# Patient Record
Sex: Female | Born: 1955
Health system: Southern US, Community
[De-identification: ages and names within clinical notes are randomized; demographics above are authoritative.]

## PROBLEM LIST (undated history)

## (undated) DIAGNOSIS — H04129 Dry eye syndrome of unspecified lacrimal gland: Secondary | ICD-10-CM

## (undated) DIAGNOSIS — F411 Generalized anxiety disorder: Secondary | ICD-10-CM

## (undated) DIAGNOSIS — M949 Disorder of cartilage, unspecified: Secondary | ICD-10-CM

## (undated) DIAGNOSIS — F3289 Other specified depressive episodes: Secondary | ICD-10-CM

## (undated) DIAGNOSIS — M899 Disorder of bone, unspecified: Secondary | ICD-10-CM

## (undated) DIAGNOSIS — C4491 Basal cell carcinoma of skin, unspecified: Secondary | ICD-10-CM

## (undated) DIAGNOSIS — Z8719 Personal history of other diseases of the digestive system: Secondary | ICD-10-CM

## (undated) DIAGNOSIS — D649 Anemia, unspecified: Secondary | ICD-10-CM

## (undated) DIAGNOSIS — D369 Benign neoplasm, unspecified site: Secondary | ICD-10-CM

## (undated) DIAGNOSIS — F329 Major depressive disorder, single episode, unspecified: Secondary | ICD-10-CM

## (undated) DIAGNOSIS — A0472 Enterocolitis due to Clostridium difficile, not specified as recurrent: Secondary | ICD-10-CM

## (undated) DIAGNOSIS — E785 Hyperlipidemia, unspecified: Secondary | ICD-10-CM

## (undated) DIAGNOSIS — K5090 Crohn's disease, unspecified, without complications: Secondary | ICD-10-CM

## (undated) DIAGNOSIS — F1010 Alcohol abuse, uncomplicated: Secondary | ICD-10-CM

## (undated) DIAGNOSIS — E039 Hypothyroidism, unspecified: Secondary | ICD-10-CM

## (undated) DIAGNOSIS — F1021 Alcohol dependence, in remission: Secondary | ICD-10-CM

## (undated) DIAGNOSIS — N823 Fistula of vagina to large intestine: Secondary | ICD-10-CM

## (undated) DIAGNOSIS — M810 Age-related osteoporosis without current pathological fracture: Secondary | ICD-10-CM

## (undated) HISTORY — DX: Alcohol dependence, in remission: F10.21

## (undated) HISTORY — DX: Generalized anxiety disorder: F41.1

## (undated) HISTORY — DX: Alcohol abuse, uncomplicated: F10.10

## (undated) HISTORY — DX: Crohn's disease, unspecified, without complications: K50.90

## (undated) HISTORY — DX: Personal history of other diseases of the digestive system: Z87.19

## (undated) HISTORY — PX: HEMORRHOID SURGERY: SHX153

## (undated) HISTORY — PX: ANAL FISSURE REPAIR: SHX2312

## (undated) HISTORY — DX: Other specified depressive episodes: F32.89

## (undated) HISTORY — PX: OTHER SURGICAL HISTORY: SHX169

## (undated) HISTORY — PX: CATARACT EXTRACTION: SUR2

## (undated) HISTORY — PX: TOTAL HIP ARTHROPLASTY: SHX124

## (undated) HISTORY — DX: Enterocolitis due to Clostridium difficile, not specified as recurrent: A04.72

## (undated) HISTORY — DX: Disorder of bone, unspecified: M89.9

## (undated) HISTORY — DX: Hypothyroidism, unspecified: E03.9

## (undated) HISTORY — DX: Disorder of cartilage, unspecified: M94.9

## (undated) HISTORY — DX: Basal cell carcinoma of skin, unspecified: C44.91

## (undated) HISTORY — DX: Age-related osteoporosis without current pathological fracture: M81.0

## (undated) HISTORY — DX: Dry eye syndrome of unspecified lacrimal gland: H04.129

## (undated) HISTORY — DX: Benign neoplasm, unspecified site: D36.9

## (undated) HISTORY — DX: Hyperlipidemia, unspecified: E78.5

## (undated) HISTORY — DX: Anemia, unspecified: D64.9

## (undated) HISTORY — DX: Fistula of vagina to large intestine: N82.3

## (undated) HISTORY — DX: Major depressive disorder, single episode, unspecified: F32.9

---

## 1987-10-13 HISTORY — PX: OTHER SURGICAL HISTORY: SHX169

## 1998-06-20 ENCOUNTER — Ambulatory Visit (HOSPITAL_COMMUNITY): Admission: RE | Admit: 1998-06-20 | Discharge: 1998-06-20 | Payer: Self-pay | Admitting: Internal Medicine

## 1998-12-24 ENCOUNTER — Other Ambulatory Visit: Admission: RE | Admit: 1998-12-24 | Discharge: 1998-12-24 | Payer: Self-pay | Admitting: Emergency Medicine

## 1999-12-29 ENCOUNTER — Other Ambulatory Visit: Admission: RE | Admit: 1999-12-29 | Discharge: 1999-12-29 | Payer: Self-pay | Admitting: Emergency Medicine

## 2000-01-02 ENCOUNTER — Encounter: Payer: Self-pay | Admitting: Emergency Medicine

## 2000-01-02 ENCOUNTER — Encounter: Admission: RE | Admit: 2000-01-02 | Discharge: 2000-01-02 | Payer: Self-pay | Admitting: Emergency Medicine

## 2000-01-12 ENCOUNTER — Encounter: Admission: RE | Admit: 2000-01-12 | Discharge: 2000-01-12 | Payer: Self-pay | Admitting: Emergency Medicine

## 2000-01-12 ENCOUNTER — Encounter: Payer: Self-pay | Admitting: Emergency Medicine

## 2002-05-31 ENCOUNTER — Ambulatory Visit (HOSPITAL_COMMUNITY): Admission: RE | Admit: 2002-05-31 | Discharge: 2002-05-31 | Payer: Self-pay | Admitting: Internal Medicine

## 2002-05-31 ENCOUNTER — Encounter: Payer: Self-pay | Admitting: Internal Medicine

## 2002-07-31 ENCOUNTER — Encounter: Payer: Self-pay | Admitting: Emergency Medicine

## 2002-07-31 ENCOUNTER — Encounter: Admission: RE | Admit: 2002-07-31 | Discharge: 2002-07-31 | Payer: Self-pay | Admitting: Emergency Medicine

## 2002-08-21 ENCOUNTER — Encounter: Payer: Self-pay | Admitting: Internal Medicine

## 2004-07-21 ENCOUNTER — Encounter: Admission: RE | Admit: 2004-07-21 | Discharge: 2004-07-21 | Payer: Self-pay | Admitting: Emergency Medicine

## 2005-05-03 ENCOUNTER — Emergency Department (HOSPITAL_COMMUNITY): Admission: EM | Admit: 2005-05-03 | Discharge: 2005-05-03 | Payer: Self-pay | Admitting: Emergency Medicine

## 2005-05-04 ENCOUNTER — Ambulatory Visit: Payer: Self-pay | Admitting: Psychiatry

## 2005-05-05 ENCOUNTER — Other Ambulatory Visit (HOSPITAL_COMMUNITY): Admission: RE | Admit: 2005-05-05 | Discharge: 2005-05-28 | Payer: Self-pay | Admitting: Psychiatry

## 2005-06-16 ENCOUNTER — Ambulatory Visit: Payer: Self-pay | Admitting: Internal Medicine

## 2005-06-19 ENCOUNTER — Ambulatory Visit: Payer: Self-pay | Admitting: Cardiology

## 2005-07-02 ENCOUNTER — Ambulatory Visit: Payer: Self-pay | Admitting: Internal Medicine

## 2005-07-30 ENCOUNTER — Ambulatory Visit: Payer: Self-pay | Admitting: Internal Medicine

## 2005-09-02 ENCOUNTER — Ambulatory Visit: Payer: Self-pay | Admitting: Internal Medicine

## 2005-11-05 ENCOUNTER — Ambulatory Visit: Payer: Self-pay | Admitting: Internal Medicine

## 2006-01-22 ENCOUNTER — Ambulatory Visit: Payer: Self-pay | Admitting: Internal Medicine

## 2006-02-08 ENCOUNTER — Ambulatory Visit: Payer: Self-pay | Admitting: Internal Medicine

## 2006-03-09 ENCOUNTER — Ambulatory Visit: Payer: Self-pay | Admitting: Internal Medicine

## 2006-03-12 ENCOUNTER — Inpatient Hospital Stay (HOSPITAL_COMMUNITY): Admission: AD | Admit: 2006-03-12 | Discharge: 2006-03-15 | Payer: Self-pay | Admitting: Internal Medicine

## 2006-03-12 ENCOUNTER — Ambulatory Visit: Payer: Self-pay | Admitting: Internal Medicine

## 2006-03-17 ENCOUNTER — Ambulatory Visit: Payer: Self-pay | Admitting: Internal Medicine

## 2006-03-18 ENCOUNTER — Ambulatory Visit: Payer: Self-pay | Admitting: Internal Medicine

## 2006-03-24 ENCOUNTER — Encounter (HOSPITAL_COMMUNITY): Admission: RE | Admit: 2006-03-24 | Discharge: 2006-06-22 | Payer: Self-pay | Admitting: Internal Medicine

## 2006-05-07 ENCOUNTER — Ambulatory Visit: Payer: Self-pay | Admitting: Internal Medicine

## 2006-06-29 ENCOUNTER — Encounter (HOSPITAL_COMMUNITY): Admission: RE | Admit: 2006-06-29 | Discharge: 2006-09-27 | Payer: Self-pay | Admitting: Internal Medicine

## 2006-08-10 ENCOUNTER — Ambulatory Visit: Payer: Self-pay | Admitting: Internal Medicine

## 2006-08-30 ENCOUNTER — Ambulatory Visit: Payer: Self-pay | Admitting: Internal Medicine

## 2006-08-30 ENCOUNTER — Encounter: Payer: Self-pay | Admitting: Internal Medicine

## 2006-09-01 ENCOUNTER — Encounter: Admission: RE | Admit: 2006-09-01 | Discharge: 2006-09-01 | Payer: Self-pay | Admitting: Emergency Medicine

## 2006-10-12 HISTORY — PX: MOHS SURGERY: SUR867

## 2006-11-16 ENCOUNTER — Encounter: Admission: RE | Admit: 2006-11-16 | Discharge: 2006-11-16 | Payer: Self-pay | Admitting: Rheumatology

## 2007-01-19 ENCOUNTER — Encounter: Payer: Self-pay | Admitting: Internal Medicine

## 2007-01-25 ENCOUNTER — Ambulatory Visit: Payer: Self-pay | Admitting: Internal Medicine

## 2007-04-27 ENCOUNTER — Encounter: Payer: Self-pay | Admitting: Internal Medicine

## 2007-05-23 ENCOUNTER — Ambulatory Visit: Payer: Self-pay | Admitting: Internal Medicine

## 2007-05-23 LAB — CONVERTED CEMR LAB
Albumin: 3.7 g/dL (ref 3.5–5.2)
Bilirubin, Direct: 0.2 mg/dL (ref 0.0–0.3)

## 2007-08-10 ENCOUNTER — Encounter: Payer: Self-pay | Admitting: Internal Medicine

## 2007-08-10 LAB — CONVERTED CEMR LAB
Cholesterol: 234 mg/dL
LDL (calc): 92 mg/dL
Platelets: 272 10*3/uL
WBC: 1.9 10*3/uL

## 2007-10-26 ENCOUNTER — Encounter: Admission: RE | Admit: 2007-10-26 | Discharge: 2007-10-26 | Payer: Self-pay | Admitting: Emergency Medicine

## 2007-10-26 ENCOUNTER — Encounter: Payer: Self-pay | Admitting: Internal Medicine

## 2008-07-30 DIAGNOSIS — F411 Generalized anxiety disorder: Secondary | ICD-10-CM

## 2008-07-30 DIAGNOSIS — F1021 Alcohol dependence, in remission: Secondary | ICD-10-CM | POA: Insufficient documentation

## 2008-07-30 DIAGNOSIS — IMO0002 Reserved for concepts with insufficient information to code with codable children: Secondary | ICD-10-CM | POA: Insufficient documentation

## 2008-07-30 DIAGNOSIS — F32A Depression, unspecified: Secondary | ICD-10-CM | POA: Insufficient documentation

## 2008-07-30 DIAGNOSIS — E039 Hypothyroidism, unspecified: Secondary | ICD-10-CM | POA: Insufficient documentation

## 2008-07-30 DIAGNOSIS — I10 Essential (primary) hypertension: Secondary | ICD-10-CM | POA: Insufficient documentation

## 2008-07-30 DIAGNOSIS — M810 Age-related osteoporosis without current pathological fracture: Secondary | ICD-10-CM | POA: Insufficient documentation

## 2008-07-30 DIAGNOSIS — M8580 Other specified disorders of bone density and structure, unspecified site: Secondary | ICD-10-CM | POA: Insufficient documentation

## 2008-07-30 DIAGNOSIS — E781 Pure hyperglyceridemia: Secondary | ICD-10-CM | POA: Insufficient documentation

## 2008-07-30 DIAGNOSIS — F419 Anxiety disorder, unspecified: Secondary | ICD-10-CM | POA: Insufficient documentation

## 2008-07-30 DIAGNOSIS — E785 Hyperlipidemia, unspecified: Secondary | ICD-10-CM | POA: Insufficient documentation

## 2008-07-30 DIAGNOSIS — F329 Major depressive disorder, single episode, unspecified: Secondary | ICD-10-CM

## 2008-07-30 DIAGNOSIS — N281 Cyst of kidney, acquired: Secondary | ICD-10-CM | POA: Insufficient documentation

## 2008-07-30 DIAGNOSIS — D649 Anemia, unspecified: Secondary | ICD-10-CM | POA: Insufficient documentation

## 2008-08-30 ENCOUNTER — Encounter: Payer: Self-pay | Admitting: Internal Medicine

## 2008-08-31 ENCOUNTER — Ambulatory Visit: Payer: Self-pay | Admitting: Internal Medicine

## 2008-08-31 DIAGNOSIS — Z8719 Personal history of other diseases of the digestive system: Secondary | ICD-10-CM | POA: Insufficient documentation

## 2008-09-03 ENCOUNTER — Encounter: Payer: Self-pay | Admitting: Internal Medicine

## 2008-09-03 LAB — CONVERTED CEMR LAB
ALT: 29 units/L (ref 0–35)
Alkaline Phosphatase: 74 units/L (ref 39–117)
BUN: 18 mg/dL (ref 6–23)
Basophils Absolute: 0.1 10*3/uL (ref 0.0–0.1)
CO2: 32 meq/L (ref 19–32)
Calcium: 9.9 mg/dL (ref 8.4–10.5)
Creatinine, Ser: 0.5 mg/dL (ref 0.4–1.2)
Eosinophils Relative: 1.2 % (ref 0.0–5.0)
GFR calc Af Amer: 167 mL/min
HCT: 35.4 % — ABNORMAL LOW (ref 36.0–46.0)
Iron: 146 ug/dL — ABNORMAL HIGH (ref 42–145)
Lymphocytes Relative: 15.7 % (ref 12.0–46.0)
Monocytes Absolute: 0.3 10*3/uL (ref 0.1–1.0)
Neutro Abs: 3.3 10*3/uL (ref 1.4–7.7)
Neutrophils Relative %: 73.9 % (ref 43.0–77.0)
Potassium: 4.4 meq/L (ref 3.5–5.1)
RDW: 13.2 % (ref 11.5–14.6)
Saturation Ratios: 36.5 % (ref 20.0–50.0)
Sodium: 138 meq/L (ref 135–145)
Total Protein: 7 g/dL (ref 6.0–8.3)
Transferrin: 285.6 mg/dL (ref >=212.0)

## 2008-09-19 ENCOUNTER — Ambulatory Visit (HOSPITAL_COMMUNITY): Admission: RE | Admit: 2008-09-19 | Discharge: 2008-09-19 | Payer: Self-pay | Admitting: Internal Medicine

## 2009-05-15 ENCOUNTER — Telehealth: Payer: Self-pay | Admitting: Internal Medicine

## 2009-05-16 ENCOUNTER — Telehealth: Payer: Self-pay | Admitting: Internal Medicine

## 2009-05-27 ENCOUNTER — Ambulatory Visit: Payer: Self-pay | Admitting: Internal Medicine

## 2009-05-27 ENCOUNTER — Telehealth: Payer: Self-pay | Admitting: Internal Medicine

## 2009-05-27 DIAGNOSIS — K5090 Crohn's disease, unspecified, without complications: Secondary | ICD-10-CM | POA: Insufficient documentation

## 2009-05-28 LAB — CONVERTED CEMR LAB
ALT: 18 units/L (ref 0–35)
AST: 21 units/L (ref 0–37)
Alkaline Phosphatase: 223 units/L — ABNORMAL HIGH (ref 39–117)
Basophils Absolute: 0 10*3/uL (ref 0.0–0.1)
Basophils Relative: 0.4 % (ref 0.0–3.0)
CRP, High Sensitivity: >80 — ABNORMAL HIGH (ref 0.00–5.00)
Chloride: 102 meq/L (ref 96–112)
Eosinophils Relative: 0.9 % (ref 0.0–5.0)
GFR calc non Af Amer: 79.75 mL/min (ref >60)
Glucose, Bld: 114 mg/dL — ABNORMAL HIGH (ref 70–99)
HCT: 39.1 % (ref 36.0–46.0)
Hemoglobin: 13.5 g/dL (ref 12.0–15.0)
Neutro Abs: 6.4 10*3/uL (ref 1.4–7.7)
Neutrophils Relative %: 73.3 % (ref 43.0–77.0)
Platelets: 456 10*3/uL — ABNORMAL HIGH (ref 150.0–400.0)
Total Protein: 7.7 g/dL (ref 6.0–8.3)

## 2009-06-20 ENCOUNTER — Ambulatory Visit: Payer: Self-pay | Admitting: Internal Medicine

## 2009-07-10 ENCOUNTER — Ambulatory Visit: Payer: Self-pay | Admitting: Internal Medicine

## 2009-07-16 ENCOUNTER — Ambulatory Visit: Payer: Self-pay | Admitting: Internal Medicine

## 2009-07-18 ENCOUNTER — Encounter: Payer: Self-pay | Admitting: Internal Medicine

## 2009-07-18 LAB — CONVERTED CEMR LAB
Cholesterol: 340 mg/dL — ABNORMAL HIGH (ref 0–200)
Direct LDL: 174.5 mg/dL
TSH: 0.08 microintl units/mL — ABNORMAL LOW (ref 0.35–5.50)
Total CHOL/HDL Ratio: 3

## 2009-07-29 ENCOUNTER — Encounter: Payer: Self-pay | Admitting: Internal Medicine

## 2009-08-05 ENCOUNTER — Telehealth: Payer: Self-pay | Admitting: Internal Medicine

## 2009-08-15 ENCOUNTER — Ambulatory Visit: Payer: Self-pay | Admitting: Internal Medicine

## 2009-08-16 ENCOUNTER — Encounter: Admission: RE | Admit: 2009-08-16 | Discharge: 2009-08-16 | Payer: Self-pay | Admitting: Internal Medicine

## 2009-08-28 ENCOUNTER — Ambulatory Visit: Payer: Self-pay | Admitting: Internal Medicine

## 2009-08-28 LAB — CONVERTED CEMR LAB: TSH: 3.22 microintl units/mL (ref 0.35–5.50)

## 2009-08-30 ENCOUNTER — Telehealth: Payer: Self-pay | Admitting: Internal Medicine

## 2009-08-30 ENCOUNTER — Encounter: Payer: Self-pay | Admitting: Internal Medicine

## 2009-10-16 ENCOUNTER — Ambulatory Visit: Payer: Self-pay | Admitting: Internal Medicine

## 2009-12-27 ENCOUNTER — Encounter (INDEPENDENT_AMBULATORY_CARE_PROVIDER_SITE_OTHER): Payer: Self-pay | Admitting: *Deleted

## 2010-02-14 ENCOUNTER — Ambulatory Visit: Payer: Self-pay | Admitting: Internal Medicine

## 2010-02-14 DIAGNOSIS — M25569 Pain in unspecified knee: Secondary | ICD-10-CM | POA: Insufficient documentation

## 2010-02-17 LAB — CONVERTED CEMR LAB: TSH: 8.59 microintl units/mL — ABNORMAL HIGH (ref 0.35–5.50)

## 2010-10-12 DIAGNOSIS — N823 Fistula of vagina to large intestine: Secondary | ICD-10-CM

## 2010-10-12 HISTORY — DX: Fistula of vagina to large intestine: N82.3

## 2010-10-22 ENCOUNTER — Ambulatory Visit
Admission: RE | Admit: 2010-10-22 | Discharge: 2010-10-22 | Payer: Self-pay | Source: Home / Self Care | Attending: Internal Medicine | Admitting: Internal Medicine

## 2010-10-22 ENCOUNTER — Other Ambulatory Visit: Payer: Self-pay | Admitting: Internal Medicine

## 2010-10-22 LAB — CBC WITH DIFFERENTIAL/PLATELET
Basophils Absolute: 0 10*3/uL (ref 0.0–0.1)
Basophils Relative: 1 % (ref 0.0–3.0)
Eosinophils Absolute: 0.1 10*3/uL (ref 0.0–0.7)
Eosinophils Relative: 1.9 % (ref 0.0–5.0)
HCT: 39.3 % (ref 36.0–46.0)
Hemoglobin: 13.8 g/dL (ref 12.0–15.0)
Lymphocytes Relative: 29.4 % (ref 12.0–46.0)
Lymphs Abs: 0.8 10*3/uL (ref 0.7–4.0)
MCHC: 35.2 g/dL (ref 30.0–36.0)
MCV: 111.4 fl — ABNORMAL HIGH (ref 78.0–100.0)
Monocytes Absolute: 0.3 10*3/uL (ref 0.1–1.0)
Monocytes Relative: 10 % (ref 3.0–12.0)
Neutro Abs: 1.5 10*3/uL (ref 1.4–7.7)
Neutrophils Relative %: 57.7 % (ref 43.0–77.0)
Platelets: 286 10*3/uL (ref 150.0–400.0)
RBC: 3.53 Mil/uL — ABNORMAL LOW (ref 3.87–5.11)
RDW: 13.7 % (ref 11.5–14.6)
WBC: 2.6 10*3/uL — ABNORMAL LOW (ref 4.5–10.5)

## 2010-10-22 LAB — COMPREHENSIVE METABOLIC PANEL
ALT: 25 U/L (ref 0–35)
AST: 44 U/L — ABNORMAL HIGH (ref 0–37)
Albumin: 4.3 g/dL (ref 3.5–5.2)
Alkaline Phosphatase: 61 U/L (ref 39–117)
BUN: 12 mg/dL (ref 6–23)
CO2: 30 mEq/L (ref 19–32)
Calcium: 10.3 mg/dL (ref 8.4–10.5)
Chloride: 105 mEq/L (ref 96–112)
Creatinine, Ser: 0.6 mg/dL (ref 0.4–1.2)
GFR: 122.24 mL/min (ref >60.00)
Glucose, Bld: 98 mg/dL (ref 70–99)
Potassium: 4.7 mEq/L (ref 3.5–5.1)
Sodium: 143 mEq/L (ref 135–145)
Total Bilirubin: 0.8 mg/dL (ref 0.3–1.2)
Total Protein: 7.4 g/dL (ref 6.0–8.3)

## 2010-10-22 LAB — SEDIMENTATION RATE: Sed Rate: 20 mm/hr (ref 0–22)

## 2010-10-22 LAB — VITAMIN B12: Vitamin B-12: 836 pg/mL (ref 211–911)

## 2010-11-11 NOTE — Assessment & Plan Note (Signed)
Summary: F/U OV...LSW.    History of Present Illness Visit Type: Follow-up Visit Primary GI MD: Delfin Edis MD Primary Provider: Rowe Clack MD Requesting Provider: NA Chief Complaint: F/U Crohn's flare, patient has no problems now History of Present Illness:   This is a 55 year old white female with known Crohn's disease of the rectum, colon and terminal ileum status post flareup in September 2010 which responded to a prednisone taper. She continues Imuran 100 mg daily and discontinued her prednisone one week ago. She a questionable rectovaginal fistula which has been rather stable and is not currently a problem. She has no complaints today.  Her ast appointment with [place] was on 08/15/09. She drinks excess alcohol on a daily basis, drinking two thirds of a bottle of wine daily. She has been constipated.   GI Review of Systems      Denies abdominal pain, acid reflux, belching, bloating, chest pain, dysphagia with liquids, dysphagia with solids, heartburn, loss of appetite, nausea, vomiting, vomiting blood, weight loss, and  weight gain.        Denies anal fissure, black tarry stools, change in bowel habit, constipation, diarrhea, diverticulosis, fecal incontinence, heme positive stool, hemorrhoids, irritable bowel syndrome, jaundice, light color stool, liver problems, rectal bleeding, and  rectal pain.    Current Medications (verified): 1)  Azasan 100 Mg  Tabs (Azathioprine) .... Take 1 Tablet By Mouth Once A Day. 2)  Effexor Xr 150 Mg Xr24h-Cap (Venlafaxine Hcl) .Marland Kitchen.. 1 By Mouth  Every Morning 3)  Valtrex 500 Mg Tabs (Valacyclovir Hcl) .... Once Daily 4)  Boniva 150 Mg Tabs (Ibandronate Sodium) .... Take Every 2-3 Months 5)  Vitamin B-12 500 Mcg Tabs (Cyanocobalamin) .... Once Daily 6)  Chelated Potassium 99 Mg Tabs (Potassium) .... Once Daily 7)  Lorazepam 0.5 Mg Tabs (Lorazepam) .... Take 1 Tablet By Mouth Once Daily As Needed 8)  Levothroid 50 Mcg Tabs (Levothyroxine Sodium) ....  Take 1 By Mouth Qd 9)  Lipitor 20 Mg Tabs (Atorvastatin Calcium) .Marland Kitchen.. 1 By Mouth At Bedtime  Allergies (verified): No Known Drug Allergies  Past History:  Past Medical History: Reviewed history from 07/10/2009 and no changes required. basal cell cancer of the nose RENAL CYST  DEGENERATIVE DISC DISEASE OSTEOPENIA  HYPERTENSION HYPOTHYROIDISM DEPRESSION  ANXIETY DISORDER  ALCOHOL ABUSE, HX OF  HYPERLIPIDEMIA ANEMIA CROHN'S DISEASE   Past Surgical History: Reviewed history from 08/15/2009 and no changes required. anal fissure repair Hemorrhoidectomy theraputic abortion Mohs surg -nose BCC (2008) left thumb surgery nail bed 1989  Review of Systems       The patient complains of back pain, depression-new, and night sweats.         Pertinent positive and negative review of systems were noted in the above HPI. All other ROS was otherwise negative.   Vital Signs:  Patient profile:   55 year old female Height:      60 inches Weight:      116.13 pounds Pulse rate:   72 / minute Pulse rhythm:   regular BP sitting:   112 / 82  (left arm) Cuff size:   regular  Vitals Entered By: June McMurray [place] Deborra Medina) (October 16, 2009 9:45 AM)  Physical Exam  General:  spider nevi on the face. Eyes:  nonicteric. Neck:  Supple; no masses or thyromegaly. Lungs:  Clear throughout to auscultation. Heart:  Regular rate and rhythm; no murmurs, rubs,  or bruits. Abdomen:  soft abdomen which is nontender without normoactive bowel sounds. Liver  edge at costal margin. Right lower quadrant and left lower quadrant unremarkable. Rectal:  deferred. Extremities:  no pedal edema. Neurologic:  fine resting tremor. Skin:  palmar erythema. Psych:  Alert and cooperative. Normal mood and affect.   Impression & Recommendations:  Problem # 1:  CROHN'S DISEASE (ICD-555.9) Patient has Crohn's colitis with rectal disease and terminal ileum involvement. She is currently in remission on Imuran 100 mg  daily. She will continue the same regimen and we will see her in 3 months.  Problem # 2:  ALCOHOL ABUSE, HX OF (ICD-V11.3) Patient continues with excessive alcohol use and apparently has no intention of stopping. The past evaluation of the liver did not show any evidence of hepatitis, portal hypertension or cirrhosis.  Patient Instructions: 1)  continue Imuran 100 mg daily. 2)  Continue Analpram cream 2.5% p.r.n. rectal irritation. 3)  Office visit 3 months. 4)  Copy sent to : Dr Asa Lente

## 2010-11-11 NOTE — Letter (Signed)
Summary: Colonoscopy Date Change Letter  [facility]  [place], [place] [address]   Phone: [phone]  Fax: [phone]      December 27, 2009 MRN: [id number]   MARIANNY GORIS [address] [place], [place]  [address]   Dear Ms. Piano,   Previously you were recommended to have a repeat colonoscopy around this time. Your chart was recently reviewed by Dr. Lowella Bandy. Olevia Perches of [facility]. Follow up colonoscopy is now recommended in April 2012. This revised recommendation is based on current, nationally recognized guidelines for colorectal cancer screening and polyp surveillance. These guidelines are endorsed by the [place] Task Force on Colorectal Cancer as well as numerous other major medical organizations.  Please understand that our recommendation assumes that you do not have any new symptoms such as bleeding, a change in bowel habits, anemia, or significant abdominal discomfort. If you do have any concerning GI symptoms or want to discuss the guideline recommendations, please call to arrange an office visit at your earliest convenience. Otherwise we will keep you in our reminder system and contact you 1-2 months prior to the date listed above to schedule your next colonoscopy.  Thank you,  Lowella Bandy. Olevia Perches, M.D.  [facility] [phone]

## 2010-11-11 NOTE — Assessment & Plan Note (Signed)
Summary: knee problem,thyroid problem/cd   Vital Signs:  Patient profile:   55 year old female Height:      60 inches (152.40 cm) Weight:      123.8 pounds (56.27 kg) O2 Sat:      97 % on Room air Temp:     99.1 degrees F (37.28 degrees C) oral Pulse rate:   95 / minute BP sitting:   110 / 82  (left arm) Cuff size:   regular  Vitals Entered By: Tomma Lightning (Feb 14, 2010 3:40 PM)  O2 Flow:  Room air CC: (L) Knee problem, also want to discuss her thyroid and cholestol. Pt states she stop taking the lipitor due to side effects Is Patient Diabetic? No Pain Assessment Patient in pain? no        Primary Care Provider:  Rowe Clack MD  CC:  (L) Knee problem and also want to discuss her thyroid and cholestol. Pt states she stop taking the lipitor due to side effects.  History of Present Illness: c/o L knee pain - onset 1 year ago  precipitated by fall injury 1 year ago when slipped in shower and twisted it occ swelling, none at thsis time pain flare 1/2 weeks, lasts 2-3 days - pain and swelling improved by ice and elevation - also improved with ibuprofen  dyslipidemia -  intol of crestor - caused constipation which has improved since off med -  also retried on lipitor on 08/2009 visit - also intol of vytorin - repeat lipitor caused constipation   crohns - tapering pred saw GI to manage same - no new bowel symptoms   hypothyroid - dec dose 6 weeks ago as directed due to low TSH - no adv SE - compliant with new dose as rx;'d ?if aromour thyroid is safer or more effectiove  Clinical Review Panels:  Lipid Management   Cholesterol:  340 (07/16/2009)   HDL (good cholesterol):  98.60 (07/16/2009)   Triglycerides:  327 (08/10/2007)   Current Medications (verified): 1)  Azasan 100 Mg  Tabs (Azathioprine) .... Take 1 Tablet By Mouth Once A Day. 2)  Effexor Xr 150 Mg Xr24h-Cap (Venlafaxine Hcl) .Marland Kitchen.. 1 By Mouth  Every Morning 3)  Valtrex 500 Mg Tabs (Valacyclovir Hcl)  .... Once Daily 4)  Boniva 150 Mg Tabs (Ibandronate Sodium) .... Take Every 2-3 Months 5)  Vitamin B-12 500 Mcg Tabs (Cyanocobalamin) .... Once Daily 6)  Chelated Potassium 99 Mg Tabs (Potassium) .... Once Daily 7)  Lorazepam 0.5 Mg Tabs (Lorazepam) .... Take 1 Tablet By Mouth Once Daily As Needed 8)  Levothroid 50 Mcg Tabs (Levothyroxine Sodium) .... Take 1 By Mouth Qd  Allergies (verified): No Known Drug Allergies  Past History:  Past Medical History: basal cell cancer of the nose RENAL CYST  DEGENERATIVE DISC DISEASE OSTEOPENIA  HYPERTENSION HYPOTHYROIDISM DEPRESSION  ANXIETY DISORDER  ALCOHOL ABUSE, HX OF  HYPERLIPIDEMIA ANEMIA CROHN'S DISEASE   MD rooster: GI - brodie  Review of Systems  The patient denies anorexia, fever, headaches, difficulty walking, and depression.    Physical Exam  General:  alert, well-developed but frail; cooperative to examination.    Lungs:  normal respiratory effort, no intercostal retractions or use of accessory muscles; normal breath sounds bilaterally - no crackles and no wheezes.    Heart:  normal rate, regular rhythm, no murmur, and no rub. BLE without edema.  Msk:  left knee: decreased range of motion, diffuse boggy synovitis. Tender to palpation on joint  line. Positive crepitus.    Impression & Recommendations:  Problem # 1:  KNEE PAIN, LEFT (ICD-719.46)  Orders: T-DG Knee Bilateral Standing AP (42876) T-Knee Left 2 view (73560TC)  Discussed strengthening exercises, use of ice or heat, and medications.   Problem # 2:  HYPERLIPIDEMIA (ICD-272.4)  intol of statins - try niacin The following medications were removed from the medication list:    Lipitor 20 Mg Tabs (Atorvastatin calcium) .Marland Kitchen... 1 by mouth at bedtime Her updated medication list for this problem includes:    Niacor 500 Mg Tabs (Niacin) .Marland Kitchen... 1 by mouth at bedtime x 7 days, then 2 by mouth at bedtime (or as directed)  Labs Reviewed: SGOT: 21 (05/27/2009)    SGPT: 18 (05/27/2009)   HDL:98.60 (07/16/2009), 77 (08/10/2007)  LDL:92 (08/10/2007)  Chol:340 (07/16/2009), 234 (08/10/2007)  Trig:617.0 (07/16/2009), 327 (08/10/2007)  Orders: Prescription Created Electronically [phone])  Problem # 3:  HYPOTHYROIDISM (ICD-244.9) consider "natural" thyroid if TSH is not in balance vs endo refer to advise on same Her updated medication list for this problem includes:    Levothroid 50 Mcg Tabs (Levothyroxine sodium) .Marland Kitchen... Take 1 by mouth qd  Orders: TLB-TSH (Thyroid Stimulating Hormone) (84443-TSH)  Labs Reviewed: TSH: 3.22 (08/28/2009)    Chol: 340 (07/16/2009)   HDL: 98.60 (07/16/2009)   LDL: 92 (08/10/2007)   TG: 617.0 (07/16/2009)  Problem # 4:  CROHN'S DISEASE (ICD-555.9)  Patient has Crohn's colitis with rectal disease and terminal ileum involvement. She is currently in remission on Imuran 100 mg daily. She will continue the same regimen and we will see her in 3 months.  Complete Medication List: 1)  Azasan 100 Mg Tabs (Azathioprine) .... Take 1 tablet by mouth once a day. 2)  Effexor Xr 150 Mg Xr24h-cap (Venlafaxine hcl) .Marland Kitchen.. 1 by mouth  every morning 3)  Valtrex 500 Mg Tabs (Valacyclovir hcl) .... Once daily 4)  Boniva 150 Mg Tabs (Ibandronate sodium) .... Take every 2-3 months 5)  Vitamin B-12 500 Mcg Tabs (Cyanocobalamin) .... Once daily 6)  Chelated Potassium 99 Mg Tabs (Potassium) .... Once daily 7)  Lorazepam 0.5 Mg Tabs (Lorazepam) .... Take 1 tablet by mouth once daily as needed 8)  Levothroid 50 Mcg Tabs (Levothyroxine sodium) .... Take 1 by mouth qd 9)  Niacor 500 Mg Tabs (Niacin) .Marland Kitchen.. 1 by mouth at bedtime x 7 days, then 2 by mouth at bedtime (or as directed)  Patient Instructions: 1)  it was good to see you today.  2)  test(s) ordered today - your results will be posted on the phone tree for review in 48-72 hours from the time of test completion; call [phone] and enter your 9 digit MRN (listed above on this page, just below  your name); if any changes need to be made or there are abnormal results, you will be contacted directly.  3)  start Niacor (niacin) for your cholesterol - your prescription has been electronically submitted to your pharmacy. Please take as directed. Contact our office if you believe you're having problems with the medication(s).  4)  Please schedule a follow-up appointment in 3-4 months to review cholesterol, thyroid and knee - call sooner if problems.  Prescriptions: NIACOR 500 MG TABS (NIACIN) 1 by mouth at bedtime x 7 days, then 2 by mouth at bedtime (or as directed)  #60 x 5   Entered and Authorized by:   Rowe Clack MD   Signed by:   Rowe Clack MD on 02/14/2010   Method  used:   Electronically to        [facility] (retail)       [address]       [place], [place]  [address]       Ph: [phone] or [phone]       Fax: [phone]   RxID:   [id number]

## 2010-11-13 NOTE — Assessment & Plan Note (Signed)
Summary: F/U CROHNS..JJ.    History of Present Illness Visit Type: Follow-up Visit Primary GI MD: Delfin Edis MD Primary Provider: Rowe Clack MD Requesting Provider: NA Chief Complaint: F/u for crohn's. Pt c/o constipation  History of Present Illness:   This is 55 year old white female with Crohn's disease of the rectum, colon and terminal ileum of at least 20 years duration. Her last flareup in September 2010 responded to a prednisone taper. She has been on Imuran 100 mg daily and a B12 supplement. She has a questionable rectovaginal fistula which is currently asymptomatic and stable. She took Cipro for it in the past. She drinks a large amount of alcohol daily; about two thirds of the large bottle daily. Other medical problems include degenerative disc disease, hypothyroidism, COPD as well as anxiety and hyperlipidemia. Her last colonoscopy in April 2007 showed rectal granularity and ulcerations and pseudopolyps. She had minimal disease in the terminal ileum with biopsies showing normal ileal mucosa. Biopsies from the rectum confirmed chronic active inflammation. She is due for a colonoscopy in April 2012.   GI Review of Systems      Denies abdominal pain, acid reflux, belching, bloating, chest pain, dysphagia with liquids, dysphagia with solids, heartburn, loss of appetite, nausea, vomiting, vomiting blood, weight loss, and  weight gain.      Reports constipation.     Denies anal fissure, black tarry stools, change in bowel habit, diarrhea, diverticulosis, fecal incontinence, heme positive stool, hemorrhoids, irritable bowel syndrome, jaundice, light color stool, liver problems, rectal bleeding, and  rectal pain.    Current Medications (verified): 1)  Azasan 100 Mg  Tabs (Azathioprine) .... Take 1 Tablet By Mouth Once A Day. 2)  Effexor Xr 150 Mg Xr24h-Cap (Venlafaxine Hcl) .Marland Kitchen.. 1 By Mouth  Every Morning 3)  Valtrex 500 Mg Tabs (Valacyclovir Hcl) .... Once Daily 4)  Boniva 150 Mg  Tabs (Ibandronate Sodium) .... Take Every 2-3 Months 5)  Vitamin B-12 500 Mcg Tabs (Cyanocobalamin) .... Once Daily 6)  Chelated Potassium 99 Mg Tabs (Potassium) .... Once Daily 7)  Lorazepam 0.5 Mg Tabs (Lorazepam) .... Take 1 Tablet By Mouth Once Daily As Needed 8)  Levoxyl 75 Mcg Tabs (Levothyroxine Sodium) .... One Tablet By Mouth Once Daily 9)  Ex-Lax 15 Mg Tabs (Sennosides) .... As Needed  Allergies (verified): No Known Drug Allergies  Past History:  Past Medical History: basal cell cancer of the nose KNEE PAIN, LEFT (ICD-719.46) DRY EYE SYNDROME (ICD-375.15) HYPERLIPIDEMIA (ICD-272.4) ANXIETY DISORDER (ICD-300.00) DEPRESSION (ICD-311) HYPOTHYROIDISM (ICD-244.9) OSTEOPENIA (ICD-733.90) CROHN'S DISEASE (ICD-555.9) ANEMIA (ICD-285.9) DEGENERATIVE DISC DISEASE (ICD-722.6) HEPATOMEGALY, HX OF (ICD-V12.79) RENAL CYST (ICD-593.2) ENTERITIS (ICD-558.9) HYPERTENSION (ICD-401.9) ALCOHOL ABUSE, HX OF (ICD-V11.3)    MD rooster: GI - Jahmar Mckelvy  Past Surgical History: Reviewed history from 08/15/2009 and no changes required. anal fissure repair Hemorrhoidectomy theraputic abortion Mohs surg -nose BCC (2008) left thumb surgery nail bed 1989  Family History: Reviewed history from 07/10/2009 and no changes required. Patient adopted  - unknown  Social History: Reviewed history from 08/15/2009 and no changes required. works at [employer] -part time Patient has never smoked.  Alcohol Use - yes. 4 glasses per night Illicit Drug Use - no Patient does not get regular exercise.  Daily Caffeine Use  Review of Systems  The patient denies allergy/sinus, anemia, anxiety-new, arthritis/joint pain, back pain, blood in urine, breast changes/lumps, change in vision, confusion, cough, coughing up blood, depression-new, fainting, fatigue, fever, headaches-new, hearing problems, heart murmur, heart rhythm changes,  itching, menstrual pain, muscle  pains/cramps, night sweats, nosebleeds, pregnancy symptoms, shortness of breath, skin rash, sleeping problems, sore throat, swelling of feet/legs, swollen lymph glands, thirst - excessive , urination - excessive , urination changes/pain, urine leakage, vision changes, and voice change.         Pertinent positive and negative review of systems were noted in the above HPI. All other ROS was otherwise negative.   Vital Signs:  Patient profile:   55 year old female Height:      60 inches Weight:      118 pounds BMI:     23.13 BSA:     1.49 Pulse rate:   88 / minute Pulse rhythm:   regular BP sitting:   122 / 64  (left arm) Cuff size:   regular  Vitals Entered By: Hope Pigeon CMA (October 22, 2010 8:35 AM)  Physical Exam  General:  alert and oriented. Eyes:  nonicteric. Mouth:  no aphthous ulcers. Neck:  Supple; no masses or thyromegaly. Lungs:  Clear throughout to auscultation. Heart:  Regular rate and rhythm; no murmurs, rubs,  or bruits. Abdomen:  soft, nontender with normoactive bowel sounds. No distention. Liver edge at costal margin. No ascites. Splenic tip not palpable. Rectal:  normal perianal area. Irregular anal canal with no tenderness. Irregular rectal mucosa and stool which was Hemoccult-negative. There was no pain on digital exam. Extremities:  No clubbing, cyanosis, edema or deformities noted. Skin:  spider nevi on the face. Psych:  Alert and cooperative. Normal mood and affect.   Impression & Recommendations:  Problem # 1:  CROHN'S DISEASE (ICD-555.9) Patient has stable Crohn's disease of the rectum and is doing well on Imuran 100 mg daily. We plan to repeat labs today and every 6 months. She is to continue on Imuran and Analpram cream p.r.n. Orders: DEXA Bone Density Scan (DEXA Scan) TLB-CBC Platelet - w/Differential (85025-CBCD) TLB-CMP (Comprehensive Metabolic Pnl) (29476-LYYT) TLB-Sedimentation Rate (ESR) (85652-ESR) TLB-B12, Serum-Total ONLY  (03546-F68)  Problem # 2:  OSTEOPENIA (ICD-733.90) Because of the history of steroid use, we will order a bone density. She would like to have this at the time of her mammogram. Orders: DEXA Bone Density Scan (DEXA Scan)  Problem # 3:  ALCOHOL ABUSE, HX OF (ICD-V11.3) Patient has continued high-volume alcohol intake without intentions of stopping. We are rechecking her liver function tests today. Again, I encouraged her to reduce her alcohol intake.  Patient Instructions: 1)  Please pick up your prescriptions at the pharmacy. Electronic prescription(s) has already been sent for Imuran. 2)  Your physician requests that you go to the basement floor of our office to have the following labwork completed before leaving today: CBC, CMET, B12 level and Sedimentation Rate. 3)  You will be due for labs around July 2012. We will call you to remind you of this when it gets closer to that time. 4)  We have given you orders for a bone density scan per your request. 5)  You will be due for a recall colonoscopy April 2012 6)  Please schedule a follow-up appointment in 1 year. 7)  Copy sent to : Dr Rowan Blase 8)  The medication list was reviewed and reconciled.  All changed / newly prescribed medications were explained.  A complete medication list was provided to the patient / caregiver. Prescriptions: AZASAN 100 MG  TABS (AZATHIOPRINE) Take 1 tablet by mouth once a day.  #30 x 9   Entered by:   Madlyn Frankel CMA (AAMA)  Authorized by:   Lafayette Dragon MD   Signed by:   Madlyn Frankel CMA ([place]) on 10/22/2010   Method used:   Electronically to        [id number] (retail)       [address]       [place], [place]  [address]       Ph: [phone]       Fax: [phone]   RxID:   [id number]

## 2010-12-09 ENCOUNTER — Encounter: Payer: Self-pay | Admitting: Internal Medicine

## 2010-12-15 ENCOUNTER — Telehealth: Payer: Self-pay | Admitting: Internal Medicine

## 2010-12-18 NOTE — Miscellaneous (Signed)
Summary: Bone Density  ---- 12/08/2010 2:12 PM, Dottie Nelson-Smith CMA (AAMA) wrote: Juluis Rainier, Patient was to get a bone density scan @ time of her mammogram. I have spoken to her on a couple of occasions and she has not had either test completed that I know of. I am going to take this test off my log.Marland KitchenMarland KitchenMarland KitchenMarland Kitchenjust so you are aware  ---- 12/08/2010 9:16 PM, Lafayette Dragon MD wrote: I agree. Her PCP ought to keep that up.

## 2010-12-23 NOTE — Progress Notes (Signed)
Summary: Rx refill req  Phone Note Refill Request Message from:  Patient on December 15, 2010 2:03 PM  Refills Requested: Medication #1:  LEVOXYL 75 MCG TABS one tablet by mouth once daily   Dosage confirmed as above?Dosage Confirmed   Supply Requested: 3 months Pt has appt 01/14/2011 CPX   Method Requested: Electronic Initial call taken by: Crissie Sickles, CMA,  December 15, 2010 2:04 PM    Prescriptions: LEVOXYL 75 MCG TABS (LEVOTHYROXINE SODIUM) one tablet by mouth once daily  #30 Tablet x 1   Entered by:   Crissie Sickles, CMA   Authorized by:   Rowe Clack MD   Signed by:   Crissie Sickles, CMA on 12/15/2010   Method used:   Electronically to        [id number]* (retail)       [address]       [place], [place]  [address]       Ph: [phone]       Fax: [phone]   RxID:   [id number]

## 2011-01-07 ENCOUNTER — Other Ambulatory Visit: Payer: Self-pay | Admitting: *Deleted

## 2011-01-07 ENCOUNTER — Other Ambulatory Visit (INDEPENDENT_AMBULATORY_CARE_PROVIDER_SITE_OTHER): Payer: BC Managed Care – PPO

## 2011-01-07 ENCOUNTER — Other Ambulatory Visit (INDEPENDENT_AMBULATORY_CARE_PROVIDER_SITE_OTHER): Payer: BC Managed Care – PPO | Admitting: Internal Medicine

## 2011-01-07 DIAGNOSIS — Z0000 Encounter for general adult medical examination without abnormal findings: Secondary | ICD-10-CM

## 2011-01-07 DIAGNOSIS — Z13220 Encounter for screening for lipoid disorders: Secondary | ICD-10-CM

## 2011-01-07 LAB — BASIC METABOLIC PANEL
BUN: 11 mg/dL (ref 6–23)
CO2: 30 mEq/L (ref 19–32)
Chloride: 103 mEq/L (ref 96–112)
Creatinine, Ser: 0.6 mg/dL (ref 0.4–1.2)
GFR: 106.37 mL/min (ref >60.00)
Glucose, Bld: 74 mg/dL (ref 70–99)
Potassium: 4.3 mEq/L (ref 3.5–5.1)

## 2011-01-07 LAB — URINALYSIS, ROUTINE W REFLEX MICROSCOPIC
Ketones, ur: NEGATIVE
Total Protein, Urine: NEGATIVE
Urobilinogen, UA: 0.2 (ref 0.0–1.0)

## 2011-01-07 LAB — CBC WITH DIFFERENTIAL/PLATELET
Basophils Relative: 1.2 % (ref 0.0–3.0)
Eosinophils Absolute: 0 10*3/uL (ref 0.0–0.7)
Lymphocytes Relative: 30.1 % (ref 12.0–46.0)
Monocytes Relative: 19 % — ABNORMAL HIGH (ref 3.0–12.0)
Neutro Abs: 0.9 10*3/uL — ABNORMAL LOW (ref 1.4–7.7)
Platelets: 189 10*3/uL (ref 150.0–400.0)
RBC: 3.49 Mil/uL — ABNORMAL LOW (ref 3.87–5.11)
RDW: 13.2 % (ref 11.5–14.6)

## 2011-01-07 LAB — HEPATIC FUNCTION PANEL
ALT: 22 U/L (ref 0–35)
Albumin: 4.1 g/dL (ref 3.5–5.2)
Total Protein: 6.9 g/dL (ref 6.0–8.3)

## 2011-01-07 LAB — LIPID PANEL
HDL: 93.6 mg/dL (ref >39.00)
Total CHOL/HDL Ratio: 4

## 2011-01-07 LAB — LDL CHOLESTEROL, DIRECT: Direct LDL: 181.8 mg/dL

## 2011-01-10 ENCOUNTER — Encounter: Payer: Self-pay | Admitting: Internal Medicine

## 2011-01-14 ENCOUNTER — Ambulatory Visit (INDEPENDENT_AMBULATORY_CARE_PROVIDER_SITE_OTHER): Payer: BC Managed Care – PPO | Admitting: Internal Medicine

## 2011-01-14 ENCOUNTER — Encounter: Payer: Self-pay | Admitting: Internal Medicine

## 2011-01-14 VITALS — BP 110/78 | HR 76 | Temp 98.4°F | Ht 60.0 in | Wt 117.8 lb

## 2011-01-14 DIAGNOSIS — Z0000 Encounter for general adult medical examination without abnormal findings: Secondary | ICD-10-CM | POA: Insufficient documentation

## 2011-01-14 DIAGNOSIS — K5090 Crohn's disease, unspecified, without complications: Secondary | ICD-10-CM

## 2011-01-14 DIAGNOSIS — M949 Disorder of cartilage, unspecified: Secondary | ICD-10-CM

## 2011-01-14 DIAGNOSIS — E785 Hyperlipidemia, unspecified: Secondary | ICD-10-CM

## 2011-01-14 DIAGNOSIS — M899 Disorder of bone, unspecified: Secondary | ICD-10-CM

## 2011-01-14 DIAGNOSIS — E039 Hypothyroidism, unspecified: Secondary | ICD-10-CM

## 2011-01-14 MED ORDER — LEVOTHYROXINE SODIUM 50 MCG PO TABS: 50.0000 ug | ORAL_TABLET | ORAL | Status: DC

## 2011-01-14 MED ORDER — VENLAFAXINE HCL ER 150 MG PO CP24: 150.0000 mg | ORAL_CAPSULE | ORAL | Status: DC

## 2011-01-14 MED ORDER — LEVOTHYROXINE SODIUM 75 MCG PO TABS: 75.0000 ug | ORAL_TABLET | Freq: Every day | ORAL | Status: DC

## 2011-01-14 MED ORDER — VALACYCLOVIR HCL 500 MG PO TABS: 500.0000 mg | ORAL_TABLET | Freq: Every day | ORAL | Status: DC

## 2011-01-14 NOTE — Assessment & Plan Note (Signed)
Intolerant of statin therapy (constipation SE) and declines other rx therapy - Current labs reviewed; Continue diet/exercise efforts to control same - risks of ASD reviewed

## 2011-01-14 NOTE — Assessment & Plan Note (Signed)
Lab Results  Component Value Date   TSH 0.25* 01/07/2011   Will reduce weekly dosing of replacement - new erx done recheck labs 3 months and OV 6 mo, sooner if problems or symptoms

## 2011-01-14 NOTE — Assessment & Plan Note (Signed)
Patient has been counseled on age-appropriate routine health concerns for screening and prevention. These are reviewed and up-to-date. Immunizations are up-to-date or declined. Labs and ECG reviewed.

## 2011-01-14 NOTE — Assessment & Plan Note (Signed)
Pt reports DEXA scheduled this spring with mammo - Continue boniva + dietary Ca+D

## 2011-01-14 NOTE — Assessment & Plan Note (Signed)
Complicated hx, currently stable - follows with GI regularly The current medical regimen is effective;  continue present plan and medications.

## 2011-01-14 NOTE — Progress Notes (Signed)
Subjective:    Patient ID: Tammy Kennedy, female    DOB: 09/29/1956, 55 y.o.   MRN: [id number]  HPI patient is here today for annual physical. Patient feels well and has no complaints.  Also reviewed chronic medical issues: occassional L knee pain - onset >1 year ago  precipitated by fall injury when slipped in shower and twisted it occ swelling, none at this time; pain flare every 3-4 weeks, lasts 2-3 days - pain and swelling improved by ice and elevation - also improved with ibuprofen  dyslipidemia -  intol of crestor - caused constipation which has improved since off med -  also retried on lipitor on 08/2009 visit - also intol of vytorin - repeat lipitor caused constipation   crohns - tapering pred - the patient reports compliance with medication(s) as prescribed. Denies adverse side effects. saw GI to manage same - no new bowel symptoms   hypothyroid - the patient reports compliance with medication(s) as prescribed. Denies adverse side effects. ?if aromour thyroid is safer or more effective  Past Medical History  Diagnosis Date  . HYPOTHYROIDISM 07/30/2008  . HYPERLIPIDEMIA 07/30/2008  . ANEMIA 07/30/2008  . ANXIETY DISORDER 07/30/2008  . DEPRESSION 07/30/2008  . HYPERTENSION 07/30/2008  . CROHN'S DISEASE 05/27/2009  . RENAL CYST 07/30/2008  . OSTEOPENIA 07/30/2008  . DEGENERATIVE DISC DISEASE 07/30/2008  . ALCOHOL ABUSE, HX OF 07/30/2008  . HEPATOMEGALY, HX OF 08/31/2008   Family History  Problem Relation Age of Onset  . Adopted: Yes   History  Substance Use Topics  . Smoking status: Never Smoker   . Smokeless tobacco: Not on file   Comment: Works at [employer]- part time  . Alcohol Use: 12.0 oz/week    20 Glasses of wine per week   Review of Systems  Constitutional: Negative for fever.  Respiratory: Negative for cough and shortness of breath.   Cardiovascular: Negative for chest pain.  Gastrointestinal: Negative for  abdominal pain.  Musculoskeletal: Negative for gait problem.  Skin: Negative for rash.  Neurological: Negative for dizziness.  No other specific complaints in a complete review of systems (except as listed in HPI above).    Objective:   Physical Exam BP 110/78  Pulse 76  Temp(Src) 98.4 F (36.9 C) (Oral)  Ht 5' (1.524 m)  Wt 117 lb 12.8 oz (53.434 kg)  BMI 23.01 kg/m2 Physical Exam  Constitutional: She is oriented to person, place, and time. She appears well-developed and well-nourished. No distress.  HENT: Head: Normocephalic and atraumatic. Nose: Nose normal. Mouth/Throat: Oropharynx is clear and moist. No oropharyngeal exudate.  Eyes: Conjunctivae and EOM are normal. Pupils are equal, round, and reactive to light. No scleral icterus.  Neck: Normal range of motion. Neck supple. No JVD present. No thyromegaly present.  Cardiovascular: Normal rate, regular rhythm and normal heart sounds.  No murmur heard. Pulmonary/Chest: Effort normal and breath sounds normal. No respiratory distress. She has no wheezes.  Abdominal: Soft. Bowel sounds are normal. She exhibits no distension. There is no tenderness.  Musculoskeletal: Normal range of motion. She exhibits no edema.  Neurological: She is alert and oriented to person, place, and time. No cranial nerve deficit. Coordination normal.  Skin: Skin is warm and dry. No rash noted. No erythema.  Psychiatric: She has a normal mood and affect. Her behavior is normal. Judgment and thought content normal.   Lab Results  Component Value Date   WBC 2.0* 01/07/2011   HGB 13.5 01/07/2011   HCT  38.3 01/07/2011   PLT 189.0 01/07/2011   CHOL 329* 01/07/2011   TRIG 419.0 Triglyceride is over 400; calculations on Lipids are invalid.* 01/07/2011   HDL 93.60 01/07/2011   LDLDIRECT 181.8 01/07/2011   ALT 22 01/07/2011   AST 37 01/07/2011   NA 140 01/07/2011   K 4.3 01/07/2011   CL 103 01/07/2011   CREATININE 0.6 01/07/2011   BUN 11 01/07/2011   CO2 30 01/07/2011    TSH 0.25* 01/07/2011      Assessment & Plan:  See problem list. Medications and labs reviewed today.

## 2011-01-14 NOTE — Patient Instructions (Signed)
It was good to see you today. Test results reviewed today - slight dose adjustment in your thyroid medications as discussed Refill on medication(s) as discussed today. Your prescription(s) have been submitted to your pharmacy. Please take as directed and contact our office if you believe you are having problem(s) with the medication(s). we'll make referral to gynecology for PAP/pelvic. Our office will contact you regarding appointment(s) once made. Please schedule followup in 6 months for thyroid check, call sooner if problems.

## 2011-02-24 NOTE — Assessment & Plan Note (Signed)
[facility] OFFICE NOTE   Tammy Kennedy, Tammy Kennedy                     MRN:          [id number]  DATE:05/23/2007                            DOB:          1956-03-18    Tammy Kennedy is a 55 year old white female with severe Crohn's disease of  the colon and of the rectum, anemia, hyperlipidemia, Remicade-induced  systemic lupus reaction evaluated by Dr. Estanislado Pandy.  The reaction now  has subsided.  The patient is free of any arthralgia.  Last colonoscopy  April 2007 showed active Crohn's disease at the rectum.  Ileocolic  anastomosis with pseudopolyp formation of the left colon.  She is doing  quite well now, having chronic diarrhea, several bowel movements daily.   MEDICATIONS:  1. Lialda 4.8 g daily.  2. Imuran 75 mg daily.  No other medications.  3. Ativan 0.5 mg p.r.n.  4. Lomotil p.r.n.  She really has not taken any.   She has not been able to stop drinking alcohol entirely, drinking about  1/2 a bottle of wine a day.   PHYSICAL EXAM:  Blood pressure 120/82, weight 123 pounds, and pulse 60.  She had skin cancer removed from the top of her nose with skin graft.  Sclerae not icteric.  NECK:  Supple.  LUNGS:  Clear to auscultation.  COR:  Normal S1, normal S2.  ABDOMEN:  Soft, nontender with normoactive bowel sounds.  Liver edge at  costal margin.  RECTAL:  Not done.  EXTREMITIES:  No edema.  SKIN:  Some palmar erythema.   IMPRESSION:  29. A 55 year old white female with Crohn's colitis, currently under      good control.  2. Status post complete resolution of a Remicade-related lupus      syndrome.   PLAN:  1. I have discussed treatment of the lupus with Dr. Estanislado Pandy, who      suggested methotrexate 20 mg weekly.  The patient has discontinued      all of her medications and remains asymptomatic.  She did not keep      her last appointment with Dr. Estanislado Pandy as she feels that she is      well as  long as she stays away from Remicade.  2. Continue Lialda 4.8 g per day.  Continue Imuran 75 mg a day.  If      she ever has a flare-up of Crohn's colitis, I will probably      consider using Humira 50 mcg every 2 weeks  instead of      Humira ,to avoid exacerbation of her lupus syndrome.  3. We will repeat her liver function tests.  I again advised her to      stop drinking alcohol.     Lowella Bandy. Olevia Perches, MD  Electronically Signed    DMB/MedQ  DD: 05/23/2007  DT: 05/24/2007  Job #: [id number]   cc:   Harden Mo, M.D.

## 2011-02-27 NOTE — Assessment & Plan Note (Signed)
[facility] OFFICE NOTE   Tammy Kennedy, Tammy Kennedy                     MRN:          [id number]  DATE:01/25/2007                            DOB:          1956/04/03    Tammy Kennedy is a 55 year old white female with Crohn's colitis and  Crohn's rectal disease diagnosed in 15s.  She also has a significant  alcohol intake, hyperlipidemia, and antibodies against Remicade which  was started about a year ago, and after induction regimen the patient  developed severe arthralgias.  She has been seen by Dr. Estanislado Pandy and  followed.  Since the Remicade has been discontinued in November 2007 her  arthralgias have subsided.  She still has sacroiliitis which currently  does not bother her.  She comes today feeling reasonably well.  There  has been no rectal bleeding, abdominal pain.  Her level of energy has  been reasonable.  The only complaint is frequent diarrhea, stools about  six to seven a day, none at night.   CURRENT MEDICATIONS:  1. Nortrel birth control pills.  2. Valtrex 500 mg daily.  3. Imuran 75 mg p.o. daily with last blood levels being subtherapeutic      in August 2007.  4. Levothroid 100 mcg daily.  5. Effexor XR 75 mg p.o. daily.  6. Boniva 150 mg p.o. monthly.  7. Calcium supplements.  8. B12.  9. Potassium supplements.  10.Iron supplements.  11.Lialda 1.2 g four tablets a day.  12.Canasa suppositories p.r.n.   PHYSICAL EXAMINATION:  VITAL SIGNS:  Blood pressure 120/90, pulse 72,  weight 121 pounds.  GENERAL:  She was in no distress.  She had a lesion on the tip of her  nose, basal cell carcinoma recently diagnosed.  She is for excision this  summer.  NECK:  Supple.  LUNGS:  Clear to auscultation.  CORONARY:  Rapid, S1, S2.  No murmur.  ABDOMEN:  Soft, nontender, with normal active bowel sounds, no  distention.  Liver edge at costal margin.  No tenderness in right upper  quadrant.  RECTAL:   Shows normal perianal area with normal rectal tone.  There was  on pain on digital exam.  There was irregularity of the rectal ampulla  secondary to pseudopolyps but no active ulcerations.  Stool was  hemoccult negative.   IMPRESSION:  A 55 year old white female with Crohn's ileocolitis and  perirectal disease, currently in reasonable control, most likely thanks  to some effect of the Remicade which since has been discontinued, as  well as due to methotrexate and Imuran as well as mesalamine.  I would  like to get her back on biologicals that will hopefully allow [place] to  discontinue the methotrexate as well as the Imuran.  Since she cannot  take Remicade, the natural choice would be Humira induction regimen  followed by a maintenance dose of 40 mg intramuscular every 2 weeks.  I  would like to talk to Dr. Estanislado Pandy about it.  The patient is in  agreement in trying this medication.  She was really feeling well on  Remicade until she developed the  arthralgia.   PLAN:  1. For now, we will increase her Imuran to 100 mg a day since she had      subtherapeutic levels last time.  2. Continue methotrexate at 20 mg a day until I talk to Dr. Estanislado Pandy.  3. Continue all other medications.  4. Add Lomotil one p.r.n. diarrhea and Ativan 0.5 mg which she uses      when she visits her aging parents in [place].  The visit usually is      quite traumatic for her emotionally and she usually ends up with      flare-up of her colitis.   I will see her again in 6-8 weeks and at that time will make decisions  about the Humira.     Lowella Bandy. Olevia Perches, MD  Electronically Signed    DMB/MedQ  DD: 01/25/2007  DT: 01/25/2007  Job #: [id number]   cc:   Harden Mo, M.D.  Bo Merino, M.D.

## 2011-02-27 NOTE — Assessment & Plan Note (Signed)
[facility] OFFICE NOTE   Tammy Kennedy, Tammy Kennedy                     MRN:          [id number]  DATE:05/07/2006                            DOB:          11-30-1955    Tammy Kennedy is a 55 year old female that has Crohn's disease predominantly  in the rectum.  Status post recent hospitalization for decompensation on  March 12, 2006.  She is doing extremely well after three doses of Remicade.  She is ready to schedule the next three every eight weeks.  As far as the  rectal pain and drainage, she is concerned it has been markedly improved.  She no longer has rectal pain.  She has some diarrhea about six times a day  which is quite manageable.  She continues on her job four hours of work a  day.  She rests usually in the afternoon after being very tired.  She has  decreased her alcohol intake to about three glasses of wine a day.  Her  weight has remained stable and she is feeling fine.  She sleeps well.  There  is no abdominal pain.   MEDICATIONS:  1.  Imuran 75 mg daily.  2.  Levothroid.  3.  Effexor.  4.  Boniva.  5.  Centrum Silver.  6.  Calcium.  7.  B12.  8.  K tabs.  9.  Vytorin.  10. Iron supplements.  11. Lialda 2.4g twice a day.  12. Canasa suppository 1000 mg nightly.   PHYSICAL EXAMINATION:  GENERAL APPEARANCE:  She appears in good spirits,  alert and oriented.  VITAL SIGNS:  Blood pressure 120/90, pulse 88, weight 118 pounds.  SKIN:  No rash.  LUNGS:  Clear to auscultation.  CARDIOVASCULAR:  Normal S1, normal S2.  ABDOMEN:  Soft with no tenderness, relaxed, normal active bowel sounds, no  mass.  RECTAL:  Rectal and anoscopic exam shows protruding skin tag through the  rectum which does not appear to be inflamed.  Rectum is somewhat spastic.  It was a painful exam when anoscope was inserted, showing friable bleeding  mucosa of the rectal ampulla with ulcerations, purulent material,  discharge  and edema.  Stool was heme-positive.   IMPRESSION:  1.  A 55 year old female with rectal Crohn's disease, symptomatically      improved from Remicade.  2.  Alcohol abuse, currently under control.  3.  Hyperlipidemia.  4.  Anemia.   PLAN:  1.  Schedule three more infusions of Remicade every eight weeks.  2.  Today CBC and sed rate.   I will see her again in three months.  She is to continue on all her  medications.                                   Tammy Kennedy. Olevia Perches, MD   DMB/MedQ  DD:  05/07/2006  DT:  05/07/2006  Job #:  [id number]   cc:   Tammy Mo, MD

## 2011-02-27 NOTE — Letter (Signed)
August 10, 2006    Leary Roca, MD  [place], [place] [address]   RE:  BLAKELYN, DINGES  MRN:  [id number]  /  DOB:  11-25-1955   Dear Candie Echevaria:   Ms. Heinrichs has an appointment to see you for evaluation of polyarthralgia  associated with inflammatory bowel disease/possible Remicade side effects,  possible lupus syndrome.  She is a 55 year old white female whom I have  followed for Crohn disease since 50.  She has had severe flare-ups at  times but never had severe polyarthralgia.  She had only sacroiliitis in the  past.  We have recently put her on Remicade because of refractory colitis to  steroids, immunomodulators, and mesalamine products.  She has predominantly  rectal disease as well as colitis and ileocecal involvement.  The Remicade  really has cleared up her symptoms and she is doing well GI wise, but she is  gradually developing a lot of joint pains in small as well as large joints  to the point where she almost cannot walk around and she is running low  grade temperature.  I would like you to help me mange her polyarthralgia.   From a GI standpoint, I have several options.  One is to stop giving her  Remicade altogether and that would put her at high risk of a flare up of her  Crohn's.  At the same time, I could switch her to Humira 40 mg every 2 weeks  which is a real option but I do not want to do it unless we can demonstrate  Remicade antibodies because overall her Crohn disease is doing better.  Other possibility would be to use something like methotrexate if you suggest  it in place of the Remicade.  She is already on Imuran 75 mg daily.  I have  asked her to cancel her next Remicade infusion.  It was scheduled for  August 25, 2006.  She already has received 3 infusions during the summer  and one 8 weeks ago.  This would have been her fifth infusion.  Should we  decide to go with Humira, it would take me probably several weeks to have it  approved  and her insurance to pay for it.   I will be faxing to your office all necessary information.  I certainly  appreciate your assistance with this nice lady.   Addendum:  I have put her on prednisone 10 mg today just to relieve her  joint pains.    Sincerely,      Lowella Bandy. Olevia Perches, MD    DMB/MedQ  DD: 08/10/2006  DT: 08/10/2006  Job #: [id number]

## 2011-02-27 NOTE — Discharge Summary (Signed)
[name], Kennedy              ACCOUNT NO.:  [id number]   MEDICAL RECORD NO.:  [id number]          PATIENT TYPE:  INP   LOCATION:  5733                         FACILITY:  [facility]   PHYSICIAN:  Docia Chuck. Henrene Pastor, MD      DATE OF BIRTH:  1956-06-26   DATE OF ADMISSION:  03/12/2006  DATE OF DISCHARGE:  03/15/2006                                 DISCHARGE SUMMARY   ADMISSION DIAGNOSES:  43. A 55 year old female with refractory rectal Crohn's disease.  2. Weight loss secondary to above.  3. History of ethanol abuse.  4. Hyperlipidemia.  5. Hypertension.  6. Hypothyroidism.  7. Anxiety and depression.   DISCHARGE DIAGNOSES:  1. Rectal Crohn's disease.  2. Severe rectal pain secondary to thrombosed hemorrhoid.  3. Question of jejunal intussusception on CT scan, which was not confirmed      on small bowel follow through.  4. Macrocytic anemia.  5. Other diagnoses as listed above.   CONSULTATIONS:  None.   PROCEDURE:  CT scan of the abdomen and pelvis and small bowel follow  through.   BRIEF HISTORY:  Tammy Kennedy is a 55 year old white female known to Dr. Delfin Edis, primary patient of Dr. Viann Shove, with history as described above.  She has a long history of Crohn's disease, primarily Crohn's colitis, which  was initially diagnosed in 80.  She had not required any prior resections.  Last colonoscopy was done on February 08, 2006, which was normal.  She was  noted to have some mild erythema in the terminal ileum, but no significant  inflammation and was noted to have large rectal ulcerations.  She has been  on a vigorous outpatient regimen recently.  But, despite that, has had  constant rectal pain without any improvement and, at this point, has  inability to sit comfortably due to pain.  She stopped eating because if she  does not eat she has less bowel movements and, therefore, less pain.  She  has lost about 11 pounds.  She is currently having 3 to 4 bowel movements  per day,  intermittently seeing blood, has not had any fever or chills.  No  nausea or vomiting.  She has been on Imuran at 75 mg over the past couple of  years.  She has recently been on Flagyl orally, Cipro orally, Lialda b.i.d.,  and Antivert 9 mg at home.  She has been trying cort enemas, but is to the  point where she cannot retain the enema.  So, she has also been requiring  Percocet and Xanax for pain control.  She was seen and evaluated by Dr.  Olevia Perches in the office on the day of admission.  A decision was made to admit  her for more intensive medical management, including complete bowel rest  with T and A, IV steroids, and an initiation of Remicade for what was felt  to be exacerbation of rectal Crohn's.   LABORATORY DATA:  On March 12, 2006:  WBC 6.9, hemoglobin 10.7, hematocrit  32, MCV 105, platelets 618,000, sed rate of 103.  Electrolytes within normal  limits.  Glucose 108, BUN 11, creatinine 0.8.  Albumin 2.7.  Liver function  studies within normal limits.  Magnesium 2.3, phosphorus 2.9.  Urinalysis  showed 11 to 20 WBCs, few bacteria.  Prealbumin was 24.8.   X-RAY STUDIES:  CT scan of the abdomen and pelvis, done on March 12, 2006,  shows duodenal intussusception in the left mid lower quadrant involving the  proximal duodenum and is either not causing obstruction or is intermittent.  There was associated diffuse wall thickening involving the involved loops.  Small bowel and colon loops in the pelvis had normal appearances.  The  poorly distended rectum made it difficult to evaluate for a possibility of  proctitis.  No abscesses were seen.  Small bowel follow through, on March 15, 2006, rapid transit with brisk peristalsis, multiple small bowel diverticula  of the distal ileum, no definite changes of Crohn's in the small bowel or  large bowel visualized, and no evidence of duodenal intussusception.   HOSPITAL COURSE:  The patient was admitted to the service of Dr. Delfin Edis.  She  was placed on IV fluids, clear liquid diet, started on IV Solu-  Medrol, Asacol, Flagyl, Canasa suppositories, and was scheduled for CT scan  of the abdomen and pelvis.  She was also setup for PICC line placement, T  and A, and a Remicade infusion.  Rectal exam had not been done in the office  as the patient was having significant pain.  This was done on March 13, 2006  per Dr. Henrene Pastor, who was covering the hospital.  She had a large, tender,  thrombosed hemorrhoid and this was manually decompressed.  Because of the CT  findings of intussusception, small bowel follow through was ordered.  The  patient was not having any pain, which was felt somewhat inconsistent with a  picture of an intussusception.  She was placed on increased rectal care,  lidocaine cream, and continued Canasa suppositories.  It was felt, if  Remicade was to be initiated, that this should be done at a later date  because of her low-grade fever, etc., with the hemorrhoid, small bowel  follow through with findings as outline above.  By March 15, 2006, she was  feeling better, felt that she could manage at home, and was allowed  discharge to home with instructions to follow up with Dr. Olevia Perches on March 18, 2006 at 9:00 a.m.   DISCHARGE MEDICATIONS:  1. Prednisone 20 mg p.o. daily.  2. AnaMantel gel t.i.d.  3. Asacol 400 3 p.o. q.i.d.  4. Valtrex 500 b.i.d.  5. Flagyl 250 t.i.d. x10 days.  6. Imuran at 75 mg daily.  7. Rowasa suppositories 1000 q.h.s.  8. Effexor 75 daily.  9. Synthroid and Vytorin as previous, and Percocet 1 every 4 to 6 hours as      needed for pain.  She was to do sitz baths t.i.d.   CONDITION:  Stable.      Amy Esterwood, PA-C      ______________________________  Docia Chuck. Henrene Pastor, MD    AE/MEDQ  D:  08/17/2006  T:  08/18/2006  Job:  [id number]

## 2011-02-27 NOTE — H&P (Signed)
NAME:  Tammy, Kennedy NO.:  [id number]   MEDICAL RECORD NO.:  [id number]          PATIENT TYPE:  INP   LOCATION:  5733                         FACILITY:  [facility]   PHYSICIAN:  Delfin Edis, M.D. [facility]  DATE OF BIRTH:  [birth year]   DATE OF ADMISSION:  03/12/2006  DATE OF DISCHARGE:                                HISTORY & PHYSICAL   CHIEF COMPLAINT:  Refractory rectal pain and rectal Crohn's disease.   HISTORY:  Tammy Kennedy is a pleasant 55 year old white female well known to Dr.  Delfin Edis, primary patient of Dr. Jake Michaelis, who has a long history of  Crohn's disease, primarily Crohn's colitis which was initially diagnosed in  82.  She has not required any previous bowel resection.  She last had CT  scan in September, 2006 which did show a somewhat narrowed sigmoid and  hepatic flexure.  The terminal ileum appeared normal.  She did have some  inflammatory change consistent with Crohn's in the ascending colon.  She had  colonoscopy on February 08, 2006, which was normal.  She did have some mild  erythema in the terminal ileum but no significant inflammation, and was  noted to have large rectal ulcerations.  She has been on a vigorous  outpatient regimen recently, but despite that, has constant rectal pain  without any improvement and inability to sit comfortably due to pain.  She  has stopped eating, because if she does not eat, she has less bowel  movements and therefore less pain and has lost about 11 pounds over the past  couple of months.  She is currently having 3-4 bowel movements per day,  intermittently seeing blood.  She has not noted any fevers or chills.  No  nausea or vomiting.  She has been on Imuran 75 mg over the past couple of  years.  Has been on Flagyl orally, Cipro orally, Lialda  2.5 gm b.i.d., and  Entocort 9 mg daily at home.  She had been tried on cort enemas but recently  has been to the point where she cannot retain the enema.  She has been using  Percocet and Xanax for pain control.  She was seen and evaluated by Dr.  Olevia Perches today.  The decision made to admit her to the hospital for more  intensive medical management, including complete bowel rest with TNA, IV  Solu-Medrol, and initiation of Remicade.   CURRENT MEDICATIONS:  1.  Effexor XR 75 mg daily.  2.  Valtrex 500 mg daily.  3.  Darvocet-N 100 q.6h. p.r.n.  4.  Hydrocodone p.r.n.  5.  Percocet p.r.n.  6.  Alprazolam 0.5 q.h.s. p.r.n.  7.  Multivitamin daily.  8.  Folic acid 458 mcg daily.  9.  Potassium 99 mEq daily.  10. B12 supplement daily.  11. Iron 65 mg daily.  12. Os-Cal Plus D 500 2 tablets daily.  13. Nortrel 0.5/0.035 birth control pill daily.  14. She is on mesalamine 1200 mg, Lialda, which is 2 tablets b.i.d.  15. Entocort 9 mg daily.  16. Cipro 250 b.i.d.  17. Imuran 75 mg daily.  18. Flagyl 500 mg daily.  19. Levothroid 0.1 daily.  20. Vytorin 10/20 q.h.s.   ALLERGIES:  No known drug allergies.   PAST MEDICAL HISTORY:  1.  As outlined above.  She does have a history of anxiety/depression and      alcohol abuse.  She went through rehab last summer and has been weaning      down on her intake.  Currently says that she is drinking about two      glasses of wine every other day.  2.  Hyperlipidemia.  3.  Hypothyroidism.  4.  Hypertension.  5.  She had a hemorrhoidectomy and fissure repair in 1978.   SOCIAL HISTORY:  Patient is single.  Lives alone.  Has a dog.  She is a  nonsmoker.  Alcohol:  As above.  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   FAMILY HISTORY:  Patient is adopted.   PHYSICAL EXAMINATION:  VITAL SIGNS:  Blood pressure 108/70, pulse 88, temp  98.8, respirations 16.  GENERAL:  A well-developed white female in no acute distress.  She is  flushed-appearing, chronically ill-appearing, thin.  HEENT:  Cheeks are flushed.  Conjunctivae are somewhat injected.  LUNGS:  Clear to A&P.  CARDIOVASCULAR:   Tachy, regular rhythm with S1 and S2.  ABDOMEN:  Soft and nontender.  Bowel sounds are active.  She is  nondistended.  There is no palpable mass or hepatosplenomegaly.  RECTAL:  Per Dr. Olevia Perches.  No digital exam was done secondary to pain.  She  is very tender perineally with no perirectal disease noted.  No fluctuans.  EXTREMITIES:  Without clubbing, cyanosis or edema.   IMPRESSION:  1.  A 55 year old female with refractory rectal Crohn's disease.  2.  Weight loss secondary to above.  3.  History of ethanol abuse.  4.  Hyperlipidemia.  5.  Hypertension.  6.  Hypothyroidism.  7.  Anxiety and depression.   PLAN:  Admit for bowel rest, IV Solu-Medrol.  Dr. Olevia Perches has discussed  initiating Remicade with the patient, and she is in agreement.  This will be  initiated this admission.  Plan to place a PICC line and start her on TNA.  She will  be continued on her Lialda, Imuran, and Flagyl.  CT scan of the abdomen and  pelvis.  Then expect she will need a flex sigmoidoscopy at some point during  this admission to assess response to above regimen.   ADDENDUM:  Patient had a PPD done as an outpatient within the past month,  and this was negative.      Nicoletta Ba, P.A.-C. [facility]      Delfin Edis, M.D. [facility]  Electronically Signed    AE/MEDQ  D:  03/12/2006  T:  03/12/2006  Job:  [id number]   cc:   Harden Mo, M.D.  Fax: [phone]

## 2011-04-20 ENCOUNTER — Telehealth: Payer: Self-pay | Admitting: *Deleted

## 2011-04-20 DIAGNOSIS — K5090 Crohn's disease, unspecified, without complications: Secondary | ICD-10-CM

## 2011-04-20 DIAGNOSIS — D649 Anemia, unspecified: Secondary | ICD-10-CM

## 2011-04-20 NOTE — Telephone Encounter (Signed)
Patient needs labs completed. Labs have been entered into Epic. Patient also needs colonoscopy as per colonoscopy letter sent in May 2012. I have left a message for patient to call back.

## 2011-04-20 NOTE — Telephone Encounter (Signed)
Message copied by Larina Bras on Mon Apr 20, 2011  9:55 AM ------      Message from: Larina Bras      Created: Tue Mar 31, 2011  8:35 AM                   ----- Message -----         From: Dixon Boos, CMA         Sent: 03/31/2011           To: Dixon Boos            Patient to have labs completed around 04/2011.... Enter into idx and call patient to remind her. See emr note dated 10/21/10

## 2011-04-21 NOTE — Telephone Encounter (Signed)
Advised patient to come for labs this week. Patient also advised she is overdue for her colonoscopy. Patient states that she is unable to have colonoscopy at this time because she is unable to find anyone to bring her right now.

## 2011-04-30 ENCOUNTER — Other Ambulatory Visit (INDEPENDENT_AMBULATORY_CARE_PROVIDER_SITE_OTHER): Payer: BC Managed Care – PPO

## 2011-04-30 DIAGNOSIS — D649 Anemia, unspecified: Secondary | ICD-10-CM

## 2011-04-30 DIAGNOSIS — K5090 Crohn's disease, unspecified, without complications: Secondary | ICD-10-CM

## 2011-04-30 LAB — COMPREHENSIVE METABOLIC PANEL
AST: 35 U/L (ref 0–37)
Albumin: 4.8 g/dL (ref 3.5–5.2)
Alkaline Phosphatase: 66 U/L (ref 39–117)
Glucose, Bld: 112 mg/dL — ABNORMAL HIGH (ref 70–99)
Potassium: 4.5 mEq/L (ref 3.5–5.1)
Sodium: 138 mEq/L (ref 135–145)
Total Bilirubin: 0.7 mg/dL (ref 0.3–1.2)
Total Protein: 7.9 g/dL (ref 6.0–8.3)

## 2011-04-30 LAB — CBC WITH DIFFERENTIAL/PLATELET
Basophils Relative: 1 % (ref 0.0–3.0)
Eosinophils Absolute: 0 10*3/uL (ref 0.0–0.7)
Eosinophils Relative: 0.7 % (ref 0.0–5.0)
HCT: 38.5 % (ref 36.0–46.0)
Lymphs Abs: 0.7 10*3/uL (ref 0.7–4.0)
MCHC: 34.7 g/dL (ref 30.0–36.0)
MCV: 107.1 fl — ABNORMAL HIGH (ref 78.0–100.0)
Monocytes Absolute: 0.4 10*3/uL (ref 0.1–1.0)
Neutrophils Relative %: 65.3 % (ref 43.0–77.0)
Platelets: 212 10*3/uL (ref 150.0–400.0)
WBC: 3.2 10*3/uL — ABNORMAL LOW (ref 4.5–10.5)

## 2011-04-30 LAB — SEDIMENTATION RATE: Sed Rate: 10 mm/hr (ref 0–22)

## 2011-05-04 ENCOUNTER — Telehealth: Payer: Self-pay | Admitting: *Deleted

## 2011-05-04 NOTE — Telephone Encounter (Signed)
Patient notified of results as per Dr. Brodie. 

## 2011-05-04 NOTE — Telephone Encounter (Signed)
Message copied by Hulan Saas on Mon May 04, 2011  8:26 AM ------      Message from: Lafayette Dragon      Created: Sat May 02, 2011  9:50 PM       Please call pt with normal bood tests

## 2011-05-18 ENCOUNTER — Encounter: Payer: Self-pay | Admitting: Internal Medicine

## 2011-10-13 DIAGNOSIS — D369 Benign neoplasm, unspecified site: Secondary | ICD-10-CM

## 2011-10-13 HISTORY — DX: Benign neoplasm, unspecified site: D36.9

## 2011-11-11 ENCOUNTER — Other Ambulatory Visit: Payer: Self-pay | Admitting: Internal Medicine

## 2011-12-30 ENCOUNTER — Other Ambulatory Visit: Payer: Self-pay | Admitting: Internal Medicine

## 2012-01-14 ENCOUNTER — Other Ambulatory Visit: Payer: Self-pay | Admitting: Internal Medicine

## 2012-01-15 ENCOUNTER — Telehealth: Payer: Self-pay

## 2012-01-15 NOTE — Telephone Encounter (Signed)
Pharmacy sent fax stating that they are unable to get Levoxyl in stock. Pharmacy is requesting either a temporary or permanent change to Levothyroxine, please advise.

## 2012-01-15 NOTE — Telephone Encounter (Signed)
Pharmacy and patient advised of same.

## 2012-01-15 NOTE — Telephone Encounter (Signed)
Ok from my medical perspective to change. Please let patient know same. Pharmacy to resume Levoxyl at future date if patient prefers

## 2012-01-25 ENCOUNTER — Telehealth: Payer: Self-pay | Admitting: *Deleted

## 2012-01-25 DIAGNOSIS — Z0000 Encounter for general adult medical examination without abnormal findings: Secondary | ICD-10-CM

## 2012-01-25 MED ORDER — VENLAFAXINE HCL ER 150 MG PO CP24: 150.0000 mg | ORAL_CAPSULE | ORAL | Status: DC

## 2012-01-25 NOTE — Telephone Encounter (Signed)
Refilled effexor

## 2012-02-11 ENCOUNTER — Other Ambulatory Visit: Payer: Self-pay | Admitting: Internal Medicine

## 2012-03-02 ENCOUNTER — Encounter: Payer: Self-pay | Admitting: Internal Medicine

## 2012-03-02 ENCOUNTER — Other Ambulatory Visit (INDEPENDENT_AMBULATORY_CARE_PROVIDER_SITE_OTHER): Payer: BC Managed Care – PPO

## 2012-03-02 ENCOUNTER — Ambulatory Visit (INDEPENDENT_AMBULATORY_CARE_PROVIDER_SITE_OTHER): Payer: BC Managed Care – PPO | Admitting: Internal Medicine

## 2012-03-02 VITALS — BP 112/64 | HR 80 | Ht 59.0 in | Wt 115.0 lb

## 2012-03-02 DIAGNOSIS — K5010 Crohn's disease of large intestine without complications: Secondary | ICD-10-CM

## 2012-03-02 DIAGNOSIS — N824 Other female intestinal-genital tract fistulae: Secondary | ICD-10-CM

## 2012-03-02 DIAGNOSIS — N823 Fistula of vagina to large intestine: Secondary | ICD-10-CM

## 2012-03-02 LAB — COMPREHENSIVE METABOLIC PANEL
ALT: 19 U/L (ref 0–35)
BUN: 11 mg/dL (ref 6–23)
CO2: 30 mEq/L (ref 19–32)
Calcium: 10 mg/dL (ref 8.4–10.5)
Chloride: 104 mEq/L (ref 96–112)
Creatinine, Ser: 0.6 mg/dL (ref 0.4–1.2)
GFR: 107.93 mL/min (ref >60.00)
Glucose, Bld: 88 mg/dL (ref 70–99)

## 2012-03-02 LAB — CBC WITH DIFFERENTIAL/PLATELET
Basophils Absolute: 0 10*3/uL (ref 0.0–0.1)
Hemoglobin: 14 g/dL (ref 12.0–15.0)
Lymphocytes Relative: 25 % (ref 12.0–46.0)
Monocytes Relative: 15.9 % — ABNORMAL HIGH (ref 3.0–12.0)
Neutro Abs: 1.4 10*3/uL (ref 1.4–7.7)
RDW: 12.9 % (ref 11.5–14.6)
WBC: 2.6 10*3/uL — ABNORMAL LOW (ref 4.5–10.5)

## 2012-03-02 LAB — VITAMIN B12: Vitamin B-12: 734 pg/mL (ref 211–911)

## 2012-03-02 MED ORDER — PEG-KCL-NACL-NASULF-NA ASC-C 100 G PO SOLR: 1.0000 | Freq: Once | ORAL | Status: DC

## 2012-03-02 NOTE — Progress Notes (Signed)
Tammy Kennedy [birth year] MRN [id number]   History of Present Illness:  This is a 56 year old white female with Crohn's colitis and ileitis with rectal involvement since the 1980s. Her last flareup was 3 years ago. She has been on maintenance dose of Imuran 50 mg a day and on B12 supplements. She developed a rectovaginal fistula about a year ago. She has a history of significant alcohol intake. Her last colonoscopy in April 2007 showed pseudopolyps and active colitis. Biopsies from the terminal ileum were normal. Her last prednisone taper was in September 2010. An upper abdominal ultrasound in December 2009 showed a liver cyst but no evidence of parenchymal liver disease. Her spleen was 5.5 cm. A small bowel follow-through in June 2007 showed rapid transit time with distal small bowel diverticula. She is doing well. She has no complaints except for occasional drainage from the vagina when she has diarrhea. She also has occasional aphthous stomatitis. She denies low back pain. Her weight has been stable.  Past Medical History  Diagnosis Date  . HYPOTHYROIDISM 07/30/2008  . HYPERLIPIDEMIA 07/30/2008  . ANEMIA 07/30/2008  . ANXIETY DISORDER 07/30/2008  . DEPRESSION 07/30/2008  . HYPERTENSION 07/30/2008  . CROHN'S DISEASE 05/27/2009  . RENAL CYST 07/30/2008  . OSTEOPENIA 07/30/2008  . DEGENERATIVE DISC DISEASE 07/30/2008  . ALCOHOL ABUSE, HX OF 07/30/2008  . HEPATOMEGALY, HX OF 08/31/2008  . ENTERITIS 07/30/2008  . DRY EYE SYNDROME 08/28/2009   Past Surgical History  Procedure Date  . Anal fissure repair   . Hemorrhoid surgery   . Theraputic abortion   . Mohs surgery 2008    Nose BCC  . Left thumb surgery 1989    reports that she has never smoked. She has never used smokeless tobacco. She reports that she drinks about 12 ounces of alcohol per week. She reports that she does not use illicit drugs. family history is not on file.  She is adopted. No Known Allergies      Review of  Systems: Negative for heartburn shortness of breath or chest pain  The remainder of the 10 point ROS is negative except as outlined in H&P   Physical Exam: General appearance  Well developed, in no distress. Eyes- non icteric. HEENT nontraumatic, normocephalic. Mouth , tongue papillated, no cheilosis. Small aphthous ulcer on the right side of the buccal mucosa Neck supple without adenopathy, thyroid not enlarged, no carotid bruits, no JVD. Lungs Clear to auscultation bilaterally. Cor normal S1, normal S2, regular rhythm, no murmur,  quiet precordium. Abdomen: Soft relaxed with normal active bowel sounds. No tenderness. No distention, liver edge at costal margin. Rectal: Deferred until colonoscopy. Extremities no pedal edema. Skin no lesions. Neurological alert and oriented x 3. Psychological normal mood and affect.  Assessment and Plan:  Problem #1 Stable Crohn's colitis with rectal involvement maintained on a low-dose of Imuran 50 mg daily. She is due for a recall colonoscopy as well as for yearly blood tests. We will recheck her B12 and iron levels as well as liver function tests and blood count. She will continue on the Imuran.   03/02/2012 Delfin Edis

## 2012-03-02 NOTE — Patient Instructions (Signed)
You have been scheduled for a colonoscopy with propofol. Please follow written instructions given to you at your visit today.  Please pick up your prep kit at the pharmacy within the next 1-3 days. Your physician has requested that you go to the basement for the following lab work before leaving today: CMET, CBC, IBC, B12, TSH If you are interested, Dr Marylynn Pearson is an OB/GYN. You may reach her office at [phone]. CC: Tammy Kennedy

## 2012-03-03 LAB — IBC PANEL
Iron: 89 ug/dL (ref 42–145)
Saturation Ratios: 22.6 % (ref 20.0–50.0)

## 2012-03-04 ENCOUNTER — Other Ambulatory Visit: Payer: Self-pay | Admitting: *Deleted

## 2012-03-04 MED ORDER — FOLIC ACID 1 MG PO TABS: 1.0000 mg | ORAL_TABLET | Freq: Every day | ORAL | Status: DC

## 2012-03-10 ENCOUNTER — Other Ambulatory Visit: Payer: Self-pay | Admitting: Internal Medicine

## 2012-03-16 ENCOUNTER — Other Ambulatory Visit: Payer: Self-pay | Admitting: Gastroenterology

## 2012-04-11 ENCOUNTER — Other Ambulatory Visit: Payer: Self-pay | Admitting: Internal Medicine

## 2012-04-28 ENCOUNTER — Other Ambulatory Visit: Payer: Self-pay | Admitting: Internal Medicine

## 2012-05-06 ENCOUNTER — Encounter: Payer: Self-pay | Admitting: Internal Medicine

## 2012-05-06 ENCOUNTER — Ambulatory Visit (AMBULATORY_SURGERY_CENTER): Payer: BC Managed Care – PPO | Admitting: Internal Medicine

## 2012-05-06 VITALS — BP 129/74 | HR 78 | Temp 98.1°F | Resp 15 | Ht 59.0 in | Wt 115.0 lb

## 2012-05-06 DIAGNOSIS — K6389 Other specified diseases of intestine: Secondary | ICD-10-CM

## 2012-05-06 DIAGNOSIS — K5010 Crohn's disease of large intestine without complications: Secondary | ICD-10-CM

## 2012-05-06 DIAGNOSIS — K5090 Crohn's disease, unspecified, without complications: Secondary | ICD-10-CM

## 2012-05-06 DIAGNOSIS — N823 Fistula of vagina to large intestine: Secondary | ICD-10-CM

## 2012-05-06 DIAGNOSIS — D126 Benign neoplasm of colon, unspecified: Secondary | ICD-10-CM

## 2012-05-06 DIAGNOSIS — N824 Other female intestinal-genital tract fistulae: Secondary | ICD-10-CM

## 2012-05-06 MED ORDER — SODIUM CHLORIDE 0.9 % IV SOLN: 500.0000 mL | INTRAVENOUS | Status: DC

## 2012-05-06 NOTE — Progress Notes (Signed)
Patient did not experience any of the following events: a burn prior to discharge; a fall within the facility; wrong site/side/patient/procedure/implant event; or a hospital transfer or hospital admission upon discharge from the facility. (G8907) Patient did not have preoperative order for IV antibiotic SSI prophylaxis. (G8918)  

## 2012-05-06 NOTE — Patient Instructions (Addendum)
YOU HAD AN ENDOSCOPIC PROCEDURE TODAY AT THE [facility]: Refer to the procedure report that was given to you for any specific questions about what was found during the examination.  If the procedure report does not answer your questions, please call your gastroenterologist to clarify.  If you requested that your care partner not be given the details of your procedure findings, then the procedure report has been included in a sealed envelope for you to review at your convenience later.  YOU SHOULD EXPECT: Some feelings of bloating in the abdomen. Passage of more gas than usual.  Walking can help get rid of the air that was put into your GI tract during the procedure and reduce the bloating. If you had a lower endoscopy (such as a colonoscopy or flexible sigmoidoscopy) you may notice spotting of blood in your stool or on the toilet paper. If you underwent a bowel prep for your procedure, then you may not have a normal bowel movement for a few days.  DIET: Your first meal following the procedure should be a light meal and then it is ok to progress to your normal diet.  A half-sandwich or bowl of soup is an example of a good first meal.  Heavy or fried foods are harder to digest and may make you feel nauseous or bloated.  Likewise meals heavy in dairy and vegetables can cause extra gas to form and this can also increase the bloating.  Drink plenty of fluids but you should avoid alcoholic beverages for 24 hours.  ACTIVITY: Your care partner should take you home directly after the procedure.  You should plan to take it easy, moving slowly for the rest of the day.  You can resume normal activity the day after the procedure however you should NOT DRIVE or use heavy machinery for 24 hours (because of the sedation medicines used during the test).    SYMPTOMS TO REPORT IMMEDIATELY: A gastroenterologist can be reached at any hour.  During normal business hours, 8:30 AM to 5:00 PM Monday through Friday,  call [phone].  After hours and on weekends, please call the GI answering service at [phone] who will take a message and have the physician on call contact you.   Following lower endoscopy (colonoscopy or flexible sigmoidoscopy):  Excessive amounts of blood in the stool  Significant tenderness or worsening of abdominal pains  Swelling of the abdomen that is new, acute  Fever of 100F or higher  Following upper endoscopy (EGD)  Vomiting of blood or coffee ground material  New chest pain or pain under the shoulder blades  Painful or persistently difficult swallowing  New shortness of breath  Fever of 100F or higher  Black, tarry-looking stools  FOLLOW UP: If any biopsies were taken you will be contacted by phone or by letter within the next 1-3 weeks.  Call your gastroenterologist if you have not heard about the biopsies in 3 weeks.  Our staff will call the home number listed on your records the next business day following your procedure to check on you and address any questions or concerns that you may have at that time regarding the information given to you following your procedure. This is a courtesy call and so if there is no answer at the home number and we have not heard from you through the emergency physician on call, we will assume that you have returned to your regular daily activities without incident.  SIGNATURES/CONFIDENTIALITY: You and/or your care   partner have signed paperwork which will be entered into your electronic medical record.  These signatures attest to the fact that that the information above on your After Visit Summary has been reviewed and is understood.  Full responsibility of the confidentiality of this discharge information lies with you and/or your care-partner.  

## 2012-05-06 NOTE — Op Note (Signed)
[facility] [employer] [address] [place], [place]  [id number]  COLONOSCOPY PROCEDURE REPORT  PATIENT:  Tammy, Kennedy  MR#:  [id number] BIRTHDATE:  1956/08/02, 79 yrs. old  GENDER:  female ENDOSCOPIST:  Lowella Bandy. Olevia Perches, MD REF. BY: PROCEDURE DATE:  05/06/2012 PROCEDURE:  Colonoscopy with biopsy ASA CLASS:  Class II INDICATIONS:  Crohn's disease Crohn's of the rectum and TI, prior colon 2003,,2007 on Imuran has rectovaginal fistula chronic diarrhea MEDICATIONS:   MAC sedation, administered by CRNA, propofol (Diprivan) 330 mg  DESCRIPTION OF PROCEDURE:   After the risks and benefits and of the procedure were explained, informed consent was obtained. Digital rectal exam was performed and revealed no rectal masses. The LB [id number] endoscope was introduced through the anus and advanced to the cecum, which was identified by both the appendix and ileocecal valve.  The quality of the prep was good, using MoviPrep.  The instrument was then slowly withdrawn as the colon was fully examined. <<PROCEDUREIMAGES>>  FINDINGS:  pseudopolyps in the sigmoid colon. at 20-40 cm multiple pseudopolyps With standard forceps, biopsy was obtained and sent to pathology (see image9, image8, image7, and image6).  A fistula was found in the rectum (see image13, image14, image12, image11, and image10).  This was otherwise a normal examination of the colon. Random biopsies were obtained and sent to pathology (see image5, image4, image3, image2, and image1). ilrocecal valve appears normal, no deformity, TI not entered, colon appears normal- random biopsies taken   Retroflexed views in the rectum revealed no abnormalities.    The scope was then withdrawn from the patient and the procedure completed.  COMPLICATIONS:  None ENDOSCOPIC IMPRESSION: 1) Pseudopolyps in the sigmoid colon 2) Fistula in the rectum 3) Otherwise normal examination rectovaginal fistula, healed ulcers in the rectum, no  active disease in the colon/rectum s/p mulltiple biopsies RECOMMENDATIONS: 1) Await pathology results continue the same medication, pt is in remission,  REPEAT EXAM:  In 5 year(s) for.  ______________________________ Lowella Bandy. Olevia Perches, MD  CC:  n. eSIGNED:   Lowella Bandy. Loneta Tamplin at 05/06/2012 09:59 AM  Dickie La, [phone]

## 2012-05-09 ENCOUNTER — Telehealth: Payer: Self-pay | Admitting: *Deleted

## 2012-05-09 NOTE — Telephone Encounter (Signed)
  Follow up Call-  Call back number 05/06/2012  Post procedure Call Back phone  # [phone]  Permission to leave phone message Yes     Left message.

## 2012-05-10 ENCOUNTER — Encounter: Payer: Self-pay | Admitting: Internal Medicine

## 2012-05-10 ENCOUNTER — Other Ambulatory Visit: Payer: Self-pay | Admitting: Internal Medicine

## 2012-06-06 ENCOUNTER — Other Ambulatory Visit: Payer: Self-pay | Admitting: Internal Medicine

## 2012-06-08 ENCOUNTER — Other Ambulatory Visit: Payer: Self-pay | Admitting: Internal Medicine

## 2012-06-27 ENCOUNTER — Encounter: Payer: BC Managed Care – PPO | Admitting: Internal Medicine

## 2012-06-28 ENCOUNTER — Other Ambulatory Visit: Payer: Self-pay | Admitting: Internal Medicine

## 2012-06-30 ENCOUNTER — Other Ambulatory Visit: Payer: Self-pay | Admitting: General Practice

## 2012-06-30 MED ORDER — LEVOTHYROXINE SODIUM 50 MCG PO TABS: 50.0000 ug | ORAL_TABLET | ORAL | Status: DC

## 2012-06-30 MED ORDER — VENLAFAXINE HCL ER 150 MG PO CP24: 150.0000 mg | ORAL_CAPSULE | Freq: Every morning | ORAL | Status: DC

## 2012-07-09 ENCOUNTER — Other Ambulatory Visit: Payer: Self-pay | Admitting: Internal Medicine

## 2012-07-18 ENCOUNTER — Ambulatory Visit (INDEPENDENT_AMBULATORY_CARE_PROVIDER_SITE_OTHER): Payer: BC Managed Care – PPO | Admitting: Internal Medicine

## 2012-07-18 ENCOUNTER — Encounter: Payer: Self-pay | Admitting: Internal Medicine

## 2012-07-18 ENCOUNTER — Other Ambulatory Visit (INDEPENDENT_AMBULATORY_CARE_PROVIDER_SITE_OTHER): Payer: BC Managed Care – PPO

## 2012-07-18 VITALS — BP 128/90 | HR 84 | Temp 98.0°F | Ht 60.0 in | Wt 112.0 lb

## 2012-07-18 DIAGNOSIS — E039 Hypothyroidism, unspecified: Secondary | ICD-10-CM

## 2012-07-18 DIAGNOSIS — R7989 Other specified abnormal findings of blood chemistry: Secondary | ICD-10-CM

## 2012-07-18 DIAGNOSIS — R945 Abnormal results of liver function studies: Secondary | ICD-10-CM

## 2012-07-18 DIAGNOSIS — Z1239 Encounter for other screening for malignant neoplasm of breast: Secondary | ICD-10-CM

## 2012-07-18 DIAGNOSIS — Z0000 Encounter for general adult medical examination without abnormal findings: Secondary | ICD-10-CM

## 2012-07-18 DIAGNOSIS — E785 Hyperlipidemia, unspecified: Secondary | ICD-10-CM

## 2012-07-18 DIAGNOSIS — K5090 Crohn's disease, unspecified, without complications: Secondary | ICD-10-CM

## 2012-07-18 DIAGNOSIS — Z780 Asymptomatic menopausal state: Secondary | ICD-10-CM

## 2012-07-18 LAB — BASIC METABOLIC PANEL
CO2: 28 mEq/L (ref 19–32)
Calcium: 9.5 mg/dL (ref 8.4–10.5)
Creatinine, Ser: 0.7 mg/dL (ref 0.4–1.2)

## 2012-07-18 LAB — HEPATIC FUNCTION PANEL
AST: 198 U/L — ABNORMAL HIGH (ref 0–37)
Albumin: 4.2 g/dL (ref 3.5–5.2)
Alkaline Phosphatase: 85 U/L (ref 39–117)
Total Protein: 7.4 g/dL (ref 6.0–8.3)

## 2012-07-18 LAB — CBC WITH DIFFERENTIAL/PLATELET
Basophils Absolute: 0 10*3/uL (ref 0.0–0.1)
Basophils Relative: 0.5 % (ref 0.0–3.0)
Eosinophils Absolute: 0 10*3/uL (ref 0.0–0.7)
Lymphocytes Relative: 19.7 % (ref 12.0–46.0)
MCHC: 33.9 g/dL (ref 30.0–36.0)
Neutrophils Relative %: 68.3 % (ref 43.0–77.0)
Platelets: 228 10*3/uL (ref 150.0–400.0)
RBC: 3.69 Mil/uL — ABNORMAL LOW (ref 3.87–5.11)

## 2012-07-18 LAB — URINALYSIS, ROUTINE W REFLEX MICROSCOPIC
Nitrite: NEGATIVE
Specific Gravity, Urine: >=1.03 (ref 1.000–1.030)
Urine Glucose: NEGATIVE
Urobilinogen, UA: 0.2 (ref 0.0–1.0)

## 2012-07-18 LAB — LIPID PANEL
Cholesterol: 276 mg/dL — ABNORMAL HIGH (ref 0–200)
Total CHOL/HDL Ratio: 4

## 2012-07-18 MED ORDER — LEVOTHYROXINE SODIUM 75 MCG PO TABS: 75.0000 ug | ORAL_TABLET | Freq: Every day | ORAL | Status: DC

## 2012-07-18 MED ORDER — VENLAFAXINE HCL ER 150 MG PO CP24: 150.0000 mg | ORAL_CAPSULE | Freq: Every morning | ORAL | Status: DC

## 2012-07-18 MED ORDER — LEVOTHYROXINE SODIUM 50 MCG PO TABS: 50.0000 ug | ORAL_TABLET | ORAL | Status: DC

## 2012-07-18 MED ORDER — VALACYCLOVIR HCL 500 MG PO TABS: 500.0000 mg | ORAL_TABLET | Freq: Every day | ORAL | Status: DC

## 2012-07-18 MED ORDER — FENOFIBRATE 145 MG PO TABS: 145.0000 mg | ORAL_TABLET | Freq: Every day | ORAL | Status: DC

## 2012-07-18 NOTE — Assessment & Plan Note (Signed)
Complicated hx, currently stable, off pred- [place] summer 2013 reviewed follows with GI regularly The current medical regimen is effective;  continue present plan and medications.

## 2012-07-18 NOTE — Progress Notes (Signed)
Subjective:    Patient ID: Tammy Kennedy, female    DOB: [birth year], 56 y.o.   MRN: [id number]  HPI  patient is here today for annual physical. Patient feels well and has no complaints.  Also reviewed chronic medical issues:  dyslipidemia -  intol of crestor, lipitor and vytorin trials due to side effects - caused constipation which has improved since off med -   crohns -currently off pred - the patient reports compliance with medication(s) as prescribed. Denies adverse side effects. Follows regularly with GI to manage same - no new bowel symptoms   hypothyroid - the patient reports compliance with medication(s) as prescribed. Denies adverse side effects.   Past Medical History  Diagnosis Date  . HYPOTHYROIDISM   . HYPERLIPIDEMIA     intol of statin trials  . ANEMIA   . ANXIETY DISORDER   . DEPRESSION   . CROHN'S DISEASE   . OSTEOPENIA   . ALCOHOL ABUSE, HX OF   . HEPATOMEGALY, HX OF   . DRY EYE SYNDROME    Family History  Problem Relation Age of Onset  . Adopted: Yes   History  Substance Use Topics  . Smoking status: Never Smoker   . Smokeless tobacco: Never Used   Comment: Works at [employer]- [employer]- part time  . Alcohol Use: 12.0 oz/week    20 Glasses of wine per week   Review of Systems Constitutional: Negative for fever.  Respiratory: Negative for cough and shortness of breath.   Cardiovascular: Negative for chest pain.  Gastrointestinal: Negative for abdominal pain.  Musculoskeletal: Negative for gait problem.  Skin: Negative for rash.  Neurological: Negative for dizziness.  No other specific complaints in a complete review of systems (except as listed in HPI above).    Objective:   Physical Exam  BP 128/90  Pulse 84  Temp 98 F (36.7 C) (Oral)  Ht 5' (1.524 m)  Wt 112 lb (50.803 kg)  BMI 21.87 kg/m2  SpO2 95% Wt Readings from Last 3 Encounters:  07/18/12 112 lb (50.803 kg)  05/06/12 115 lb (52.164 kg)  03/02/12 115  lb (52.164 kg)   Constitutional: She appears well-developed and well-nourished. No distress. slightly tremulous HENT: Head: Normocephalic and atraumatic. Ears: B TMs ok, no erythema or effusion; Nose: Nose normal. Mouth/Throat: Oropharynx is clear and moist. No oropharyngeal exudate.  Eyes: Conjunctivae and EOM are normal. Pupils are equal, round, and reactive to light. No scleral icterus.  Neck: Normal range of motion. Neck supple. No JVD present. No thyromegaly present.  Cardiovascular: Normal rate, regular rhythm and normal heart sounds.  No murmur heard. No BLE edema. Pulmonary/Chest: Effort normal and breath sounds normal. No respiratory distress. She has no wheezes.  Abdominal: Soft. Bowel sounds are normal. She exhibits no distension. There is no tenderness. no masses Musculoskeletal: Normal range of motion, no joint effusions. No gross deformities Neurological: She is alert and oriented to person, place, and time. No cranial nerve deficit. Coordination normal.  Skin: Skin is warm and dry. No rash noted. No erythema.  Psychiatric: She has a normal mood and affect. Her behavior is normal. Judgment and thought content normal.    Lab Results  Component Value Date   WBC 2.6* 03/02/2012   HGB 14.0 03/02/2012   HCT 41.7 03/02/2012   PLT 229.0 03/02/2012   CHOL 329* 01/07/2011   TRIG 419.0 Triglyceride is over 400; calculations on Lipids are invalid.* 01/07/2011   HDL 93.60 01/07/2011   LDLDIRECT 181.8  01/07/2011   ALT 19 03/02/2012   AST 27 03/02/2012   NA 142 03/02/2012   K 5.4* 03/02/2012   CL 104 03/02/2012   CREATININE 0.6 03/02/2012   BUN 11 03/02/2012   CO2 30 03/02/2012   TSH 0.48 03/02/2012      Assessment & Plan:   CPX/v70.0 - Patient has been counseled on age-appropriate routine health concerns for screening and prevention. These are reviewed and up-to-date. Immunizations are up-to-date or declined. Labs and ECG reviewed.  Also see problem list. Medications and labs reviewed  today.

## 2012-07-18 NOTE — Assessment & Plan Note (Signed)
Intolerant of statin therapy (constipation SE) and declines other rx therapy - Current labs reviewed; Continue diet/exercise efforts to control same - risks of ASD reviewed

## 2012-07-18 NOTE — Addendum Note (Signed)
Addended by: Gwendolyn Grant A on: 07/18/2012 08:52 PM   Modules accepted: Orders

## 2012-07-18 NOTE — Assessment & Plan Note (Signed)
Lab Results  Component Value Date   TSH 0.48 03/02/2012   On replacement: 50 mcg daily except 100 mcg weekly Check now and adjust as needed

## 2012-07-18 NOTE — Patient Instructions (Signed)
It was good to see you today. Health Maintenance reviewed - refer for mammogram and bone density screening -all other recommended immunizations and age-appropriate screenings are up-to-date or declined Test(s) ordered today. Your results will be released to [facility] (or called to you) after review, usually within 72hours after test completion. If any changes need to be made, you will be notified at that same time. Medications reviewed, no changes at this time. Refill on medication(s) as discussed today. Please schedule followup in 12 months, call sooner if problems.

## 2012-07-20 ENCOUNTER — Telehealth: Payer: Self-pay | Admitting: Internal Medicine

## 2012-07-20 NOTE — Telephone Encounter (Signed)
Pt informed of lab results. 

## 2012-07-20 NOTE — Telephone Encounter (Signed)
The patient called the triage line hoping to get her lab results.  The patient's callback is a work number at [phone] (till [place])

## 2012-09-14 ENCOUNTER — Other Ambulatory Visit: Payer: Self-pay | Admitting: *Deleted

## 2012-09-14 MED ORDER — AZATHIOPRINE 100 MG PO TABS: 100.0000 mg | ORAL_TABLET | Freq: Every day | ORAL | Status: DC

## 2012-10-24 ENCOUNTER — Telehealth: Payer: Self-pay | Admitting: Internal Medicine

## 2012-10-24 ENCOUNTER — Encounter: Payer: Self-pay | Admitting: Internal Medicine

## 2012-10-24 ENCOUNTER — Ambulatory Visit (INDEPENDENT_AMBULATORY_CARE_PROVIDER_SITE_OTHER): Payer: BC Managed Care – PPO | Admitting: Internal Medicine

## 2012-10-24 VITALS — BP 110/62 | HR 100 | Temp 99.0°F | Wt 112.0 lb

## 2012-10-24 DIAGNOSIS — T07XXXA Unspecified multiple injuries, initial encounter: Secondary | ICD-10-CM

## 2012-10-24 DIAGNOSIS — W5503XA Scratched by cat, initial encounter: Secondary | ICD-10-CM

## 2012-10-24 DIAGNOSIS — W64XXXA Exposure to other animate mechanical forces, initial encounter: Secondary | ICD-10-CM

## 2012-10-24 DIAGNOSIS — IMO0002 Reserved for concepts with insufficient information to code with codable children: Secondary | ICD-10-CM

## 2012-10-24 DIAGNOSIS — L03119 Cellulitis of unspecified part of limb: Secondary | ICD-10-CM

## 2012-10-24 MED ORDER — CEPHALEXIN 500 MG PO CAPS: 500.0000 mg | ORAL_CAPSULE | Freq: Three times a day (TID) | ORAL | Status: DC

## 2012-10-24 NOTE — Patient Instructions (Addendum)
It was good to see you today. Keflex antibiotics - Your prescription(s) have been submitted to your pharmacy. Please take as directed and contact our office if you believe you are having problem(s) with the medication(s). Alternate between ibuprofen and tylenol for aches, pain and fever symptoms as discussed follow up tomorrow afternoon as discussed for recheck if your symptoms continue to worsen (pain, fever, etc), or if you are unable take anything by mouth (pills, fluids, etc), you should go to the emergency room for further evaluation and treatment. Cellulitis Cellulitis is an infection of the skin and the tissue beneath it. The infected area is usually red and tender. Cellulitis occurs most often in the arms and lower legs.   CAUSES   Cellulitis is caused by bacteria that enter the skin through cracks or cuts in the skin. The most common types of bacteria that cause cellulitis are Staphylococcus and Streptococcus. SYMPTOMS    Redness and warmth.   Swelling.   Tenderness or pain.   Fever.  DIAGNOSIS  Your caregiver can usually determine what is wrong based on a physical exam. Blood tests may also be done. TREATMENT   Treatment usually involves taking an antibiotic medicine. HOME CARE INSTRUCTIONS    Take your antibiotics as directed. Finish them even if you start to feel better.   Keep the infected arm or leg elevated to reduce swelling.   Apply a warm cloth to the affected area up to 4 times per day to relieve pain.   Only take over-the-counter or prescription medicines for pain, discomfort, or fever as directed by your caregiver.   Keep all follow-up appointments as directed by your caregiver.  SEEK MEDICAL CARE IF:    You notice red streaks coming from the infected area.   Your red area gets larger or turns dark in color.   Your bone or joint underneath the infected area becomes painful after the skin has healed.   Your infection returns in the same area or another  area.   You notice a swollen bump in the infected area.   You develop new symptoms.  SEEK IMMEDIATE MEDICAL CARE IF:    You have a fever.   You feel very sleepy.   You develop vomiting or diarrhea.   You have a general ill feeling (malaise) with muscle aches and pains.  MAKE SURE YOU:    Understand these instructions.   Will watch your condition.   Will get help right away if you are not doing well or get worse.  Document Released: 07/08/2005 Document Revised: 03/29/2012 Document Reviewed: 12/14/2011 [facility] Patient Information 2013 [place].

## 2012-10-24 NOTE — Progress Notes (Signed)
  Subjective:    Patient ID: Tammy Kennedy, female    DOB: 1955/11/17, 57 y.o.   MRN: [id number]  HPI  complains of right hand/wrist swelling Precipitated by accidental cat scratch -known family cat, immunizations UTD Onset 3 days ago Pt wrapped forearm - increased swelling of hand/wrst since then associated with redness and pain, low grade fever  Unwrapped arm approximately 84mnutes before visit, not prior  Past Medical History  Diagnosis Date  . HYPOTHYROIDISM   . HYPERLIPIDEMIA     intol of statin trials  . ANEMIA   . ANXIETY DISORDER   . DEPRESSION   . CROHN'S DISEASE   . OSTEOPENIA   . ALCOHOL ABUSE, HX OF   . HEPATOMEGALY, HX OF   . DRY EYE SYNDROME    Review of Systems  Constitutional: Positive for fever. Negative for fatigue and unexpected weight change.  Musculoskeletal: Negative for joint swelling.  Skin: Positive for rash and wound (cat scratch induced).       Objective:   Physical Exam BP 110/62  Pulse 100  Temp 99 F (37.2 C) (Oral)  Wt 112 lb (50.803 kg)  SpO2 97% Wt Readings from Last 3 Encounters:  10/24/12 112 lb (50.803 kg)  07/18/12 112 lb (50.803 kg)  05/06/12 115 lb (52.164 kg)   Constitutional: She appears well-developed and well-nourished. No distress. nontoxic Cardiovascular: Normal rate, regular rhythm and normal heart sounds.  No murmur heard. No BLE edema. Pulmonary/Chest: Effort normal and breath sounds normal. No respiratory distress. She has no wheezes. Musculoskeletal: soft tissue swelling R wrist and hand - small puncture scratch at wrist, ext surface - tender to palpation over extensor surface of hand, flex/ext ligamentous function of digits and wrist intact but pain with extension of digits. Normal range of motion, no joint effusions. No gross deformities Neurological: She is alert and oriented to person, place, and time. No cranial nerve deficit. Coordination normal.  Skin: confluent mild erythema of wrist/hand - streaking  lymphedema over flex surface of forearm. Other skin is warm and dry.  Psychiatric: She has a normal mood and affect. Her behavior is normal. Judgment and thought content normal.   Lab Results  Component Value Date   WBC 4.6 07/18/2012   HGB 13.3 07/18/2012   HCT 39.3 07/18/2012   PLT 228.0 07/18/2012   GLUCOSE 99 07/18/2012   CHOL 276* 07/18/2012   TRIG 979.0* 07/18/2012   HDL 67.60 07/18/2012   LDLDIRECT 80.4 07/18/2012   LDLCALC 92 08/10/2007   ALT 68* 07/18/2012   AST 198* 07/18/2012   NA 135 07/18/2012   K 4.4 07/18/2012   CL 99 07/18/2012   CREATININE 0.7 07/18/2012   BUN 14 07/18/2012   CO2 28 07/18/2012   TSH 0.16* 07/18/2012       Assessment & Plan:   Cellulitis - hand/wrist induced by cat scratch  Swelling appears exacerbated by tight occlusive wrapping - advised NOT to resume same Keflex tid x 1 week, wash wound with warm soapy water and use ibuprofen/tylenol Recheck in 24-48h to ensure improvement of swelling/erythema - no signs compartment syndrome of deep soft tissue infection at present

## 2012-10-24 NOTE — Telephone Encounter (Signed)
Patient Information:  Caller Name: [place]  Phone: [phone]  Patient: Tammy Kennedy, Tammy Kennedy  Gender: Female  DOB: [birth year]  Age: 57 Years  PCP: Gwendolyn Grant (Adults only)  Office Follow Up:  Does the office need to follow up with this patient?: No  Instructions For The Office: N/A  RN Note:  Referenced Animal Bite guideline, disposition: see ED (downgraded due to office being open) for "Any full thickness puncture wound."  Symptoms  Reason For Call & Symptoms: Patient was scratched by her cat on Saturday on the right wrist. Reports hand is doubled in size, painful, and she feels like she has a fever. Reports feeling lightheaded at times. Denies trouble breathing.  Reviewed Health History In EMR: Yes  Reviewed Medications In EMR: Yes  Reviewed Allergies In EMR: Yes  Reviewed Surgeries / Procedures: Yes  Date of Onset of Symptoms: 10/22/2012  Treatments Tried: Advil taken for pain  Treatments Tried Worked: Yes  Any Fever: Yes  Fever Taken: Tactile  Fever Time Of Reading: 12:12:56  Fever Last Reading: N/A  Guideline(s) Used:  No Protocol Available - Sick Adult  Disposition Per Guideline:   See Today in Office  Reason For Disposition Reached:   Nursing judgment  Advice Given:  Call Back If:  New symptoms develop  You become worse.  Appointment Scheduled:  10/24/2012 16:15:00 Appointment Scheduled Provider:  Gwendolyn Grant (Adults only)

## 2012-10-25 ENCOUNTER — Encounter: Payer: Self-pay | Admitting: Internal Medicine

## 2012-10-25 ENCOUNTER — Ambulatory Visit (INDEPENDENT_AMBULATORY_CARE_PROVIDER_SITE_OTHER): Payer: BC Managed Care – PPO | Admitting: Internal Medicine

## 2012-10-25 VITALS — BP 92/62 | HR 96 | Temp 99.3°F | Ht 60.0 in | Wt 111.8 lb

## 2012-10-25 DIAGNOSIS — L0291 Cutaneous abscess, unspecified: Secondary | ICD-10-CM

## 2012-10-25 DIAGNOSIS — L0390 Cellulitis, unspecified: Secondary | ICD-10-CM

## 2012-10-25 MED ORDER — CEFTRIAXONE SODIUM 1 G IJ SOLR
1.0000 g | Freq: Once | INTRAMUSCULAR | Status: AC
  Administered 2012-10-25: 1 g via INTRAMUSCULAR

## 2012-10-25 NOTE — Patient Instructions (Signed)
Cellulitis Cellulitis is an infection of the skin and the tissue beneath it. The infected area is usually red and tender. Cellulitis occurs most often in the arms and lower legs.   CAUSES   Cellulitis is caused by bacteria that enter the skin through cracks or cuts in the skin. The most common types of bacteria that cause cellulitis are Staphylococcus and Streptococcus. SYMPTOMS    Redness and warmth.   Swelling.   Tenderness or pain.   Fever.  DIAGNOSIS  Your caregiver can usually determine what is wrong based on a physical exam. Blood tests may also be done. TREATMENT   Treatment usually involves taking an antibiotic medicine. HOME CARE INSTRUCTIONS    Take your antibiotics as directed. Finish them even if you start to feel better.   Keep the infected arm or leg elevated to reduce swelling.   Apply a warm cloth to the affected area up to 4 times per day to relieve pain.   Only take over-the-counter or prescription medicines for pain, discomfort, or fever as directed by your caregiver.   Keep all follow-up appointments as directed by your caregiver.  SEEK MEDICAL CARE IF:    You notice red streaks coming from the infected area.   Your red area gets larger or turns dark in color.   Your bone or joint underneath the infected area becomes painful after the skin has healed.   Your infection returns in the same area or another area.   You notice a swollen bump in the infected area.   You develop new symptoms.  SEEK IMMEDIATE MEDICAL CARE IF:    You have a fever.   You feel very sleepy.   You develop vomiting or diarrhea.   You have a general ill feeling (malaise) with muscle aches and pains.  MAKE SURE YOU:    Understand these instructions.   Will watch your condition.   Will get help right away if you are not doing well or get worse.  Document Released: 07/08/2005 Document Revised: 03/29/2012 Document Reviewed: 12/14/2011 [facility] Patient Information 2013  [place].

## 2012-10-25 NOTE — Progress Notes (Signed)
Subjective:    Patient ID: Tammy Kennedy, female    DOB: May 04, 1956, 57 y.o.   MRN: [id number]  HPI  Pt presents to the clinic today to f/u from a cat scratch or her right arm. She was seen Dr. Asa Lente yesterday who noted that she had mild cellulitis but exaggerated swelling and erythema due to the patient wrapping her arm too tight with an ace wrap. She was given Keflex for the cellulitis and told to come back in 24-28 to make sure the swelling and erythema has gone down. Today, the swelling has gone down but the erythema has extended up to the elbow. The continues to have fevers.   Review of Systems      Past Medical History  Diagnosis Date  . HYPOTHYROIDISM   . HYPERLIPIDEMIA     intol of statin trials  . ANEMIA   . ANXIETY DISORDER   . DEPRESSION   . CROHN'S DISEASE   . OSTEOPENIA   . ALCOHOL ABUSE, HX OF   . HEPATOMEGALY, HX OF   . DRY EYE SYNDROME     Current Outpatient Prescriptions  Medication Sig Dispense Refill  . azathioprine (AZASAN) 100 MG tablet Take 1 tablet (100 mg total) by mouth daily.  30 tablet  1  . cephALEXin (KEFLEX) 500 MG capsule Take 1 capsule (500 mg total) by mouth 3 (three) times daily.  21 capsule  0  . fenofibrate (TRICOR) 145 MG tablet Take 1 tablet (145 mg total) by mouth daily.  30 tablet  3  . folic acid (FOLVITE) 1 MG tablet Take 1 tablet (1 mg total) by mouth daily.  90 tablet  3  . levothyroxine (SYNTHROID) 75 MCG tablet Take 1 tablet (75 mcg total) by mouth daily. (plus additional 100mg on Sunday)  30 tablet  11  . levothyroxine (SYNTHROID, LEVOTHROID) 50 MCG tablet Take 1 tablet (50 mcg total) by mouth once a week.  4 tablet  11  . LORazepam (ATIVAN) 0.5 MG tablet Take 0.5 mg by mouth daily as needed.        . Potassium Gluconate 550 MG TABS Take 1 tablet by mouth daily.      . Sennosides (EX-LAX PO) Take by mouth as directed.      . valACYclovir (VALTREX) 500 MG tablet Take 1 tablet (500 mg total) by mouth daily.  30 tablet  11  .  venlafaxine XR (EFFEXOR-XR) 150 MG 24 hr capsule Take 1 capsule (150 mg total) by mouth every morning.  30 capsule  11  . vitamin B-12 (CYANOCOBALAMIN) 500 MCG tablet Take 500 mcg by mouth daily.          No Known Allergies  Family History  Problem Relation Age of Onset  . Adopted: Yes  . Alzheimer's disease Mother 819   History   Social History  . Marital Status: Divorced    Spouse Name: N/A    Number of Children: N/A  . Years of Education: N/A   Occupational History  . Not on file.   Social History Main Topics  . Smoking status: Never Smoker   . Smokeless tobacco: Never Used     Comment: Works at [employer] part time  . Alcohol Use: 12.0 oz/week    20 Glasses of wine per week  . Drug Use: No  . Sexually Active: Not on file   Other Topics Concern  . Not on file   Social History Narrative  . No narrative on  file     Constitutional: Denies fever, malaise, fatigue, headache or abrupt weight changes.  Musculoskeletal: Denies decrease in range of motion, difficulty with gait, muscle pain or joint pain and swelling.  Skin: Pt reports cat scratch on right arm. Increase in redness. Denies rashes, lesions or ulcercations.  Neurological: Denies dizziness, difficulty with memory, difficulty with speech or problems with balance and coordination.   No other specific complaints in a complete review of systems (except as listed in HPI above).  Objective:   Physical Exam  BP 92/62  Pulse 96  Temp 99.3 F (37.4 C) (Oral)  Ht 5' (1.524 m)  Wt 111 lb 12 oz (50.689 kg)  BMI 21.82 kg/m2  SpO2 97% Wt Readings from Last 3 Encounters:  10/25/12 111 lb 12 oz (50.689 kg)  10/24/12 112 lb (50.803 kg)  07/18/12 112 lb (50.803 kg)    General: Appears her stated age, well developed, well nourished in NAD. Skin: Cat scratch scabbed over. Swelling 2+ non pitting edema of the hand and wrist. Erythema extending up to the elbow. Cardiovascular: Normal  rate and rhythm. S1,S2 noted.  No murmur, rubs or gallops noted. No JVD or BLE edema. No carotid bruits noted. Pulmonary/Chest: Normal effort and positive vesicular breath sounds. No respiratory distress. No wheezes, rales or ronchi noted.  Musculoskeletal: Normal range of motion. No signs of joint swelling. No difficulty with gait.        Assessment & Plan:   Cellulitis, secondary to cat scratch, complicated by erythema and swelling:  Continue with Keflex 1gm Rocephin IM today Avoid wrapping arm with ace bandage Continue to clean with warm soap and water  RTC on Friday for reassessment

## 2012-10-25 NOTE — Addendum Note (Signed)
Addended by: Legrand Como on: 10/25/2012 02:01 PM   Modules accepted: Orders

## 2012-10-26 NOTE — Progress Notes (Addendum)
  Subjective:    Patient ID: Tammy Kennedy, female    DOB: 13-Nov-1955, 57 y.o.   MRN: [id number]  CC: Cat scratch with redness and swelling of R wrist and hand  HPI Pt reports cat scratch on Saturday 10/22/12.  Pt stopped bleeding within a few minutes and applied a bandage to the affected wrist.  Pt noted some swelling and minor redness as early as the following morning.  By Monday morning pt reported significant swelling and redness of hand and wrist.  Pt states area is very tender and painful with limited use of hand.  Pt reports taking some advil with little relief.  Pt has not taken temperature but has felt hot off and on and also reports some dizziness.  Cat is 68 years old and familiar to the patient as well as being an indoor cat with no recent exotic exposures.   Review of Systems  Constitutional: Positive for fever. Negative for chills and fatigue.  Gastrointestinal: Negative for nausea.  Skin: Positive for color change and wound.  Neurological: Positive for dizziness. Negative for numbness.       Objective:   Physical Exam  Constitutional: She appears well-developed and well-nourished. She is active. No distress.  Cardiovascular: Normal rate, regular rhythm and normal heart sounds.   Pulses:      Radial pulses are 2+ on the right side, and 2+ on the left side.  Pulmonary/Chest: Effort normal and breath sounds normal.  Musculoskeletal:       Right wrist: She exhibits decreased range of motion, tenderness and swelling.  Neurological: She is alert.  Skin: Skin is warm and dry.       R hand with significant erythema and edema extending to wrist circumferentially and streaks on ulnar aspect to mid forearm.  Hand has increased warmth.  Scratches at wrist small and scabbed over with no exudate present.      Assessment & Plan:  Cellulitis of the right hand- concern for increased swelling due to unyielding bandaging of wrist.  Pt advised use of large band-aid over the area after  cleansing with soap and warm water.  Pt instructed to keep extremity elevated and minimize bandaging.  Pt may continue advil at home for swelling.  Will prescribe Cephalexin 500 mg PO TID x 7 days with office follow-up in 1-2 days to ensure healing.  Advised to call office if any increase in symptoms(redness, swelling, pain).  Vickie Epley PA-C

## 2012-10-28 ENCOUNTER — Ambulatory Visit (INDEPENDENT_AMBULATORY_CARE_PROVIDER_SITE_OTHER): Payer: BC Managed Care – PPO | Admitting: Internal Medicine

## 2012-10-28 ENCOUNTER — Encounter: Payer: Self-pay | Admitting: Internal Medicine

## 2012-10-28 VITALS — BP 110/72 | HR 84 | Temp 98.1°F | Ht 60.0 in | Wt 111.2 lb

## 2012-10-28 DIAGNOSIS — E781 Pure hyperglyceridemia: Secondary | ICD-10-CM

## 2012-10-28 MED ORDER — FENOFIBRATE 145 MG PO TABS: 145.0000 mg | ORAL_TABLET | Freq: Every day | ORAL | Status: DC

## 2012-10-28 NOTE — Progress Notes (Signed)
Subjective:    Patient ID: Tammy Kennedy, female    DOB: 05-23-1956, 57 y.o.   MRN: [id number]  HPI   Pt presents to the clinic today to f/u from a cat scratch or her right arm. She was seen Dr. Asa Lente on 10/24/2012, who noted that she had mild cellulitis but exaggerated swelling and erythema due to the patient wrapping her arm too tight with an ace wrap. She was given Keflex for the cellulitis and told to come back in 24-28 to make sure the swelling and erythema has gone down. On 10/26/2011, the swelling has gone down but the erythema has extended up to the elbow. She continues to have fevers.  Today, the swelling and redness have gone down tremendously.  Review of Systems       Past Medical History  Diagnosis Date  . HYPOTHYROIDISM   . HYPERLIPIDEMIA     intol of statin trials  . ANEMIA   . ANXIETY DISORDER   . DEPRESSION   . CROHN'S DISEASE   . OSTEOPENIA   . ALCOHOL ABUSE, HX OF   . HEPATOMEGALY, HX OF   . DRY EYE SYNDROME     Current Outpatient Prescriptions  Medication Sig Dispense Refill  . azathioprine (AZASAN) 100 MG tablet Take 1 tablet (100 mg total) by mouth daily.  30 tablet  1  . cephALEXin (KEFLEX) 500 MG capsule Take 1 capsule (500 mg total) by mouth 3 (three) times daily.  21 capsule  0  . fenofibrate (TRICOR) 145 MG tablet Take 1 tablet (145 mg total) by mouth daily.  30 tablet  3  . folic acid (FOLVITE) 1 MG tablet Take 1 tablet (1 mg total) by mouth daily.  90 tablet  3  . levothyroxine (SYNTHROID) 75 MCG tablet Take 1 tablet (75 mcg total) by mouth daily. (plus additional 36mg on Sunday)  30 tablet  11  . levothyroxine (SYNTHROID, LEVOTHROID) 50 MCG tablet Take 1 tablet (50 mcg total) by mouth once a week.  4 tablet  11  . LORazepam (ATIVAN) 0.5 MG tablet Take 0.5 mg by mouth daily as needed.        . Potassium Gluconate 550 MG TABS Take 1 tablet by mouth daily.      . Sennosides (EX-LAX PO) Take by mouth as directed.      . valACYclovir (VALTREX) 500  MG tablet Take 1 tablet (500 mg total) by mouth daily.  30 tablet  11  . venlafaxine XR (EFFEXOR-XR) 150 MG 24 hr capsule Take 1 capsule (150 mg total) by mouth every morning.  30 capsule  11  . vitamin B-12 (CYANOCOBALAMIN) 500 MCG tablet Take 500 mcg by mouth daily.          No Known Allergies  Family History  Problem Relation Age of Onset  . Adopted: Yes  . Alzheimer's disease Mother 813   History   Social History  . Marital Status: Divorced    Spouse Name: N/A    Number of Children: N/A  . Years of Education: N/A   Occupational History  . Not on file.   Social History Main Topics  . Smoking status: Never Smoker   . Smokeless tobacco: Never Used     Comment: Works at [employer] part time  . Alcohol Use: 12.0 oz/week    20 Glasses of wine per week  . Drug Use: No  . Sexually Active: Not on file   Other Topics Concern  .  Not on file   Social History Narrative  . No narrative on file     Constitutional: Denies fever, malaise, fatigue, headache or abrupt weight changes.  Musculoskeletal: Denies decrease in range of motion, difficulty with gait, muscle pain or joint pain and swelling.  Skin: Pt reports cat scratch on right arm. Denies rashes, lesions or ulcercations.  Neurological: Denies dizziness, difficulty with memory, difficulty with speech or problems with balance and coordination.   No other specific complaints in a complete review of systems (except as listed in HPI above).  Objective:   Physical Exam    Wt Readings from Last 3 Encounters:  10/25/12 111 lb 12 oz (50.689 kg)  10/24/12 112 lb (50.803 kg)  07/18/12 112 lb (50.803 kg)    General: Appears her stated age, well developed, well nourished in NAD. Skin: Cat scratch scabbed over. Swelling 1+ non pitting edema of the hand and wrist. Erythema just over the anterior aspect of the hand. Cardiovascular: Normal rate and rhythm. S1,S2 noted.  No murmur, rubs or gallops  noted. No JVD or BLE edema. No carotid bruits noted. Pulmonary/Chest: Normal effort and positive vesicular breath sounds. No respiratory distress. No wheezes, rales or ronchi noted.  Musculoskeletal: Normal range of motion. No signs of joint swelling. No difficulty with gait.        Assessment & Plan:   Cellulitis, secondary to cat scratch, complicated by erythema and swelling:  Continue with Keflex Avoid wrapping arm with ace bandage Continue to clean with warm soap and water  RTC as needed

## 2012-11-03 ENCOUNTER — Telehealth: Payer: Self-pay | Admitting: Internal Medicine

## 2012-11-03 NOTE — Telephone Encounter (Signed)
Patient Information:  Caller Name: [place]  Phone: [phone]  Patient: Tammy Kennedy, Tammy Kennedy  Gender: Female  DOB: 1956/05/01  Age: 57 Years  PCP: Webb Silversmith  Office Follow Up:  Does the office need to follow up with this patient?: Yes  Instructions For The Office: Please follow up on script request - see nurses note for details.  RN Note:  Scratch is two small puncture wounds. Mildly tender when touched, warm to the touch, redness to area 2 1/2 inches square to site. No drainage from area.  Patient was recently seen for a scratch to another location from cat and had to be treated with antibiotics due to infection. Patient is a personal per and no risk of rabies. Patient does not wish to be seen but would like to know if she should go on antibiotics again due to new wound and redness.  Informed patient that the recommendation is to be seen and that message will be submitted to the provider however office visit may still be recommended by the provider for evaluation prior to administering antibiotics.  Patient states understanding.  Symptoms  Reason For Call & Symptoms: Cat scratch to left hand around wrist area  Reviewed Health History In EMR: Yes  Reviewed Medications In EMR: Yes  Reviewed Allergies In EMR: Yes  Reviewed Surgeries / Procedures: Yes  Date of Onset of Symptoms: 11/02/2012  Treatments Tried: Cleansed with antibacterial soap and placed neosporin to area  Treatments Tried Worked: No  Guideline(s) Used:  Academic librarian  Disposition Per Guideline:   Go to Office Now  Reason For Disposition Reached:   Puncture wound or small cut on hands or genitals  Advice Given:  Call Back If:  Wound begins to look infected (redness, swelling, warmth, tender to touch, or red streaks)  You become worse.  Patient Refused Recommendation:  Patient Requests Prescription  Patient is requesting a script for antibiotics for new cat scratch wound due to recently treating a previous infection  from a cat scratch earlier this month.  Patient was on Keflex for prior incident.

## 2012-11-03 NOTE — Telephone Encounter (Signed)
Tammy Kennedy, She needs a OV. Last time she required an IM injection of antibiotics. Rollene Fare

## 2012-11-03 NOTE — Telephone Encounter (Signed)
Notified pt with NP response. Transferred to schedulers to make appt...lmb

## 2012-11-04 ENCOUNTER — Encounter: Payer: Self-pay | Admitting: Internal Medicine

## 2012-11-04 ENCOUNTER — Ambulatory Visit (INDEPENDENT_AMBULATORY_CARE_PROVIDER_SITE_OTHER): Payer: BC Managed Care – PPO | Admitting: Internal Medicine

## 2012-11-04 VITALS — BP 120/82 | HR 72 | Temp 97.8°F | Wt 112.0 lb

## 2012-11-04 DIAGNOSIS — S60819A Abrasion of unspecified wrist, initial encounter: Secondary | ICD-10-CM

## 2012-11-04 DIAGNOSIS — L02519 Cutaneous abscess of unspecified hand: Secondary | ICD-10-CM

## 2012-11-04 DIAGNOSIS — L03119 Cellulitis of unspecified part of limb: Secondary | ICD-10-CM

## 2012-11-04 DIAGNOSIS — IMO0002 Reserved for concepts with insufficient information to code with codable children: Secondary | ICD-10-CM

## 2012-11-04 DIAGNOSIS — W64XXXA Exposure to other animate mechanical forces, initial encounter: Secondary | ICD-10-CM

## 2012-11-04 MED ORDER — CEFTRIAXONE SODIUM 1 G IJ SOLR
1.0000 g | Freq: Once | INTRAMUSCULAR | Status: AC
  Administered 2012-11-04: 1 g via INTRAMUSCULAR

## 2012-11-04 MED ORDER — CEPHALEXIN 500 MG PO CAPS: 500.0000 mg | ORAL_CAPSULE | Freq: Three times a day (TID) | ORAL | Status: DC

## 2012-11-04 NOTE — Progress Notes (Signed)
  Subjective:    Patient ID: Tammy Kennedy, female    DOB: 1955-11-02, 57 y.o.   MRN: [id number]  CC: Cat scratch to LEFT wrist HPI Pt presented today with cat scratch to left posterior wrist. Injury occurred 2 days ago. C/o warmth, tenderness and expanding redness at the site.  Pt reports this is a scratch and not a bite and that the cat is a family pet and up to date on required shots.  Pt recently seen for cellulitis of R wrist and hand.  Treated for such with TID keflex and rocephin IM injection.  Symptoms resolved.  No other complaints for R wrist. Past Medical History  Diagnosis Date  . HYPOTHYROIDISM   . HYPERLIPIDEMIA     intol of statin trials  . ANEMIA   . ANXIETY DISORDER   . DEPRESSION   . CROHN'S DISEASE   . OSTEOPENIA   . ALCOHOL ABUSE, HX OF   . HEPATOMEGALY, HX OF   . DRY EYE SYNDROME      Review of Systems  Constitutional: Negative for fever, chills and unexpected weight change.  Respiratory: Negative for chest tightness and shortness of breath.   Musculoskeletal: Negative for myalgias and joint swelling.       Objective:   Physical Exam  Constitutional: She is oriented to person, place, and time. She appears well-developed and well-nourished. No distress.  Cardiovascular: Normal rate, regular rhythm and normal heart sounds.   Pulmonary/Chest: Effort normal and breath sounds normal. No respiratory distress. She has no wheezes.  Neurological: She is alert and oriented to person, place, and time.  Skin: Skin is warm and dry. There is erythema.       Erythema, mild swelling(+1) and warmth of left posterior wrist.  Scratches are small and healing with no drainage.   BP 120/82  Pulse 72  Temp 97.8 F (36.6 C) (Oral)  Wt 112 lb (50.803 kg)  SpO2 99%      Assessment & Plan:  Pt evaluated for cellulitis of left wrist due to cat scratch today.  Pt given IM dose of rocephin 1 gm and prescribed cephalexin 55m TID for same.  Pt educated to call if worsening  redness, warmth, or pain at site.  Evaluated Right wrist and hand for resolution of previous cellulitis related to cat scratch and noted resolution of symptoms and evidence of infection.  Pt reports no questions at this time and instructed to call the office with any issues or concerns.   --- Agree with note as above-described by MVickie Epley PA student for me, Dr. LAsa Lente I have personally taken a history, performed exam and developed the assessment and plan with medical decision-making as outlined.  Exam: Gen: nontoxic  Skin: Mild erythematous patch over left wrist surrounding superficial scratch. No ulceration. No fluctuance or abscess. Minimal soft tissue swelling with mild abnormal warmth. Mskel: Wrist without effusion, full range of motion without pain  A/P: Cellulitis, left wrist -cat scratch induced. IM Rocephin today, continue 5 days Ceftin. Symptomatic care and elevation, patient educated on red flags for which to call for reevaluation as needed  VGwendolyn Grant MD

## 2012-11-04 NOTE — Patient Instructions (Signed)
It was good to see you today. IM Rocephin given today for early cellulitis (infection) of left wrist skin due to cat scratch Take Keflex 3 times a day for next 5 days to complete antibody treatment for infection -Your prescription(s) have been submitted to your pharmacy. Please take as directed and contact our office if you believe you are having problem(s) with the medication(s). Keep wrist elevated, use warm compress or ice pack to the affected area as needed and call if increased redness, increased swelling, increased pain or other problems Please avoid future injury bite or scratch from your cat!!! Be careful

## 2013-01-23 ENCOUNTER — Other Ambulatory Visit: Payer: Self-pay | Admitting: *Deleted

## 2013-01-23 MED ORDER — AZATHIOPRINE 100 MG PO TABS: 100.0000 mg | ORAL_TABLET | Freq: Every day | ORAL | Status: DC

## 2013-02-02 ENCOUNTER — Encounter: Payer: Self-pay | Admitting: *Deleted

## 2013-02-07 ENCOUNTER — Telehealth: Payer: Self-pay | Admitting: Internal Medicine

## 2013-02-07 NOTE — Telephone Encounter (Signed)
Patient will come in and see Alonza Bogus, PA tomorrow at 9:15

## 2013-02-08 ENCOUNTER — Other Ambulatory Visit (INDEPENDENT_AMBULATORY_CARE_PROVIDER_SITE_OTHER): Payer: BC Managed Care – PPO

## 2013-02-08 ENCOUNTER — Encounter: Payer: Self-pay | Admitting: Gastroenterology

## 2013-02-08 ENCOUNTER — Ambulatory Visit (INDEPENDENT_AMBULATORY_CARE_PROVIDER_SITE_OTHER): Payer: BC Managed Care – PPO | Admitting: Gastroenterology

## 2013-02-08 VITALS — BP 104/78 | HR 79 | Ht 59.75 in | Wt 110.0 lb

## 2013-02-08 DIAGNOSIS — K625 Hemorrhage of anus and rectum: Secondary | ICD-10-CM

## 2013-02-08 DIAGNOSIS — R197 Diarrhea, unspecified: Secondary | ICD-10-CM

## 2013-02-08 DIAGNOSIS — K5090 Crohn's disease, unspecified, without complications: Secondary | ICD-10-CM

## 2013-02-08 LAB — CBC WITH DIFFERENTIAL/PLATELET
Basophils Absolute: 0.1 10*3/uL (ref 0.0–0.1)
Eosinophils Absolute: 0.2 10*3/uL (ref 0.0–0.7)
Lymphs Abs: 0.8 10*3/uL (ref 0.7–4.0)
MCHC: 34.9 g/dL (ref 30.0–36.0)
MCV: 101.7 fl — ABNORMAL HIGH (ref 78.0–100.0)
Monocytes Absolute: 0.6 10*3/uL (ref 0.1–1.0)
Neutrophils Relative %: 78.7 % — ABNORMAL HIGH (ref 43.0–77.0)
Platelets: 318 10*3/uL (ref 150.0–400.0)
RDW: 13.1 % (ref 11.5–14.6)
WBC: 7.7 10*3/uL (ref 4.5–10.5)

## 2013-02-08 LAB — SEDIMENTATION RATE: Sed Rate: 32 mm/hr — ABNORMAL HIGH (ref 0–22)

## 2013-02-08 LAB — FOLATE: Folate: >24.8 ng/mL (ref >5.9)

## 2013-02-08 LAB — COMPREHENSIVE METABOLIC PANEL
ALT: 10 U/L (ref 0–35)
AST: 18 U/L (ref 0–37)
BUN: 14 mg/dL (ref 6–23)
Creatinine, Ser: 0.7 mg/dL (ref 0.4–1.2)
Total Bilirubin: 0.5 mg/dL (ref 0.3–1.2)

## 2013-02-08 MED ORDER — PREDNISONE 10 MG PO TABS: ORAL_TABLET | ORAL | Status: DC

## 2013-02-08 NOTE — Progress Notes (Signed)
Left message with female for patient to call back.

## 2013-02-08 NOTE — Progress Notes (Signed)
02/08/2013 Tammy Kennedy [id number] Jan 20, 1956   History of Present Illness: This is a 57 year old white female who is a patient of Dr. Nichola Sizer.   She has Crohn's colitis and ileitis with rectal involvement since the 1980s. Her last flareup was 4 years ago. She has been on maintenance dose of Imuran 50 mg a day and on folic BPJP/E16 supplements. She developed a rectovaginal fistula about two years ago.  Her colonoscopy in April 2007 showed pseudopolyps and active colitis. Biopsies from the terminal ileum were normal at that time.  Her last colonoscopy was in 04/2012 showed pseudopolyps and a fistula in her rectum but no active disease; biopsies were negative for active disease as well.  Her last prednisone taper was in September 2010. An upper abdominal ultrasound in December 2009 showed a liver cyst but no evidence of parenchymal liver disease. Her spleen was 5.5 cm. A small bowel follow-through in June 2007 showed rapid transit time with distal small bowel diverticula. She had been doing well until sometime in March at which time she started having diarrhea with rectal bleeding.  Has what looks like "bloody tissue".  Some nausea at times but no vomiting.  No documented fever and no chills.  She is having 4-6 BM's per day.  She says that she was on abx (keflex) back in January.  No abdominal pain but she does have pain in her rectum.    Current Medications, Allergies, Past Medical History, Past Surgical History, Family History and Social History were reviewed in [employer] record.   Physical Exam: BP 104/78  Pulse 79  Ht 4' 11.75" (1.518 m)  Wt 110 lb (49.896 kg)  BMI 21.65 kg/m2 General: Well developed, white female in no acute distress Head: Normocephalic and atraumatic Eyes:  sclerae anicteric, conjunctiva pink  Ears: Normal auditory acuity Lungs: Clear throughout to auscultation Heart: Regular rate and rhythm Abdomen: Soft, non-tender and non-distended. No masses,  no hepatomegaly. Normal bowel sounds. Musculoskeletal: Symmetrical with no gross deformities  Extremities: No edema  Neurological: Alert oriented x 4, grossly nonfocal Psychological:  Alert and cooperative. Normal mood and affect  Assessment and Recommendations: -Crohn's disease with diarrhea, rectal bleeding, and rectal pain:  Will place her on prednisone 40 mg daily for two weeks, then 30 mg daily for a week, then 20 mg daily for a week, then 10 mg daily for a week.  She will continue her current dose of Imuran at 50 mg daily.  She will have labs today including a CBC, CMP, sed rate, CRP, B12, and folate.  Will check stool for Cdiff as well.  She will call with an update in two weeks before starting her prednisone taper.  She has an appointment with Dr. Olevia Perches on 6/4 which she will keep for follow-up.

## 2013-02-08 NOTE — Progress Notes (Signed)
Reviewed and agree with everything.

## 2013-02-08 NOTE — Patient Instructions (Signed)
Your physician has requested that you go to the basement for lab work before leaving today.  We have sent the following medications to your pharmacy for you to pick up at your convenience: Prednisone  Call us in 2 weeks with an update.  Keep your follow up appointment with Dr. Delfin Edis on 03/15/2013.  Thank you for choosing me and [facility].  Alonza Bogus, PA-C

## 2013-02-09 ENCOUNTER — Other Ambulatory Visit: Payer: BC Managed Care – PPO

## 2013-02-09 DIAGNOSIS — K5090 Crohn's disease, unspecified, without complications: Secondary | ICD-10-CM

## 2013-02-09 DIAGNOSIS — R197 Diarrhea, unspecified: Secondary | ICD-10-CM

## 2013-02-10 ENCOUNTER — Telehealth: Payer: Self-pay | Admitting: *Deleted

## 2013-02-10 LAB — CLOSTRIDIUM DIFFICILE BY PCR: Toxigenic C. Difficile by PCR: DETECTED — CR

## 2013-02-10 MED ORDER — METRONIDAZOLE 500 MG PO TABS: 500.0000 mg | ORAL_TABLET | Freq: Three times a day (TID) | ORAL | Status: DC

## 2013-02-10 NOTE — Telephone Encounter (Signed)
Patient has been advised that her C Diff is positive, therefore, she should start taking Flagyl (which she was advised to pick up asap at the pharmacy) and discontinue prednisone previously given to her at her office visit. I have also advised patient that C Diff is very contagious, therefore, she needs to be extra diligent in hand washing hygiene etc. Also advised that she should make her family aware so they will practice hand washing as well. She verbalizes understanding.

## 2013-02-10 NOTE — Telephone Encounter (Signed)
Message copied by Larina Bras on Fri Feb 10, 2013  3:40 PM ------      Message from: Alonza Bogus D      Created: Fri Feb 10, 2013  2:51 PM       Patient is positive for Cdiff.  Please give her flagyl 500 mg TID for 14 days.  She needs to stop the prednisone that she was given at her visit as well.            Thank you,            Jess ------

## 2013-03-15 ENCOUNTER — Encounter: Payer: Self-pay | Admitting: Internal Medicine

## 2013-03-15 ENCOUNTER — Other Ambulatory Visit: Payer: BC Managed Care – PPO

## 2013-03-15 ENCOUNTER — Ambulatory Visit (INDEPENDENT_AMBULATORY_CARE_PROVIDER_SITE_OTHER): Payer: BC Managed Care – PPO | Admitting: Internal Medicine

## 2013-03-15 VITALS — BP 90/68 | HR 88 | Ht 59.0 in | Wt 109.2 lb

## 2013-03-15 DIAGNOSIS — K5010 Crohn's disease of large intestine without complications: Secondary | ICD-10-CM

## 2013-03-15 DIAGNOSIS — N824 Other female intestinal-genital tract fistulae: Secondary | ICD-10-CM

## 2013-03-15 DIAGNOSIS — D849 Immunodeficiency, unspecified: Secondary | ICD-10-CM

## 2013-03-15 DIAGNOSIS — Z23 Encounter for immunization: Secondary | ICD-10-CM

## 2013-03-15 DIAGNOSIS — N823 Fistula of vagina to large intestine: Secondary | ICD-10-CM

## 2013-03-15 NOTE — Patient Instructions (Addendum)
Go to the basement for labs today Your Bone Density test is 03/16/2013 at 10am at [facility] We have given you a Pneumococcal vaccine today  Dr Asa Lente

## 2013-03-15 NOTE — Progress Notes (Signed)
Tammy Kennedy [birth year] MRN [id number]        History of Present Illness:  This is a 57 year old white female with the Crohn's disease of the colon and terminal ileum and rectovaginal fistula. Last colonoscopy in July 2013 confirmed rectovaginal fistula, pseudopolyps and adenomatous polyp. Prior colonoscopies were in 2003 and 2007. She's currently in remission. She was seen here on emergency basis 6 weeks ago with acute onset of diarrhea and tested positive for C. difficile toxin. She was treated with Flagyl 250 mg 4 times a day for 10 days with complete resolution of her symptoms. She currently denies any rectal bleeding, abdominal pain or diarrhea. She has continued on Imuran 50 mg daily. She was briefly on Remicade infusions but did developed severe myalgias and the Remicade was discontinued.   Past Medical History  Diagnosis Date  . HYPOTHYROIDISM   . HYPERLIPIDEMIA     intol of statin trials  . ANEMIA   . ANXIETY DISORDER   . DEPRESSION   . CROHN'S DISEASE   . OSTEOPENIA   . ALCOHOL ABUSE, HX OF   . HEPATOMEGALY, HX OF   . DRY EYE SYNDROME   . Tubular adenoma 2013  . C. difficile colitis    Past Surgical History  Procedure Laterality Date  . Anal fissure repair    . Hemorrhoid surgery    . Theraputic abortion    . Mohs surgery  2008    Nose BCC  . Left thumb surgery  1989    reports that she has never smoked. She has never used smokeless tobacco. She reports that she drinks about 12.0 ounces of alcohol per week. She reports that she does not use illicit drugs. family history includes Alzheimer's disease (age of onset: 36) in her mother. She is adopted. No Known Allergies      Review of Systems: Denies dysphagia chest pain rectal bleeding  The remainder of the 10 point ROS is negative except as outlined in H&P   Physical Exam: General appearance  Well developed, in no distress. Eyes- non icteric. HEENT nontraumatic, normocephalic. Mouth no lesions, tongue  papillated, no cheilosis. Neck supple without adenopathy, thyroid not enlarged, no carotid bruits, no JVD. Lungs Clear to auscultation bilaterally. Cor normal S1, normal S2, regular rhythm, no murmur,  quiet precordium. Abdomen: Soft relaxed nontender. Normoactive bowel sounds. No distention. Liver edge at costal margin Rectal: Normal perianal area. Normal rectal sphincter tone.  Nodularity in rectal ampulla consistent with chronic Crohn's disease. Stool is Hemoccult negative, no external fistula Extremities no pedal edema. Skin no lesions. Neurological alert and oriented x 3.,no asterixis Psychological normal mood and affect.  Assessment and Plan:  57 year old white female with Crohn's disease of the terminal ileum and rectum with ongoing rectovaginal fistula which has been stable. She just recovered from C. difficile colitis caused by an antibiotic given for skin infection. She is back to her baseline. We will check her bone density. As part of AGA IBD assessment, also obtain hepatitis serologies and give her Pneumovac. I will see her in one year.  Hx of significant alcohol use- normal LFT's as of 01/2013  Immunosuppression with Azathioprine, macrocytosis alcohol and Imuran effect,last CBC 01/2013 was normal   03/15/2013 Delfin Edis

## 2013-03-16 ENCOUNTER — Ambulatory Visit (INDEPENDENT_AMBULATORY_CARE_PROVIDER_SITE_OTHER)
Admission: RE | Admit: 2013-03-16 | Discharge: 2013-03-16 | Disposition: A | Discharge status: Home / Self Care | Payer: BC Managed Care – PPO | Source: Ambulatory Visit | Attending: Internal Medicine | Admitting: Internal Medicine

## 2013-03-16 DIAGNOSIS — K5010 Crohn's disease of large intestine without complications: Secondary | ICD-10-CM

## 2013-03-16 LAB — HEPATITIS B CORE ANTIBODY, IGM: Hep B C IgM: NEGATIVE

## 2013-03-16 LAB — HEPATITIS B SURFACE ANTIGEN: Hepatitis B Surface Ag: NEGATIVE

## 2013-03-16 LAB — HEPATITIS A ANTIBODY, IGM: Hep A IgM: NEGATIVE

## 2013-04-02 ENCOUNTER — Other Ambulatory Visit: Payer: Self-pay | Admitting: Internal Medicine

## 2013-04-30 ENCOUNTER — Other Ambulatory Visit: Payer: Self-pay | Admitting: Internal Medicine

## 2013-05-29 ENCOUNTER — Other Ambulatory Visit: Payer: Self-pay | Admitting: Internal Medicine

## 2013-07-15 ENCOUNTER — Other Ambulatory Visit: Payer: Self-pay | Admitting: Internal Medicine

## 2013-07-21 ENCOUNTER — Other Ambulatory Visit: Payer: Self-pay | Admitting: Internal Medicine

## 2013-07-24 ENCOUNTER — Other Ambulatory Visit: Payer: Self-pay | Admitting: Internal Medicine

## 2013-07-27 ENCOUNTER — Other Ambulatory Visit: Payer: Self-pay | Admitting: Internal Medicine

## 2013-07-31 ENCOUNTER — Other Ambulatory Visit: Payer: Self-pay | Admitting: *Deleted

## 2013-07-31 MED ORDER — LEVOTHYROXINE SODIUM 50 MCG PO TABS: 50.0000 ug | ORAL_TABLET | ORAL | Status: DC

## 2013-08-07 ENCOUNTER — Other Ambulatory Visit: Payer: Self-pay | Admitting: Internal Medicine

## 2013-08-13 ENCOUNTER — Other Ambulatory Visit: Payer: Self-pay | Admitting: Internal Medicine

## 2013-09-10 ENCOUNTER — Other Ambulatory Visit: Payer: Self-pay | Admitting: Internal Medicine

## 2013-09-11 ENCOUNTER — Other Ambulatory Visit: Payer: Self-pay | Admitting: Internal Medicine

## 2013-10-03 ENCOUNTER — Other Ambulatory Visit: Payer: Self-pay | Admitting: Internal Medicine

## 2013-10-09 ENCOUNTER — Other Ambulatory Visit: Payer: Self-pay | Admitting: Internal Medicine

## 2013-10-18 ENCOUNTER — Other Ambulatory Visit: Payer: Self-pay | Admitting: Internal Medicine

## 2013-11-09 ENCOUNTER — Other Ambulatory Visit: Payer: Self-pay | Admitting: Internal Medicine

## 2013-12-04 ENCOUNTER — Other Ambulatory Visit: Payer: Self-pay | Admitting: Internal Medicine

## 2013-12-17 ENCOUNTER — Other Ambulatory Visit: Payer: Self-pay | Admitting: Internal Medicine

## 2013-12-20 ENCOUNTER — Other Ambulatory Visit: Payer: Self-pay | Admitting: Internal Medicine

## 2013-12-20 NOTE — Telephone Encounter (Signed)
Pharmacist phoned requesting refill for tricor.  Pt has OV scheduled for 04/04/14. Refilled per protocol and spoke with patient and explained to try to get in to see PCP sooner.

## 2014-01-12 ENCOUNTER — Other Ambulatory Visit: Payer: Self-pay | Admitting: Internal Medicine

## 2014-01-18 ENCOUNTER — Other Ambulatory Visit: Payer: Self-pay | Admitting: Internal Medicine

## 2014-01-23 ENCOUNTER — Other Ambulatory Visit: Payer: Self-pay | Admitting: Internal Medicine

## 2014-01-31 ENCOUNTER — Other Ambulatory Visit: Payer: Self-pay | Admitting: Internal Medicine

## 2014-02-05 ENCOUNTER — Other Ambulatory Visit: Payer: Self-pay | Admitting: Internal Medicine

## 2014-02-13 ENCOUNTER — Other Ambulatory Visit: Payer: Self-pay | Admitting: Internal Medicine

## 2014-02-20 ENCOUNTER — Other Ambulatory Visit: Payer: Self-pay | Admitting: Internal Medicine

## 2014-03-16 ENCOUNTER — Other Ambulatory Visit: Payer: Self-pay | Admitting: Internal Medicine

## 2014-03-21 ENCOUNTER — Other Ambulatory Visit: Payer: Self-pay | Admitting: Internal Medicine

## 2014-03-28 ENCOUNTER — Other Ambulatory Visit: Payer: Self-pay | Admitting: Internal Medicine

## 2014-03-29 ENCOUNTER — Other Ambulatory Visit: Payer: Self-pay | Admitting: Internal Medicine

## 2014-03-30 ENCOUNTER — Other Ambulatory Visit: Payer: Self-pay

## 2014-03-30 MED ORDER — FENOFIBRATE 145 MG PO TABS: ORAL_TABLET | ORAL | Status: DC

## 2014-04-04 ENCOUNTER — Encounter: Payer: BC Managed Care – PPO | Admitting: Internal Medicine

## 2014-04-06 ENCOUNTER — Other Ambulatory Visit: Payer: Self-pay | Admitting: Internal Medicine

## 2014-04-11 ENCOUNTER — Other Ambulatory Visit: Payer: Self-pay | Admitting: Internal Medicine

## 2014-04-11 ENCOUNTER — Other Ambulatory Visit: Payer: Self-pay | Admitting: General Practice

## 2014-04-17 ENCOUNTER — Encounter: Payer: Self-pay | Admitting: Internal Medicine

## 2014-04-17 ENCOUNTER — Other Ambulatory Visit (INDEPENDENT_AMBULATORY_CARE_PROVIDER_SITE_OTHER): Payer: BC Managed Care – PPO

## 2014-04-17 ENCOUNTER — Ambulatory Visit (INDEPENDENT_AMBULATORY_CARE_PROVIDER_SITE_OTHER): Payer: BC Managed Care – PPO | Admitting: Internal Medicine

## 2014-04-17 ENCOUNTER — Other Ambulatory Visit: Payer: Self-pay | Admitting: Internal Medicine

## 2014-04-17 VITALS — BP 106/70 | HR 85 | Temp 98.7°F | Ht 59.0 in | Wt 108.1 lb

## 2014-04-17 DIAGNOSIS — Z124 Encounter for screening for malignant neoplasm of cervix: Secondary | ICD-10-CM

## 2014-04-17 DIAGNOSIS — Z0000 Encounter for general adult medical examination without abnormal findings: Secondary | ICD-10-CM

## 2014-04-17 DIAGNOSIS — E039 Hypothyroidism, unspecified: Secondary | ICD-10-CM

## 2014-04-17 DIAGNOSIS — Z1239 Encounter for other screening for malignant neoplasm of breast: Secondary | ICD-10-CM

## 2014-04-17 LAB — CBC WITH DIFFERENTIAL/PLATELET
BASOS PCT: 1.3 % (ref 0.0–3.0)
Basophils Absolute: 0 10*3/uL (ref 0.0–0.1)
EOS PCT: 2.1 % (ref 0.0–5.0)
Eosinophils Absolute: 0.1 10*3/uL (ref 0.0–0.7)
HCT: 37.1 % (ref 36.0–46.0)
Hemoglobin: 12.7 g/dL (ref 12.0–15.0)
LYMPHS ABS: 0.7 10*3/uL (ref 0.7–4.0)
Lymphocytes Relative: 27.7 % (ref 12.0–46.0)
MCHC: 34.1 g/dL (ref 30.0–36.0)
MCV: 103.7 fl — AB (ref 78.0–100.0)
MONO ABS: 0.4 10*3/uL (ref 0.1–1.0)
Monocytes Relative: 16.3 % — ABNORMAL HIGH (ref 3.0–12.0)
Neutro Abs: 1.3 10*3/uL — ABNORMAL LOW (ref 1.4–7.7)
Neutrophils Relative %: 52.6 % (ref 43.0–77.0)
Platelets: 246 10*3/uL (ref 150.0–400.0)
RBC: 3.58 Mil/uL — AB (ref 3.87–5.11)
RDW: 13.6 % (ref 11.5–15.5)
WBC: 2.5 10*3/uL — AB (ref 4.0–10.5)

## 2014-04-17 LAB — URINALYSIS, ROUTINE W REFLEX MICROSCOPIC
Ketones, ur: NEGATIVE
NITRITE: NEGATIVE
Specific Gravity, Urine: 1.02 (ref 1.000–1.030)
Total Protein, Urine: NEGATIVE
UROBILINOGEN UA: 0.2 (ref 0.0–1.0)
Urine Glucose: NEGATIVE
pH: 7 (ref 5.0–8.0)

## 2014-04-17 LAB — LIPID PANEL
CHOL/HDL RATIO: 3
Cholesterol: 237 mg/dL — ABNORMAL HIGH (ref 0–200)
HDL: 81.9 mg/dL (ref >39.00)
LDL Cholesterol: 101 mg/dL — ABNORMAL HIGH (ref 0–99)
NONHDL: 155.1
Triglycerides: 271 mg/dL — ABNORMAL HIGH (ref 0.0–149.0)
VLDL: 54.2 mg/dL — AB (ref 0.0–40.0)

## 2014-04-17 LAB — BASIC METABOLIC PANEL
BUN: 14 mg/dL (ref 6–23)
CHLORIDE: 104 meq/L (ref 96–112)
CO2: 30 mEq/L (ref 19–32)
Calcium: 9.9 mg/dL (ref 8.4–10.5)
Creatinine, Ser: 0.6 mg/dL (ref 0.4–1.2)
GFR: 107.11 mL/min (ref >60.00)
GLUCOSE: 82 mg/dL (ref 70–99)
Potassium: 4.8 mEq/L (ref 3.5–5.1)
Sodium: 140 mEq/L (ref 135–145)

## 2014-04-17 LAB — HEPATIC FUNCTION PANEL
ALBUMIN: 4.2 g/dL (ref 3.5–5.2)
ALT: 18 U/L (ref 0–35)
AST: 33 U/L (ref 0–37)
Alkaline Phosphatase: 35 U/L — ABNORMAL LOW (ref 39–117)
Bilirubin, Direct: 0.1 mg/dL (ref 0.0–0.3)
TOTAL PROTEIN: 6.9 g/dL (ref 6.0–8.3)
Total Bilirubin: 0.6 mg/dL (ref 0.2–1.2)

## 2014-04-17 LAB — TSH: TSH: 0.12 u[IU]/mL — AB (ref 0.35–4.50)

## 2014-04-17 MED ORDER — LEVOTHYROXINE SODIUM 50 MCG PO TABS: 50.0000 ug | ORAL_TABLET | Freq: Every day | ORAL | Status: DC

## 2014-04-17 NOTE — Addendum Note (Signed)
Addended by: Gwendolyn Grant A on: 04/17/2014 02:29 PM   Modules accepted: Orders, Medications

## 2014-04-17 NOTE — Progress Notes (Signed)
Subjective:    Patient ID: Tammy Kennedy, female    DOB: December 08, 1955, 58 y.o.   MRN: [id number]  HPI  patient is here today for annual physical. Patient feels well and has no complaints.  Also reviewed chronic medical issues and interval medical events  Past Medical History  Diagnosis Date  . HYPOTHYROIDISM   . HYPERLIPIDEMIA     intol of statin trials  . ANEMIA   . ANXIETY DISORDER   . DEPRESSION   . CROHN'S DISEASE   . OSTEOPENIA   . ALCOHOL ABUSE, HX OF   . HEPATOMEGALY, HX OF   . DRY EYE SYNDROME   . Tubular adenoma 2013  . C. difficile colitis    Family History  Problem Relation Age of Onset  . Adopted: Yes  . Alzheimer's disease Mother 60   History  Substance Use Topics  . Smoking status: Never Smoker   . Smokeless tobacco: Never Used     Comment: Works at [employer]- [employer]- part time  . Alcohol Use: 12.0 oz/week    20 Glasses of wine per week    Review of Systems  Constitutional: Positive for fatigue. Negative for unexpected weight change.  Respiratory: Negative for cough, shortness of breath and wheezing.   Cardiovascular: Negative for chest pain, palpitations and leg swelling.  Gastrointestinal: Negative for nausea, abdominal pain and diarrhea.  Neurological: Negative for dizziness, weakness, light-headedness and headaches.  Psychiatric/Behavioral: Negative for self-injury and dysphoric mood (mild chronic dysphoria w/o change). The patient is not nervous/anxious.   All other systems reviewed and are negative.      Objective:   Physical Exam  BP 106/70  Pulse 85  Temp(Src) 98.7 F (37.1 C) (Oral)  Ht 4' 11"  (1.499 m)  Wt 108 lb 2 oz (49.045 kg)  BMI 21.83 kg/m2  SpO2 97% Wt Readings from Last 3 Encounters:  04/17/14 108 lb 2 oz (49.045 kg)  03/15/13 109 lb 4 oz (49.555 kg)  02/08/13 110 lb (49.896 kg)   Constitutional: She appears well-developed and well-nourished. No distress.  HENT: Head: Normocephalic and  atraumatic. Ears: B TMs ok, no erythema or effusion; Nose: Nose normal. Mouth/Throat: Oropharynx is clear and moist. No oropharyngeal exudate.  Eyes: Conjunctivae and EOM are normal. Pupils are equal, round, and reactive to light. No scleral icterus.  Neck: Normal range of motion. Neck supple. No JVD present. No thyromegaly present.  Cardiovascular: Normal rate, regular rhythm and normal heart sounds.  No murmur heard. No BLE edema. Pulmonary/Chest: Effort normal and breath sounds normal. No respiratory distress. She has no wheezes.  Abdominal: Soft. Bowel sounds are normal. She exhibits no distension. There is no tenderness. no masses GU/breast: defer to gyn Musculoskeletal: Normal range of motion, no joint effusions. No gross deformities Neurological: She is alert and oriented to person, place, and time. No cranial nerve deficit. Coordination, balance, strength, speech and gait are normal.  Skin: Skin is warm and dry. No rash noted. No erythema.  Psychiatric: She has a normal mood and affect. Her behavior is normal. Judgment and thought content normal.    Lab Results  Component Value Date   WBC 7.7 02/08/2013   HGB 13.8 02/08/2013   HCT 39.6 02/08/2013   PLT 318.0 02/08/2013   GLUCOSE 83 02/08/2013   CHOL 276* 07/18/2012   TRIG 979.0* 07/18/2012   HDL 67.60 07/18/2012   LDLDIRECT 80.4 07/18/2012   LDLCALC 92 08/10/2007   ALT 10 02/08/2013   AST 18 02/08/2013  NA 138 02/08/2013   K 4.6 02/08/2013   CL 103 02/08/2013   CREATININE 0.7 02/08/2013   BUN 14 02/08/2013   CO2 30 02/08/2013   TSH 0.16* 07/18/2012    Dg Bone Density  03/19/2013   Osteopenia with lowest T score -2.1 at femoral neck Spine score may be falsely elevated due to scoliosis       Assessment & Plan:   CPX/v70.0 - Patient has been counseled on age-appropriate routine health concerns for screening and prevention. These are reviewed and up-to-date. Immunizations are up-to-date or declined. Labs ordered and reviewed. Refer for  gyn and mammo

## 2014-04-17 NOTE — Assessment & Plan Note (Signed)
Lab Results  Component Value Date   TSH 0.16* 07/18/2012   On replacement: 50 mcg daily except 75 mcg weekly on sun Check now and adjust as needed

## 2014-04-17 NOTE — Patient Instructions (Addendum)
It was good to see you today.  We have reviewed your prior records including labs and tests today  Health Maintenance reviewed - will refer to gynecologist and mammogram - all other recommended immunizations and age-appropriate screenings are up-to-date.  Test(s) ordered today. Your results will be released to Tammy Kennedy (or called to you) after review, usually within 72hours after test completion. If any changes need to be made, you will be notified at that same time.  Medications reviewed and updated, no changes recommended at this time. Refill on medication(s) as discussed today.  Please schedule followup in 12 months for annual exam and labs, call sooner if problems.  Health Maintenance, Female A healthy lifestyle and preventative care can promote health and wellness.  Maintain regular health, dental, and eye exams.  Eat a healthy diet. Foods like vegetables, fruits, whole grains, low-fat dairy products, and lean protein foods contain the nutrients you need without too many calories. Decrease your intake of foods high in solid fats, added sugars, and salt. Get information about a proper diet from your caregiver, if necessary.  Regular physical exercise is one of the most important things you can do for your health. Most adults should get at least 150 minutes of moderate-intensity exercise (any activity that increases your heart rate and causes you to sweat) each week. In addition, most adults need muscle-strengthening exercises on 2 or more days a week.   Maintain a healthy weight. The body mass index (BMI) is a screening tool to identify possible weight problems. It provides an estimate of body fat based on height and weight. Your caregiver can help determine your BMI, and can help you achieve or maintain a healthy weight. For adults 20 years and older:  A BMI below 18.5 is considered underweight.  A BMI of 18.5 to 24.9 is normal.  A BMI of 25 to 29.9 is considered overweight.  A BMI  of 30 and above is considered obese.  Maintain normal blood lipids and cholesterol by exercising and minimizing your intake of saturated fat. Eat a balanced diet with plenty of fruits and vegetables. Blood tests for lipids and cholesterol should begin at age 52 and be repeated every 5 years. If your lipid or cholesterol levels are high, you are over 50, or you are a high risk for heart disease, you may need your cholesterol levels checked more frequently.Ongoing high lipid and cholesterol levels should be treated with medicines if diet and exercise are not effective.  If you smoke, find out from your caregiver how to quit. If you do not use tobacco, do not start.  Lung cancer screening is recommended for adults aged 65-80 years who are at high risk for developing lung cancer because of a history of smoking. Yearly low-dose computed tomography (CT) is recommended for people who have at least a 30-pack-year history of smoking and are a current smoker or have quit within the past 15 years. A pack year of smoking is smoking an average of 1 pack of cigarettes a day for 1 year (for example: 1 pack a day for 30 years or 2 packs a day for 15 years). Yearly screening should continue until the smoker has stopped smoking for at least 15 years. Yearly screening should also be stopped for people who develop a health problem that would prevent them from having lung cancer treatment.  If you are pregnant, do not drink alcohol. If you are breastfeeding, be very cautious about drinking alcohol. If you are not pregnant and  choose to drink alcohol, do not exceed 1 drink per day. One drink is considered to be 12 ounces (355 mL) of beer, 5 ounces (148 mL) of wine, or 1.5 ounces (44 mL) of liquor.  Avoid use of street drugs. Do not share needles with anyone. Ask for help if you need support or instructions about stopping the use of drugs.  High blood pressure causes heart disease and increases the risk of stroke. Blood  pressure should be checked at least every 1 to 2 years. Ongoing high blood pressure should be treated with medicines, if weight loss and exercise are not effective.  If you are 69 to 58 years old, ask your caregiver if you should take aspirin to prevent strokes.  Diabetes screening involves taking a blood sample to check your fasting blood sugar level. This should be done once every 3 years, after age 16, if you are within normal weight and without risk factors for diabetes. Testing should be considered at a younger age or be carried out more frequently if you are overweight and have at least 1 risk factor for diabetes.  Breast cancer screening is essential preventative care for women. You should practice "breast self-awareness." This means understanding the normal appearance and feel of your breasts and may include breast self-examination. Any changes detected, no matter how small, should be reported to a caregiver. Women in their 40s and 30s should have a clinical breast exam (CBE) by a caregiver as part of a regular health exam every 1 to 3 years. After age 45, women should have a CBE every year. Starting at age 55, women should consider having a mammogram (breast X-ray) every year. Women who have a family history of breast cancer should talk to their caregiver about genetic screening. Women at a high risk of breast cancer should talk to their caregiver about having an MRI and a mammogram every year.  Breast cancer gene (BRCA)-related cancer risk assessment is recommended for women who have family members with BRCA-related cancers. BRCA-related cancers include breast, ovarian, tubal, and peritoneal cancers. Having family members with these cancers may be associated with an increased risk for harmful changes (mutations) in the breast cancer genes BRCA1 and BRCA2. Results of the assessment will determine the need for genetic counseling and BRCA1 and BRCA2 testing.  The Pap test is a screening test for  cervical cancer. Women should have a Pap test starting at age 39. Between ages 83 and 4, Pap tests should be repeated every 2 years. Beginning at age 4, you should have a Pap test every 3 years as long as the past 3 Pap tests have been normal. If you had a hysterectomy for a problem that was not cancer or a condition that could lead to cancer, then you no longer need Pap tests. If you are between ages 29 and 52, and you have had normal Pap tests going back 10 years, you no longer need Pap tests. If you have had past treatment for cervical cancer or a condition that could lead to cancer, you need Pap tests and screening for cancer for at least 20 years after your treatment. If Pap tests have been discontinued, risk factors (such as a new sexual partner) need to be reassessed to determine if screening should be resumed. Some women have medical problems that increase the chance of getting cervical cancer. In these cases, your caregiver may recommend more frequent screening and Pap tests.  The human papillomavirus (HPV) test is an additional test  that may be used for cervical cancer screening. The HPV test looks for the virus that can cause the cell changes on the cervix. The cells collected during the Pap test can be tested for HPV. The HPV test could be used to screen women aged 26 years and older, and should be used in women of any age who have unclear Pap test results. After the age of 44, women should have HPV testing at the same frequency as a Pap test.  Colorectal cancer can be detected and often prevented. Most routine colorectal cancer screening begins at the age of 21 and continues through age 14. However, your caregiver may recommend screening at an earlier age if you have risk factors for colon cancer. On a yearly basis, your caregiver may provide home test kits to check for hidden blood in the stool. Use of a small camera at the end of a tube, to directly examine the colon (sigmoidoscopy or  colonoscopy), can detect the earliest forms of colorectal cancer. Talk to your caregiver about this at age 27, when routine screening begins. Direct examination of the colon should be repeated every 5 to 10 years through age 31, unless early forms of pre-cancerous polyps or small growths are found.  Hepatitis C blood testing is recommended for all people born from [birth year] through 1965 and any individual with known risks for hepatitis C.  Practice safe sex. Use condoms and avoid high-risk sexual practices to reduce the spread of sexually transmitted infections (STIs). Sexually active women aged 54 and younger should be checked for Chlamydia, which is a common sexually transmitted infection. Older women with new or multiple partners should also be tested for Chlamydia. Testing for other STIs is recommended if you are sexually active and at increased risk.  Osteoporosis is a disease in which the bones lose minerals and strength with aging. This can result in serious bone fractures. The risk of osteoporosis can be identified using a bone density scan. Women ages 52 and over and women at risk for fractures or osteoporosis should discuss screening with their caregivers. Ask your caregiver whether you should be taking a calcium supplement or vitamin D to reduce the rate of osteoporosis.  Menopause can be associated with physical symptoms and risks. Hormone replacement therapy is available to decrease symptoms and risks. You should talk to your caregiver about whether hormone replacement therapy is right for you.  Use sunscreen. Apply sunscreen liberally and repeatedly throughout the day. You should seek shade when your shadow is shorter than you. Protect yourself by wearing long sleeves, pants, a wide-brimmed hat, and sunglasses year round, whenever you are outdoors.  Notify your caregiver of new moles or changes in moles, especially if there is a change in shape or color. Also notify your caregiver if a mole is  larger than the size of a pencil eraser.  Stay current with your immunizations. Document Released: 04/13/2011 Document Revised: 01/23/2013 Document Reviewed: 08/30/2013 [facility] Patient Information 2015 [place], [place]. This information is not intended to replace advice given to you by your health care provider. Make sure you discuss any questions you have with your health care provider. Health Maintenance, Female A healthy lifestyle and preventative care can promote health and wellness.  Maintain regular health, dental, and eye exams.  Eat a healthy diet. Foods like vegetables, fruits, whole grains, low-fat dairy products, and lean protein foods contain the nutrients you need without too many calories. Decrease your intake of foods high in solid fats, added sugars,  and salt. Get information about a proper diet from your caregiver, if necessary.  Regular physical exercise is one of the most important things you can do for your health. Most adults should get at least 150 minutes of moderate-intensity exercise (any activity that increases your heart rate and causes you to sweat) each week. In addition, most adults need muscle-strengthening exercises on 2 or more days a week.   Maintain a healthy weight. The body mass index (BMI) is a screening tool to identify possible weight problems. It provides an estimate of body fat based on height and weight. Your caregiver can help determine your BMI, and can help you achieve or maintain a healthy weight. For adults 20 years and older:  A BMI below 18.5 is considered underweight.  A BMI of 18.5 to 24.9 is normal.  A BMI of 25 to 29.9 is considered overweight.  A BMI of 30 and above is considered obese.  Maintain normal blood lipids and cholesterol by exercising and minimizing your intake of saturated fat. Eat a balanced diet with plenty of fruits and vegetables. Blood tests for lipids and cholesterol should begin at age 30 and be repeated every 5 years.  If your lipid or cholesterol levels are high, you are over 50, or you are a high risk for heart disease, you may need your cholesterol levels checked more frequently.Ongoing high lipid and cholesterol levels should be treated with medicines if diet and exercise are not effective.  If you smoke, find out from your caregiver how to quit. If you do not use tobacco, do not start.  Lung cancer screening is recommended for adults aged 7-80 years who are at high risk for developing lung cancer because of a history of smoking. Yearly low-dose computed tomography (CT) is recommended for people who have at least a 30-pack-year history of smoking and are a current smoker or have quit within the past 15 years. A pack year of smoking is smoking an average of 1 pack of cigarettes a day for 1 year (for example: 1 pack a day for 30 years or 2 packs a day for 15 years). Yearly screening should continue until the smoker has stopped smoking for at least 15 years. Yearly screening should also be stopped for people who develop a health problem that would prevent them from having lung cancer treatment.  If you are pregnant, do not drink alcohol. If you are breastfeeding, be very cautious about drinking alcohol. If you are not pregnant and choose to drink alcohol, do not exceed 1 drink per day. One drink is considered to be 12 ounces (355 mL) of beer, 5 ounces (148 mL) of wine, or 1.5 ounces (44 mL) of liquor.  Avoid use of street drugs. Do not share needles with anyone. Ask for help if you need support or instructions about stopping the use of drugs.  High blood pressure causes heart disease and increases the risk of stroke. Blood pressure should be checked at least every 1 to 2 years. Ongoing high blood pressure should be treated with medicines, if weight loss and exercise are not effective.  If you are 67 to 58 years old, ask your caregiver if you should take aspirin to prevent strokes.  Diabetes screening involves  taking a blood sample to check your fasting blood sugar level. This should be done once every 3 years, after age 66, if you are within normal weight and without risk factors for diabetes. Testing should be considered at a younger age  or be carried out more frequently if you are overweight and have at least 1 risk factor for diabetes.  Breast cancer screening is essential preventative care for women. You should practice "breast self-awareness." This means understanding the normal appearance and feel of your breasts and may include breast self-examination. Any changes detected, no matter how small, should be reported to a caregiver. Women in their 82s and 30s should have a clinical breast exam (CBE) by a caregiver as part of a regular health exam every 1 to 3 years. After age 38, women should have a CBE every year. Starting at age 82, women should consider having a mammogram (breast X-ray) every year. Women who have a family history of breast cancer should talk to their caregiver about genetic screening. Women at a high risk of breast cancer should talk to their caregiver about having an MRI and a mammogram every year.  Breast cancer gene (BRCA)-related cancer risk assessment is recommended for women who have family members with BRCA-related cancers. BRCA-related cancers include breast, ovarian, tubal, and peritoneal cancers. Having family members with these cancers may be associated with an increased risk for harmful changes (mutations) in the breast cancer genes BRCA1 and BRCA2. Results of the assessment will determine the need for genetic counseling and BRCA1 and BRCA2 testing.  The Pap test is a screening test for cervical cancer. Women should have a Pap test starting at age 29. Between ages 45 and 22, Pap tests should be repeated every 2 years. Beginning at age 29, you should have a Pap test every 3 years as long as the past 3 Pap tests have been normal. If you had a hysterectomy for a problem that was not  cancer or a condition that could lead to cancer, then you no longer need Pap tests. If you are between ages 24 and 4, and you have had normal Pap tests going back 10 years, you no longer need Pap tests. If you have had past treatment for cervical cancer or a condition that could lead to cancer, you need Pap tests and screening for cancer for at least 20 years after your treatment. If Pap tests have been discontinued, risk factors (such as a new sexual partner) need to be reassessed to determine if screening should be resumed. Some women have medical problems that increase the chance of getting cervical cancer. In these cases, your caregiver may recommend more frequent screening and Pap tests.  The human papillomavirus (HPV) test is an additional test that may be used for cervical cancer screening. The HPV test looks for the virus that can cause the cell changes on the cervix. The cells collected during the Pap test can be tested for HPV. The HPV test could be used to screen women aged 30 years and older, and should be used in women of any age who have unclear Pap test results. After the age of 13, women should have HPV testing at the same frequency as a Pap test.  Colorectal cancer can be detected and often prevented. Most routine colorectal cancer screening begins at the age of 35 and continues through age 73. However, your caregiver may recommend screening at an earlier age if you have risk factors for colon cancer. On a yearly basis, your caregiver may provide home test kits to check for hidden blood in the stool. Use of a small camera at the end of a tube, to directly examine the colon (sigmoidoscopy or colonoscopy), can detect the earliest forms of colorectal cancer.  Talk to your caregiver about this at age 24, when routine screening begins. Direct examination of the colon should be repeated every 5 to 10 years through age 27, unless early forms of pre-cancerous polyps or small growths are  found.  Hepatitis C blood testing is recommended for all people born from [birth year] through 1965 and any individual with known risks for hepatitis C.  Practice safe sex. Use condoms and avoid high-risk sexual practices to reduce the spread of sexually transmitted infections (STIs). Sexually active women aged 55 and younger should be checked for Chlamydia, which is a common sexually transmitted infection. Older women with new or multiple partners should also be tested for Chlamydia. Testing for other STIs is recommended if you are sexually active and at increased risk.  Osteoporosis is a disease in which the bones lose minerals and strength with aging. This can result in serious bone fractures. The risk of osteoporosis can be identified using a bone density scan. Women ages 47 and over and women at risk for fractures or osteoporosis should discuss screening with their caregivers. Ask your caregiver whether you should be taking a calcium supplement or vitamin D to reduce the rate of osteoporosis.  Menopause can be associated with physical symptoms and risks. Hormone replacement therapy is available to decrease symptoms and risks. You should talk to your caregiver about whether hormone replacement therapy is right for you.  Use sunscreen. Apply sunscreen liberally and repeatedly throughout the day. You should seek shade when your shadow is shorter than you. Protect yourself by wearing long sleeves, pants, a wide-brimmed hat, and sunglasses year round, whenever you are outdoors.  Notify your caregiver of new moles or changes in moles, especially if there is a change in shape or color. Also notify your caregiver if a mole is larger than the size of a pencil eraser.  Stay current with your immunizations. Document Released: 04/13/2011 Document Revised: 01/23/2013 Document Reviewed: 08/30/2013 [facility] Patient Information 2015 [place], [place]. This information is not intended to replace advice given to you by  your health care provider. Make sure you discuss any questions you have with your health care provider.

## 2014-04-17 NOTE — Progress Notes (Signed)
Pre visit review using our clinic review tool, if applicable. No additional management support is needed unless otherwise documented below in the visit note. 

## 2014-04-23 ENCOUNTER — Telehealth: Payer: Self-pay | Admitting: Gynecology

## 2014-04-23 NOTE — Telephone Encounter (Signed)
LMTCB to schedule a new patient MD referral.

## 2014-04-27 ENCOUNTER — Other Ambulatory Visit: Payer: Self-pay | Admitting: Internal Medicine

## 2014-04-27 NOTE — Telephone Encounter (Signed)
Scheduled

## 2014-04-27 NOTE — Telephone Encounter (Signed)
[email or web address]

## 2014-04-30 ENCOUNTER — Ambulatory Visit
Admission: RE | Admit: 2014-04-30 | Discharge: 2014-04-30 | Disposition: A | Discharge status: Home / Self Care | Payer: BC Managed Care – PPO | Source: Ambulatory Visit | Attending: Internal Medicine | Admitting: Internal Medicine

## 2014-04-30 DIAGNOSIS — Z1239 Encounter for other screening for malignant neoplasm of breast: Secondary | ICD-10-CM

## 2014-05-04 ENCOUNTER — Other Ambulatory Visit: Payer: Self-pay | Admitting: Internal Medicine

## 2014-05-29 ENCOUNTER — Encounter: Payer: Self-pay | Admitting: Gynecology

## 2014-05-29 ENCOUNTER — Ambulatory Visit (INDEPENDENT_AMBULATORY_CARE_PROVIDER_SITE_OTHER): Payer: BC Managed Care – PPO | Admitting: Gynecology

## 2014-05-29 VITALS — BP 102/64 | HR 68 | Resp 12 | Ht 60.0 in | Wt 108.0 lb

## 2014-05-29 DIAGNOSIS — A0472 Enterocolitis due to Clostridium difficile, not specified as recurrent: Secondary | ICD-10-CM | POA: Insufficient documentation

## 2014-05-29 DIAGNOSIS — K5090 Crohn's disease, unspecified, without complications: Secondary | ICD-10-CM

## 2014-05-29 DIAGNOSIS — N823 Fistula of vagina to large intestine: Secondary | ICD-10-CM

## 2014-05-29 DIAGNOSIS — N824 Other female intestinal-genital tract fistulae: Secondary | ICD-10-CM

## 2014-05-29 DIAGNOSIS — Z01419 Encounter for gynecological examination (general) (routine) without abnormal findings: Secondary | ICD-10-CM

## 2014-05-29 DIAGNOSIS — Z124 Encounter for screening for malignant neoplasm of cervix: Secondary | ICD-10-CM

## 2014-05-29 NOTE — Progress Notes (Signed)
58 y.o. Divorced Caucasian female   [id number] here for annual exam. Pt reports menses are absent due to Menopause. She does report hot flashes, does have night sweats, does not have vaginal dryness.  She is not using lubricants.  She does not report post-menopasual bleeding. Pt was diagnosed with a rectovaginal fistula in 2012, pt reports vaginal discharge with stool like appearance when she has diarrhea but not with formed stool.  No discharge in absence of diarrhea.  Pt had some diarrhea this am.  No discharge with coitus.   Patient's last menstrual period was 10/12/2005.          Sexually active: Yes.    The current method of family planning is post menopausal status.    Exercising: No.  The patient does not participate in regular exercise at present. Last pap: 2-3 Years ago- not sure Abnormal PAP: yes Mammogram:  04/30/14 Bi-Rads 1 BSE: no Colonoscopy: 2 years ago-polyp f/u nin 5 years  DEXA: 03/20/13  Alcohol:  4 glasses of wine every night Tobacco: no  Labs: Gwendolyn Grant, MD  Health Maintenance  Topic Date Due  . Influenza Vaccine  05/12/2014  . Mammogram  04/30/2016  . Pap Smear  04/17/2017  . Colonoscopy  05/06/2017  . Tetanus/tdap  07/24/2017    Family History  Problem Relation Age of Onset  . Adopted: Yes  . Alzheimer's disease Mother 29    Patient Active Problem List   Diagnosis Date Noted  . Abnormal LFTs   . KNEE PAIN, LEFT 02/14/2010  . DRY EYE SYNDROME 08/28/2009  . CROHN'S DISEASE 05/27/2009  . HEPATOMEGALY, HX OF 08/31/2008  . HYPOTHYROIDISM 07/30/2008  . HYPERLIPIDEMIA 07/30/2008  . ANEMIA 07/30/2008  . ANXIETY DISORDER 07/30/2008  . DEPRESSION 07/30/2008  . HYPERTENSION 07/30/2008  . RENAL CYST 07/30/2008  . DEGENERATIVE DISC DISEASE 07/30/2008  . OSTEOPENIA 07/30/2008  . ALCOHOL ABUSE, HX OF 07/30/2008    Past Medical History  Diagnosis Date  . HYPOTHYROIDISM   . HYPERLIPIDEMIA     intol of statin trials  . ANEMIA   . ANXIETY DISORDER   .  DEPRESSION   . CROHN'S DISEASE   . OSTEOPENIA   . ALCOHOL ABUSE, HX OF   . HEPATOMEGALY, HX OF   . DRY EYE SYNDROME   . Tubular adenoma 2013  . C. difficile colitis     Past Surgical History  Procedure Laterality Date  . Anal fissure repair    . Hemorrhoid surgery    . Theraputic abortion    . Mohs surgery  2008    Nose BCC  . Left thumb surgery  1989    Allergies: Review of patient's allergies indicates no known allergies.  Current Outpatient Prescriptions  Medication Sig Dispense Refill  . AZASAN 100 MG tablet TAKE 1 TABLET BY MOUTH EVERY DAY  30 tablet  1  . fenofibrate (TRICOR) 145 MG tablet Take 1 tablet (145 mg total) by mouth daily.  30 tablet  11  . folic acid (FOLVITE) 1 MG tablet Take 1 tablet (1 mg total) by mouth daily.  90 tablet  3  . levothyroxine (SYNTHROID, LEVOTHROID) 50 MCG tablet Take 1 tablet (50 mcg total) by mouth daily before breakfast.  90 tablet  3  . LORazepam (ATIVAN) 0.5 MG tablet Take 0.5 mg by mouth daily as needed.        . Potassium Gluconate 550 MG TABS Take 1 tablet by mouth daily.      . Sennosides (EX-LAX PO)  Take by mouth as directed.      . valACYclovir (VALTREX) 500 MG tablet Take 1 tablet by mouth daily.  30 tablet  3  . venlafaxine XR (EFFEXOR-XR) 150 MG 24 hr capsule TAKE 1 CAPSULE BY MOUTH EVERY MORNING  90 capsule  3  . vitamin B-12 (CYANOCOBALAMIN) 500 MCG tablet Take 500 mcg by mouth daily.         No current facility-administered medications for this visit.    ROS: Pertinent items are noted in HPI.  Exam:    BP 102/64  Pulse 68  Resp 12  Ht 5' (1.524 m)  Wt 108 lb (48.988 kg)  BMI 21.09 kg/m2  LMP 10/12/2005 Weight change: @WEIGHTCHANGE @ Last 3 height recordings:  Ht Readings from Last 3 Encounters:  05/29/14 5' (1.524 m)  04/17/14 4' 11"  (1.499 m)  03/15/13 4' 11"  (1.499 m)   General appearance: alert, cooperative and appears stated age Head: Normocephalic, without obvious abnormality, atraumatic Neck: no  adenopathy, no carotid bruit, no JVD, supple, symmetrical, trachea midline and thyroid not enlarged, symmetric, no tenderness/mass/nodules Lungs: clear to auscultation bilaterally Breasts: normal appearance, no masses or tenderness Heart: regular rate and rhythm, S1, S2 normal, no murmur, click, rub or gallop Abdomen: soft, non-tender; bowel sounds normal; no masses,  no organomegaly Extremities: extremities normal, atraumatic, no cyanosis or edema Skin: Skin color, texture, turgor normal. No rashes or lesions Lymph nodes: Cervical, supraclavicular, and axillary nodes normal. no inguinal nodes palpated Neurologic: Grossly normal   Pelvic: External genitalia:  no lesions              Urethra: normal appearing urethra with no masses, tenderness or lesions              Bartholins and Skenes: normal                 Vagina: normal appearing vagina with normal color and discharge, no lesions, small amount of stool noted at introitus only, none in vagina, no malodor              Cervix: normal appearance and friable              Pap taken: Yes.          Bimanual Exam:  Uterus:  uterus is normal size, shape, consistency and nontender                                      Adnexa:    normal adnexa in size, nontender and no masses                                      Rectovaginal: Confirms                                      Anus:  normal sphincter tone, no lesions           1. Routine gynecological examination counseled on breast self exam, mammography screening return annually or prn Discussed PAP guideline changes, importance of weight bearing exercises, calcium, vit D and balanced diet.   2. Rectovaginal fistula Due to Crohn's disease, reviewed risks of other gyn fistulas such as uterine.  Fistula appears to be close to hymenal ring based  on finding of stool, pt is not interested in having repaired at this time.   3. Screening for cervical cancer Guidelines reviewed - Pap Test with  HP (IPS)  An After Visit Summary was printed and given to the patient.

## 2014-05-31 LAB — IPS PAP TEST WITH HPV

## 2014-06-08 ENCOUNTER — Other Ambulatory Visit: Payer: Self-pay | Admitting: Internal Medicine

## 2014-07-05 ENCOUNTER — Other Ambulatory Visit: Payer: Self-pay | Admitting: Internal Medicine

## 2014-08-10 ENCOUNTER — Other Ambulatory Visit: Payer: Self-pay | Admitting: Internal Medicine

## 2014-08-13 ENCOUNTER — Encounter: Payer: Self-pay | Admitting: Gynecology

## 2014-10-16 ENCOUNTER — Other Ambulatory Visit: Payer: Self-pay | Admitting: Internal Medicine

## 2014-10-30 ENCOUNTER — Other Ambulatory Visit: Payer: Self-pay | Admitting: Internal Medicine

## 2014-12-05 ENCOUNTER — Other Ambulatory Visit: Payer: Self-pay | Admitting: Internal Medicine

## 2015-01-13 ENCOUNTER — Other Ambulatory Visit: Payer: Self-pay | Admitting: Internal Medicine

## 2015-02-10 ENCOUNTER — Other Ambulatory Visit: Payer: Self-pay | Admitting: Internal Medicine

## 2015-02-21 ENCOUNTER — Encounter: Payer: Self-pay | Admitting: Internal Medicine

## 2015-03-14 ENCOUNTER — Other Ambulatory Visit: Payer: Self-pay | Admitting: Internal Medicine

## 2015-04-05 ENCOUNTER — Other Ambulatory Visit: Payer: Self-pay | Admitting: Internal Medicine

## 2015-04-12 ENCOUNTER — Other Ambulatory Visit: Payer: Self-pay | Admitting: Internal Medicine

## 2015-04-16 ENCOUNTER — Other Ambulatory Visit: Payer: Self-pay | Admitting: Internal Medicine

## 2015-04-23 ENCOUNTER — Other Ambulatory Visit: Payer: Self-pay | Admitting: Internal Medicine

## 2015-04-23 NOTE — Telephone Encounter (Signed)
Pt had an OV today but not on schedule as of now. No other appt with PCP.

## 2015-04-24 ENCOUNTER — Encounter: Payer: Self-pay | Admitting: Internal Medicine

## 2015-04-24 ENCOUNTER — Other Ambulatory Visit (INDEPENDENT_AMBULATORY_CARE_PROVIDER_SITE_OTHER): Payer: BLUE CROSS/BLUE SHIELD

## 2015-04-24 ENCOUNTER — Ambulatory Visit (INDEPENDENT_AMBULATORY_CARE_PROVIDER_SITE_OTHER): Payer: BLUE CROSS/BLUE SHIELD | Admitting: Internal Medicine

## 2015-04-24 VITALS — BP 110/60 | HR 78 | Temp 97.8°F | Ht 60.0 in | Wt 109.0 lb

## 2015-04-24 DIAGNOSIS — R7989 Other specified abnormal findings of blood chemistry: Secondary | ICD-10-CM | POA: Diagnosis not present

## 2015-04-24 DIAGNOSIS — Z23 Encounter for immunization: Secondary | ICD-10-CM | POA: Diagnosis not present

## 2015-04-24 DIAGNOSIS — M8580 Other specified disorders of bone density and structure, unspecified site: Secondary | ICD-10-CM

## 2015-04-24 DIAGNOSIS — E039 Hypothyroidism, unspecified: Secondary | ICD-10-CM

## 2015-04-24 DIAGNOSIS — Z0000 Encounter for general adult medical examination without abnormal findings: Secondary | ICD-10-CM | POA: Diagnosis not present

## 2015-04-24 DIAGNOSIS — E785 Hyperlipidemia, unspecified: Secondary | ICD-10-CM

## 2015-04-24 DIAGNOSIS — K5090 Crohn's disease, unspecified, without complications: Secondary | ICD-10-CM

## 2015-04-24 LAB — BASIC METABOLIC PANEL
BUN: 15 mg/dL (ref 6–23)
CO2: 30 meq/L (ref 19–32)
Calcium: 9.5 mg/dL (ref 8.4–10.5)
Chloride: 104 mEq/L (ref 96–112)
Creatinine, Ser: 0.82 mg/dL (ref 0.40–1.20)
GFR: 75.86 mL/min (ref >60.00)
Glucose, Bld: 75 mg/dL (ref 70–99)
Potassium: 3.8 mEq/L (ref 3.5–5.1)
SODIUM: 141 meq/L (ref 135–145)

## 2015-04-24 LAB — URINALYSIS, ROUTINE W REFLEX MICROSCOPIC
Bilirubin Urine: NEGATIVE
Ketones, ur: NEGATIVE
Nitrite: NEGATIVE
PH: 6.5 (ref 5.0–8.0)
Specific Gravity, Urine: 1.02 (ref 1.000–1.030)
Total Protein, Urine: NEGATIVE
Urine Glucose: NEGATIVE
Urobilinogen, UA: 0.2 (ref 0.0–1.0)

## 2015-04-24 LAB — HEPATIC FUNCTION PANEL
ALBUMIN: 4.1 g/dL (ref 3.5–5.2)
ALT: 17 U/L (ref 0–35)
AST: 35 U/L (ref 0–37)
Alkaline Phosphatase: 43 U/L (ref 39–117)
Bilirubin, Direct: 0.1 mg/dL (ref 0.0–0.3)
Total Bilirubin: 0.4 mg/dL (ref 0.2–1.2)
Total Protein: 7.1 g/dL (ref 6.0–8.3)

## 2015-04-24 LAB — TSH: TSH: 16.69 u[IU]/mL — ABNORMAL HIGH (ref 0.35–4.50)

## 2015-04-24 LAB — CBC WITH DIFFERENTIAL/PLATELET
BASOS ABS: 0.1 10*3/uL (ref 0.0–0.1)
Basophils Relative: 1.6 % (ref 0.0–3.0)
EOS PCT: 3.1 % (ref 0.0–5.0)
Eosinophils Absolute: 0.1 10*3/uL (ref 0.0–0.7)
HCT: 40.2 % (ref 36.0–46.0)
Hemoglobin: 13.6 g/dL (ref 12.0–15.0)
LYMPHS ABS: 1.2 10*3/uL (ref 0.7–4.0)
LYMPHS PCT: 31.9 % (ref 12.0–46.0)
MCHC: 33.8 g/dL (ref 30.0–36.0)
MCV: 100.8 fl — ABNORMAL HIGH (ref 78.0–100.0)
MONO ABS: 0.5 10*3/uL (ref 0.1–1.0)
Monocytes Relative: 12.5 % — ABNORMAL HIGH (ref 3.0–12.0)
NEUTROS ABS: 2 10*3/uL (ref 1.4–7.7)
Neutrophils Relative %: 50.9 % (ref 43.0–77.0)
Platelets: 291 10*3/uL (ref 150.0–400.0)
RBC: 3.99 Mil/uL (ref 3.87–5.11)
RDW: 12.6 % (ref 11.5–15.5)
WBC: 3.8 10*3/uL — ABNORMAL LOW (ref 4.0–10.5)

## 2015-04-24 LAB — LIPID PANEL
Cholesterol: 220 mg/dL — ABNORMAL HIGH (ref 0–200)
HDL: 70.3 mg/dL (ref >39.00)
Total CHOL/HDL Ratio: 3

## 2015-04-24 LAB — LDL CHOLESTEROL, DIRECT: Direct LDL: 98 mg/dL

## 2015-04-24 MED ORDER — VENLAFAXINE HCL ER 150 MG PO CP24: 150.0000 mg | ORAL_CAPSULE | Freq: Every day | ORAL | Status: DC

## 2015-04-24 MED ORDER — FENOFIBRATE 145 MG PO TABS: 145.0000 mg | ORAL_TABLET | Freq: Every day | ORAL | Status: DC

## 2015-04-24 NOTE — Assessment & Plan Note (Signed)
Due for DEXA  - will schedule same Previously on boniva - stopped after 5 years therapy in 2013 ongoing Ca+D

## 2015-04-24 NOTE — Assessment & Plan Note (Signed)
Complicated hx, currently stable, off maintained prednisone since 2010- self DC's Azasan 10/2014 due to no refills from GI - and remians stable without flare [place] summer 2013 reviewed  Due for GI follow up - will help arrange same

## 2015-04-24 NOTE — Patient Instructions (Addendum)
It was good to see you today.  We have reviewed your prior records including labs and tests today  Health Maintenance reviewed - Prevnar immunization updated today -all other recommended immunizations and age-appropriate screenings are up-to-date.  Test(s) ordered today. Your results will be released to [place] (or called to you) after review, usually within 72hours after test completion. If any changes need to be made, you will be notified at that same time.  Medications reviewed and updated, no changes recommended at this time.  Schedule your follow-up bone density and make referral for follow-up colonoscopy as discussed. My office will call with these appointments  Please schedule followup in 12 months for annual exam and labs, call sooner if problems.  Health Maintenance Adopting a healthy lifestyle and getting preventive care can go a long way to promote health and wellness. Talk with your health care provider about what schedule of regular examinations is right for you. This is a good chance for you to check in with your provider about disease prevention and staying healthy. In between checkups, there are plenty of things you can do on your own. Experts have done a lot of research about which lifestyle changes and preventive measures are most likely to keep you healthy. Ask your health care provider for more information. WEIGHT AND DIET  Eat a healthy diet  Be sure to include plenty of vegetables, fruits, low-fat dairy products, and lean protein.  Do not eat a lot of foods high in solid fats, added sugars, or salt.  Get regular exercise. This is one of the most important things you can do for your health.  Most adults should exercise for at least 150 minutes each week. The exercise should increase your heart rate and make you sweat (moderate-intensity exercise).  Most adults should also do strengthening exercises at least twice a week. This is in addition to the moderate-intensity  exercise.  Maintain a healthy weight  Body mass index (BMI) is a measurement that can be used to identify possible weight problems. It estimates body fat based on height and weight. Your health care provider can help determine your BMI and help you achieve or maintain a healthy weight.  For females 65 years of age and older:   A BMI below 18.5 is considered underweight.  A BMI of 18.5 to 24.9 is normal.  A BMI of 25 to 29.9 is considered overweight.  A BMI of 30 and above is considered obese.  Watch levels of cholesterol and blood lipids  You should start having your blood tested for lipids and cholesterol at 59 years of age, then have this test every 5 years.  You may need to have your cholesterol levels checked more often if:  Your lipid or cholesterol levels are high.  You are older than 59 years of age.  You are at high risk for heart disease.  CANCER SCREENING   Lung Cancer  Lung cancer screening is recommended for adults 72-52 years old who are at high risk for lung cancer because of a history of smoking.  A yearly low-dose CT scan of the lungs is recommended for people who:  Currently smoke.  Have quit within the past 15 years.  Have at least a 30-pack-year history of smoking. A pack year is smoking an average of one pack of cigarettes a day for 1 year.  Yearly screening should continue until it has been 15 years since you quit.  Yearly screening should stop if you develop a health  problem that would prevent you from having lung cancer treatment.  Breast Cancer  Practice breast self-awareness. This means understanding how your breasts normally appear and feel.  It also means doing regular breast self-exams. Let your health care provider know about any changes, no matter how small.  If you are in your 20s or 30s, you should have a clinical breast exam (CBE) by a health care provider every 1-3 years as part of a regular health exam.  If you are 31 or  older, have a CBE every year. Also consider having a breast X-ray (mammogram) every year.  If you have a family history of breast cancer, talk to your health care provider about genetic screening.  If you are at high risk for breast cancer, talk to your health care provider about having an MRI and a mammogram every year.  Breast cancer gene (BRCA) assessment is recommended for women who have family members with BRCA-related cancers. BRCA-related cancers include:  Breast.  Ovarian.  Tubal.  Peritoneal cancers.  Results of the assessment will determine the need for genetic counseling and BRCA1 and BRCA2 testing. Cervical Cancer Routine pelvic examinations to screen for cervical cancer are no longer recommended for nonpregnant women who are considered low risk for cancer of the pelvic organs (ovaries, uterus, and vagina) and who do not have symptoms. A pelvic examination may be necessary if you have symptoms including those associated with pelvic infections. Ask your health care provider if a screening pelvic exam is right for you.   The Pap test is the screening test for cervical cancer for women who are considered at risk.  If you had a hysterectomy for a problem that was not cancer or a condition that could lead to cancer, then you no longer need Pap tests.  If you are older than 65 years, and you have had normal Pap tests for the past 10 years, you no longer need to have Pap tests.  If you have had past treatment for cervical cancer or a condition that could lead to cancer, you need Pap tests and screening for cancer for at least 20 years after your treatment.  If you no longer get a Pap test, assess your risk factors if they change (such as having a new sexual partner). This can affect whether you should start being screened again.  Some women have medical problems that increase their chance of getting cervical cancer. If this is the case for you, your health care provider may  recommend more frequent screening and Pap tests.  The human papillomavirus (HPV) test is another test that may be used for cervical cancer screening. The HPV test looks for the virus that can cause cell changes in the cervix. The cells collected during the Pap test can be tested for HPV.  The HPV test can be used to screen women 61 years of age and older. Getting tested for HPV can extend the interval between normal Pap tests from three to five years.  An HPV test also should be used to screen women of any age who have unclear Pap test results.  After 59 years of age, women should have HPV testing as often as Pap tests.  Colorectal Cancer  This type of cancer can be detected and often prevented.  Routine colorectal cancer screening usually begins at 59 years of age and continues through 59 years of age.  Your health care provider may recommend screening at an earlier age if you have risk factors for  colon cancer.  Your health care provider may also recommend using home test kits to check for hidden blood in the stool.  A small camera at the end of a tube can be used to examine your colon directly (sigmoidoscopy or colonoscopy). This is done to check for the earliest forms of colorectal cancer.  Routine screening usually begins at age 26.  Direct examination of the colon should be repeated every 5-10 years through 59 years of age. However, you may need to be screened more often if early forms of precancerous polyps or small growths are found. Skin Cancer  Check your skin from head to toe regularly.  Tell your health care provider about any new moles or changes in moles, especially if there is a change in a mole's shape or color.  Also tell your health care provider if you have a mole that is larger than the size of a pencil eraser.  Always use sunscreen. Apply sunscreen liberally and repeatedly throughout the day.  Protect yourself by wearing long sleeves, pants, a wide-brimmed hat,  and sunglasses whenever you are outside. HEART DISEASE, DIABETES, AND HIGH BLOOD PRESSURE   Have your blood pressure checked at least every 1-2 years. High blood pressure causes heart disease and increases the risk of stroke.  If you are between 80 years and 29 years old, ask your health care provider if you should take aspirin to prevent strokes.  Have regular diabetes screenings. This involves taking a blood sample to check your fasting blood sugar level.  If you are at a normal weight and have a low risk for diabetes, have this test once every three years after 59 years of age.  If you are overweight and have a high risk for diabetes, consider being tested at a younger age or more often. PREVENTING INFECTION  Hepatitis B  If you have a higher risk for hepatitis B, you should be screened for this virus. You are considered at high risk for hepatitis B if:  You were born in a country where hepatitis B is common. Ask your health care provider which countries are considered high risk.  Your parents were born in a high-risk country, and you have not been immunized against hepatitis B (hepatitis B vaccine).  You have HIV or AIDS.  You use needles to inject street drugs.  You live with someone who has hepatitis B.  You have had sex with someone who has hepatitis B.  You get hemodialysis treatment.  You take certain medicines for conditions, including cancer, organ transplantation, and autoimmune conditions. Hepatitis C  Blood testing is recommended for:  Everyone born from [birth year] through 1965.  Anyone with known risk factors for hepatitis C. Sexually transmitted infections (STIs)  You should be screened for sexually transmitted infections (STIs) including gonorrhea and chlamydia if:  You are sexually active and are younger than 59 years of age.  You are older than 59 years of age and your health care provider tells you that you are at risk for this type of infection.  Your  sexual activity has changed since you were last screened and you are at an increased risk for chlamydia or gonorrhea. Ask your health care provider if you are at risk.  If you do not have HIV, but are at risk, it may be recommended that you take a prescription medicine daily to prevent HIV infection. This is called pre-exposure prophylaxis (PrEP). You are considered at risk if:  You are sexually active and do  not regularly use condoms or know the HIV status of your partner(s).  You take drugs by injection.  You are sexually active with a partner who has HIV. Talk with your health care provider about whether you are at high risk of being infected with HIV. If you choose to begin PrEP, you should first be tested for HIV. You should then be tested every 3 months for as long as you are taking PrEP.  PREGNANCY   If you are premenopausal and you may become pregnant, ask your health care provider about preconception counseling.  If you may become pregnant, take 400 to 800 micrograms (mcg) of folic acid every day.  If you want to prevent pregnancy, talk to your health care provider about birth control (contraception). OSTEOPOROSIS AND MENOPAUSE   Osteoporosis is a disease in which the bones lose minerals and strength with aging. This can result in serious bone fractures. Your risk for osteoporosis can be identified using a bone density scan.  If you are 8 years of age or older, or if you are at risk for osteoporosis and fractures, ask your health care provider if you should be screened.  Ask your health care provider whether you should take a calcium or vitamin D supplement to lower your risk for osteoporosis.  Menopause may have certain physical symptoms and risks.  Hormone replacement therapy may reduce some of these symptoms and risks. Talk to your health care provider about whether hormone replacement therapy is right for you.  HOME CARE INSTRUCTIONS   Schedule regular health, dental, and  eye exams.  Stay current with your immunizations.   Do not use any tobacco products including cigarettes, chewing tobacco, or electronic cigarettes.  If you are pregnant, do not drink alcohol.  If you are breastfeeding, limit how much and how often you drink alcohol.  Limit alcohol intake to no more than 1 drink per day for nonpregnant women. One drink equals 12 ounces of beer, 5 ounces of wine, or 1 ounces of hard liquor.  Do not use street drugs.  Do not share needles.  Ask your health care provider for help if you need support or information about quitting drugs.  Tell your health care provider if you often feel depressed.  Tell your health care provider if you have ever been abused or do not feel safe at home. Document Released: 04/13/2011 Document Revised: 02/12/2014 Document Reviewed: 08/30/2013 [facility] Patient Information 2015 [place], [place]. This information is not intended to replace advice given to you by your health care provider. Make sure you discuss any questions you have with your health care provider.

## 2015-04-24 NOTE — Progress Notes (Signed)
Pre visit review using our clinic review tool, if applicable. No additional management support is needed unless otherwise documented below in the visit note. 

## 2015-04-24 NOTE — Progress Notes (Signed)
Subjective:    Patient ID: Tammy Kennedy, female    DOB: [birth year], 59 y.o.   MRN: [id number]  HPI  patient is here today for annual physical. Patient feels well and has no complaints. Reviewed chronic medical issues, interval events and current concerns  Past Medical History  Diagnosis Date  . HYPOTHYROIDISM   . HYPERLIPIDEMIA     intol of statin trials  . ANEMIA   . ANXIETY DISORDER   . DEPRESSION   . CROHN'S DISEASE   . OSTEOPENIA   . ALCOHOL ABUSE, HX OF   . HEPATOMEGALY, HX OF   . DRY EYE SYNDROME   . Tubular adenoma 2013  . C. difficile colitis   . Rectovaginal fistula 2012    chron's disease   Family History  Problem Relation Age of Onset  . Adopted: Yes  . Alzheimer's disease Mother 85   History  Substance Use Topics  . Smoking status: Never Smoker   . Smokeless tobacco: Never Used  . Alcohol Use: 12.0 oz/week    20 Glasses of wine per week    Review of Systems  Constitutional: Negative for fatigue and unexpected weight change.  Respiratory: Negative for cough, shortness of breath and wheezing.   Cardiovascular: Negative for chest pain, palpitations and leg swelling.  Gastrointestinal: Negative for nausea, abdominal pain and diarrhea.  Neurological: Negative for dizziness, weakness, light-headedness and headaches.  Psychiatric/Behavioral: Negative for dysphoric mood. The patient is not nervous/anxious.   All other systems reviewed and are negative.      Objective:    Physical Exam  Constitutional: She is oriented to person, place, and time. She appears well-developed and well-nourished. No distress.  HENT:  Head: Normocephalic and atraumatic.  Right Ear: External ear normal.  Left Ear: External ear normal.  Nose: Nose normal.  Mouth/Throat: Oropharynx is clear and moist. No oropharyngeal exudate.  Eyes: EOM are normal. Pupils are equal, round, and reactive to light. Right eye exhibits no discharge. Left eye exhibits no discharge. No scleral  icterus.  Neck: Normal range of motion. Neck supple. No JVD present. No tracheal deviation present. No thyromegaly present.  Cardiovascular: Normal rate, regular rhythm, normal heart sounds and intact distal pulses.  Exam reveals no friction rub.   No murmur heard. Pulmonary/Chest: Effort normal and breath sounds normal. No respiratory distress. She has no wheezes. She has no rales. She exhibits no tenderness.  Abdominal: Soft. Bowel sounds are normal. She exhibits no distension and no mass. There is no tenderness. There is no rebound and no guarding.  Genitourinary:  Defer to gyn  Musculoskeletal: Normal range of motion.  No gross deformities  Lymphadenopathy:    She has no cervical adenopathy.  Neurological: She is alert and oriented to person, place, and time. She has normal reflexes. No cranial nerve deficit.  Skin: Skin is warm and dry. No rash noted. She is not diaphoretic. No erythema.  Psychiatric: She has a normal mood and affect. Her behavior is normal. Judgment and thought content normal.  Nursing note and vitals reviewed.   BP 110/60 mmHg  Pulse 78  Temp(Src) 97.8 F (36.6 C) (Oral)  Ht 5' (1.524 m)  Wt 109 lb (49.442 kg)  BMI 21.29 kg/m2  SpO2 97%  LMP 10/12/2005 Wt Readings from Last 3 Encounters:  04/24/15 109 lb (49.442 kg)  05/29/14 108 lb (48.988 kg)  04/17/14 108 lb 2 oz (49.045 kg)    Lab Results  Component Value Date   WBC 2.5* 04/17/2014  HGB 12.7 04/17/2014   HCT 37.1 04/17/2014   PLT 246.0 04/17/2014   GLUCOSE 82 04/17/2014   CHOL 237* 04/17/2014   TRIG 271.0* 04/17/2014   HDL 81.90 04/17/2014   LDLDIRECT 80.4 07/18/2012   LDLCALC 101* 04/17/2014   ALT 18 04/17/2014   AST 33 04/17/2014   NA 140 04/17/2014   K 4.8 04/17/2014   CL 104 04/17/2014   CREATININE 0.6 04/17/2014   BUN 14 04/17/2014   CO2 30 04/17/2014   TSH 0.12* 04/17/2014    Mm Digital Screening Bilateral  05/01/2014   CLINICAL DATA:  Screening.  EXAM: DIGITAL SCREENING  BILATERAL MAMMOGRAM WITH CAD  COMPARISON:  Previous exam(s).  ACR Breast Density Category b: There are scattered areas of fibroglandular density.  FINDINGS: There are no findings suspicious for malignancy. Images were processed with CAD.  IMPRESSION: No mammographic evidence of malignancy. A result letter of this screening mammogram will be mailed directly to the patient.  RECOMMENDATION: Screening mammogram in one year. (Code:SM-B-01Y)  BI-RADS CATEGORY  1: Negative.   Electronically Signed   By: Luberta Robertson M.D.   On: 05/01/2014 10:57       Assessment & Plan:   CPX/z00.00 - Patient has been counseled on age-appropriate routine health concerns for screening and prevention. These are reviewed and up-to-date. Immunizations are up-to-date or declined. Labs ordered and reviewed.  Problem List Items Addressed This Visit    Hyperlipidemia    Intolerant of statin therapy (constipation side effects) on Tricor and lifestyle modification efforts Current labs reviewed; Continue diet/exercise efforts to control same - risks of ASD reviewed      Relevant Medications   fenofibrate (TRICOR) 145 MG tablet   Hypothyroidism    Lab Results  Component Value Date   TSH 0.12* 04/17/2014   On replacement: 75 mcg daily  Check now and adjust as needed      Osteopenia    Due for DEXA  - will schedule same Previously on boniva - stopped after 5 years therapy in 2013 ongoing Ca+D      Relevant Orders   DG Bone Density   Regional enteritis    Complicated hx, currently stable, off maintained prednisone since 2010- self DC's Azasan 10/2014 due to no refills from GI - and remians stable without flare [place] summer 2013 reviewed  Due for GI follow up - will help arrange same      Relevant Orders   Ambulatory referral to Gastroenterology    Other Visit Diagnoses    Routine general medical examination at a health care facility    -  Primary    Relevant Orders    Basic metabolic panel    CBC with  Differential/Platelet    Hepatic function panel    Lipid panel    TSH    Urinalysis, Routine w reflex microscopic (not at [facility])    Need for prophylactic vaccination against Streptococcus pneumoniae (pneumococcus)        Relevant Orders    Pneumococcal conjugate vaccine 13-valent (Completed)        Gwendolyn Grant, MD

## 2015-04-24 NOTE — Assessment & Plan Note (Signed)
Intolerant of statin therapy (constipation side effects) on Tricor and lifestyle modification efforts Current labs reviewed; Continue diet/exercise efforts to control same - risks of ASD reviewed

## 2015-04-24 NOTE — Assessment & Plan Note (Signed)
Lab Results  Component Value Date   TSH 0.12* 04/17/2014   On replacement: 75 mcg daily  Check now and adjust as needed

## 2015-04-26 ENCOUNTER — Other Ambulatory Visit: Payer: Self-pay

## 2015-04-26 DIAGNOSIS — E038 Other specified hypothyroidism: Secondary | ICD-10-CM

## 2015-04-26 MED ORDER — LEVOTHYROXINE SODIUM 88 MCG PO TABS: 88.0000 ug | ORAL_TABLET | Freq: Every day | ORAL | Status: DC

## 2015-04-29 ENCOUNTER — Ambulatory Visit (INDEPENDENT_AMBULATORY_CARE_PROVIDER_SITE_OTHER)
Admission: RE | Admit: 2015-04-29 | Discharge: 2015-04-29 | Disposition: A | Discharge status: Home / Self Care | Payer: BLUE CROSS/BLUE SHIELD | Source: Ambulatory Visit | Attending: Internal Medicine | Admitting: Internal Medicine

## 2015-04-29 DIAGNOSIS — M8580 Other specified disorders of bone density and structure, unspecified site: Secondary | ICD-10-CM | POA: Diagnosis not present

## 2015-05-14 ENCOUNTER — Other Ambulatory Visit: Payer: Self-pay | Admitting: Internal Medicine

## 2015-05-15 ENCOUNTER — Other Ambulatory Visit: Payer: Self-pay | Admitting: Internal Medicine

## 2015-06-16 ENCOUNTER — Other Ambulatory Visit: Payer: Self-pay | Admitting: Internal Medicine

## 2015-06-24 ENCOUNTER — Encounter: Payer: Self-pay | Admitting: Gastroenterology

## 2015-11-14 ENCOUNTER — Telehealth: Payer: Self-pay | Admitting: Internal Medicine

## 2015-11-18 ENCOUNTER — Other Ambulatory Visit (INDEPENDENT_AMBULATORY_CARE_PROVIDER_SITE_OTHER): Payer: BLUE CROSS/BLUE SHIELD

## 2015-11-18 ENCOUNTER — Ambulatory Visit (INDEPENDENT_AMBULATORY_CARE_PROVIDER_SITE_OTHER): Payer: BLUE CROSS/BLUE SHIELD | Admitting: Physician Assistant

## 2015-11-18 ENCOUNTER — Encounter: Payer: Self-pay | Admitting: Physician Assistant

## 2015-11-18 VITALS — BP 100/70 | HR 96 | Ht 60.0 in | Wt 107.0 lb

## 2015-11-18 DIAGNOSIS — R197 Diarrhea, unspecified: Secondary | ICD-10-CM

## 2015-11-18 DIAGNOSIS — K625 Hemorrhage of anus and rectum: Secondary | ICD-10-CM

## 2015-11-18 DIAGNOSIS — K5000 Crohn's disease of small intestine without complications: Secondary | ICD-10-CM

## 2015-11-18 LAB — COMPREHENSIVE METABOLIC PANEL
ALBUMIN: 4 g/dL (ref 3.5–5.2)
ALK PHOS: 49 U/L (ref 39–117)
ALT: 11 U/L (ref 0–35)
AST: 17 U/L (ref 0–37)
BILIRUBIN TOTAL: 0.4 mg/dL (ref 0.2–1.2)
BUN: 18 mg/dL (ref 6–23)
CO2: 30 mEq/L (ref 19–32)
Calcium: 10.5 mg/dL (ref 8.4–10.5)
Chloride: 101 mEq/L (ref 96–112)
Creatinine, Ser: 0.73 mg/dL (ref 0.40–1.20)
GFR: 86.59 mL/min (ref >60.00)
Glucose, Bld: 140 mg/dL — ABNORMAL HIGH (ref 70–99)
POTASSIUM: 4.5 meq/L (ref 3.5–5.1)
SODIUM: 137 meq/L (ref 135–145)
TOTAL PROTEIN: 7.4 g/dL (ref 6.0–8.3)

## 2015-11-18 LAB — CBC WITH DIFFERENTIAL/PLATELET
Basophils Absolute: 0 10*3/uL (ref 0.0–0.1)
Basophils Relative: 0.2 % (ref 0.0–3.0)
EOS PCT: 1 % (ref 0.0–5.0)
Eosinophils Absolute: 0.1 10*3/uL (ref 0.0–0.7)
HCT: 40.8 % (ref 36.0–46.0)
HEMOGLOBIN: 13.7 g/dL (ref 12.0–15.0)
Lymphocytes Relative: 9.7 % — ABNORMAL LOW (ref 12.0–46.0)
Lymphs Abs: 0.8 10*3/uL (ref 0.7–4.0)
MCHC: 33.5 g/dL (ref 30.0–36.0)
MCV: 97.3 fl (ref 78.0–100.0)
MONO ABS: 0.6 10*3/uL (ref 0.1–1.0)
MONOS PCT: 7.6 % (ref 3.0–12.0)
Neutro Abs: 6.8 10*3/uL (ref 1.4–7.7)
Neutrophils Relative %: 81.5 % — ABNORMAL HIGH (ref 43.0–77.0)
Platelets: 325 10*3/uL (ref 150.0–400.0)
RBC: 4.2 Mil/uL (ref 3.87–5.11)
RDW: 12.4 % (ref 11.5–15.5)
WBC: 8.4 10*3/uL (ref 4.0–10.5)

## 2015-11-18 LAB — HIGH SENSITIVITY CRP: CRP, High Sensitivity: 12.77 mg/L — ABNORMAL HIGH (ref 0.000–5.000)

## 2015-11-18 MED ORDER — BUDESONIDE 9 MG PO TB24: 9.0000 mg | ORAL_TABLET | Freq: Every day | ORAL | Status: DC

## 2015-11-18 MED ORDER — DICYCLOMINE HCL 10 MG PO CAPS: 10.0000 mg | ORAL_CAPSULE | Freq: Three times a day (TID) | ORAL | Status: DC

## 2015-11-18 MED ORDER — AZATHIOPRINE 50 MG PO TABS: 50.0000 mg | ORAL_TABLET | Freq: Every day | ORAL | Status: DC

## 2015-11-18 MED ORDER — MESALAMINE 1.2 G PO TBEC: DELAYED_RELEASE_TABLET | ORAL | Status: DC

## 2015-11-18 MED ORDER — MAGIC MOUTHWASH W/LIDOCAINE: 5.0000 mL | Freq: Four times a day (QID) | ORAL | Status: DC

## 2015-11-18 NOTE — Patient Instructions (Signed)
Please go to the basement level to have your labs drawn.  We sent prescriptions to [facility], [address].  1. Suprep for colonoscopy 2. Lialda  3. Uceeris 9 mg Imuran 50 mg Bentyl 10 mg.   You have been scheduled for a colonoscopy. Please follow written instructions given to you at your visit today.  Please pick up your prep supplies at the pharmacy within the next 1-3 days. [facility] [address] [place].  If you use inhalers (even only as needed), please bring them with you on the day of your procedure. Your physician has requested that you go to www.startemmi.com and enter the access code given to you at your visit today. This web site gives a general overview about your procedure. However, you should still follow specific instructions given to you by our office regarding your preparation for the procedure.

## 2015-11-18 NOTE — Progress Notes (Signed)
Patient ID: Tammy Kennedy Kennedy, female   DOB: 05-Aug-1956, 60 y.o.   MRN: [id number]   Subjective:    Patient ID: Tammy Kennedy Kennedy, female    DOB: [birth year], 60 y.o.   MRN: [id number]  HPI  Tammy Kennedy Kennedy  Is a pleasant 60 year old white female known to Dr. Delfin Kennedy previously with long history of Crohn'Tammy Kennedy enterocolitis and rectovaginal fistula. She was last seen in 2014. She had undergone colonoscopy in  July 2013 for surveillance and was found to have pseudopolyps in the sigmoid colon chronic fistula in the rectum and no active Kennedy in the colon or rectum. Path showed 1 tubular adenoma and no active inflammation.  Patient was felt to be in remission and at that time was maintained on Imuran 50 mg by mouth daily.  She does have history of chronic diarrhea. She had been on Remicade at one point but developed severe myalgias and this was discontinued.   patient has not been seen in the office over the past 2-1/2 years. She says she took Imuran until her prescription ran out in 2015 and was unable to get it refilled. She did not have any symptoms up until about 3 weeks ago when she   Developed bloody mucoid drainage with her bowel movements. Says she is having more diarrhea than usual. Her normal is to have 2-3 loose bowel movements per day and is now having 5-6 loose bowel movements per day and with each bowel movement noticing a proceed mucoid substance and dark blood. She feels like she has had some low-grade temps but has not actually taken her temperature. Has no complaints of abdominal pain or cramping and says she never has with her Crohn'Tammy Kennedy. No nausea or vomiting. He has developed oral ulcers which is very typical of her Crohn'Tammy Kennedy flares as well. She says Magic mouthwash is quite helpful.  She has not been on any new medications no recent antibiotics and says she is always under stress but has not had any recent changes.  She says she passes gas through the rectovaginal fistula but never has any feculent  drainage from the vagina that she is aware of.  Review of Systems Pertinent positive and negative review of systems were noted in the above HPI section.  All other review of systems was otherwise negative.  Outpatient Encounter Prescriptions as of 11/18/2015  Medication Sig  . calcium carbonate (OS-CAL - DOSED IN MG OF ELEMENTAL CALCIUM) 1250 (500 CA) MG tablet Take 1 tablet by mouth.  . fenofibrate (TRICOR) 145 MG tablet Take 1 tablet (145 mg total) by mouth daily.  Marland Kitchen levothyroxine (SYNTHROID, LEVOTHROID) 88 MCG tablet Take 1 tablet (88 mcg total) by mouth daily. TSH lab to be completed around the end of October.  Marland Kitchen LORazepam (ATIVAN) 0.5 MG tablet Take 0.5 mg by mouth daily as needed.    . Potassium Gluconate 550 MG TABS Take 2 tablets by mouth daily.   . Sennosides (EX-LAX PO) Take by mouth as directed.  . valACYclovir (VALTREX) 500 MG tablet Take 1 tablet (500 mg total) by mouth daily as needed.  . venlafaxine XR (EFFEXOR-XR) 150 MG 24 hr capsule Take 1 capsule (150 mg total) by mouth daily with breakfast.  . vitamin B-12 (CYANOCOBALAMIN) 500 MCG tablet Take 500 mcg by mouth daily.    Marland Kitchen azaTHIOprine (IMURAN) 50 MG tablet Take 1 tablet (50 mg total) by mouth daily.  . Budesonide (UCERIS) 9 MG TB24 Take 9 mg by mouth daily.  Marland Kitchen dicyclomine (BENTYL) 10  MG capsule Take 1 capsule (10 mg total) by mouth 3 (three) times daily before meals.  . folic acid (FOLVITE) 1 MG tablet Take 1 tablet (1 mg total) by mouth daily.  . magic mouthwash w/lidocaine SOLN Take 5 mLs by mouth 4 (four) times daily.  . mesalamine (LIALDA) 1.2 g EC tablet Take 2 tablets twice daily  . [DISCONTINUED] valACYclovir (VALTREX) 500 MG tablet Take 1 tablet (500 mg total) by mouth daily.   No facility-administered encounter medications on file as of 11/18/2015.   No Known Allergies Patient Active Problem List   Diagnosis Date Noted  . Rectovaginal fistula   . C. difficile colitis   . Abnormal LFTs   . Regional enteritis  (Tammy Kennedy Kennedy) 05/27/2009  . HEPATOMEGALY, HX OF 08/31/2008  . Hypothyroidism 07/30/2008  . Hyperlipidemia 07/30/2008  . ANEMIA 07/30/2008  . ANXIETY DISORDER 07/30/2008  . DEPRESSION 07/30/2008  . RENAL CYST 07/30/2008  . DEGENERATIVE DISC Kennedy 07/30/2008  . Osteopenia 07/30/2008  . ALCOHOL ABUSE, HX OF 07/30/2008   Social History   Social History  . Marital Status: Divorced    Spouse Name: N/A  . Number of Children: N/A  . Years of Education: N/A   Occupational History  . Not on file.   Social History Main Topics  . Smoking status: Never Smoker   . Smokeless tobacco: Never Used  . Alcohol Use: 12.0 oz/week    20 Glasses of wine per week  . Drug Use: No  . Sexual Activity: Not on file   Other Topics Concern  . Not on file   Social History Narrative   Works at [employer]- part time    Tammy Kennedy Kennedy family history includes Alzheimer'Tammy Kennedy Kennedy (age of onset: 25) in her mother. She was adopted.      Objective:    Filed Vitals:   11/18/15 1405  BP: 100/70  Pulse: 96    Physical Exam   Well-developed older white female in no acute distress, pleasant blood pressure 100/70 pulse 96 height 5 foot weight 107. HEENT; nontraumatic Norma cephalic EOMI PERRLA sclera anicteric, Cardiovascular;regular rate and rhythm with S1-S2 no murmur or gallop, Pulmonary; clear bilaterally, Abdomen ;soft basically nontender there is no palpable mass or hepatosplenomegaly bowel sounds are present, Rectal; exam no external lesions or fistulas noted she does have a nodular anus , no mass palpated mucoid heme on examining glove , Ext;no clubbing cyanosis or edema skin warm and dry, Neuropsych; mood and affect appropriate     Assessment & Plan:    #1 60 yo female with long hx of Crohns ileocolitis and chronic recto-vaginal fistula  Who had been in remission at time of last colonoscopy 2013 and has not been on any maintenance medication over the past 2 years now  presenting with 3 week history of increase in chronic diarrhea and bloody mucoid stool  Sxs consistent with exacerbation of Crohns colitis. #2  Remote history of C. Difficile colitis # 3 anxiety /depression  #4 hypothyroidism  #5 prior history of EtOH abuse, not discussed today  Plan; CBC with differential, see met, CRP, QuantiFERON Gold and hepatitis B surface antigen  Start Lialda2.4 g by mouth twice a day  Start Uceris 9 mg by mouth every morning  Bentyl 10 mg half hour before meals when necessary for spasm/urgency  Magic mouthwash 5 mL swish and swallow 4 times daily 2 weeks then when necessary  Restart Imuran 50 mg by mouth every morning  Schedule for colonoscopy with  Dr. Silverio Decamp . Procedure discussed in detail with patient and she is agreeable to proceed  Patient is advised to call in 2 weeks , if she is not making any improvement Will need to start prednisone. Also asked to asked to call in the interim for any worsening of symptoms.  Tammy Kennedy Kennedy Tammy Kennedy Shoshanna Mcquitty PA-C 11/18/2015   Cc: Rowe Clack, MD

## 2015-11-19 LAB — HEPATITIS B SURFACE ANTIGEN: Hepatitis B Surface Ag: NEGATIVE

## 2015-11-20 NOTE — Progress Notes (Signed)
Reviewed and agree with documentation and assessment and plan. K. Veena Makiah Clauson , MD   

## 2015-11-21 LAB — QUANTIFERON TB GOLD ASSAY (BLOOD)
Interferon Gamma Release Assay: NEGATIVE
QUANTIFERON NIL VALUE: 0.06 [IU]/mL
QUANTIFERON TB AG MINUS NIL: 0 [IU]/mL
TB Ag value: 0.06 IU/mL

## 2015-11-29 ENCOUNTER — Other Ambulatory Visit: Payer: Self-pay

## 2015-11-29 MED ORDER — NA SULFATE-K SULFATE-MG SULF 17.5-3.13-1.6 GM/177ML PO SOLN: 1.0000 | ORAL | Status: DC

## 2015-12-06 ENCOUNTER — Other Ambulatory Visit: Payer: BLUE CROSS/BLUE SHIELD

## 2015-12-06 ENCOUNTER — Encounter: Payer: Self-pay | Admitting: Gastroenterology

## 2015-12-06 ENCOUNTER — Other Ambulatory Visit: Payer: Self-pay

## 2015-12-06 ENCOUNTER — Ambulatory Visit (AMBULATORY_SURGERY_CENTER): Payer: BLUE CROSS/BLUE SHIELD | Admitting: Gastroenterology

## 2015-12-06 VITALS — BP 112/62 | HR 69 | Temp 99.1°F | Resp 15 | Ht 60.0 in | Wt 107.0 lb

## 2015-12-06 DIAGNOSIS — R197 Diarrhea, unspecified: Secondary | ICD-10-CM

## 2015-12-06 DIAGNOSIS — K5000 Crohn's disease of small intestine without complications: Secondary | ICD-10-CM

## 2015-12-06 DIAGNOSIS — K50111 Crohn's disease of large intestine with rectal bleeding: Secondary | ICD-10-CM

## 2015-12-06 MED ORDER — MESALAMINE 4 G RE ENEM: 4.0000 g | ENEMA | Freq: Every day | RECTAL | Status: DC

## 2015-12-06 MED ORDER — SODIUM CHLORIDE 0.9 % IV SOLN: 500.0000 mL | INTRAVENOUS | Status: DC

## 2015-12-06 MED ORDER — METRONIDAZOLE 500 MG PO TABS: 500.0000 mg | ORAL_TABLET | Freq: Three times a day (TID) | ORAL | Status: DC

## 2015-12-06 MED ORDER — PREDNISONE 5 MG PO TABS: 5.0000 mg | ORAL_TABLET | Freq: Every day | ORAL | Status: DC

## 2015-12-06 NOTE — Progress Notes (Signed)
Specimen collected for CDIF, during procedure. Taken to basement lab.

## 2015-12-06 NOTE — Progress Notes (Signed)
Called to room to assist during endoscopic procedure.  Patient ID and intended procedure confirmed with present staff. Received instructions for my participation in the procedure from the performing physician.  

## 2015-12-06 NOTE — Patient Instructions (Addendum)
YOU HAD AN ENDOSCOPIC PROCEDURE TODAY AT [facility]:   Refer to the procedure report that was given to you for any specific questions about what was found during the examination.  If the procedure report does not answer your questions, please call your gastroenterologist to clarify.  If you requested that your care partner not be given the details of your procedure findings, then the procedure report has been included in a sealed envelope for you to review at your convenience later.  YOU SHOULD EXPECT: Some feelings of bloating in the abdomen. Passage of more gas than usual.  Walking can help get rid of the air that was put into your GI tract during the procedure and reduce the bloating. If you had a lower endoscopy (such as a colonoscopy or flexible sigmoidoscopy) you may notice spotting of blood in your stool or on the toilet paper. If you underwent a bowel prep for your procedure, you may not have a normal bowel movement for a few days.  Please Note:  You might notice some irritation and congestion in your nose or some drainage.  This is from the oxygen used during your procedure.  There is no need for concern and it should clear up in a day or so.  SYMPTOMS TO REPORT IMMEDIATELY:   Following lower endoscopy (colonoscopy or flexible sigmoidoscopy):  Excessive amounts of blood in the stool  Significant tenderness or worsening of abdominal pains  Swelling of the abdomen that is new, acute  Fever of 100F or higher  For urgent or emergent issues, a gastroenterologist can be reached at any hour by calling [phone].   DIET: Your first meal following the procedure should be a small meal and then it is ok to progress to your normal diet. Heavy or fried foods are harder to digest and may make you feel nauseous or bloated.  Likewise, meals heavy in dairy and vegetables can increase bloating.  Drink plenty of fluids but you should avoid alcoholic beverages for 24  hours.  ACTIVITY:  You should plan to take it easy for the rest of today and you should NOT DRIVE or use heavy machinery until tomorrow (because of the sedation medicines used during the test).    FOLLOW UP: Our staff will call the number listed on your records the next business day following your procedure to check on you and address any questions or concerns that you may have regarding the information given to you following your procedure. If we do not reach you, we will leave a message.  However, if you are feeling well and you are not experiencing any problems, there is no need to return our call.  We will assume that you have returned to your regular daily activities without incident.  If any biopsies were taken you will be contacted by phone or by letter within the next 1-3 weeks.  Please call us at [phone] if you have not heard about the biopsies in 3 weeks.    SIGNATURES/CONFIDENTIALITY: You and/or your care partner have signed paperwork which will be entered into your electronic medical record.  These signatures attest to the fact that that the information above on your After Visit Summary has been reviewed and is understood.  Full responsibility of the confidentiality of this discharge information lies with you and/or your care-partner.  Await pathology results Pick prescriptions Flagyl and Prednisone, Rowasa enema at [facility] follow up March 6th @ 11am with Amy-PA

## 2015-12-06 NOTE — Op Note (Addendum)
[place]  [address] [place], [id number]   COLONOSCOPY PROCEDURE REPORT  PATIENT: Tammy Kennedy, Tammy Kennedy  MR#: [id number] BIRTHDATE: 03/25/1956 , 40  yrs. old GENDER: female ENDOSCOPIST: Harl Bowie, MD REFERRED [place]:[id number] Evalina Field, M.D. PROCEDURE DATE:  12/06/2015 PROCEDURE:   Colonoscopy, diagnostic and Colonoscopy with biopsy First Screening Colonoscopy - Avg.  risk and is 50 yrs.  old or older - No.  Prior Negative Screening - Now for repeat screening. N/A  History of Adenoma - Now for follow-up colonoscopy & has been > or = to 3 yrs.  N/A  Polyps removed today? No Recommend repeat exam, <10 yrs? Yes high risk ASA CLASS:   Class II INDICATIONS:Inflammatory bowel disease of the intestine if more precise diagnosis or determination of the extent / severity of activity of disease will influence immediate / future management, Evaluation of unexplained GI bleeding, and Inflammatory Bowel Disease ( = 8 years pancolitis or = 15 years left sided colitis).  MEDICATIONS: Propofol 350 mg IV  DESCRIPTION OF PROCEDURE:   After the risks benefits and alternatives of the procedure were thoroughly explained, informed consent was obtained.  The digital rectal exam revealed no abnormalities of the rectum.   The LB [id number]  endoscope was introduced through the anus and advanced to the cecum, which was identified by both the appendix and ileocecal valve. No adverse events experienced.   The quality of the prep was good.  The instrument was then slowly withdrawn as the colon was fully examined. Estimated blood loss is zero unless otherwise noted in this procedure report.   COLON FINDINGS: Terminal ileum could not be intubated, noted ulceration and possible stricture.  Severe ulceration in the rectum from anal verge to 15cm random biopsies were obtained.  Colon muocsa appeared to be spared from 15cm to 40cm.  Above 40cm noted vascular changes with aphthous ulcers  and mild to moderate colitis, random biopsies were obtained.  Retroflexion was not performed due to a narrow rectal vault and severe proctitis. The time to cecum = 8.6 Withdrawal time = 13.6   The scope was withdrawn and the procedure completed. COMPLICATIONS: There were no immediate complications.  ENDOSCOPIC IMPRESSION: Terminal ileum could not be intubated, noted ulceration and possible stricture.  Severe ulceration in the rectum from anal verge to 15cm random biopsies were obtained.  Colon muocsa appeared to be spared from 15cm to 40cm.  Above 40cm noted vascular changes with aphthous ulcers and mild to moderate colitis  RECOMMENDATIONS: Flagyl 500 mg TID x 2 weeks Follow up C.diff  and start Prednisone if C.diff negative. If C.diff positive will wait until Monday to start Prednisone taper (Prednisone 56m dailyx 2 weeks followed by slow taper 122mevery week) Rowasa enema at bedtime x 2 weeks Follow up in office in 1-2 weeks DC Uceris Will have to consider starting biologics  eSigned:  KaHarl BowieMD 12/06/2015 4:41 PM Revised: 12/06/2015 4:41 PM     PATIENT NAME:  [name], RippR#: [id number]

## 2015-12-06 NOTE — Progress Notes (Signed)
Patient reported increase in bowel frequency to 10-12 BM per day with mucus and blood. C.diff was not checked at office visit, colonic lavage sent to check for C.diff. Will wait for results prior to starting prednisone taper for severe proctitis. Start Flagyl 5101m TID

## 2015-12-06 NOTE — Progress Notes (Signed)
A/ox3 pleased with MAC, report to [facility]

## 2015-12-09 ENCOUNTER — Telehealth: Payer: Self-pay | Admitting: *Deleted

## 2015-12-09 NOTE — Telephone Encounter (Signed)
  Follow up Call-  Call back number 12/06/2015  Post procedure Call Back phone  # [phone]  Permission to leave phone message Yes     Patient questions:  Do you have a fever, pain , or abdominal swelling? No. Pain Score  0 *  Have you tolerated food without any problems? Yes.    Have you been able to return to your normal activities? Yes.    Do you have any questions about your discharge instructions: Diet   No. Medications  No. Follow up visit  No.  Do you have questions or concerns about your Care? No.  Actions: * If pain score is 4 or above: No action needed, pain <4.

## 2015-12-14 LAB — CLOSTRIDIUM DIFFICILE CULTURE-FECAL

## 2015-12-16 ENCOUNTER — Ambulatory Visit (INDEPENDENT_AMBULATORY_CARE_PROVIDER_SITE_OTHER): Payer: BLUE CROSS/BLUE SHIELD | Admitting: Physician Assistant

## 2015-12-16 ENCOUNTER — Encounter: Payer: Self-pay | Admitting: Physician Assistant

## 2015-12-16 VITALS — BP 102/60 | HR 72 | Ht 59.75 in | Wt 104.0 lb

## 2015-12-16 DIAGNOSIS — R1031 Right lower quadrant pain: Secondary | ICD-10-CM

## 2015-12-16 DIAGNOSIS — K5080 Crohn's disease of both small and large intestine without complications: Secondary | ICD-10-CM

## 2015-12-16 DIAGNOSIS — R197 Diarrhea, unspecified: Secondary | ICD-10-CM

## 2015-12-16 MED ORDER — PREDNISONE 10 MG PO TABS: ORAL_TABLET | ORAL | Status: DC

## 2015-12-16 MED ORDER — OXYCODONE-ACETAMINOPHEN 5-325 MG PO TABS: ORAL_TABLET | ORAL | Status: DC

## 2015-12-16 MED ORDER — MESALAMINE 1000 MG RE SUPP: 1000.0000 mg | Freq: Every day | RECTAL | Status: DC

## 2015-12-16 NOTE — Progress Notes (Signed)
Reviewed and agree with documentation and assessment and plan. C. difficile negative, agree with starting prednisone taper. We can add Cipro to the course of Flagyl x2 weeks given C. difficile negative. We'll also consider to initiate on Humira in 3-4 weeks K. Denzil Magnuson , MD

## 2015-12-16 NOTE — Patient Instructions (Signed)
We sent prescriptions to [facility], [address].  1. Canasa suppositories 2. We gave you a printed prescription for Percocet.  3. Prednisone 10 mg, take as direcred.   Prednisone 10 mg: Take 40 mg ( 4 tablets ) daily x 14 days. Take 30 mg ( 3 tablets) daily x 7 days. Take 20 mg ( 2 tablets ) daily x 7 days. Take 10 mg ( 1 tablet )   Daily x 7 days. Take 5 mg ( 1/2 tablet ) daily x 7 days.   Take all your other medications the same.

## 2015-12-16 NOTE — Progress Notes (Signed)
Patient ID: Tammy Kennedy, female   DOB: 06/30/1956, 60 y.o.   MRN: [id number]   Subjective:    Patient ID: Tammy Kennedy, female    DOB: Jan 08, 1956, 60 y.o.   MRN: [id number]  HPI  Tammy Kennedy is a 60 year old white female former patient of Dr. Delfin Kennedy is now established with Dr. Silverio Kennedy with long history of Crohn's enterocolitis and prior history of rectovaginal fistula. She was seen in the office in early February after not being seen for almost 3 years and had not been on any medications. Prior to that she had been in remission. When seen in early February she was started on Uceris 9 mg by mouth daily ,lialda  and Imuran 50 mg by mouth daily which she had tolerated well in the past. Was scheduled for colonoscopy which was done on 12/06/2015 and showed ulcerations of the terminal ileum with inability to intubate the ileum with probable stricture, there was severe ulceration in the rectum from the anal verge to 15 cm then sparing to 40 cm. Above 40 cm there were aphthous ulcers and mild to moderate colitis. Patient was to start prednisone but to submit a stool for C. difficile prior to starting. She submitted a stool specimen which was negative , but had not gotten the results back so she has not started on any prednisone. [name] She comes in today stating that she's been feeling terrible with significant fatigue. She complains of joint aches being muscle aching and ongoing right-sided abdominal pain which is been pretty intense at times. She's having 7 or 8 liquidy bowel movements per day usually with small amounts of bright red blood. She is not running any fevers. She says she was unable to do the Rowasa enemas as she could not retain them. She is completing a course of metronidazole She has started on the Imuran.  Review of Systems Pertinent positive and negative review of systems were noted in the above HPI section.  All other review of systems was otherwise negative.  Outpatient Encounter  Prescriptions as of 12/16/2015  Medication Sig  . azaTHIOprine (IMURAN) 50 MG tablet Take 1 tablet (50 mg total) by mouth daily.  . Budesonide (UCERIS) 9 MG TB24 Take 9 mg by mouth daily.  . calcium carbonate (OS-CAL - DOSED IN MG OF ELEMENTAL CALCIUM) 1250 (500 CA) MG tablet Take 1 tablet by mouth.  . dicyclomine (BENTYL) 10 MG capsule Take 1 capsule (10 mg total) by mouth 3 (three) times daily before meals.  . fenofibrate (TRICOR) 145 MG tablet Take 1 tablet (145 mg total) by mouth daily.  Marland Kitchen levothyroxine (SYNTHROID, LEVOTHROID) 88 MCG tablet Take 1 tablet (88 mcg total) by mouth daily. TSH lab to be completed around the end of October.  Marland Kitchen LORazepam (ATIVAN) 0.5 MG tablet Take 0.5 mg by mouth daily as needed.    . magic mouthwash w/lidocaine SOLN Take 5 mLs by mouth 4 (four) times daily.  . metroNIDAZOLE (FLAGYL) 500 MG tablet Take 1 tablet (500 mg total) by mouth 3 (three) times daily.  . Potassium Gluconate 550 MG TABS Take 2 tablets by mouth daily.   . Sennosides (EX-LAX PO) Take by mouth as directed.  . valACYclovir (VALTREX) 500 MG tablet Take 1 tablet (500 mg total) by mouth daily as needed.  . venlafaxine XR (EFFEXOR-XR) 150 MG 24 hr capsule Take 1 capsule (150 mg total) by mouth daily with breakfast.  . vitamin B-12 (CYANOCOBALAMIN) 500 MCG tablet Take 500 mcg by mouth daily.    . [  DISCONTINUED] predniSONE (DELTASONE) 5 MG tablet Take 1 tablet (5 mg total) by mouth daily with breakfast.  . folic acid (FOLVITE) 1 MG tablet Take 1 tablet (1 mg total) by mouth daily.  . mesalamine (CANASA) 1000 MG suppository Place 1 suppository (1,000 mg total) rectally at bedtime.  . mesalamine (ROWASA) 4 g enema Place 60 mLs (4 g total) rectally at bedtime. (Patient not taking: Reported on 12/16/2015)  . oxyCODONE-acetaminophen (PERCOCET/ROXICET) 5-325 MG tablet Take 1 tab every 4-6 hours as needed for pain.  . predniSONE (DELTASONE) 10 MG tablet Take as directed, start with 40 mg every am x 14 days, then  take 30 mg daily x 7 days, then 20 mg daily x 7 days, then 10 mg x 7 days , then 5 mg x 7 days.   No facility-administered encounter medications on file as of 12/16/2015.   No Known Allergies Patient Active Problem List   Diagnosis Date Noted  . Rectovaginal fistula   . C. difficile colitis   . Abnormal LFTs   . Regional enteritis ([place]) 05/27/2009  . HEPATOMEGALY, HX OF 08/31/2008  . Hypothyroidism 07/30/2008  . Hyperlipidemia 07/30/2008  . ANEMIA 07/30/2008  . ANXIETY DISORDER 07/30/2008  . DEPRESSION 07/30/2008  . RENAL CYST 07/30/2008  . DEGENERATIVE DISC DISEASE 07/30/2008  . Osteopenia 07/30/2008  . ALCOHOL ABUSE, HX OF 07/30/2008   Social History   Social History  . Marital Status: Divorced    Spouse Name: N/A  . Number of Children: N/A  . Years of Education: N/A   Occupational History  . Not on file.   Social History Main Topics  . Smoking status: Never Smoker   . Smokeless tobacco: Never Used  . Alcohol Use: 12.0 oz/week    20 Glasses of wine per week  . Drug Use: No  . Sexual Activity: Not on file   Other Topics Concern  . Not on file   Social History Narrative   Works at [employer]- [employer]- part time    Ms. Tammy Kennedy family history includes Alzheimer's disease (age of onset: 32) in her mother. She was adopted.      Objective:    Filed Vitals:   12/16/15 1104  BP: 102/60  Pulse: 72    Physical Exam  well-developed older white female in no acute distress, somewhat chronically ill-appearing blood pressure 102/60 pulse 72 height 4 foot 11 weight 104. HEENT; nontraumatic, cephalic EOMI PERRLA sclera anicteric, Cardiovascular ;regular rate and rhythm with S1-S2 no murmur or gallop, Pulmonary; clear bilaterally, Abdomen; soft she is tender in the right lower right mid quadrant and mildly in the left lower quadrant there is no guarding or rebound no palpable mass or hepatosplenomegaly bowel sounds are present, extremities; no  clubbing cyanosis or edema skin warm and dry, Neuropsych ;mood and affect appropriate     Assessment & Plan:   #1 60  Yo female With long history of Crohn's enterocolitis now with moderate to severe disease documented on recent colonoscopy as outlined above. Patient quite symptomatic with abdominal pain and fatigue, ongoing diarrhea and arthralgias. Recent C. difficile negative  Plan; start prednisone 40 mg by mouth every morning 2 weeks then decrease to 35 mg by mouth every morning 1 week then 30 mg by mouth every morning 1 week and to continue 5 mg per week taper until done Recent hep B serologies and QuantiFERON Gold negative-will initiate preauthorization process for Humira. Patient had tried Remicade in the past and reacted Will send Rx  for Canasa suppositories daily at bedtime as unable to retain enemas at present She will continue Imuran 50 mg by mouth every morning Stop Uceris once she starts prednisone Percocet 5/325 one every 4-6 hours as needed for pain short-term prescription only Patient will follow-up with Dr. Silverio Kennedy  in 3-4 weeks.   Caidence Kaseman Genia Harold PA-C 12/16/2015   Cc: Janith Lima, MD

## 2015-12-17 ENCOUNTER — Other Ambulatory Visit: Payer: Self-pay

## 2015-12-17 ENCOUNTER — Other Ambulatory Visit: Payer: Self-pay | Admitting: Gastroenterology

## 2015-12-17 MED ORDER — METRONIDAZOLE 500 MG PO TABS: 500.0000 mg | ORAL_TABLET | Freq: Two times a day (BID) | ORAL | Status: DC

## 2015-12-17 MED ORDER — CIPROFLOXACIN HCL 500 MG PO TABS: 500.0000 mg | ORAL_TABLET | Freq: Two times a day (BID) | ORAL | Status: DC

## 2015-12-18 ENCOUNTER — Encounter: Payer: Self-pay | Admitting: Gastroenterology

## 2015-12-22 ENCOUNTER — Other Ambulatory Visit: Payer: Self-pay | Admitting: Internal Medicine

## 2015-12-25 ENCOUNTER — Telehealth: Payer: Self-pay

## 2015-12-31 NOTE — Telephone Encounter (Signed)
Humira approved by insurance. [facility] is ready to talk with the patient on delivery. They will wait until we call them.

## 2016-01-14 ENCOUNTER — Encounter: Payer: Self-pay | Admitting: Gastroenterology

## 2016-01-14 ENCOUNTER — Ambulatory Visit (INDEPENDENT_AMBULATORY_CARE_PROVIDER_SITE_OTHER): Payer: BLUE CROSS/BLUE SHIELD | Admitting: Gastroenterology

## 2016-01-14 ENCOUNTER — Other Ambulatory Visit (INDEPENDENT_AMBULATORY_CARE_PROVIDER_SITE_OTHER): Payer: BLUE CROSS/BLUE SHIELD

## 2016-01-14 VITALS — BP 100/70 | HR 80 | Ht 59.5 in | Wt 107.4 lb

## 2016-01-14 DIAGNOSIS — K5080 Crohn's disease of both small and large intestine without complications: Secondary | ICD-10-CM | POA: Diagnosis not present

## 2016-01-14 DIAGNOSIS — K5090 Crohn's disease, unspecified, without complications: Secondary | ICD-10-CM

## 2016-01-14 LAB — COMPREHENSIVE METABOLIC PANEL
ALBUMIN: 3.9 g/dL (ref 3.5–5.2)
ALT: 8 U/L (ref 0–35)
AST: 15 U/L (ref 0–37)
Alkaline Phosphatase: 29 U/L — ABNORMAL LOW (ref 39–117)
BILIRUBIN TOTAL: 0.2 mg/dL (ref 0.2–1.2)
BUN: 23 mg/dL (ref 6–23)
CALCIUM: 10.2 mg/dL (ref 8.4–10.5)
CO2: 32 mEq/L (ref 19–32)
CREATININE: 0.83 mg/dL (ref 0.40–1.20)
Chloride: 103 mEq/L (ref 96–112)
GFR: 74.63 mL/min (ref >60.00)
Glucose, Bld: 94 mg/dL (ref 70–99)
Potassium: 4.2 mEq/L (ref 3.5–5.1)
Sodium: 142 mEq/L (ref 135–145)
TOTAL PROTEIN: 7 g/dL (ref 6.0–8.3)

## 2016-01-14 LAB — CBC WITH DIFFERENTIAL/PLATELET
BASOS ABS: 0.1 10*3/uL (ref 0.0–0.1)
Basophils Relative: 1.4 % (ref 0.0–3.0)
Eosinophils Absolute: 0.2 10*3/uL (ref 0.0–0.7)
Eosinophils Relative: 2.9 % (ref 0.0–5.0)
HCT: 39.2 % (ref 36.0–46.0)
Hemoglobin: 13.1 g/dL (ref 12.0–15.0)
LYMPHS ABS: 2.3 10*3/uL (ref 0.7–4.0)
Lymphocytes Relative: 40.4 % (ref 12.0–46.0)
MCHC: 33.5 g/dL (ref 30.0–36.0)
MCV: 96.8 fl (ref 78.0–100.0)
MONO ABS: 0.3 10*3/uL (ref 0.1–1.0)
Monocytes Relative: 5.6 % (ref 3.0–12.0)
NEUTROS PCT: 49.7 % (ref 43.0–77.0)
Neutro Abs: 2.9 10*3/uL (ref 1.4–7.7)
PLATELETS: 400 10*3/uL (ref 150.0–400.0)
RBC: 4.05 Mil/uL (ref 3.87–5.11)
RDW: 16.1 % — ABNORMAL HIGH (ref 11.5–15.5)
WBC: 5.8 10*3/uL (ref 4.0–10.5)

## 2016-01-14 LAB — SEDIMENTATION RATE: SED RATE: 25 mm/h — AB (ref 0–22)

## 2016-01-14 LAB — HIGH SENSITIVITY CRP: CRP HIGH SENSITIVITY: 2.98 mg/L (ref 0.000–5.000)

## 2016-01-14 MED ORDER — AZATHIOPRINE 50 MG PO TABS: 100.0000 mg | ORAL_TABLET | Freq: Every day | ORAL | Status: DC

## 2016-01-14 NOTE — Progress Notes (Signed)
Tammy Kennedy    [id number]    [birth year]  Primary Care Physician:Thomas Ronnald Ramp, MD  Referring Physician: Janith Lima, MD [address], [place] [address]  Chief complaint: Crohn's Disease HPI: 60 year old female with long history of Crohn's disease, rectovaginal fistula in the past was on maintenance with Imuran 50 mg until 2015, she was lost to follow-up for 3 years and present today at last month with Crohn's flare, status post colonoscopy on 12/06/2015 that showed ulcerations of the terminal ileum with inability to intubate the ileum with probable stricture, there was severe ulceration in the rectum from the anal verge to 15 cm then sparing to 40 cm. Above 40 cm there were aphthous ulcers and mild to moderate colitis. At the time she was having about 10-15 bowel movements a day. She was started on Cipro Flagyl and prednisone taper. She reports improvement in her symptoms in the past month, is currently down to 10 mg of prednisone. She is also using Canasa suppository at bedtime. She completed the course of Cipro and Flagyl for 2 weeks. She is currently having 3-4 bowel movements a day and wakes up about 3 or 4 times at night during a week , she feels better overall . Patient is reluctant to start Biologics. Her weight has improved slightly.   Outpatient Encounter Prescriptions as of 01/14/2016  Medication Sig  . azaTHIOprine (IMURAN) 50 MG tablet Take 2 tablets (100 mg total) by mouth daily.  . calcium carbonate (OS-CAL - DOSED IN MG OF ELEMENTAL CALCIUM) 1250 (500 CA) MG tablet Take 1 tablet by mouth.  . fenofibrate (TRICOR) 145 MG tablet Take 1 tablet (145 mg total) by mouth daily.  . folic acid (FOLVITE) 1 MG tablet Take 1 tablet (1 mg total) by mouth daily.  Marland Kitchen levothyroxine (SYNTHROID, LEVOTHROID) 88 MCG tablet Take 1 tablet (88 mcg total) by mouth daily. TSH lab to be completed around the end of October.  . mesalamine (CANASA) 1000 MG suppository  Place 1 suppository (1,000 mg total) rectally at bedtime.  Marland Kitchen oxyCODONE-acetaminophen (PERCOCET/ROXICET) 5-325 MG tablet Take 1 tab every 4-6 hours as needed for pain.  Marland Kitchen Potassium Gluconate 550 MG TABS Take 2 tablets by mouth daily.   . predniSONE (DELTASONE) 10 MG tablet Take as directed, start with 40 mg every am x 14 days, then take 30 mg daily x 7 days, then 20 mg daily x 7 days, then 10 mg x 7 days , then 5 mg x 7 days.  . Sennosides (EX-LAX PO) Take by mouth as directed.  . valACYclovir (VALTREX) 500 MG tablet TAKE 1 TABLET(500 MG) BY MOUTH DAILY  . venlafaxine XR (EFFEXOR-XR) 150 MG 24 hr capsule Take 1 capsule (150 mg total) by mouth daily with breakfast.  . vitamin B-12 (CYANOCOBALAMIN) 500 MCG tablet Take 500 mcg by mouth daily.    . [DISCONTINUED] azaTHIOprine (IMURAN) 50 MG tablet Take 1 tablet (50 mg total) by mouth daily.  . [DISCONTINUED] Budesonide (UCERIS) 9 MG TB24 Take 9 mg by mouth daily.  . [DISCONTINUED] ciprofloxacin (CIPRO) 500 MG tablet Take 1 tablet (500 mg total) by mouth 2 (two) times daily.  . [DISCONTINUED] dicyclomine (BENTYL) 10 MG capsule Take 1 capsule (10 mg total) by mouth 3 (three) times daily before meals.  . [DISCONTINUED] LORazepam (ATIVAN) 0.5 MG tablet Take 0.5 mg by mouth daily as needed.    . [DISCONTINUED] magic mouthwash w/lidocaine SOLN Take  5 mLs by mouth 4 (four) times daily.  . [DISCONTINUED] mesalamine (ROWASA) 4 g enema INSERT 1 ENEMA RECTALLY AT BEDTIME  . [DISCONTINUED] metroNIDAZOLE (FLAGYL) 500 MG tablet Take 1 tablet (500 mg total) by mouth 2 (two) times daily. Add to you existing RX and begin taking twice a day. This will make a total of 4 weeks.  . [DISCONTINUED] valACYclovir (VALTREX) 500 MG tablet Take 1 tablet (500 mg total) by mouth daily as needed.   No facility-administered encounter medications on file as of 01/14/2016.    Allergies as of 01/14/2016  . (No Known Allergies)    Past Medical History  Diagnosis Date  .  HYPOTHYROIDISM   . HYPERLIPIDEMIA     intol of statin trials  . ANEMIA   . ANXIETY DISORDER   . DEPRESSION   . CROHN'S DISEASE   . OSTEOPENIA   . ALCOHOL ABUSE, HX OF   . HEPATOMEGALY, HX OF   . DRY EYE SYNDROME   . Tubular adenoma 2013  . C. difficile colitis   . Rectovaginal fistula 2012    chron's disease    Past Surgical History  Procedure Laterality Date  . Anal fissure repair    . Hemorrhoid surgery    . Theraputic abortion    . Mohs surgery  2008    Nose BCC  . Left thumb surgery  1989    Family History  Problem Relation Age of Onset  . Adopted: Yes  . Alzheimer's disease Mother 69    Social History   Social History  . Marital Status: Divorced    Spouse Name: N/A  . Number of Children: N/A  . Years of Education: N/A   Occupational History  . Not on file.   Social History Main Topics  . Smoking status: Never Smoker   . Smokeless tobacco: Never Used  . Alcohol Use: 12.0 oz/week    20 Glasses of wine per week  . Drug Use: No  . Sexual Activity: Not on file   Other Topics Concern  . Not on file   Social History Narrative   Works at [employer] [place]- [employer]- part time      Review of systems: Review of Systems  Constitutional: Negative for fever and chills.  HENT: Negative.   Eyes: Negative for blurred vision.  Respiratory: Negative for cough, shortness of breath and wheezing.   Cardiovascular: Negative for chest pain and palpitations.  Gastrointestinal: as per HPI Genitourinary: Negative for dysuria, urgency, frequency and hematuria.  Musculoskeletal: Negative for myalgias, back pain and joint pain.  Skin: Negative for itching and rash.  Neurological: Negative for dizziness, tremors, focal weakness, seizures and loss of consciousness.  Endo/Heme/Allergies: Negative for environmental allergies.  Psychiatric/Behavioral: Negative for depression, suicidal ideas and hallucinations.  All other systems reviewed and are  negative.   Physical Exam: Filed Vitals:   01/14/16 0831  BP: 100/70  Pulse: 80   Gen:      No acute distress HEENT:  EOMI, sclera anicteric Neck:     No masses; no thyromegaly Lungs:    Clear to auscultation bilaterally; normal respiratory effort CV:         Regular rate and rhythm; no murmurs Abd:      + bowel sounds; soft, non-tender; no palpable masses, no distension Ext:    No edema; adequate peripheral perfusion Skin:      Warm and dry; no rash Neuro: alert and oriented x 3 Psych: normal mood and affect  Data  Reviewed:  Reviewed past relevant GI work up   Assessment and Plan/Recommendations:  60 year old female with Crohn's disease with ileocolonic involvement here for follow-up visit Continue prednisone taper which will be completed in next 2 weeks. Increase Imuran 100 mg daily, she is currently only taking 50 mg (22m/Kg) Patient is reluctant to start biologic and wants to give it a try and if doesn't improve then will consider switching to a biologic Check CBC,CMP and ESR/CRP to monitor disease activity Return in 1 month and will plan on rechecking ESR/CRP to trend       K. VDenzil Magnuson, MD [phone]-Fri 8a-5p [phone] 5p, weekends, holidays

## 2016-01-14 NOTE — Patient Instructions (Addendum)
Go to the basement for labs Increase Imuran to 135m daily Follow up in 3 months with Dr NSilverio DecampFollow up with our PA Amy ELa Prairiein a month, There are no available appointments at this time. I will call you to remind you to schedule once her schedule is out

## 2016-01-17 ENCOUNTER — Other Ambulatory Visit: Payer: Self-pay

## 2016-01-17 MED ORDER — AZATHIOPRINE 50 MG PO TABS: 100.0000 mg | ORAL_TABLET | Freq: Every day | ORAL | Status: DC

## 2016-02-12 ENCOUNTER — Encounter: Payer: Self-pay | Admitting: Gastroenterology

## 2016-04-22 ENCOUNTER — Other Ambulatory Visit: Payer: Self-pay | Admitting: Internal Medicine

## 2016-04-22 ENCOUNTER — Other Ambulatory Visit (INDEPENDENT_AMBULATORY_CARE_PROVIDER_SITE_OTHER): Payer: BLUE CROSS/BLUE SHIELD

## 2016-04-22 ENCOUNTER — Ambulatory Visit (INDEPENDENT_AMBULATORY_CARE_PROVIDER_SITE_OTHER): Payer: BLUE CROSS/BLUE SHIELD | Admitting: Gastroenterology

## 2016-04-22 ENCOUNTER — Encounter: Payer: Self-pay | Admitting: Gastroenterology

## 2016-04-22 VITALS — BP 90/70 | HR 80 | Ht 59.5 in | Wt 105.0 lb

## 2016-04-22 DIAGNOSIS — D702 Other drug-induced agranulocytosis: Secondary | ICD-10-CM

## 2016-04-22 DIAGNOSIS — E038 Other specified hypothyroidism: Secondary | ICD-10-CM

## 2016-04-22 DIAGNOSIS — K5090 Crohn's disease, unspecified, without complications: Secondary | ICD-10-CM

## 2016-04-22 DIAGNOSIS — R197 Diarrhea, unspecified: Secondary | ICD-10-CM

## 2016-04-22 LAB — COMPREHENSIVE METABOLIC PANEL
ALBUMIN: 4.4 g/dL (ref 3.5–5.2)
ALT: 7 U/L (ref 0–35)
AST: 18 U/L (ref 0–37)
Alkaline Phosphatase: 39 U/L (ref 39–117)
BUN: 13 mg/dL (ref 6–23)
CALCIUM: 9.8 mg/dL (ref 8.4–10.5)
CHLORIDE: 106 meq/L (ref 96–112)
CO2: 28 mEq/L (ref 19–32)
Creatinine, Ser: 0.68 mg/dL (ref 0.40–1.20)
GFR: 93.84 mL/min (ref >60.00)
Glucose, Bld: 75 mg/dL (ref 70–99)
POTASSIUM: 4.3 meq/L (ref 3.5–5.1)
Sodium: 142 mEq/L (ref 135–145)
Total Bilirubin: 0.5 mg/dL (ref 0.2–1.2)
Total Protein: 7.3 g/dL (ref 6.0–8.3)

## 2016-04-22 LAB — CBC WITH DIFFERENTIAL/PLATELET
BASOS PCT: 1 % (ref 0.0–3.0)
Basophils Absolute: 0 10*3/uL (ref 0.0–0.1)
EOS PCT: 5.3 % — AB (ref 0.0–5.0)
Eosinophils Absolute: 0.1 10*3/uL (ref 0.0–0.7)
HEMATOCRIT: 35.3 % — AB (ref 36.0–46.0)
HEMOGLOBIN: 12.1 g/dL (ref 12.0–15.0)
LYMPHS PCT: 43.7 % (ref 12.0–46.0)
Lymphs Abs: 0.9 10*3/uL (ref 0.7–4.0)
MCHC: 34.3 g/dL (ref 30.0–36.0)
MCV: 104.6 fl — ABNORMAL HIGH (ref 78.0–100.0)
Monocytes Absolute: 0.1 10*3/uL (ref 0.1–1.0)
Monocytes Relative: 5.5 % (ref 3.0–12.0)
NEUTROS ABS: 0.9 10*3/uL — AB (ref 1.4–7.7)
Neutrophils Relative %: 44.5 % (ref 43.0–77.0)
Platelets: 390 10*3/uL (ref 150.0–400.0)
RBC: 3.38 Mil/uL — ABNORMAL LOW (ref 3.87–5.11)
RDW: 14.8 % (ref 11.5–15.5)

## 2016-04-22 LAB — SEDIMENTATION RATE: SED RATE: 4 mm/h (ref 0–30)

## 2016-04-22 LAB — TSH: TSH: 0.06 u[IU]/mL — ABNORMAL LOW (ref 0.35–4.50)

## 2016-04-22 MED ORDER — AZATHIOPRINE 50 MG PO TABS: 100.0000 mg | ORAL_TABLET | Freq: Every day | ORAL | Status: DC

## 2016-04-22 NOTE — Patient Instructions (Signed)
Go to the basement for labs today  Recall colonoscopy is 2018  Follow up in 1 year

## 2016-04-22 NOTE — Progress Notes (Signed)
Tammy Kennedy    [id number]    08/19/1956  Primary Care Physician:Stacy Lorretta Harp, MD  Referring Physician: Janith Lima, MD [address] [place], [place] [address]  Chief complaint:  Crohn's disease   HPI: 60 year old female with long standing history of Crohn's disease, rectovaginal fistula in the past was on maintenance with Imuran 50 mg until 2015, she was lost to follow-up for 3 years and presented in February 2017 with Crohn's flare, on colonoscopy on 12/06/2015 noted to have ulcerations of the terminal ileum with inability to intubate the ileum ?probable stricture, there was severe ulceration in the rectum from the anal verge to 15 cm then sparing to 40 cm. Above 40 cm there were aphthous ulcers and mild to moderate colitis. At the time she was having about 10-15 bowel movements a day. She was started on Cipro Flagyl and prednisone taper. She will also C. difficile toxin positive and was treated for it with prolonged course of Flagyl .  Imuran dose was increased from 50 mg to 100 mg at last visit in April 2017 and was tapered off prednisone. She reports significant improvement in her symptoms and she feels she is back to her baseline. Denies any nocturnal symptoms, is having 3-4 bowel movements a day. Most of her bowel movements have been in the morning, he likes to drink milk and drinks about 3 glasses of milk in the morning. Denies any blood in stool, nausea, vomiting, abdominal pain or melena.   Outpatient Encounter Prescriptions as of 04/22/2016  Medication Sig  . azaTHIOprine (IMURAN) 50 MG tablet Take 2 tablets (100 mg total) by mouth daily.  . calcium-vitamin D (OSCAL WITH D) 500-200 MG-UNIT tablet Take 2 tablets by mouth daily.  . fenofibrate (TRICOR) 145 MG tablet Take 1 tablet (145 mg total) by mouth daily.  . folic acid (FOLVITE) 1 MG tablet Take 2 mg by mouth daily.  Marland Kitchen levothyroxine (SYNTHROID, LEVOTHROID) 88 MCG tablet Take 1 tablet (88 mcg  total) by mouth daily before breakfast.  . mesalamine (LIALDA) 1.2 g EC tablet Take 2.4 g by mouth 2 (two) times daily.  . Potassium Gluconate 550 MG TABS Take 2 tablets by mouth daily.   . Sennosides (EX-LAX PO) Take by mouth as directed.  . valACYclovir (VALTREX) 500 MG tablet TAKE 1 TABLET(500 MG) BY MOUTH DAILY  . venlafaxine XR (EFFEXOR-XR) 150 MG 24 hr capsule TAKE 1 CAPSULE BY MOUTH DAILY WITH BREAKFAST  . vitamin B-12 (CYANOCOBALAMIN) 500 MCG tablet Take 500 mcg by mouth daily.    . [DISCONTINUED] azaTHIOprine (IMURAN) 50 MG tablet Take 2 tablets (100 mg total) by mouth daily.  . [DISCONTINUED] calcium carbonate (OS-CAL - DOSED IN MG OF ELEMENTAL CALCIUM) 1250 (500 CA) MG tablet Take 1 tablet by mouth.  . [DISCONTINUED] folic acid (FOLVITE) 1 MG tablet Take 1 tablet (1 mg total) by mouth daily. (Patient taking differently: Take 2 mg by mouth daily. )  . [DISCONTINUED] mesalamine (CANASA) 1000 MG suppository Place 1 suppository (1,000 mg total) rectally at bedtime.  . [DISCONTINUED] oxyCODONE-acetaminophen (PERCOCET/ROXICET) 5-325 MG tablet Take 1 tab every 4-6 hours as needed for pain.  . [DISCONTINUED] predniSONE (DELTASONE) 10 MG tablet Take as directed, start with 40 mg every am x 14 days, then take 30 mg daily x 7 days, then 20 mg daily x 7 days, then 10 mg x 7 days , then 5 mg x 7 days.  No facility-administered encounter medications on file as of 04/22/2016.    Allergies as of 04/22/2016  . (No Known Allergies)    Past Medical History  Diagnosis Date  . HYPOTHYROIDISM   . HYPERLIPIDEMIA     intol of statin trials  . ANEMIA   . ANXIETY DISORDER   . DEPRESSION   . CROHN'S DISEASE   . OSTEOPENIA   . ALCOHOL ABUSE, HX OF   . HEPATOMEGALY, HX OF   . DRY EYE SYNDROME   . Tubular adenoma 2013  . C. difficile colitis   . Rectovaginal fistula 2012    chron's disease    Past Surgical History  Procedure Laterality Date  . Anal fissure repair    . Hemorrhoid surgery    .  Theraputic abortion    . Mohs surgery  2008    Nose BCC  . Left thumb surgery  1989    Family History  Problem Relation Age of Onset  . Adopted: Yes  . Alzheimer's disease Mother 7    Social History   Social History  . Marital Status: Divorced    Spouse Name: N/A  . Number of Children: N/A  . Years of Education: N/A   Occupational History  . Not on file.   Social History Main Topics  . Smoking status: Never Smoker   . Smokeless tobacco: Never Used  . Alcohol Use: 12.0 oz/week    20 Glasses of wine per week  . Drug Use: No  . Sexual Activity: Not on file   Other Topics Concern  . Not on file   Social History Narrative   Works at [employer] [place]- [employer]- part time      Review of systems: Review of Systems  Constitutional: Negative for fever and chills.  HENT: Negative.   Eyes: Negative for blurred vision.  Respiratory: Negative for cough, shortness of breath and wheezing.   Cardiovascular: Negative for chest pain and palpitations.  Gastrointestinal: as per HPI Genitourinary: Negative for dysuria, urgency, frequency and hematuria.  Musculoskeletal: Negative for myalgias, back pain and joint pain.  Skin: Negative for itching and rash.  Neurological: Negative for dizziness, tremors, focal weakness, seizures and loss of consciousness.  Endo/Heme/Allergies: Positive for environmental allergies and easy bruising.  Psychiatric/Behavioral:  Positive for depression, Negative for suicidal ideas and hallucinations.  All other systems reviewed and are negative.   Physical Exam: Filed Vitals:   04/22/16 1038  BP: 90/70  Pulse: 80   Gen:      No acute distress HEENT:  EOMI, sclera anicteric Neck:     No masses; no thyromegaly Lungs:    Clear to auscultation bilaterally; normal respiratory effort CV:         Regular rate and rhythm; no murmurs Abd:      + bowel sounds; soft, non-tender; no palpable masses, no distension Ext:    No edema;  adequate peripheral perfusion Skin:      Warm and dry; no rash Neuro: alert and oriented x 3 Psych: normal mood and affect  Data Reviewed:  Reviewed labs and diagnostic studies in epic   Assessment and Plan/Recommendations:  60 year old female with long history of Crohn's disease on Imuran 100 mg daily, appears to be in clinical remission here for follow-up visit We'll check CBC, LFT, BMP and ESR to monitor disease activity and potential drug toxicity with Imuran Continue multivitamins, A56 and folic acid Due for recall colonoscopy in 2018 for surveillance Advised patient to substitute milk with lactose-free  alternatives for a week and see if her bowel frequency improves as she may be developing lactose intolerance or the amount she drinks is beyond the threshold for her.  Return in 1 year or sooner if needed  K. Denzil Magnuson , MD [phone] Mon-Fri 8a-5p [phone] after 5p, weekends, holidays  CC: Janith Lima, MD

## 2016-04-23 ENCOUNTER — Other Ambulatory Visit: Payer: Self-pay | Admitting: Emergency Medicine

## 2016-04-23 MED ORDER — LEVOTHYROXINE SODIUM 75 MCG PO TABS: 75.0000 ug | ORAL_TABLET | Freq: Every day | ORAL | Status: DC

## 2016-04-29 ENCOUNTER — Telehealth: Payer: Self-pay | Admitting: Gastroenterology

## 2016-04-29 NOTE — Telephone Encounter (Signed)
Please see the result note. Called back to the patient. Left a voicemail requesting a return call.

## 2016-05-08 ENCOUNTER — Other Ambulatory Visit: Payer: Self-pay | Admitting: Internal Medicine

## 2016-05-14 ENCOUNTER — Ambulatory Visit (INDEPENDENT_AMBULATORY_CARE_PROVIDER_SITE_OTHER): Payer: BLUE CROSS/BLUE SHIELD | Admitting: Internal Medicine

## 2016-05-14 ENCOUNTER — Encounter: Payer: Self-pay | Admitting: Internal Medicine

## 2016-05-14 VITALS — BP 98/68 | HR 98 | Temp 98.9°F | Ht 59.5 in | Wt 108.4 lb

## 2016-05-14 DIAGNOSIS — F329 Major depressive disorder, single episode, unspecified: Secondary | ICD-10-CM

## 2016-05-14 DIAGNOSIS — Z1159 Encounter for screening for other viral diseases: Secondary | ICD-10-CM | POA: Diagnosis not present

## 2016-05-14 DIAGNOSIS — E039 Hypothyroidism, unspecified: Secondary | ICD-10-CM

## 2016-05-14 DIAGNOSIS — E785 Hyperlipidemia, unspecified: Secondary | ICD-10-CM | POA: Diagnosis not present

## 2016-05-14 DIAGNOSIS — M8580 Other specified disorders of bone density and structure, unspecified site: Secondary | ICD-10-CM

## 2016-05-14 DIAGNOSIS — F411 Generalized anxiety disorder: Secondary | ICD-10-CM

## 2016-05-14 DIAGNOSIS — F1011 Alcohol abuse, in remission: Secondary | ICD-10-CM | POA: Insufficient documentation

## 2016-05-14 DIAGNOSIS — K50919 Crohn's disease, unspecified, with unspecified complications: Secondary | ICD-10-CM | POA: Diagnosis not present

## 2016-05-14 DIAGNOSIS — F32A Depression, unspecified: Secondary | ICD-10-CM

## 2016-05-14 DIAGNOSIS — M4120 Other idiopathic scoliosis, site unspecified: Secondary | ICD-10-CM

## 2016-05-14 DIAGNOSIS — F1010 Alcohol abuse, uncomplicated: Secondary | ICD-10-CM

## 2016-05-14 HISTORY — DX: Alcohol abuse, uncomplicated: F10.10

## 2016-05-14 MED ORDER — VENLAFAXINE HCL ER 75 MG PO CP24: 75.0000 mg | ORAL_CAPSULE | Freq: Every day | ORAL | 5 refills | Status: DC

## 2016-05-14 MED ORDER — FENOFIBRATE 145 MG PO TABS: 145.0000 mg | ORAL_TABLET | Freq: Every day | ORAL | 3 refills | Status: DC

## 2016-05-14 MED ORDER — AZATHIOPRINE 50 MG PO TABS: 150.0000 mg | ORAL_TABLET | Freq: Every day | ORAL | 6 refills | Status: DC

## 2016-05-14 NOTE — Assessment & Plan Note (Signed)
She currently feels her anxiety is controlled Effexor being increased for uncontrolled depression

## 2016-05-14 NOTE — Progress Notes (Signed)
Pre visit review using our clinic review tool, if applicable. No additional management support is needed unless otherwise documented below in the visit note. 

## 2016-05-14 NOTE — Patient Instructions (Addendum)
  Test(s) ordered today. Have your blood work done as we discussed.  All other Health Maintenance issues reviewed.   All recommended immunizations and age-appropriate screenings are up-to-date or discussed.  No immunizations administered today.   Medications reviewed and updated.  Changes include increasing the effexor to 225 mg daily.   Your prescription(s) have been submitted to your pharmacy. Please take as directed and contact our office if you believe you are having problem(s) with the medication(s).    Please followup in 6 months for a physical

## 2016-05-14 NOTE — Assessment & Plan Note (Signed)
Intolerant of statins On fenofibrate now

## 2016-05-14 NOTE — Assessment & Plan Note (Signed)
Has decreased her alcohol intake, but still drinks excessively at times - some nights having 4-5 glasses of wine She declined support at this time - has been through rehab in the past

## 2016-05-14 NOTE — Assessment & Plan Note (Signed)
Not currently controlled Increase Effexor to 225 mg daily Call if no improvement

## 2016-05-14 NOTE — Assessment & Plan Note (Signed)
DEXA up-to-date Taking calcium Encouraged regular exercise

## 2016-05-14 NOTE — Assessment & Plan Note (Addendum)
Symptoms currently controlled on her medication Following with GI Was last follow-up for 3 years, recent flare December 2017-symptoms controlled since starting back on medication Last colonoscopy 12/06/15

## 2016-05-14 NOTE — Assessment & Plan Note (Addendum)
Dose adjusted a few weeks ago Check tsh in a few weeks Titrate med dose if needed

## 2016-05-14 NOTE — Progress Notes (Signed)
Subjective:    Patient ID: Tammy Kennedy, female    DOB: [birth year], 60 y.o.   MRN: [id number]  HPI She is here to establish with a new pcp.  She is here for follow up.  Hypothyroidism:  She is taking her medication daily.  She does feel tired and is unsure if it is related to her depression, her thyroid or age.  We just recently adjusted her dose and is planning on having her blood work rechecked later this month.  Depression:  She is taking the medication daily.  She does feel depressed at times.  She has decreased motivation.  She denies anxiety at this time.    Crohn's:  She is following with GI.  She had a flare of her Crohn's earlier this year.  She has history rectovaginal fistula. Her last colonoscopy was 12/06/15 and had ulcerations and probable stricture. She had ulcerations and mild-moderate colitis. She was lost to follow-up and had not been on medication. She has been taking her medication consistently since then. Denies current symptoms, except for her chronic diarrhea.  Osteopenia:  No regular exercise.  She is active.  She does take calcium.   Medications and allergies reviewed with patient and updated if appropriate.  Patient Active Problem List   Diagnosis Date Noted  . Rectovaginal fistula   . C. difficile colitis   . Abnormal LFTs   . Regional enteritis ([facility]) 05/27/2009  . HEPATOMEGALY, HX OF 08/31/2008  . Hypothyroidism 07/30/2008  . Hyperlipidemia 07/30/2008  . ANEMIA 07/30/2008  . ANXIETY DISORDER 07/30/2008  . DEPRESSION 07/30/2008  . RENAL CYST 07/30/2008  . DEGENERATIVE DISC DISEASE 07/30/2008  . Osteopenia 07/30/2008  . ALCOHOL ABUSE, HX OF 07/30/2008    Current Outpatient Prescriptions on File Prior to Visit  Medication Sig Dispense Refill  . azaTHIOprine (IMURAN) 50 MG tablet Take 2 tablets (100 mg total) by mouth daily. (Patient taking differently: Take 50 mg by mouth 3 (three) times daily. ) 60 tablet 6  . calcium-vitamin D (OSCAL WITH D)  500-200 MG-UNIT tablet Take 2 tablets by mouth daily.    . fenofibrate (TRICOR) 145 MG tablet Take 1 tablet (145 mg total) by mouth daily. Overdue for yearly physical w/labs must see MD for refills 30 tablet 0  . folic acid (FOLVITE) 1 MG tablet Take 2 mg by mouth daily.    Marland Kitchen levothyroxine (SYNTHROID, LEVOTHROID) 75 MCG tablet Take 1 tablet (75 mcg total) by mouth daily. -- Repeat labs in 2 months 90 tablet 0  . mesalamine (LIALDA) 1.2 g EC tablet Take 2.4 g by mouth 2 (two) times daily.    . Potassium Gluconate 550 MG TABS Take 2 tablets by mouth daily.     . Sennosides (EX-LAX PO) Take by mouth as directed.    . valACYclovir (VALTREX) 500 MG tablet TAKE 1 TABLET(500 MG) BY MOUTH DAILY 30 tablet 5  . venlafaxine XR (EFFEXOR-XR) 150 MG 24 hr capsule TAKE 1 CAPSULE BY MOUTH DAILY WITH BREAKFAST 30 capsule 0  . vitamin B-12 (CYANOCOBALAMIN) 500 MCG tablet Take 500 mcg by mouth daily.       No current facility-administered medications on file prior to visit.     Past Medical History:  Diagnosis Date  . ALCOHOL ABUSE, HX OF   . ANEMIA   . ANXIETY DISORDER   . C. difficile colitis   . CROHN'S DISEASE   . DEPRESSION   . DRY EYE SYNDROME   . HEPATOMEGALY, HX OF   .  HYPERLIPIDEMIA    intol of statin trials  . HYPOTHYROIDISM   . OSTEOPENIA   . Rectovaginal fistula 2012   chron's disease  . Tubular adenoma 2013    Past Surgical History:  Procedure Laterality Date  . ANAL FISSURE REPAIR    . HEMORRHOID SURGERY    . Left thumb surgery  1989  . MOHS SURGERY  2008   Nose BCC  . Theraputic abortion      Social History   Social History  . Marital status: Divorced    Spouse name: N/A  . Number of children: N/A  . Years of education: N/A   Social History Main Topics  . Smoking status: Never Smoker  . Smokeless tobacco: Never Used  . Alcohol use 12.0 oz/week    20 Glasses of wine per week  . Drug use: No  . Sexual activity: Not Asked   Other Topics Concern  . None   Social  History Narrative   Works at [employer]- [employer]- part time    Family History  Problem Relation Age of Onset  . Adopted: Yes  . Alzheimer's disease Mother 81    Review of Systems  Constitutional: Positive for fatigue. Negative for appetite change, chills, fever and unexpected weight change.  Respiratory: Negative for cough, shortness of breath and wheezing.   Cardiovascular: Negative for chest pain, palpitations and leg swelling.  Gastrointestinal: Positive for diarrhea. Negative for abdominal pain, blood in stool and nausea.       No gerd  Genitourinary: Negative for dysuria and hematuria.  Neurological: Positive for light-headedness. Negative for headaches.       Objective:   Vitals:   05/14/16 1111  BP: 98/68  Pulse: 98  Temp: 98.9 F (37.2 C)   Filed Weights   05/14/16 1111  Weight: 108 lb 6 oz (49.2 kg)   Body mass index is 21.52 kg/m.   Physical Exam Constitutional: Appears well-developed and well-nourished. No distress.  HENT:  Head: Normocephalic and atraumatic.  Neck: Neck supple. No tracheal deviation present. No thyromegaly present.  Cardiovascular: Normal rate, regular rhythm and normal heart sounds.   No murmur heard. No carotid bruit  Pulmonary/Chest: Effort normal and breath sounds normal. No respiratory distress. No has no wheezes. No rales.  Abdomen: soft, nontender Musculoskeletal: No edema. scoliosis on exam Lymphadenopathy: No cervical adenopathy.  Skin: Skin is warm and dry. Not diaphoretic.  Psychiatric: Normal mood and affect. Behavior is normal.        Assessment & Plan:   See Problem List for Assessment and Plan of chronic medical problems.

## 2016-05-15 DIAGNOSIS — H5212 Myopia, left eye: Secondary | ICD-10-CM | POA: Diagnosis not present

## 2016-05-22 ENCOUNTER — Other Ambulatory Visit: Payer: Self-pay | Admitting: Internal Medicine

## 2016-06-06 ENCOUNTER — Other Ambulatory Visit: Payer: Self-pay | Admitting: Internal Medicine

## 2016-06-12 ENCOUNTER — Other Ambulatory Visit: Payer: Self-pay | Admitting: Internal Medicine

## 2016-06-20 ENCOUNTER — Other Ambulatory Visit: Payer: Self-pay | Admitting: Internal Medicine

## 2016-07-21 ENCOUNTER — Other Ambulatory Visit: Payer: Self-pay | Admitting: Internal Medicine

## 2016-07-23 ENCOUNTER — Other Ambulatory Visit: Payer: Self-pay | Admitting: Internal Medicine

## 2016-08-04 ENCOUNTER — Telehealth: Payer: Self-pay | Admitting: Gastroenterology

## 2016-08-05 MED ORDER — AZATHIOPRINE 50 MG PO TABS: 150.0000 mg | ORAL_TABLET | Freq: Every day | ORAL | 6 refills | Status: DC

## 2016-08-05 NOTE — Telephone Encounter (Signed)
Med sent to pharmacy electronically as requested by patient

## 2016-08-31 ENCOUNTER — Other Ambulatory Visit: Payer: Self-pay | Admitting: Internal Medicine

## 2016-09-26 ENCOUNTER — Other Ambulatory Visit: Payer: Self-pay | Admitting: Internal Medicine

## 2016-10-09 ENCOUNTER — Telehealth: Payer: Self-pay | Admitting: Gastroenterology

## 2016-10-09 NOTE — Telephone Encounter (Signed)
Called in new script to pharmacy for patient, sent in Azathioprine 3 tablets by mouth once daily with 3 refills  Patient aware med was sent

## 2016-10-20 ENCOUNTER — Other Ambulatory Visit: Payer: Self-pay | Admitting: Internal Medicine

## 2016-11-03 ENCOUNTER — Encounter: Payer: Self-pay | Admitting: Gastroenterology

## 2016-11-05 ENCOUNTER — Other Ambulatory Visit: Payer: Self-pay | Admitting: Internal Medicine

## 2016-11-13 ENCOUNTER — Other Ambulatory Visit: Payer: Self-pay | Admitting: Internal Medicine

## 2016-11-16 ENCOUNTER — Encounter: Payer: Self-pay | Admitting: Internal Medicine

## 2016-11-16 ENCOUNTER — Ambulatory Visit (INDEPENDENT_AMBULATORY_CARE_PROVIDER_SITE_OTHER): Payer: BLUE CROSS/BLUE SHIELD | Admitting: Internal Medicine

## 2016-11-16 ENCOUNTER — Other Ambulatory Visit (INDEPENDENT_AMBULATORY_CARE_PROVIDER_SITE_OTHER): Payer: BLUE CROSS/BLUE SHIELD

## 2016-11-16 VITALS — BP 104/72 | HR 104 | Temp 98.4°F | Resp 16 | Ht 60.0 in | Wt 111.0 lb

## 2016-11-16 DIAGNOSIS — Z0000 Encounter for general adult medical examination without abnormal findings: Secondary | ICD-10-CM

## 2016-11-16 DIAGNOSIS — E784 Other hyperlipidemia: Secondary | ICD-10-CM

## 2016-11-16 DIAGNOSIS — F1010 Alcohol abuse, uncomplicated: Secondary | ICD-10-CM

## 2016-11-16 DIAGNOSIS — E039 Hypothyroidism, unspecified: Secondary | ICD-10-CM | POA: Diagnosis not present

## 2016-11-16 DIAGNOSIS — E7849 Other hyperlipidemia: Secondary | ICD-10-CM

## 2016-11-16 DIAGNOSIS — F411 Generalized anxiety disorder: Secondary | ICD-10-CM

## 2016-11-16 DIAGNOSIS — Z1159 Encounter for screening for other viral diseases: Secondary | ICD-10-CM | POA: Diagnosis not present

## 2016-11-16 DIAGNOSIS — R7989 Other specified abnormal findings of blood chemistry: Secondary | ICD-10-CM

## 2016-11-16 DIAGNOSIS — K50919 Crohn's disease, unspecified, with unspecified complications: Secondary | ICD-10-CM

## 2016-11-16 DIAGNOSIS — F3289 Other specified depressive episodes: Secondary | ICD-10-CM | POA: Diagnosis not present

## 2016-11-16 LAB — LDL CHOLESTEROL, DIRECT: Direct LDL: 121 mg/dL

## 2016-11-16 LAB — COMPREHENSIVE METABOLIC PANEL
ALT: 8 U/L (ref 0–35)
AST: 15 U/L (ref 0–37)
Albumin: 4.5 g/dL (ref 3.5–5.2)
Alkaline Phosphatase: 46 U/L (ref 39–117)
BILIRUBIN TOTAL: 0.4 mg/dL (ref 0.2–1.2)
BUN: 23 mg/dL (ref 6–23)
CO2: 29 meq/L (ref 19–32)
CREATININE: 0.84 mg/dL (ref 0.40–1.20)
Calcium: 10.4 mg/dL (ref 8.4–10.5)
Chloride: 104 mEq/L (ref 96–112)
GFR: 73.39 mL/min (ref >60.00)
GLUCOSE: 101 mg/dL — AB (ref 70–99)
Potassium: 4.7 mEq/L (ref 3.5–5.1)
Sodium: 139 mEq/L (ref 135–145)
TOTAL PROTEIN: 7.6 g/dL (ref 6.0–8.3)

## 2016-11-16 LAB — IRON: Iron: 132 ug/dL (ref 42–145)

## 2016-11-16 LAB — LIPID PANEL
Cholesterol: 226 mg/dL — ABNORMAL HIGH (ref 0–200)
HDL: 71.1 mg/dL (ref >39.00)
NONHDL: 154.94
TRIGLYCERIDES: 226 mg/dL — AB (ref 0.0–149.0)
Total CHOL/HDL Ratio: 3
VLDL: 45.2 mg/dL — ABNORMAL HIGH (ref 0.0–40.0)

## 2016-11-16 LAB — CBC WITH DIFFERENTIAL/PLATELET
BASOS ABS: 0 10*3/uL (ref 0.0–0.1)
Basophils Relative: 1.2 % (ref 0.0–3.0)
EOS ABS: 0.1 10*3/uL (ref 0.0–0.7)
Eosinophils Relative: 2.5 % (ref 0.0–5.0)
HCT: 37.6 % (ref 36.0–46.0)
Hemoglobin: 12.7 g/dL (ref 12.0–15.0)
LYMPHS ABS: 0.6 10*3/uL — AB (ref 0.7–4.0)
Lymphocytes Relative: 25.2 % (ref 12.0–46.0)
MCHC: 33.8 g/dL (ref 30.0–36.0)
MCV: 110.4 fl — AB (ref 78.0–100.0)
MONO ABS: 0.2 10*3/uL (ref 0.1–1.0)
Monocytes Relative: 10.7 % (ref 3.0–12.0)
NEUTROS ABS: 1.4 10*3/uL (ref 1.4–7.7)
NEUTROS PCT: 60.4 % (ref 43.0–77.0)
PLATELETS: 452 10*3/uL — AB (ref 150.0–400.0)
RBC: 3.41 Mil/uL — ABNORMAL LOW (ref 3.87–5.11)
RDW: 14.3 % (ref 11.5–15.5)
WBC: 2.3 10*3/uL — ABNORMAL LOW (ref 4.0–10.5)

## 2016-11-16 LAB — FERRITIN: FERRITIN: 169.6 ng/mL (ref 10.0–291.0)

## 2016-11-16 LAB — HEPATITIS C ANTIBODY: HCV AB: NEGATIVE

## 2016-11-16 LAB — HEMOGLOBIN A1C: HEMOGLOBIN A1C: 5.6 % (ref 4.6–6.5)

## 2016-11-16 LAB — TSH: TSH: 0.18 u[IU]/mL — AB (ref 0.35–4.50)

## 2016-11-16 NOTE — Assessment & Plan Note (Signed)
Controlled, stable Continue current dose of medication  

## 2016-11-16 NOTE — Assessment & Plan Note (Signed)
Symptoms currently controlled Following with GI

## 2016-11-16 NOTE — Progress Notes (Signed)
Pre visit review using our clinic review tool, if applicable. No additional management support is needed unless otherwise documented below in the visit note. 

## 2016-11-16 NOTE — Assessment & Plan Note (Signed)
Intolerant of statins Check lipid panel  Continue daily fenofibrate Regular exercise and healthy diet encouraged

## 2016-11-16 NOTE — Patient Instructions (Addendum)
Test(s) ordered today. Your results will be released to [facility] (or called to you) after review, usually within 72hours after test completion. If any changes need to be made, you will be notified at that same time.  All other Health Maintenance issues reviewed.   All recommended immunizations and age-appropriate screenings are up-to-date or discussed.  No immunizations administered today.   Medications reviewed and updated.  No changes recommended at this time.  Your prescription(s) have been submitted to your pharmacy. Please take as directed and contact our office if you believe you are having problem(s) with the medication(s).   Please followup in 6 months    Health Maintenance, Female Introduction Adopting a healthy lifestyle and getting preventive care can go a long way to promote health and wellness. Talk with your health care provider about what schedule of regular examinations is right for you. This is a good chance for you to check in with your provider about disease prevention and staying healthy. In between checkups, there are plenty of things you can do on your own. Experts have done a lot of research about which lifestyle changes and preventive measures are most likely to keep you healthy. Ask your health care provider for more information. Weight and diet Eat a healthy diet  Be sure to include plenty of vegetables, fruits, low-fat dairy products, and lean protein.  Do not eat a lot of foods high in solid fats, added sugars, or salt.  Get regular exercise. This is one of the most important things you can do for your health.  Most adults should exercise for at least 150 minutes each week. The exercise should increase your heart rate and make you sweat (moderate-intensity exercise).  Most adults should also do strengthening exercises at least twice a week. This is in addition to the moderate-intensity exercise. Maintain a healthy weight  Body mass index (BMI) is a  measurement that can be used to identify possible weight problems. It estimates body fat based on height and weight. Your health care provider can help determine your BMI and help you achieve or maintain a healthy weight.  For females 62 years of age and older:  A BMI below 18.5 is considered underweight.  A BMI of 18.5 to 24.9 is normal.  A BMI of 25 to 29.9 is considered overweight.  A BMI of 30 and above is considered obese. Watch levels of cholesterol and blood lipids  You should start having your blood tested for lipids and cholesterol at 61 years of age, then have this test every 5 years.  You may need to have your cholesterol levels checked more often if:  Your lipid or cholesterol levels are high.  You are older than 61 years of age.  You are at high risk for heart disease. Cancer screening Lung Cancer  Lung cancer screening is recommended for adults 68-67 years old who are at high risk for lung cancer because of a history of smoking.  A yearly low-dose CT scan of the lungs is recommended for people who:  Currently smoke.  Have quit within the past 15 years.  Have at least a 30-pack-year history of smoking. A pack year is smoking an average of one pack of cigarettes a day for 1 year.  Yearly screening should continue until it has been 15 years since you quit.  Yearly screening should stop if you develop a health problem that would prevent you from having lung cancer treatment. Breast Cancer  Practice breast self-awareness. This means  understanding how your breasts normally appear and feel.  It also means doing regular breast self-exams. Let your health care provider know about any changes, no matter how small.  If you are in your 20s or 30s, you should have a clinical breast exam (CBE) by a health care provider every 1-3 years as part of a regular health exam.  If you are 52 or older, have a CBE every year. Also consider having a breast X-ray (mammogram) every  year.  If you have a family history of breast cancer, talk to your health care provider about genetic screening.  If you are at high risk for breast cancer, talk to your health care provider about having an MRI and a mammogram every year.  Breast cancer gene (BRCA) assessment is recommended for women who have family members with BRCA-related cancers. BRCA-related cancers include:  Breast.  Ovarian.  Tubal.  Peritoneal cancers.  Results of the assessment will determine the need for genetic counseling and BRCA1 and BRCA2 testing. Cervical Cancer  Your health care provider may recommend that you be screened regularly for cancer of the pelvic organs (ovaries, uterus, and vagina). This screening involves a pelvic examination, including checking for microscopic changes to the surface of your cervix (Pap test). You may be encouraged to have this screening done every 3 years, beginning at age 48.  For women ages 8-65, health care providers may recommend pelvic exams and Pap testing every 3 years, or they may recommend the Pap and pelvic exam, combined with testing for human papilloma virus (HPV), every 5 years. Some types of HPV increase your risk of cervical cancer. Testing for HPV may also be done on women of any age with unclear Pap test results.  Other health care providers may not recommend any screening for nonpregnant women who are considered low risk for pelvic cancer and who do not have symptoms. Ask your health care provider if a screening pelvic exam is right for you.  If you have had past treatment for cervical cancer or a condition that could lead to cancer, you need Pap tests and screening for cancer for at least 20 years after your treatment. If Pap tests have been discontinued, your risk factors (such as having a new sexual partner) need to be reassessed to determine if screening should resume. Some women have medical problems that increase the chance of getting cervical cancer. In  these cases, your health care provider may recommend more frequent screening and Pap tests. Colorectal Cancer  This type of cancer can be detected and often prevented.  Routine colorectal cancer screening usually begins at 61 years of age and continues through 61 years of age.  Your health care provider may recommend screening at an earlier age if you have risk factors for colon cancer.  Your health care provider may also recommend using home test kits to check for hidden blood in the stool.  A small camera at the end of a tube can be used to examine your colon directly (sigmoidoscopy or colonoscopy). This is done to check for the earliest forms of colorectal cancer.  Routine screening usually begins at age 26.  Direct examination of the colon should be repeated every 5-10 years through 61 years of age. However, you may need to be screened more often if early forms of precancerous polyps or small growths are found. Skin Cancer  Check your skin from head to toe regularly.  Tell your health care provider about any new moles or changes  in moles, especially if there is a change in a mole's shape or color.  Also tell your health care provider if you have a mole that is larger than the size of a pencil eraser.  Always use sunscreen. Apply sunscreen liberally and repeatedly throughout the day.  Protect yourself by wearing long sleeves, pants, a wide-brimmed hat, and sunglasses whenever you are outside. Heart disease, diabetes, and high blood pressure  High blood pressure causes heart disease and increases the risk of stroke. High blood pressure is more likely to develop in:  People who have blood pressure in the high end of the normal range (130-139/85-89 mm Hg).  People who are overweight or obese.  People who are African American.  If you are 58-88 years of age, have your blood pressure checked every 3-5 years. If you are 74 years of age or older, have your blood pressure checked  every year. You should have your blood pressure measured twice-once when you are at a hospital or clinic, and once when you are not at a hospital or clinic. Record the average of the two measurements. To check your blood pressure when you are not at a hospital or clinic, you can use:  An automated blood pressure machine at a pharmacy.  A home blood pressure monitor.  If you are between 86 years and 36 years old, ask your health care provider if you should take aspirin to prevent strokes.  Have regular diabetes screenings. This involves taking a blood sample to check your fasting blood sugar level.  If you are at a normal weight and have a low risk for diabetes, have this test once every three years after 61 years of age.  If you are overweight and have a high risk for diabetes, consider being tested at a younger age or more often. Preventing infection Hepatitis B  If you have a higher risk for hepatitis B, you should be screened for this virus. You are considered at high risk for hepatitis B if:  You were born in a country where hepatitis B is common. Ask your health care provider which countries are considered high risk.  Your parents were born in a high-risk country, and you have not been immunized against hepatitis B (hepatitis B vaccine).  You have HIV or AIDS.  You use needles to inject street drugs.  You live with someone who has hepatitis B.  You have had sex with someone who has hepatitis B.  You get hemodialysis treatment.  You take certain medicines for conditions, including cancer, organ transplantation, and autoimmune conditions. Hepatitis C  Blood testing is recommended for:  Everyone born from [birth year] through 1965.  Anyone with known risk factors for hepatitis C. Sexually transmitted infections (STIs)  You should be screened for sexually transmitted infections (STIs) including gonorrhea and chlamydia if:  You are sexually active and are younger than 61 years of  age.  You are older than 61 years of age and your health care provider tells you that you are at risk for this type of infection.  Your sexual activity has changed since you were last screened and you are at an increased risk for chlamydia or gonorrhea. Ask your health care provider if you are at risk.  If you do not have HIV, but are at risk, it may be recommended that you take a prescription medicine daily to prevent HIV infection. This is called pre-exposure prophylaxis (PrEP). You are considered at risk if:  You are sexually active  and do not regularly use condoms or know the HIV status of your partner(s).  You take drugs by injection.  You are sexually active with a partner who has HIV. Talk with your health care provider about whether you are at high risk of being infected with HIV. If you choose to begin PrEP, you should first be tested for HIV. You should then be tested every 3 months for as long as you are taking PrEP. Pregnancy  If you are premenopausal and you may become pregnant, ask your health care provider about preconception counseling.  If you may become pregnant, take 400 to 800 micrograms (mcg) of folic acid every day.  If you want to prevent pregnancy, talk to your health care provider about birth control (contraception). Osteoporosis and menopause  Osteoporosis is a disease in which the bones lose minerals and strength with aging. This can result in serious bone fractures. Your risk for osteoporosis can be identified using a bone density scan.  If you are 59 years of age or older, or if you are at risk for osteoporosis and fractures, ask your health care provider if you should be screened.  Ask your health care provider whether you should take a calcium or vitamin D supplement to lower your risk for osteoporosis.  Menopause may have certain physical symptoms and risks.  Hormone replacement therapy may reduce some of these symptoms and risks. Talk to your health  care provider about whether hormone replacement therapy is right for you. Follow these instructions at home:  Schedule regular health, dental, and eye exams.  Stay current with your immunizations.  Do not use any tobacco products including cigarettes, chewing tobacco, or electronic cigarettes.  If you are pregnant, do not drink alcohol.  If you are breastfeeding, limit how much and how often you drink alcohol.  Limit alcohol intake to no more than 1 drink per day for nonpregnant women. One drink equals 12 ounces of beer, 5 ounces of wine, or 1 ounces of hard liquor.  Do not use street drugs.  Do not share needles.  Ask your health care provider for help if you need support or information about quitting drugs.  Tell your health care provider if you often feel depressed.  Tell your health care provider if you have ever been abused or do not feel safe at home. This information is not intended to replace advice given to you by your health care provider. Make sure you discuss any questions you have with your health care provider. Document Released: 04/13/2011 Document Revised: 03/05/2016 Document Reviewed: 07/02/2015  2017 [employer]

## 2016-11-16 NOTE — Assessment & Plan Note (Addendum)
Check tsh  - there has been some confusion over her dose and it looks like she is taking too high a dose. This was decreased at her last visit. She also did not have follow-up blood work after that medication change Titrate med dose if needed

## 2016-11-16 NOTE — Assessment & Plan Note (Signed)
She states she has decreased her alcohol intake-no longer drinking nightly and mostly just drinking on the weekends

## 2016-11-16 NOTE — Progress Notes (Signed)
Subjective:    Patient ID: Tammy Kennedy, female    DOB: [birth year], 61 y.o.   MRN: [id number]  HPI She is here for a physical exam.   She Has had  a cold intermittently for about 5 weeks.  She continues to experience residual postnasal drip and mucus.  She thinks it will go away. It is not serious, just annoying.  Lightheadedness - she has had this every times she stand.s  It started about one month ago.  In the past she has had this on occasion if she stood up quickly, but has been more consistent and occurring every day.  She is not currently exercising regularly. She states she is drinking a good amount of water. She has decreased her alcohol intake.  Depression, anxiety: She is taking her medication daily as prescribed. She feels both her anxiety and depression are well controlled and she is happy with her current dose of medication.   Medications and allergies reviewed with patient and updated if appropriate.  Patient Active Problem List   Diagnosis Date Noted  . Idiopathic scoliosis 05/14/2016  . Alcohol abuse 05/14/2016  . Rectovaginal fistula   . C. difficile colitis   . Regional enteritis ([place]) 05/27/2009  . HEPATOMEGALY, HX OF 08/31/2008  . Hypothyroidism 07/30/2008  . Hyperlipidemia 07/30/2008  . Anxiety state 07/30/2008  . Depression 07/30/2008  . RENAL CYST 07/30/2008  . DEGENERATIVE DISC DISEASE 07/30/2008  . Osteopenia 07/30/2008    Current Outpatient Prescriptions on File Prior to Visit  Medication Sig Dispense Refill  . azaTHIOprine (IMURAN) 50 MG tablet Take 3 tablets (150 mg total) by mouth daily. 90 tablet 6  . calcium-vitamin D (OSCAL WITH D) 500-200 MG-UNIT tablet Take 2 tablets by mouth daily.    . fenofibrate (TRICOR) 145 MG tablet Take 1 tablet (145 mg total) by mouth daily. Overdue for yearly physical w/labs must see MD for refills 90 tablet 3  . folic acid (FOLVITE) 1 MG tablet Take 2 mg by mouth daily.    Marland Kitchen levothyroxine (SYNTHROID,  LEVOTHROID) 88 MCG tablet TAKE 1 TABLET(88 MCG) BY MOUTH DAILY BEFORE BREAKFAST 30 tablet 0  . mesalamine (LIALDA) 1.2 g EC tablet Take 2.4 g by mouth 2 (two) times daily.    . Potassium Gluconate 550 MG TABS Take 2 tablets by mouth daily.     . Sennosides (EX-LAX PO) Take by mouth as directed.    . valACYclovir (VALTREX) 500 MG tablet TAKE 1 TABLET(500 MG) BY MOUTH DAILY 30 tablet 0  . venlafaxine XR (EFFEXOR-XR) 150 MG 24 hr capsule TAKE 1 CAPSULE(150 MG) BY MOUTH DAILY WITH BREAKFAST 30 capsule 1  . venlafaxine XR (EFFEXOR-XR) 75 MG 24 hr capsule TAKE 1 CAPSULE(75 MG) BY MOUTH DAILY WITH BREAKFAST IN ADDITION TO 150 MG DAILY FOR A TOTAL OF 225 MG DAILY 30 capsule 0  . vitamin B-12 (CYANOCOBALAMIN) 500 MCG tablet Take 500 mcg by mouth daily.       No current facility-administered medications on file prior to visit.     Past Medical History:  Diagnosis Date  . Alcohol abuse 05/14/2016  . ALCOHOL ABUSE, HX OF   . ANEMIA   . ANXIETY DISORDER   . C. difficile colitis   . CROHN'S DISEASE   . DEPRESSION   . DRY EYE SYNDROME   . HEPATOMEGALY, HX OF   . HYPERLIPIDEMIA    intol of statin trials  . HYPOTHYROIDISM   . OSTEOPENIA   . Rectovaginal fistula  2012   chron's disease  . Tubular adenoma 2013    Past Surgical History:  Procedure Laterality Date  . ANAL FISSURE REPAIR    . HEMORRHOID SURGERY    . Left thumb surgery  1989  . MOHS SURGERY  2008   Nose BCC  . Theraputic abortion      Social History   Social History  . Marital status: Divorced    Spouse name: N/A  . Number of children: N/A  . Years of education: N/A   Social History Main Topics  . Smoking status: Never Smoker  . Smokeless tobacco: Never Used  . Alcohol use 12.0 oz/week    20 Glasses of wine per week  . Drug use: No  . Sexual activity: Not Asked   Other Topics Concern  . None   Social History Narrative   Works at Ha[employer]- [employer]- part time    Family History  Problem  Relation Age of Onset  . Adopted: Yes  . Family history unknown: Yes    Review of Systems  Constitutional: Positive for fatigue. Negative for appetite change, chills, fever and unexpected weight change.  HENT: Positive for postnasal drip.   Eyes: Negative for visual disturbance.  Respiratory: Negative for cough, shortness of breath and wheezing.   Cardiovascular: Negative for chest pain, palpitations and leg swelling.  Gastrointestinal: Positive for diarrhea (chronic). Negative for abdominal pain, blood in stool, constipation and nausea.       No gerd  Genitourinary: Negative for dysuria and hematuria.  Musculoskeletal: Positive for back pain. Negative for arthralgias.  Skin: Positive for color change (red spot on nose - will see derm). Negative for rash.  Neurological: Positive for dizziness and light-headedness. Negative for numbness and headaches.  Psychiatric/Behavioral: Positive for dysphoric mood. The patient is nervous/anxious.        Objective:   Vitals:   11/16/16 1110  BP: 104/72  Pulse: (!) 104  Resp: 16  Temp: 98.4 F (36.9 C)   Filed Weights   11/16/16 1110  Weight: 111 lb (50.3 kg)   Body mass index is 21.68 kg/m.  Wt Readings from Last 3 Encounters:  11/16/16 111 lb (50.3 kg)  05/14/16 108 lb 6 oz (49.2 kg)  04/22/16 105 lb (47.6 kg)     Physical Exam Constitutional: She appears well-developed and well-nourished. No distress.  slight tremor present  HENT:  Head: Normocephalic and atraumatic.  Right Ear: External ear normal. Normal ear canal and TM Left Ear: External ear normal.  Normal ear canal and TM Mouth/Throat: Oropharynx is clear and moist.  Eyes: Conjunctivae and EOM are normal.  Neck: Neck supple. No tracheal deviation present. No thyromegaly present.  No carotid bruit  Cardiovascular: Normal rate, regular rhythm and normal heart sounds.   No murmur heard.  No edema. Pulmonary/Chest: Effort normal and breath sounds normal. No respiratory  distress. She has no wheezes. She has no rales.  Breast: deferred to Gyn Abdominal: Soft. She exhibits no distension. There is no tenderness.  Musculoskeletal: Scoliosis with prominent scapula on right  Lymphadenopathy: She has no cervical adenopathy.  Skin: Skin is warm and dry. She is not diaphoretic.  Psychiatric: She has a  Anxious mood and affect. Her behavior is normal.         Assessment & Plan:    Physical exam: Screening blood work  Ordered  Immunizations discussed shingles vaccine-she will consider this at her next appointment -  Colonoscopy  Up to date  [facility]  this year-she will do when she sees her gynecologist  Gyn-Up-to-date  Dexa - up-to-date, but due later this year  Eye exams  Up to date   Exercise - Currently none. Stressed regular exercise  Weight  - Normal BMI    Skin  -  has a red area on her nose-she will follow-up with dermatology  Substance abuse - she states she has decreased her alcohol intake, but I am not sure she has or not. Hopefully she has decreased her intake-we'll continue to monitor   See Problem List for Assessment and Plan of chronic medical problems.   FU in 6 months

## 2016-11-19 ENCOUNTER — Other Ambulatory Visit: Payer: Self-pay | Admitting: Internal Medicine

## 2016-11-19 DIAGNOSIS — E039 Hypothyroidism, unspecified: Secondary | ICD-10-CM

## 2016-11-19 MED ORDER — LEVOTHYROXINE SODIUM 75 MCG PO TABS: 75.0000 ug | ORAL_TABLET | Freq: Every day | ORAL | 3 refills | Status: DC

## 2016-12-01 ENCOUNTER — Other Ambulatory Visit: Payer: Self-pay | Admitting: Internal Medicine

## 2016-12-11 ENCOUNTER — Other Ambulatory Visit: Payer: Self-pay | Admitting: Internal Medicine

## 2017-01-20 ENCOUNTER — Other Ambulatory Visit: Payer: Self-pay | Admitting: Internal Medicine

## 2017-01-28 ENCOUNTER — Other Ambulatory Visit: Payer: Self-pay | Admitting: Physician Assistant

## 2017-02-18 ENCOUNTER — Other Ambulatory Visit: Payer: Self-pay | Admitting: Gastroenterology

## 2017-02-19 NOTE — Telephone Encounter (Signed)
Shirlean Mylar, This looks like a Dr. Silverio Decamp pt.

## 2017-03-05 ENCOUNTER — Other Ambulatory Visit: Payer: Self-pay | Admitting: Internal Medicine

## 2017-03-05 DIAGNOSIS — Z1231 Encounter for screening mammogram for malignant neoplasm of breast: Secondary | ICD-10-CM

## 2017-03-14 ENCOUNTER — Other Ambulatory Visit: Payer: Self-pay | Admitting: Internal Medicine

## 2017-03-16 ENCOUNTER — Other Ambulatory Visit: Payer: Self-pay | Admitting: Gastroenterology

## 2017-03-23 ENCOUNTER — Other Ambulatory Visit: Payer: Self-pay | Admitting: Gastroenterology

## 2017-03-24 ENCOUNTER — Ambulatory Visit
Admission: RE | Admit: 2017-03-24 | Discharge: 2017-03-24 | Disposition: A | Discharge status: Home / Self Care | Payer: BLUE CROSS/BLUE SHIELD | Source: Ambulatory Visit | Attending: Internal Medicine | Admitting: Internal Medicine

## 2017-03-24 DIAGNOSIS — Z1231 Encounter for screening mammogram for malignant neoplasm of breast: Secondary | ICD-10-CM

## 2017-04-20 ENCOUNTER — Other Ambulatory Visit: Payer: Self-pay | Admitting: Gastroenterology

## 2017-04-27 ENCOUNTER — Ambulatory Visit (INDEPENDENT_AMBULATORY_CARE_PROVIDER_SITE_OTHER): Payer: BLUE CROSS/BLUE SHIELD | Admitting: Gastroenterology

## 2017-04-27 ENCOUNTER — Other Ambulatory Visit (INDEPENDENT_AMBULATORY_CARE_PROVIDER_SITE_OTHER): Payer: BLUE CROSS/BLUE SHIELD

## 2017-04-27 ENCOUNTER — Encounter: Payer: Self-pay | Admitting: Gastroenterology

## 2017-04-27 VITALS — BP 80/50 | HR 80 | Ht 59.5 in | Wt 111.8 lb

## 2017-04-27 DIAGNOSIS — K5000 Crohn's disease of small intestine without complications: Secondary | ICD-10-CM

## 2017-04-27 LAB — CBC WITH DIFFERENTIAL/PLATELET
BASOS ABS: 0 10*3/uL (ref 0.0–0.1)
Basophils Relative: 0.7 % (ref 0.0–3.0)
EOS PCT: 0.8 % (ref 0.0–5.0)
Eosinophils Absolute: 0 10*3/uL (ref 0.0–0.7)
HCT: 35.6 % — ABNORMAL LOW (ref 36.0–46.0)
Hemoglobin: 12.4 g/dL (ref 12.0–15.0)
LYMPHS ABS: 1 10*3/uL (ref 0.7–4.0)
Lymphocytes Relative: 29.4 % (ref 12.0–46.0)
MCHC: 34.7 g/dL (ref 30.0–36.0)
MCV: 116 fl — ABNORMAL HIGH (ref 78.0–100.0)
Monocytes Absolute: 0.2 10*3/uL (ref 0.1–1.0)
Monocytes Relative: 6.7 % (ref 3.0–12.0)
NEUTROS PCT: 62.4 % (ref 43.0–77.0)
Neutro Abs: 2.1 10*3/uL (ref 1.4–7.7)
Platelets: 361 10*3/uL (ref 150.0–400.0)
RBC: 3.07 Mil/uL — AB (ref 3.87–5.11)
RDW: 15.3 % (ref 11.5–15.5)
WBC: 3.3 10*3/uL — AB (ref 4.0–10.5)

## 2017-04-27 LAB — VITAMIN B12: Vitamin B-12: 819 pg/mL (ref 211–911)

## 2017-04-27 LAB — IBC PANEL
Iron: 107 ug/dL (ref 42–145)
SATURATION RATIOS: 20.8 % (ref 20.0–50.0)
Transferrin: 367 mg/dL — ABNORMAL HIGH (ref 212.0–360.0)

## 2017-04-27 LAB — FERRITIN: FERRITIN: 238.9 ng/mL (ref 10.0–291.0)

## 2017-04-27 LAB — COMPREHENSIVE METABOLIC PANEL
ALK PHOS: 62 U/L (ref 39–117)
ALT: 7 U/L (ref 0–35)
AST: 16 U/L (ref 0–37)
Albumin: 4.6 g/dL (ref 3.5–5.2)
BUN: 18 mg/dL (ref 6–23)
CO2: 30 mEq/L (ref 19–32)
CREATININE: 0.86 mg/dL (ref 0.40–1.20)
Calcium: 11.3 mg/dL — ABNORMAL HIGH (ref 8.4–10.5)
Chloride: 100 mEq/L (ref 96–112)
GFR: 71.32 mL/min (ref >60.00)
GLUCOSE: 101 mg/dL — AB (ref 70–99)
POTASSIUM: 4.5 meq/L (ref 3.5–5.1)
SODIUM: 138 meq/L (ref 135–145)
TOTAL PROTEIN: 7.3 g/dL (ref 6.0–8.3)
Total Bilirubin: 0.4 mg/dL (ref 0.2–1.2)

## 2017-04-27 LAB — FOLATE: Folate: >24 ng/mL (ref >5.9)

## 2017-04-27 LAB — HIGH SENSITIVITY CRP: CRP, High Sensitivity: 0.98 mg/L (ref 0.000–5.000)

## 2017-04-27 MED ORDER — NA SULFATE-K SULFATE-MG SULF 17.5-3.13-1.6 GM/177ML PO SOLN: 1.0000 | Freq: Once | ORAL | 0 refills | Status: AC

## 2017-04-27 NOTE — Patient Instructions (Addendum)
You have been scheduled for a bone density test on 04/29/2017 at 9am. Please arrive 15 minutes prior to your scheduled appointment to radiology on the basement floor of [facility] location for this test. If you need to cancel or reschedule for any reason, please contact radiology at [phone].  Preparation for test is as follows:   If you are taking calcium, discontinue this 24-48 hours prior to your appointment.   Wear pants with an elastic waistband (or without any metal such as a zipper).   Do not wear an underwire bra.   We do have gowns if you are unable to find appropriate clothing without metal.   Please bring a list of all current medications.    Go to the basement today for labs  Continue Lialda and Imuran  We will refer you to dermatology and contact you with that appointment  ( March 27th 2019 10:40am)  You have been scheduled for a colonoscopy. Please follow written instructions given to you at your visit today.  Please pick up your prep supplies at the pharmacy within the next 1-3 days. If you use inhalers (even only as needed), please bring them with you on the day of your procedure.

## 2017-04-27 NOTE — Progress Notes (Signed)
Tammy Kennedy    [id number]    11/22/1955  Primary Care Physician:Burns, Claudina Lick, MD  Referring Physician: Binnie Rail, MD [place], [place] [address]  Chief complaint:  Crohn's disease  HPI:  61 year old female with long-standing history of Crohn's disease complicated by rectovaginal fistula on maintenance therapy with Imuran here for follow-up visit. She had exacerbation of symptoms in February 2017 and was noted to have severe ulceration of the terminal ileum, unable to intubate TI and cannot exclude possible stricture. She also had C. difficile colitis that was treated. Patient reports significant improvement of symptoms in the past 1 year after increasing the Imuran dose to 150 mg daily. Denies any nausea, vomiting, abdominal pain, melena or bright red blood per rectum. She is staying well overall.   Outpatient Encounter Prescriptions as of 04/27/2017  Medication Sig  . azaTHIOprine (IMURAN) 50 MG tablet TAKE 3 TABLETS BY MOUTH EVERY DAY  . calcium-vitamin D (OSCAL WITH D) 500-200 MG-UNIT tablet Take 2 tablets by mouth daily.  . fenofibrate (TRICOR) 145 MG tablet Take 1 tablet (145 mg total) by mouth daily. Overdue for yearly physical w/labs must see MD for refills  . folic acid (FOLVITE) 1 MG tablet Take 2 mg by mouth daily.  Marland Kitchen levothyroxine (SYNTHROID, LEVOTHROID) 75 MCG tablet Take 1 tablet (75 mcg total) by mouth daily.  . mesalamine (LIALDA) 1.2 g EC tablet Take 2.4 g by mouth 2 (two) times daily.  . mesalamine (LIALDA) 1.2 g EC tablet TAKE 2 TABLETS BY MOUTH TWICE DAILY  . Potassium Gluconate 550 MG TABS Take 2 tablets by mouth daily.   . Sennosides (EX-LAX PO) Take by mouth as directed.  . valACYclovir (VALTREX) 500 MG tablet TAKE 1 TABLET(500 MG) BY MOUTH DAILY  . venlafaxine XR (EFFEXOR-XR) 150 MG 24 hr capsule TAKE 1 CAPSULE(150 MG) BY MOUTH DAILY WITH BREAKFAST  . venlafaxine XR (EFFEXOR-XR) 75 MG 24 hr capsule TAKE 1 CAPSULE(75 MG) BY MOUTH  DAILY WITH BREAKFAST IN ADDITION TO 150 MG DAILY FOR A TOTAL OF 225 MG DAILY  . vitamin B-12 (CYANOCOBALAMIN) 500 MCG tablet Take 500 mcg by mouth daily.    . [DISCONTINUED] MAGNESIUM PO Take 350 mg by mouth daily.   No facility-administered encounter medications on file as of 04/27/2017.     Allergies as of 04/27/2017  . (No Known Allergies)    Past Medical History:  Diagnosis Date  . Alcohol abuse 05/14/2016  . ALCOHOL ABUSE, HX OF   . ANEMIA   . ANXIETY DISORDER   . C. difficile colitis   . CROHN'S DISEASE   . DEPRESSION   . DRY EYE SYNDROME   . HEPATOMEGALY, HX OF   . HYPERLIPIDEMIA    intol of statin trials  . HYPOTHYROIDISM   . OSTEOPENIA   . Rectovaginal fistula 2012   chron's disease  . Tubular adenoma 2013    Past Surgical History:  Procedure Laterality Date  . ANAL FISSURE REPAIR    . HEMORRHOID SURGERY    . Left thumb surgery  1989  . MOHS SURGERY  2008   Nose BCC  . Theraputic abortion      Family History  Problem Relation Age of Onset  . Adopted: Yes  . Breast cancer Neg Hx     Social History   Social History  . Marital status: Divorced    Spouse name: N/A  . Number of children: N/A  .  Years of education: N/A   Occupational History  . Not on file.   Social History Main Topics  . Smoking status: Never Smoker  . Smokeless tobacco: Never Used  . Alcohol use 12.0 oz/week    20 Glasses of wine per week  . Drug use: No  . Sexual activity: Not on file   Other Topics Concern  . 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  HENT: Negative.   Eyes: Negative for blurred vision.  Respiratory: Negative for cough, shortness of breath and wheezing.   Cardiovascular: Negative for chest pain and palpitations.  Gastrointestinal: as per HPI Genitourinary: Negative for dysuria, urgency, frequency  and hematuria.  Musculoskeletal: Negative for myalgias, back pain and joint pain.  Skin: Negative for itching and rash.  Neurological: Negative for dizziness, tremors, focal weakness, seizures and loss of consciousness.  Endo/Heme/Allergies: Positive for seasonal allergies.  Psychiatric/Behavioral: Negative for depression, suicidal ideas and hallucinations.  All other systems reviewed and are negative.   Physical Exam: Vitals:   04/27/17 1427  BP: (!) 80/50  Pulse: 80   Body mass index is 22.2 kg/m. Gen:      No acute distress HEENT:  EOMI, sclera anicteric Neck:     No masses; no thyromegaly Lungs:    Clear to auscultation bilaterally; normal respiratory effort CV:         Regular rate and rhythm; no murmurs Abd:      + bowel sounds; soft, non-tender; no palpable masses, no distension Ext:    No edema; adequate peripheral perfusion Skin:      Warm and dry; no rash Neuro: alert and oriented x 3 Psych: normal mood and affect  Data Reviewed:  Reviewed labs, radiology imaging, old records and pertinent past GI work up   Assessment and Plan/Recommendations:  61 year old female with history of long-standing Crohn's disease currently in remission on maintenance dose of  Imuran 150 mg daily and mesalamine We will schedule for colonoscopy to evaluate disease activity and prevent recurrent flares We will refer to dermatology for annual skin exam Due for a DEXA scan, we'll schedule it Follow-up CBC, CMP, CRP, TB QuantiFERON, ferritin and B12 folate Return in 6 months or sooner if needed  The risks and benefits as well as alternatives of endoscopic procedure(s) have been discussed and reviewed. All questions answered. The patient agrees to proceed.   25 minutes was spent face-to-face with the patient. Greater than 50% of the time used for counseling as well as treatment plan and follow-up. She had multiple questions which were answered to her satisfaction  K. Denzil Magnuson ,  MD [phone] Mon-Fri 8a-5p [phone] after 5p, weekends, holidays  CC: Burns, Claudina Lick, MD

## 2017-04-28 MED ORDER — MESALAMINE 1.2 G PO TBEC: 2.4000 g | DELAYED_RELEASE_TABLET | Freq: Two times a day (BID) | ORAL | 6 refills | Status: DC

## 2017-04-29 ENCOUNTER — Ambulatory Visit (INDEPENDENT_AMBULATORY_CARE_PROVIDER_SITE_OTHER)
Admission: RE | Admit: 2017-04-29 | Discharge: 2017-04-29 | Disposition: A | Discharge status: Home / Self Care | Payer: BLUE CROSS/BLUE SHIELD | Source: Ambulatory Visit | Attending: Gastroenterology | Admitting: Gastroenterology

## 2017-04-29 DIAGNOSIS — K5000 Crohn's disease of small intestine without complications: Secondary | ICD-10-CM | POA: Diagnosis not present

## 2017-04-29 LAB — QUANTIFERON TB GOLD ASSAY (BLOOD)
Interferon Gamma Release Assay: NEGATIVE
MITOGEN-NIL SO: 9.03 [IU]/mL
QUANTIFERON NIL VALUE: 0.13 [IU]/mL
Quantiferon Tb Ag Minus Nil Value: <0 IU/mL

## 2017-05-12 DIAGNOSIS — M810 Age-related osteoporosis without current pathological fracture: Secondary | ICD-10-CM

## 2017-05-12 HISTORY — DX: Age-related osteoporosis without current pathological fracture: M81.0

## 2017-05-16 NOTE — Progress Notes (Signed)
Subjective:    Patient ID: Tammy Kennedy, female    DOB: [birth year], 61 y.o.   MRN: [id number]  HPI    Medications and allergies reviewed with patient and updated if appropriate.  Patient Active Problem List   Diagnosis Date Noted  . Idiopathic scoliosis 05/14/2016  . Alcohol abuse 05/14/2016  . Rectovaginal fistula   . C. difficile colitis   . Regional enteritis ([place]) 05/27/2009  . HEPATOMEGALY, HX OF 08/31/2008  . Hypothyroidism 07/30/2008  . Hyperlipidemia 07/30/2008  . Anxiety state 07/30/2008  . Depression 07/30/2008  . RENAL CYST 07/30/2008  . DEGENERATIVE DISC DISEASE 07/30/2008  . Osteopenia 07/30/2008    Current Outpatient Prescriptions on File Prior to Visit  Medication Sig Dispense Refill  . azaTHIOprine (IMURAN) 50 MG tablet TAKE 3 TABLETS BY MOUTH EVERY DAY 90 tablet 0  . calcium-vitamin D (OSCAL WITH D) 500-200 MG-UNIT tablet Take 2 tablets by mouth daily.    . fenofibrate (TRICOR) 145 MG tablet Take 1 tablet (145 mg total) by mouth daily. Overdue for yearly physical w/labs must see MD for refills 90 tablet 3  . folic acid (FOLVITE) 1 MG tablet Take 2 mg by mouth daily.    Marland Kitchen levothyroxine (SYNTHROID, LEVOTHROID) 75 MCG tablet Take 1 tablet (75 mcg total) by mouth daily. 90 tablet 3  . mesalamine (LIALDA) 1.2 g EC tablet TAKE 2 TABLETS BY MOUTH TWICE DAILY 120 tablet 0  . mesalamine (LIALDA) 1.2 g EC tablet Take 2 tablets (2.4 g total) by mouth 2 (two) times daily. 120 tablet 6  . Potassium Gluconate 550 MG TABS Take 2 tablets by mouth daily.     . Sennosides (EX-LAX PO) Take by mouth as directed.    . valACYclovir (VALTREX) 500 MG tablet TAKE 1 TABLET(500 MG) BY MOUTH DAILY 30 tablet 5  . venlafaxine XR (EFFEXOR-XR) 150 MG 24 hr capsule TAKE 1 CAPSULE(150 MG) BY MOUTH DAILY WITH BREAKFAST 30 capsule 5  . venlafaxine XR (EFFEXOR-XR) 75 MG 24 hr capsule TAKE 1 CAPSULE(75 MG) BY MOUTH DAILY WITH BREAKFAST IN ADDITION TO 150 MG DAILY FOR A TOTAL OF 225 MG  DAILY 30 capsule 2  . vitamin B-12 (CYANOCOBALAMIN) 500 MCG tablet Take 500 mcg by mouth daily.       No current facility-administered medications on file prior to visit.     Past Medical History:  Diagnosis Date  . Alcohol abuse 05/14/2016  . ALCOHOL ABUSE, HX OF   . ANEMIA   . ANXIETY DISORDER   . C. difficile colitis   . CROHN'S DISEASE   . DEPRESSION   . DRY EYE SYNDROME   . HEPATOMEGALY, HX OF   . HYPERLIPIDEMIA    intol of statin trials  . HYPOTHYROIDISM   . OSTEOPENIA   . Rectovaginal fistula 2012   chron's disease  . Tubular adenoma 2013    Past Surgical History:  Procedure Laterality Date  . ANAL FISSURE REPAIR    . HEMORRHOID SURGERY    . Left thumb surgery  1989  . MOHS SURGERY  2008   Nose BCC  . Theraputic abortion      Social History   Social History  . Marital status: Divorced    Spouse name: N/A  . Number of children: N/A  . Years of education: N/A   Social History Main Topics  . Smoking status: Never Smoker  . Smokeless tobacco: Never Used  . Alcohol use 12.0 oz/week    20 Glasses of  wine per week  . Drug use: No  . Sexual activity: Not on file   Other Topics Concern  . Not on file   Social History Narrative   Works at [employer]- [employer]- part time    Family History  Problem Relation Age of Onset  . Adopted: Yes  . Breast cancer Neg Hx     Review of Systems     Objective:  There were no vitals filed for this visit. Wt Readings from Last 3 Encounters:  04/27/17 111 lb 12.8 oz (50.7 kg)  11/16/16 111 lb (50.3 kg)  05/14/16 108 lb 6 oz (49.2 kg)   There is no height or weight on file to calculate BMI.   Physical Exam         Assessment & Plan:    See Problem List for Assessment and Plan of chronic medical problems.    This encounter was created in error - please disregard.

## 2017-05-17 ENCOUNTER — Encounter: Payer: BLUE CROSS/BLUE SHIELD | Admitting: Internal Medicine

## 2017-05-19 ENCOUNTER — Ambulatory Visit (INDEPENDENT_AMBULATORY_CARE_PROVIDER_SITE_OTHER): Payer: BLUE CROSS/BLUE SHIELD | Admitting: Internal Medicine

## 2017-05-19 ENCOUNTER — Other Ambulatory Visit (INDEPENDENT_AMBULATORY_CARE_PROVIDER_SITE_OTHER): Payer: BLUE CROSS/BLUE SHIELD

## 2017-05-19 ENCOUNTER — Encounter: Payer: Self-pay | Admitting: Internal Medicine

## 2017-05-19 VITALS — BP 110/76 | HR 90 | Temp 99.2°F | Resp 16 | Wt 110.0 lb

## 2017-05-19 DIAGNOSIS — E784 Other hyperlipidemia: Secondary | ICD-10-CM

## 2017-05-19 DIAGNOSIS — M85852 Other specified disorders of bone density and structure, left thigh: Secondary | ICD-10-CM

## 2017-05-19 DIAGNOSIS — F411 Generalized anxiety disorder: Secondary | ICD-10-CM

## 2017-05-19 DIAGNOSIS — E7849 Other hyperlipidemia: Secondary | ICD-10-CM

## 2017-05-19 DIAGNOSIS — E039 Hypothyroidism, unspecified: Secondary | ICD-10-CM

## 2017-05-19 DIAGNOSIS — F3289 Other specified depressive episodes: Secondary | ICD-10-CM

## 2017-05-19 LAB — TSH: TSH: 0.15 u[IU]/mL — AB (ref 0.35–4.50)

## 2017-05-19 LAB — LIPID PANEL
CHOLESTEROL: 223 mg/dL — AB (ref 0–200)
HDL: 81.8 mg/dL (ref >39.00)
LDL Cholesterol: 118 mg/dL — ABNORMAL HIGH (ref 0–99)
NONHDL: 141.53
Total CHOL/HDL Ratio: 3
Triglycerides: 120 mg/dL (ref 0.0–149.0)
VLDL: 24 mg/dL (ref 0.0–40.0)

## 2017-05-19 MED ORDER — FENOFIBRATE 145 MG PO TABS: 145.0000 mg | ORAL_TABLET | Freq: Every day | ORAL | 3 refills | Status: DC

## 2017-05-19 NOTE — Assessment & Plan Note (Signed)
Controlled, stable Continue current dose of medication  

## 2017-05-19 NOTE — Assessment & Plan Note (Signed)
Check lipid panel  Continue daily statin Regular exercise and healthy diet encouraged  

## 2017-05-19 NOTE — Patient Instructions (Addendum)
  Test(s) ordered today. Your results will be released to [place] (or called to you) after review, usually within 72hours after test completion. If any changes need to be made, you will be notified at that same time.   Medications reviewed and updated.  No changes recommended at this time.  Your prescription(s) have been submitted to your pharmacy. Please take as directed and contact our office if you believe you are having problem(s) with the medication(s).    Please followup in 6 months

## 2017-05-19 NOTE — Assessment & Plan Note (Signed)
Check tsh  Titrate med dose if needed  

## 2017-05-19 NOTE — Assessment & Plan Note (Signed)
DEXA up-to-date Taking calcium and vitamin D Continue regular exercise

## 2017-05-19 NOTE — Progress Notes (Signed)
Subjective:    Patient ID: Tammy Kennedy, female    DOB: 1956/06/24, 61 y.o.   MRN: [id number]  HPI The patient is here for follow up.  Hypothyroidism:  She is taking her medication daily.  She denies any recent changes in energy or weight that are unexplained.   Depression, Anxiety: She is taking her medication daily as prescribed. She denies any side effects from the medication. She feels her depression and anxiety are well controlled and she is happy with her current dose of medication.   Crohn's disease:  She is following with GI and her Crohn's disease is under control. She will be having a surveillance colonoscopy this fall.  Osteopenia: Her dexa is up to date.  She is exercising regularly.  She is taking calcium and vitamin D daily.   Lightheadedness:  It occurs when she stands up and it last a little while.  She denies any when sitting or walking around.   Pain in right back posterior hip - it hurts with sitting, walking and transitioing. She knows she has some arthritis is wondering if the pain was related to that.  Medications and allergies reviewed with patient and updated if appropriate.  Patient Active Problem List   Diagnosis Date Noted  . Idiopathic scoliosis 05/14/2016  . Alcohol abuse 05/14/2016  . Rectovaginal fistula   . C. difficile colitis   . Regional enteritis ([place]) 05/27/2009  . HEPATOMEGALY, HX OF 08/31/2008  . Hypothyroidism 07/30/2008  . Hyperlipidemia 07/30/2008  . Anxiety state 07/30/2008  . Depression 07/30/2008  . RENAL CYST 07/30/2008  . DEGENERATIVE DISC DISEASE 07/30/2008  . Osteopenia 07/30/2008    Current Outpatient Prescriptions on File Prior to Visit  Medication Sig Dispense Refill  . azaTHIOprine (IMURAN) 50 MG tablet TAKE 3 TABLETS BY MOUTH EVERY DAY 90 tablet 0  . calcium-vitamin D (OSCAL WITH D) 500-200 MG-UNIT tablet Take 2 tablets by mouth daily.    . fenofibrate (TRICOR) 145 MG tablet Take 1 tablet (145 mg total) by mouth  daily. Overdue for yearly physical w/labs must see MD for refills 90 tablet 3  . folic acid (FOLVITE) 1 MG tablet Take 2 mg by mouth daily.    Marland Kitchen levothyroxine (SYNTHROID, LEVOTHROID) 75 MCG tablet Take 1 tablet (75 mcg total) by mouth daily. 90 tablet 3  . mesalamine (LIALDA) 1.2 g EC tablet Take 2 tablets (2.4 g total) by mouth 2 (two) times daily. 120 tablet 6  . Potassium Gluconate 550 MG TABS Take 2 tablets by mouth daily.     . Sennosides (EX-LAX PO) Take by mouth as directed.    . valACYclovir (VALTREX) 500 MG tablet TAKE 1 TABLET(500 MG) BY MOUTH DAILY 30 tablet 5  . venlafaxine XR (EFFEXOR-XR) 150 MG 24 hr capsule TAKE 1 CAPSULE(150 MG) BY MOUTH DAILY WITH BREAKFAST 30 capsule 5  . venlafaxine XR (EFFEXOR-XR) 75 MG 24 hr capsule TAKE 1 CAPSULE(75 MG) BY MOUTH DAILY WITH BREAKFAST IN ADDITION TO 150 MG DAILY FOR A TOTAL OF 225 MG DAILY 30 capsule 2  . vitamin B-12 (CYANOCOBALAMIN) 500 MCG tablet Take 500 mcg by mouth daily.       No current facility-administered medications on file prior to visit.     Past Medical History:  Diagnosis Date  . Alcohol abuse 05/14/2016  . ALCOHOL ABUSE, HX OF   . ANEMIA   . ANXIETY DISORDER   . C. difficile colitis   . CROHN'S DISEASE   . DEPRESSION   .  DRY EYE SYNDROME   . HEPATOMEGALY, HX OF   . HYPERLIPIDEMIA    intol of statin trials  . HYPOTHYROIDISM   . OSTEOPENIA   . Rectovaginal fistula 2012   chron's disease  . Tubular adenoma 2013    Past Surgical History:  Procedure Laterality Date  . ANAL FISSURE REPAIR    . HEMORRHOID SURGERY    . Left thumb surgery  1989  . MOHS SURGERY  2008   Nose BCC  . Theraputic abortion      Social History   Social History  . Marital status: Divorced    Spouse name: N/A  . Number of children: N/A  . Years of education: N/A   Social History Main Topics  . Smoking status: Never Smoker  . Smokeless tobacco: Never Used  . Alcohol use 12.0 oz/week    20 Glasses of wine per week  . Drug use: No   . Sexual activity: Not on file   Other Topics Concern  . 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 Adopted: Yes  . Breast cancer Neg Hx     Review of Systems  Constitutional: Positive for fatigue. Negative for chills and fever.       Hot flashes  Respiratory: Negative for cough, shortness of breath and wheezing.   Cardiovascular: Negative for chest pain, palpitations and leg swelling.  Musculoskeletal: Positive for back pain.  Neurological: Positive for light-headedness. Negative for headaches.  Psychiatric/Behavioral: Positive for sleep disturbance.       Objective:   Vitals:   05/19/17 1040  BP: 110/76  Pulse: 90  Resp: 16  Temp: 99.2 F (37.3 C)   Wt Readings from Last 3 Encounters:  05/19/17 110 lb (49.9 kg)  04/27/17 111 lb 12.8 oz (50.7 kg)  11/16/16 111 lb (50.3 kg)   Body mass index is 21.85 kg/m.   Physical Exam    Constitutional: Appears well-developed and well-nourished. No distress.  HENT:  Head: Normocephalic and atraumatic.  Neck: Neck supple. No tracheal deviation present. No thyromegaly present.  No cervical lymphadenopathy Cardiovascular: Normal rate, regular rhythm and normal heart sounds.   No murmur heard. No carotid bruit .  No edema Pulmonary/Chest: Effort normal and breath sounds normal. No respiratory distress. No has no wheezes. No rales.  Abdomen: Soft, nontender, nondistended  Skin: Skin is warm and dry. Not diaphoretic.  Psychiatric: Normal mood and affect. Behavior is normal.      Assessment & Plan:    See Problem List for Assessment and Plan of chronic medical problems.

## 2017-05-25 ENCOUNTER — Other Ambulatory Visit: Payer: Self-pay | Admitting: Emergency Medicine

## 2017-05-25 DIAGNOSIS — E039 Hypothyroidism, unspecified: Secondary | ICD-10-CM

## 2017-05-25 MED ORDER — LEVOTHYROXINE SODIUM 50 MCG PO TABS: 50.0000 ug | ORAL_TABLET | Freq: Every day | ORAL | 0 refills | Status: DC

## 2017-05-27 ENCOUNTER — Other Ambulatory Visit: Payer: Self-pay | Admitting: Internal Medicine

## 2017-05-27 ENCOUNTER — Other Ambulatory Visit: Payer: Self-pay | Admitting: Gastroenterology

## 2017-06-11 ENCOUNTER — Other Ambulatory Visit: Payer: Self-pay | Admitting: Internal Medicine

## 2017-06-25 ENCOUNTER — Other Ambulatory Visit: Payer: Self-pay | Admitting: Gastroenterology

## 2017-06-25 ENCOUNTER — Other Ambulatory Visit: Payer: Self-pay | Admitting: Internal Medicine

## 2017-06-25 ENCOUNTER — Encounter: Payer: Self-pay | Admitting: Gastroenterology

## 2017-06-29 DIAGNOSIS — H11153 Pinguecula, bilateral: Secondary | ICD-10-CM | POA: Diagnosis not present

## 2017-06-30 ENCOUNTER — Telehealth: Payer: Self-pay | Admitting: *Deleted

## 2017-06-30 MED ORDER — NA SULFATE-K SULFATE-MG SULF 17.5-3.13-1.6 GM/177ML PO SOLN: ORAL | 0 refills | Status: DC

## 2017-06-30 NOTE — Telephone Encounter (Signed)
Prep sent today again  It was sent to her pharmacy on 04/27/2017 but she has waited until now to pick it up. I have resent it and contacted the patient

## 2017-06-30 NOTE — Telephone Encounter (Signed)
Pt states she has not received a call from pharmacy to go pick up prep for colon on Friday 07/02/17. Pt had ov on 04/27/17

## 2017-07-01 ENCOUNTER — Telehealth: Payer: Self-pay | Admitting: Gastroenterology

## 2017-07-01 NOTE — Telephone Encounter (Signed)
Informed patient we do not do PA on prep kits for procedures. They are not normally approved. Informed patient we do have a free sample of Suprep patien can pick up at our front desk. Patient states she will pick it up today.

## 2017-07-02 ENCOUNTER — Ambulatory Visit (AMBULATORY_SURGERY_CENTER): Payer: BLUE CROSS/BLUE SHIELD | Admitting: Gastroenterology

## 2017-07-02 ENCOUNTER — Encounter: Payer: Self-pay | Admitting: Gastroenterology

## 2017-07-02 VITALS — BP 111/72 | HR 61 | Temp 97.8°F | Resp 23 | Ht 59.5 in | Wt 111.0 lb

## 2017-07-02 DIAGNOSIS — K6389 Other specified diseases of intestine: Secondary | ICD-10-CM | POA: Diagnosis not present

## 2017-07-02 DIAGNOSIS — K5090 Crohn's disease, unspecified, without complications: Secondary | ICD-10-CM | POA: Diagnosis not present

## 2017-07-02 DIAGNOSIS — K5000 Crohn's disease of small intestine without complications: Secondary | ICD-10-CM | POA: Diagnosis not present

## 2017-07-02 DIAGNOSIS — K6289 Other specified diseases of anus and rectum: Secondary | ICD-10-CM | POA: Diagnosis not present

## 2017-07-02 MED ORDER — SODIUM CHLORIDE 0.9 % IV SOLN: 500.0000 mL | INTRAVENOUS | Status: DC

## 2017-07-02 NOTE — Op Note (Signed)
[facility] Patient Name: Tammy Kennedy Procedure Date: 07/02/2017 1:57 PM MRN: [id number] Endoscopist: Mauri Pole , MD Age: 61 Referring MD:  Date of Birth: 05/28/1956 Gender: Female Account #: [id number] Procedure:                Colonoscopy Indications:              High risk colon cancer surveillance: Crohn's small                            and large intestine Medicines:                Monitored Anesthesia Care Procedure:                Pre-Anesthesia Assessment:                           - Prior to the procedure, a History and Physical                            was performed, and patient medications and                            allergies were reviewed. The patient's tolerance of                            previous anesthesia was also reviewed. The risks                            and benefits of the procedure and the sedation                            options and risks were discussed with the patient.                            All questions were answered, and informed consent                            was obtained. Prior Anticoagulants: The patient has                            taken no previous anticoagulant or antiplatelet                            agents. ASA Grade Assessment: II - A patient with                            mild systemic disease. After reviewing the risks                            and benefits, the patient was deemed in                            satisfactory condition to undergo the procedure.  After obtaining informed consent, the colonoscope                            was passed under direct vision. Throughout the                            procedure, the patient's blood pressure, pulse, and                            oxygen saturations were monitored continuously. The                            Colonoscope was introduced through the anus and                            advanced to the the terminal ileum,  with                            identification of the appendiceal orifice and IC                            valve. The colonoscopy was performed without                            difficulty. The patient tolerated the procedure                            well. The quality of the bowel preparation was                            excellent. The terminal ileum, ileocecal valve,                            appendiceal orifice, and rectum were photographed. Scope In: 2:12:09 PM Scope Out: 2:33:26 PM Scope Withdrawal Time: 0 hours 11 minutes 41 seconds  Total Procedure Duration: 0 hours 21 minutes 17 seconds  Findings:                 The perianal and digital rectal examinations were                            normal.                           The Simple Endoscopic Score for Crohn's Disease was                            determined based on the endoscopic appearance of                            the mucosa in the following segments:                           - Ileum: Findings include no ulcers present, no  ulcerated surfaces, no affected surfaces and no                            narrowings.                           - Right Colon: Findings include no ulcers present,                            no ulcerated surfaces, less than 50% of surfaces                            affected with scarring and altered vascular pattern                            and no narrowings.                           - Transverse Colon: Findings include no ulcers                            present, no ulcerated surfaces, less than 50% of                            surfaces affected with scarring and altered                            vascular pattern and no narrowings.                           - Left Colon: Findings include no ulcers present,                            no ulcerated surfaces, less than 50% of surfaces                            affected with scarring and altered vascular pattern                             and no narrowings.                           - Rectum: Findings include no ulcers present, no                            ulcerated surfaces, less than 50% of surfaces                            affected with scarring and altered vascular pattern                            and no narrowings.                           - Total SES-CD scoring: total aggregate score  was                            4. Biopsies were taken with a cold forceps for                            histology.                           Scattered small-mouthed diverticula were found in                            the terminal ileum, sigmoid colon and descending                            colon.                           Non-bleeding internal hemorrhoids were found during                            retroflexion. The hemorrhoids were small. Complications:            No immediate complications. Estimated Blood Loss:     Estimated blood loss was minimal. Impression:               - Simple Endoscopic Score for Crohn's Disease:                            total aggregate score was 4, mucosal inflammatory                            changes secondary to Crohn's disease, with ileitis                            and colitis and secondary to Crohn's disease, in                            remission. Biopsied.                           - Diverticulosis in the sigmoid colon and in the                            descending colon.                           - Non-bleeding internal hemorrhoids. Recommendation:           - Patient has a contact number available for                            emergencies. The signs and symptoms of potential                            delayed complications were discussed with the  patient. Return to normal activities tomorrow.                            Written discharge instructions were provided to the                            patient.                            - Resume previous diet.                           - Continue present medications.                           - Await pathology results.                           - No aspirin, ibuprofen, naproxen, or other                            non-steroidal anti-inflammatory drugs.                           - Repeat colonoscopy date to be determined after                            pending pathology results are reviewed for                            surveillance based on pathology results.                           - Return to GI clinic in 3 months. Mauri Pole, MD 07/02/2017 2:42:40 PM This report has been signed electronically.

## 2017-07-02 NOTE — Progress Notes (Signed)
Called to room to assist during endoscopic procedure.  Patient ID and intended procedure confirmed with present staff. Received instructions for my participation in the procedure from the performing physician.  

## 2017-07-02 NOTE — Patient Instructions (Signed)
YOU HAD AN ENDOSCOPIC PROCEDURE TODAY AT [facility]:   Refer to the procedure report that was given to you for any specific questions about what was found during the examination.  If the procedure report does not answer your questions, please call your gastroenterologist to clarify.  If you requested that your care partner not be given the details of your procedure findings, then the procedure report has been included in a sealed envelope for you to review at your convenience later.  YOU SHOULD EXPECT: Some feelings of bloating in the abdomen. Passage of more gas than usual.  Walking can help get rid of the air that was put into your GI tract during the procedure and reduce the bloating. If you had a lower endoscopy (such as a colonoscopy or flexible sigmoidoscopy) you may notice spotting of blood in your stool or on the toilet paper. If you underwent a bowel prep for your procedure, you may not have a normal bowel movement for a few days.  Please Note:  You might notice some irritation and congestion in your nose or some drainage.  This is from the oxygen used during your procedure.  There is no need for concern and it should clear up in a day or so.  SYMPTOMS TO REPORT IMMEDIATELY:   Following lower endoscopy (colonoscopy or flexible sigmoidoscopy):  Excessive amounts of blood in the stool  Significant tenderness or worsening of abdominal pains  Swelling of the abdomen that is new, acute  Fever of 100F or higher  For urgent or emergent issues, a gastroenterologist can be reached at any hour by calling [phone].  DIET:  We do recommend a small meal at first, but then you may proceed to your regular diet.  Drink plenty of fluids but you should avoid alcoholic beverages for 24 hours.  ACTIVITY:  You should plan to take it easy for the rest of today and you should NOT DRIVE or use heavy machinery until tomorrow (because of the sedation medicines used during the test).     FOLLOW UP: Our staff will call the number listed on your records the next business day following your procedure to check on you and address any questions or concerns that you may have regarding the information given to you following your procedure. If we do not reach you, we will leave a message.  However, if you are feeling well and you are not experiencing any problems, there is no need to return our call.  We will assume that you have returned to your regular daily activities without incident.  If any biopsies were taken you will be contacted by phone or by letter within the next 1-3 weeks.  Please call us at [phone] if you have not heard about the biopsies in 3 weeks.   SIGNATURES/CONFIDENTIALITY: You and/or your care partner have signed paperwork which will be entered into your electronic medical record.  These signatures attest to the fact that that the information above on your After Visit Summary has been reviewed and is understood.  Full responsibility of the confidentiality of this discharge information lies with you and/or your care-partner.  Await pathology  No Aspirin, Aspirin containing products (BC or Goody powder), or NSAIDS (Ibuprofen, Advil, Aleve, Motrin)  Please read over handouts about diverticulosis and hemorrhoids  Please call Dr. Woodward Ku office in the next few days if you haven't heard from [place] to set up a follow up appointment for 3 months

## 2017-07-02 NOTE — Progress Notes (Signed)
To recovery, report to RN, VSS. 

## 2017-07-05 ENCOUNTER — Telehealth: Payer: Self-pay

## 2017-07-05 NOTE — Telephone Encounter (Signed)
  Follow up Call-  Call back number 07/02/2017 12/06/2015  Post procedure Call Back phone  # [phone]  Permission to leave phone message Yes Yes  Some recent data might be hidden     Patient questions:  Do you have a fever, pain , or abdominal swelling? No. Pain Score  0 *  Have you tolerated food without any problems? Yes.    Have you been able to return to your normal activities? Yes.    Do you have any questions about your discharge instructions: Diet   No. Medications  No. Follow up visit  No.  Do you have questions or concerns about your Care? No.  Actions: * If pain score is 4 or above: No action needed, pain <4.

## 2017-07-12 ENCOUNTER — Encounter: Payer: Self-pay | Admitting: Gastroenterology

## 2017-07-12 DIAGNOSIS — H35371 Puckering of macula, right eye: Secondary | ICD-10-CM | POA: Diagnosis not present

## 2017-07-21 ENCOUNTER — Other Ambulatory Visit: Payer: Self-pay | Admitting: Internal Medicine

## 2017-07-25 ENCOUNTER — Other Ambulatory Visit: Payer: Self-pay | Admitting: Gastroenterology

## 2017-07-30 ENCOUNTER — Other Ambulatory Visit: Payer: Self-pay | Admitting: Internal Medicine

## 2017-08-19 ENCOUNTER — Other Ambulatory Visit: Payer: Self-pay | Admitting: Internal Medicine

## 2017-08-19 ENCOUNTER — Other Ambulatory Visit: Payer: Self-pay | Admitting: Gastroenterology

## 2017-08-21 ENCOUNTER — Other Ambulatory Visit: Payer: Self-pay | Admitting: Internal Medicine

## 2017-10-02 ENCOUNTER — Other Ambulatory Visit: Payer: Self-pay | Admitting: Internal Medicine

## 2017-10-02 ENCOUNTER — Other Ambulatory Visit: Payer: Self-pay | Admitting: Gastroenterology

## 2017-10-21 DIAGNOSIS — H35371 Puckering of macula, right eye: Secondary | ICD-10-CM | POA: Diagnosis not present

## 2017-11-02 ENCOUNTER — Other Ambulatory Visit: Payer: Self-pay | Admitting: Gastroenterology

## 2017-11-11 NOTE — Progress Notes (Signed)
Subjective:    Patient ID: Tammy Kennedy, female    DOB: 05/11/1956, 62 y.o.   MRN: [id number]  HPI She is here for an acute visit.  GERD, nausea/vomiting, diarrhea:  Over the last 6 months or so she has had more frequent heartburn.  This past week she was away for a funeral.  4 days ago she woke up and felt dizzy, nausea, diarrhea and she [place] in the [place] way [place].  She vomited again and was not able to come home until two days ago.  She did have fever/chills.  She was initially not able to eat or drink anything.  Slowly she was able to drink water, but everything she drank hurt.  Yesterday she was able to drink a little milk and that helped.  Tums did not help.  Her entire esophagus hurts.  She denies any abdominal pain.    She has a headache.  She is dizzy, weak and mildly constipated. She does feels heartburn.  She feels like she may have some low grade fever.      Medications and allergies reviewed with patient and updated if appropriate.  Patient Active Problem List   Diagnosis Date Noted  . Idiopathic scoliosis 05/14/2016  . Alcohol abuse 05/14/2016  . Rectovaginal fistula   . C. difficile colitis   . Regional enteritis ([place]) 05/27/2009  . HEPATOMEGALY, HX OF 08/31/2008  . Hypothyroidism 07/30/2008  . Hyperlipidemia 07/30/2008  . Anxiety state 07/30/2008  . Depression 07/30/2008  . RENAL CYST 07/30/2008  . DEGENERATIVE DISC DISEASE 07/30/2008  . Osteopenia 07/30/2008    Current Outpatient Medications on File Prior to Visit  Medication Sig Dispense Refill  . azaTHIOprine (IMURAN) 50 MG tablet TAKE 3 TABLETS BY MOUTH EVERY DAY 90 tablet 0  . calcium-vitamin D (OSCAL WITH D) 500-200 MG-UNIT tablet Take 2 tablets by mouth daily.    . fenofibrate (TRICOR) 145 MG tablet Take 1 tablet (145 mg total) by mouth daily. 90 tablet 3  . folic acid (FOLVITE) 1 MG tablet Take 2 mg by mouth daily.    Marland Kitchen levothyroxine (SYNTHROID, LEVOTHROID) 50 MCG tablet Take 1 tablet  (50 mcg total) daily before breakfast by mouth. -- Office visit needed for further refills 90 tablet 0  . mesalamine (LIALDA) 1.2 g EC tablet Take 2 tablets (2.4 g total) by mouth 2 (two) times daily. 120 tablet 6  . Potassium Gluconate 550 MG TABS Take 2 tablets by mouth daily.     . Sennosides (EX-LAX PO) Take by mouth as directed.    . valACYclovir (VALTREX) 500 MG tablet TAKE 1 TABLET(500 MG) BY MOUTH DAILY 30 tablet 5  . venlafaxine XR (EFFEXOR-XR) 150 MG 24 hr capsule TAKE 1 CAPSULE(150 MG) BY MOUTH DAILY WITH BREAKFAST 30 capsule 5  . venlafaxine XR (EFFEXOR-XR) 75 MG 24 hr capsule TAKE 1 CAPSULE(75 MG) BY MOUTH DAILY WITH BREAKFAST IN ADDITION TO 150 MG DAILY FOR A TOTAL OF 225 MG DAILY 30 capsule 5  . vitamin B-12 (CYANOCOBALAMIN) 500 MCG tablet Take 500 mcg by mouth daily.       No current facility-administered medications on file prior to visit.     Past Medical History:  Diagnosis Date  . Alcohol abuse 05/14/2016  . ALCOHOL ABUSE, HX OF   . ANEMIA   . ANXIETY DISORDER   . C. difficile colitis   . CROHN'S DISEASE   . DEPRESSION   . DRY EYE SYNDROME   . HEPATOMEGALY,  HX OF   . HYPERLIPIDEMIA    intol of statin trials  . HYPOTHYROIDISM   . OSTEOPENIA   . Osteoporosis 05/2017   states Pre osteoporosis 8/18  . Rectovaginal fistula 2012   chron's disease  . Tubular adenoma 2013    Past Surgical History:  Procedure Laterality Date  . ANAL FISSURE REPAIR    . HEMORRHOID SURGERY    . Left thumb surgery  1989  . MOHS SURGERY  2008   Nose BCC  . Theraputic abortion      Social History   Socioeconomic History  . Marital status: Divorced    Spouse name: None  . Number of children: None  . Years of education: None  . Highest education level: None  Social Needs  . Financial resource strain: None  . Food insecurity - worry: None  . Food insecurity - inability: None  . Transportation needs - medical: None  . Transportation needs - non-medical: None  Occupational  History  . None  Tobacco Use  . Smoking status: Never Smoker  . Smokeless tobacco: Never Used  Substance and Sexual Activity  . Alcohol use: Yes    Alcohol/week: 12.0 oz    Types: 20 Glasses of wine per week  . Drug use: No  . Sexual activity: None  Other Topics Concern  . 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 Breast cancer Neg Hx     Review of Systems  Constitutional: Positive for appetite change, chills, fatigue and fever.  HENT: Negative for congestion, ear pain and sore throat.   Respiratory: Negative for cough, shortness of breath and wheezing.   Gastrointestinal: Positive for diarrhea (resolved), nausea and vomiting. Negative for abdominal pain and blood in stool.  Musculoskeletal: Positive for myalgias.  Neurological: Positive for light-headedness and headaches.       Objective:   Vitals:   11/12/17 0752  BP: 118/86  Pulse: 74  Resp: 16  Temp: (!) 97.5 F (36.4 C)  SpO2: 97%   Wt Readings from Last 3 Encounters:  11/12/17 109 lb (49.4 kg)  07/02/17 111 lb (50.3 kg)  05/19/17 110 lb (49.9 kg)   Body mass index is 21.65 kg/m.   Physical Exam    GENERAL APPEARANCE: Appears stated age, well appearing, NAD EYES: conjunctiva clear, no icterus LUNGS: Clear to auscultation without wheeze or crackles, unlabored breathing, good air entry bilaterally CARDIOVASCULAR: Normal S1,S2 without murmurs, no edema Abdomen: Soft, nontender, non-distended SKIN: Warm, dry      Assessment & Plan:    See Problem List for Assessment and Plan of chronic medical problems.

## 2017-11-12 ENCOUNTER — Ambulatory Visit: Payer: BLUE CROSS/BLUE SHIELD | Admitting: Internal Medicine

## 2017-11-12 ENCOUNTER — Encounter: Payer: Self-pay | Admitting: Internal Medicine

## 2017-11-12 VITALS — BP 118/86 | HR 74 | Temp 97.5°F | Resp 16 | Wt 109.0 lb

## 2017-11-12 DIAGNOSIS — K219 Gastro-esophageal reflux disease without esophagitis: Secondary | ICD-10-CM | POA: Diagnosis not present

## 2017-11-12 DIAGNOSIS — K529 Noninfective gastroenteritis and colitis, unspecified: Secondary | ICD-10-CM

## 2017-11-12 HISTORY — PX: EYE SURGERY: SHX253

## 2017-11-12 MED ORDER — PANTOPRAZOLE SODIUM 40 MG PO TBEC: DELAYED_RELEASE_TABLET | ORAL | 3 refills | Status: DC

## 2017-11-12 MED ORDER — SUCRALFATE 1 GM/10ML PO SUSP: 1.0000 g | Freq: Three times a day (TID) | ORAL | 0 refills | Status: DC

## 2017-11-12 NOTE — Assessment & Plan Note (Signed)
Ongoing for 6 months, worse in past few days with gastroenteritis Has esophagitis  Start protonix 40 mg twice daily 1-2 weeks then decrease to 1/day then taper off carafate if covered by insurance - GERD diet - info given  Call if no improvement

## 2017-11-12 NOTE — Patient Instructions (Addendum)
Take the protonix as prescribed - take twice daily for 1-2 weeks and then decrease to once a day.  You can eventually taper off of this if your heartburn has resolved.   Use the carafate liquid if covered by insurance.    Drink as much as tolerated and slowly increase your food intake.   Call if your symptoms do not improve.     Gastroesophageal Reflux Disease, Adult Normally, food travels down the esophagus and stays in the stomach to be digested. However, when a person has gastroesophageal reflux disease (GERD), food and stomach acid move back up into the esophagus. When this happens, the esophagus becomes sore and inflamed. Over time, GERD can create small holes (ulcers) in the lining of the esophagus. What are the causes? This condition is caused by a problem with the muscle between the esophagus and the stomach (lower esophageal sphincter, or LES). Normally, the LES muscle closes after food passes through the esophagus to the stomach. When the LES is weakened or abnormal, it does not close properly, and that allows food and stomach acid to go back up into the esophagus. The LES can be weakened by certain dietary substances, medicines, and medical conditions, including:  Tobacco use.  Pregnancy.  Having a hiatal hernia.  Heavy alcohol use.  Certain foods and beverages, such as coffee, chocolate, onions, and peppermint.  What increases the risk? This condition is more likely to develop in:  People who have an increased body weight.  People who have connective tissue disorders.  People who use NSAID medicines.  What are the signs or symptoms? Symptoms of this condition include:  Heartburn.  Difficult or painful swallowing.  The feeling of having a lump in the throat.  Abitter taste in the mouth.  Bad breath.  Having a large amount of saliva.  Having an upset or bloated stomach.  Belching.  Chest pain.  Shortness of breath or wheezing.  Ongoing (chronic)  cough or a night-time cough.  Wearing away of tooth enamel.  Weight loss.  Different conditions can cause chest pain. Make sure to see your health care provider if you experience chest pain. How is this diagnosed? Your health care provider will take a medical history and perform a physical exam. To determine if you have mild or severe GERD, your health care provider may also monitor how you respond to treatment. You may also have other tests, including:  An endoscopy toexamine your stomach and esophagus with a small camera.  A test thatmeasures the acidity level in your esophagus.  A test thatmeasures how much pressure is on your esophagus.  A barium swallow or modified barium swallow to show the shape, size, and functioning of your esophagus.  How is this treated? The goal of treatment is to help relieve your symptoms and to prevent complications. Treatment for this condition may vary depending on how severe your symptoms are. Your health care provider may recommend:  Changes to your diet.  Medicine.  Surgery.  Follow these instructions at home: Diet  Follow a diet as recommended by your health care provider. This may involve avoiding foods and drinks such as: ? Coffee and tea (with or without caffeine). ? Drinks that containalcohol. ? Energy drinks and sports drinks. ? Carbonated drinks or sodas. ? Chocolate and cocoa. ? Peppermint and mint flavorings. ? Garlic and onions. ? Horseradish. ? Spicy and acidic foods, including peppers, chili powder, curry powder, vinegar, hot sauces, and barbecue sauce. ? Citrus fruit juices and  citrus fruits, such as oranges, lemons, and limes. ? Tomato-based foods, such as red sauce, chili, salsa, and pizza with red sauce. ? Fried and fatty foods, such as donuts, french fries, potato chips, and high-fat dressings. ? High-fat meats, such as hot dogs and fatty cuts of red and white meats, such as rib eye steak, sausage, ham, and  bacon. ? High-fat dairy items, such as whole milk, butter, and cream cheese.  Eat small, frequent meals instead of large meals.  Avoid drinking large amounts of liquid with your meals.  Avoid eating meals during the 2-3 hours before bedtime.  Avoid lying down right after you eat.  Do not exercise right after you eat. General instructions  Pay attention to any changes in your symptoms.  Take over-the-counter and prescription medicines only as told by your health care provider. Do not take aspirin, ibuprofen, or other NSAIDs unless your health care provider told you to do so.  Do not use any tobacco products, including cigarettes, chewing tobacco, and e-cigarettes. If you need help quitting, ask your health care provider.  Wear loose-fitting clothing. Do not wear anything tight around your waist that causes pressure on your abdomen.  Raise (elevate) the head of your bed 6 inches (15cm).  Try to reduce your stress, such as with yoga or meditation. If you need help reducing stress, ask your health care provider.  If you are overweight, reduce your weight to an amount that is healthy for you. Ask your health care provider for guidance about a safe weight loss goal.  Keep all follow-up visits as told by your health care provider. This is important. Contact a health care provider if:  You have new symptoms.  You have unexplained weight loss.  You have difficulty swallowing, or it hurts to swallow.  You have wheezing or a persistent cough.  Your symptoms do not improve with treatment.  You have a hoarse voice. Get help right away if:  You have pain in your arms, neck, jaw, teeth, or back.  You feel sweaty, dizzy, or light-headed.  You have chest pain or shortness of breath.  You vomit and your vomit looks like blood or coffee grounds.  You faint.  Your stool is bloody or black.  You cannot swallow, drink, or eat. This information is not intended to replace advice  given to you by your health care provider. Make sure you discuss any questions you have with your health care provider. Document Released: 07/08/2005 Document Revised: 02/26/2016 Document Reviewed: 01/23/2015 Elsevier Interactive Patient Education  [employer].

## 2017-11-12 NOTE — Assessment & Plan Note (Signed)
Resolving No longer with diarrhea/vomiting ? Viral or food poisoning Symptomatic treatment

## 2017-11-15 DIAGNOSIS — H43813 Vitreous degeneration, bilateral: Secondary | ICD-10-CM | POA: Diagnosis not present

## 2017-11-15 DIAGNOSIS — H3581 Retinal edema: Secondary | ICD-10-CM | POA: Diagnosis not present

## 2017-11-15 DIAGNOSIS — H3562 Retinal hemorrhage, left eye: Secondary | ICD-10-CM | POA: Diagnosis not present

## 2017-11-15 DIAGNOSIS — H35371 Puckering of macula, right eye: Secondary | ICD-10-CM | POA: Diagnosis not present

## 2017-11-16 ENCOUNTER — Telehealth: Payer: Self-pay

## 2017-11-16 NOTE — Telephone Encounter (Signed)
Key: [place] with clinical information.

## 2017-11-18 ENCOUNTER — Telehealth: Payer: Self-pay | Admitting: Internal Medicine

## 2017-11-18 NOTE — Telephone Encounter (Signed)
Copied from [place]. Topic: Quick Communication - Rx Refill/Question >> Nov 18, 2017  4:35 PM Oliver Pila B wrote: Medication: sucralfate (CARAFATE) 1 GM/10ML suspension [[id number]]   [facility] called to state the Rx was denied by insurance, contact pt to advise

## 2017-11-19 NOTE — Telephone Encounter (Signed)
PA was denied for Carafate. Please advise.

## 2017-11-19 NOTE — Telephone Encounter (Signed)
Symptoms likely improved at this time - confirm they are better with her  -- if so no need for further medication

## 2017-11-22 NOTE — Telephone Encounter (Signed)
LVM with pt advising PA was denied and to call back and let us know if her symptoms had gotten better.

## 2017-11-26 DIAGNOSIS — H35371 Puckering of macula, right eye: Secondary | ICD-10-CM | POA: Diagnosis not present

## 2017-11-29 ENCOUNTER — Other Ambulatory Visit: Payer: Self-pay | Admitting: Internal Medicine

## 2017-12-06 DIAGNOSIS — H35371 Puckering of macula, right eye: Secondary | ICD-10-CM | POA: Diagnosis not present

## 2017-12-11 ENCOUNTER — Other Ambulatory Visit: Payer: Self-pay | Admitting: Gastroenterology

## 2017-12-27 DIAGNOSIS — H35371 Puckering of macula, right eye: Secondary | ICD-10-CM | POA: Diagnosis not present

## 2018-01-01 ENCOUNTER — Other Ambulatory Visit: Payer: Self-pay | Admitting: Internal Medicine

## 2018-01-05 ENCOUNTER — Encounter: Payer: Self-pay | Admitting: Internal Medicine

## 2018-01-05 DIAGNOSIS — D1801 Hemangioma of skin and subcutaneous tissue: Secondary | ICD-10-CM | POA: Diagnosis not present

## 2018-01-05 DIAGNOSIS — B078 Other viral warts: Secondary | ICD-10-CM | POA: Diagnosis not present

## 2018-01-05 DIAGNOSIS — L821 Other seborrheic keratosis: Secondary | ICD-10-CM | POA: Diagnosis not present

## 2018-01-05 DIAGNOSIS — D485 Neoplasm of uncertain behavior of skin: Secondary | ICD-10-CM | POA: Diagnosis not present

## 2018-01-10 ENCOUNTER — Encounter: Payer: Self-pay | Admitting: Internal Medicine

## 2018-01-10 DIAGNOSIS — Z85828 Personal history of other malignant neoplasm of skin: Secondary | ICD-10-CM | POA: Insufficient documentation

## 2018-01-10 DIAGNOSIS — C4491 Basal cell carcinoma of skin, unspecified: Secondary | ICD-10-CM

## 2018-01-10 HISTORY — DX: Basal cell carcinoma of skin, unspecified: C44.91

## 2018-02-06 ENCOUNTER — Other Ambulatory Visit: Payer: Self-pay | Admitting: Internal Medicine

## 2018-02-11 ENCOUNTER — Other Ambulatory Visit: Payer: Self-pay | Admitting: Internal Medicine

## 2018-02-11 ENCOUNTER — Other Ambulatory Visit: Payer: Self-pay | Admitting: Gastroenterology

## 2018-02-14 ENCOUNTER — Other Ambulatory Visit: Payer: Self-pay | Admitting: Internal Medicine

## 2018-02-16 ENCOUNTER — Encounter: Payer: Self-pay | Admitting: Gastroenterology

## 2018-02-16 ENCOUNTER — Encounter: Payer: Self-pay | Admitting: Internal Medicine

## 2018-02-24 ENCOUNTER — Other Ambulatory Visit: Payer: Self-pay | Admitting: Internal Medicine

## 2018-03-03 ENCOUNTER — Other Ambulatory Visit: Payer: Self-pay | Admitting: Internal Medicine

## 2018-03-11 ENCOUNTER — Other Ambulatory Visit: Payer: Self-pay | Admitting: Internal Medicine

## 2018-03-16 ENCOUNTER — Other Ambulatory Visit: Payer: Self-pay | Admitting: Internal Medicine

## 2018-03-16 DIAGNOSIS — Z1231 Encounter for screening mammogram for malignant neoplasm of breast: Secondary | ICD-10-CM

## 2018-03-16 DIAGNOSIS — C44612 Basal cell carcinoma of skin of right upper limb, including shoulder: Secondary | ICD-10-CM | POA: Diagnosis not present

## 2018-03-16 DIAGNOSIS — B078 Other viral warts: Secondary | ICD-10-CM | POA: Diagnosis not present

## 2018-03-18 ENCOUNTER — Other Ambulatory Visit: Payer: Self-pay | Admitting: Internal Medicine

## 2018-03-24 DIAGNOSIS — H3562 Retinal hemorrhage, left eye: Secondary | ICD-10-CM | POA: Diagnosis not present

## 2018-03-24 DIAGNOSIS — H2513 Age-related nuclear cataract, bilateral: Secondary | ICD-10-CM | POA: Diagnosis not present

## 2018-03-24 DIAGNOSIS — H43812 Vitreous degeneration, left eye: Secondary | ICD-10-CM | POA: Diagnosis not present

## 2018-04-11 ENCOUNTER — Ambulatory Visit
Admission: RE | Admit: 2018-04-11 | Discharge: 2018-04-11 | Disposition: A | Discharge status: Home / Self Care | Payer: BLUE CROSS/BLUE SHIELD | Source: Ambulatory Visit | Attending: Internal Medicine | Admitting: Internal Medicine

## 2018-04-11 DIAGNOSIS — H2513 Age-related nuclear cataract, bilateral: Secondary | ICD-10-CM | POA: Diagnosis not present

## 2018-04-11 DIAGNOSIS — H2511 Age-related nuclear cataract, right eye: Secondary | ICD-10-CM | POA: Diagnosis not present

## 2018-04-11 DIAGNOSIS — Z1231 Encounter for screening mammogram for malignant neoplasm of breast: Secondary | ICD-10-CM

## 2018-05-11 ENCOUNTER — Encounter: Payer: BLUE CROSS/BLUE SHIELD | Admitting: Internal Medicine

## 2018-05-11 NOTE — Patient Instructions (Addendum)
Test(s) ordered today. Your results will be released to MyChart (or called to you) after review, usually within 72hours after test completion. If any changes need to be made, you will be notified at that same time.  All other Health Maintenance issues reviewed.   All recommended immunizations and age-appropriate screenings are up-to-date or discussed.  No immunizations administered today.   Medications reviewed and updated.  No changes recommended at this time.  Your prescription(s) have been submitted to your pharmacy. Please take as directed and contact our office if you believe you are having problem(s) with the medication(s).  Please followup in 6 months   Health Maintenance, Female Adopting a healthy lifestyle and getting preventive care can go a long way to promote health and wellness. Talk with your health care provider about what schedule of regular examinations is right for you. This is a good chance for you to check in with your provider about disease prevention and staying healthy. In between checkups, there are plenty of things you can do on your own. Experts have done a lot of research about which lifestyle changes and preventive measures are most likely to keep you healthy. Ask your health care provider for more information. Weight and diet Eat a healthy diet  Be sure to include plenty of vegetables, fruits, low-fat dairy products, and lean protein.  Do not eat a lot of foods high in solid fats, added sugars, or salt.  Get regular exercise. This is one of the most important things you can do for your health. ? Most adults should exercise for at least 150 minutes each week. The exercise should increase your heart rate and make you sweat (moderate-intensity exercise). ? Most adults should also do strengthening exercises at least twice a week. This is in addition to the moderate-intensity exercise.  Maintain a healthy weight  Body mass index (BMI) is a measurement that can  be used to identify possible weight problems. It estimates body fat based on height and weight. Your health care provider can help determine your BMI and help you achieve or maintain a healthy weight.  For females 20 years of age and older: ? A BMI below 18.5 is considered underweight. ? A BMI of 18.5 to 24.9 is normal. ? A BMI of 25 to 29.9 is considered overweight. ? A BMI of 30 and above is considered obese.  Watch levels of cholesterol and blood lipids  You should start having your blood tested for lipids and cholesterol at 62 years of age, then have this test every 5 years.  You may need to have your cholesterol levels checked more often if: ? Your lipid or cholesterol levels are high. ? You are older than 62 years of age. ? You are at high risk for heart disease.  Cancer screening Lung Cancer  Lung cancer screening is recommended for adults 55-80 years old who are at high risk for lung cancer because of a history of smoking.  A yearly low-dose CT scan of the lungs is recommended for people who: ? Currently smoke. ? Have quit within the past 15 years. ? Have at least a 30-pack-year history of smoking. A pack year is smoking an average of one pack of cigarettes a day for 1 year.  Yearly screening should continue until it has been 15 years since you quit.  Yearly screening should stop if you develop a health problem that would prevent you from having lung cancer treatment.  Breast Cancer  Practice breast self-awareness. This   means understanding how your breasts normally appear and feel.  It also means doing regular breast self-exams. Let your health care provider know about any changes, no matter how small.  If you are in your 20s or 30s, you should have a clinical breast exam (CBE) by a health care provider every 1-3 years as part of a regular health exam.  If you are 40 or older, have a CBE every year. Also consider having a breast X-ray (mammogram) every year.  If you  have a family history of breast cancer, talk to your health care provider about genetic screening.  If you are at high risk for breast cancer, talk to your health care provider about having an MRI and a mammogram every year.  Breast cancer gene (BRCA) assessment is recommended for women who have family members with BRCA-related cancers. BRCA-related cancers include: ? Breast. ? Ovarian. ? Tubal. ? Peritoneal cancers.  Results of the assessment will determine the need for genetic counseling and BRCA1 and BRCA2 testing.  Cervical Cancer Your health care provider may recommend that you be screened regularly for cancer of the pelvic organs (ovaries, uterus, and vagina). This screening involves a pelvic examination, including checking for microscopic changes to the surface of your cervix (Pap test). You may be encouraged to have this screening done every 3 years, beginning at age 21.  For women ages 30-65, health care providers may recommend pelvic exams and Pap testing every 3 years, or they may recommend the Pap and pelvic exam, combined with testing for human papilloma virus (HPV), every 5 years. Some types of HPV increase your risk of cervical cancer. Testing for HPV may also be done on women of any age with unclear Pap test results.  Other health care providers may not recommend any screening for nonpregnant women who are considered low risk for pelvic cancer and who do not have symptoms. Ask your health care provider if a screening pelvic exam is right for you.  If you have had past treatment for cervical cancer or a condition that could lead to cancer, you need Pap tests and screening for cancer for at least 20 years after your treatment. If Pap tests have been discontinued, your risk factors (such as having a new sexual partner) need to be reassessed to determine if screening should resume. Some women have medical problems that increase the chance of getting cervical cancer. In these cases,  your health care provider may recommend more frequent screening and Pap tests.  Colorectal Cancer  This type of cancer can be detected and often prevented.  Routine colorectal cancer screening usually begins at 62 years of age and continues through 62 years of age.  Your health care provider may recommend screening at an earlier age if you have risk factors for colon cancer.  Your health care provider may also recommend using home test kits to check for hidden blood in the stool.  A small camera at the end of a tube can be used to examine your colon directly (sigmoidoscopy or colonoscopy). This is done to check for the earliest forms of colorectal cancer.  Routine screening usually begins at age 50.  Direct examination of the colon should be repeated every 5-10 years through 62 years of age. However, you may need to be screened more often if early forms of precancerous polyps or small growths are found.  Skin Cancer  Check your skin from head to toe regularly.  Tell your health care provider about any new   any new moles or changes in moles, especially if there is a change in a mole's shape or color.  Also tell your health care provider if you have a mole that is larger than the size of a pencil eraser.  Always use sunscreen. Apply sunscreen liberally and repeatedly throughout the day.  Protect yourself by wearing long sleeves, pants, a wide-brimmed hat, and sunglasses whenever you are outside.  Heart disease, diabetes, and high blood pressure  High blood pressure causes heart disease and increases the risk of stroke. High blood pressure is more likely to develop in: ? People who have blood pressure in the high end of the normal range (130-139/85-89 mm Hg). ? People who are overweight or obese. ? People who are African American.  If you are 38-35 years of age, have your blood pressure checked every 3-5 years. If you are 37 years of age or older, have your blood pressure checked every year.  You should have your blood pressure measured twice-once when you are at a hospital or clinic, and once when you are not at a hospital or clinic. Record the average of the two measurements. To check your blood pressure when you are not at a hospital or clinic, you can use: ? An automated blood pressure machine at a pharmacy. ? A home blood pressure monitor.  If you are between 45 years and 85 years old, ask your health care provider if you should take aspirin to prevent strokes.  Have regular diabetes screenings. This involves taking a blood sample to check your fasting blood sugar level. ? If you are at a normal weight and have a low risk for diabetes, have this test once every three years after 62 years of age. ? If you are overweight and have a high risk for diabetes, consider being tested at a younger age or more often. Preventing infection Hepatitis B  If you have a higher risk for hepatitis B, you should be screened for this virus. You are considered at high risk for hepatitis B if: ? You were born in a country where hepatitis B is common. Ask your health care provider which countries are considered high risk. ? Your parents were born in a high-risk country, and you have not been immunized against hepatitis B (hepatitis B vaccine). ? You have HIV or AIDS. ? You use needles to inject street drugs. ? You live with someone who has hepatitis B. ? You have had sex with someone who has hepatitis B. ? You get hemodialysis treatment. ? You take certain medicines for conditions, including cancer, organ transplantation, and autoimmune conditions.  Hepatitis C  Blood testing is recommended for: ? Everyone born from [birth year] through 1965. ? Anyone with known risk factors for hepatitis C.  Sexually transmitted infections (STIs)  You should be screened for sexually transmitted infections (STIs) including gonorrhea and chlamydia if: ? You are sexually active and are younger than 62 years of  age. ? You are older than 62 years of age and your health care provider tells you that you are at risk for this type of infection. ? Your sexual activity has changed since you were last screened and you are at an increased risk for chlamydia or gonorrhea. Ask your health care provider if you are at risk.  If you do not have HIV, but are at risk, it may be recommended that you take a prescription medicine daily to prevent HIV infection. This is called pre-exposure prophylaxis (PrEP). You are considered  at risk if: ? You are sexually active and do not regularly use condoms or know the HIV status of your partner(s). ? You take drugs by injection. ? You are sexually active with a partner who has HIV.  Talk with your health care provider about whether you are at high risk of being infected with HIV. If you choose to begin PrEP, you should first be tested for HIV. You should then be tested every 3 months for as long as you are taking PrEP. Pregnancy  If you are premenopausal and you may become pregnant, ask your health care provider about preconception counseling.  If you may become pregnant, take 400 to 800 micrograms (mcg) of folic acid every day.  If you want to prevent pregnancy, talk to your health care provider about birth control (contraception). Osteoporosis and menopause  Osteoporosis is a disease in which the bones lose minerals and strength with aging. This can result in serious bone fractures. Your risk for osteoporosis can be identified using a bone density scan.  If you are 6 years of age or older, or if you are at risk for osteoporosis and fractures, ask your health care provider if you should be screened.  Ask your health care provider whether you should take a calcium or vitamin D supplement to lower your risk for osteoporosis.  Menopause may have certain physical symptoms and risks.  Hormone replacement therapy may reduce some of these symptoms and risks. Talk to your health  care provider about whether hormone replacement therapy is right for you. Follow these instructions at home:  Schedule regular health, dental, and eye exams.  Stay current with your immunizations.  Do not use any tobacco products including cigarettes, chewing tobacco, or electronic cigarettes.  If you are pregnant, do not drink alcohol.  If you are breastfeeding, limit how much and how often you drink alcohol.  Limit alcohol intake to no more than 1 drink per day for nonpregnant women. One drink equals 12 ounces of beer, 5 ounces of wine, or 1 ounces of hard liquor.  Do not use street drugs.  Do not share needles.  Ask your health care provider for help if you need support or information about quitting drugs.  Tell your health care provider if you often feel depressed.  Tell your health care provider if you have ever been abused or do not feel safe at home. This information is not intended to replace advice given to you by your health care provider. Make sure you discuss any questions you have with your health care provider. Document Released: 04/13/2011 Document Revised: 03/05/2016 Document Reviewed: 07/02/2015 Elsevier Interactive Patient Education  [employer].

## 2018-05-11 NOTE — Progress Notes (Signed)
Subjective:    Patient ID: Tammy Kennedy, female    DOB: 10-09-1956, 62 y.o.   MRN: [id number]  HPI She is here for a physical exam.   She had a macular pucker and had surgery. She is having cataract surgery in 3 weeks  She has increased stress - her housemate's cancer has returned and he is undergoing treatment.  He was told he has 6-12 months.  She has increased her drinking a little, but not too much - she is vague. She is not drinking daily.    Medications and allergies reviewed with patient and updated if appropriate.  Patient Active Problem List   Diagnosis Date Noted  . Basal cell carcinoma (BCC) 01/10/2018  . Gastroesophageal reflux disease 11/12/2017  . Idiopathic scoliosis 05/14/2016  . Alcohol abuse 05/14/2016  . Rectovaginal fistula   . C. difficile colitis   . Crohn's disease ([place]) 05/27/2009  . HEPATOMEGALY, HX OF 08/31/2008  . Hypothyroidism 07/30/2008  . Hyperlipidemia 07/30/2008  . Anxiety state 07/30/2008  . Depression 07/30/2008  . RENAL CYST 07/30/2008  . DEGENERATIVE DISC DISEASE 07/30/2008  . Osteopenia 07/30/2008    Current Outpatient Medications on File Prior to Visit  Medication Sig Dispense Refill  . azaTHIOprine (IMURAN) 50 MG tablet TAKE 3 TABLETS BY MOUTH EVERY DAY 90 tablet 0  . calcium-vitamin D (OSCAL WITH D) 500-200 MG-UNIT tablet Take 2 tablets by mouth daily.    . fenofibrate (TRICOR) 145 MG tablet Take 1 tablet (145 mg total) by mouth daily. 90 tablet 3  . folic acid (FOLVITE) 1 MG tablet Take 2 mg by mouth daily.    Marland Kitchen levothyroxine (SYNTHROID, LEVOTHROID) 50 MCG tablet TAKE 1 TABLET BY MOUTH EVERY DAY BEFORE BREAKFAST 90 tablet 0  . mesalamine (LIALDA) 1.2 g EC tablet Take 2 tablets (2.4 g total) by mouth 2 (two) times daily. 120 tablet 6  . Potassium Gluconate 550 MG TABS Take 2 tablets by mouth daily.     . Sennosides (EX-LAX PO) Take by mouth as directed.    . valACYclovir (VALTREX) 500 MG tablet TAKE 1 TABLET(500 MG) BY MOUTH  DAILY 30 tablet 5  . venlafaxine XR (EFFEXOR-XR) 150 MG 24 hr capsule TAKE ONE CAPSULE BY MOUTH EVERY MORNING WITH BREAKFAST 30 capsule 1  . venlafaxine XR (EFFEXOR-XR) 75 MG 24 hr capsule TAKE 1 CAPSULE BY MOUTH DAILY WITH BREAKFAST IN ADDITION TO 150 MG DAILY. 30 capsule 2  . vitamin B-12 (CYANOCOBALAMIN) 500 MCG tablet Take 500 mcg by mouth daily.      . sucralfate (CARAFATE) 1 GM/10ML suspension Take 10 mLs (1 g total) by mouth 4 (four) times daily -  with meals and at bedtime. (Patient not taking: Reported on 05/12/2018) 420 mL 0   No current facility-administered medications on file prior to visit.     Past Medical History:  Diagnosis Date  . Alcohol abuse 05/14/2016  . ALCOHOL ABUSE, HX OF   . ANEMIA   . ANXIETY DISORDER   . Basal cell carcinoma (BCC) 01/10/2018  . C. difficile colitis   . CROHN'S DISEASE   . DEPRESSION   . DRY EYE SYNDROME   . HEPATOMEGALY, HX OF   . HYPERLIPIDEMIA    intol of statin trials  . HYPOTHYROIDISM   . OSTEOPENIA   . Osteoporosis 05/2017   states Pre osteoporosis 8/18  . Rectovaginal fistula 2012   chron's disease  . Tubular adenoma 2013    Past Surgical History:  Procedure Laterality  Date  . ANAL FISSURE REPAIR    . HEMORRHOID SURGERY    . Left thumb surgery  1989  . MOHS SURGERY  2008   Nose BCC  . Theraputic abortion      Social History   Socioeconomic History  . Marital status: Divorced    Spouse name: Not on file  . Number of children: Not on file  . Years of education: Not on file  . Highest education level: Not on file  Occupational History  . Not on file  Social Needs  . Financial resource strain: Not on file  . Food insecurity:    Worry: Not on file    Inability: Not on file  . Transportation needs:    Medical: Not on file    Non-medical: Not on file  Tobacco Use  . Smoking status: Never Smoker  . Smokeless tobacco: Never Used  Substance and Sexual Activity  . Alcohol use: Yes    Alcohol/week: 12.0 oz    Types: 20  Glasses of wine per week  . Drug use: No  . Sexual activity: Not on file  Lifestyle  . Physical activity:    Days per week: Not on file    Minutes per session: Not on file  . Stress: Not on file  Relationships  . Social connections:    Talks on phone: Not on file    Gets together: Not on file    Attends religious service: Not on file    Active member of club or organization: Not on file    Attends meetings of clubs or organizations: Not on file    Relationship status: Not on file  Other Topics Concern  . 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 Breast cancer Neg Hx     Review of Systems  Constitutional: Positive for diaphoresis (night sweats more often). Negative for appetite change, chills and fever.       Low energy level  Eyes: Positive for visual disturbance (cataract related).  Respiratory: Negative for cough, shortness of breath and wheezing.   Cardiovascular: Negative for chest pain, palpitations and leg swelling.  Gastrointestinal: Negative for abdominal pain, blood in stool, constipation, diarrhea and nausea.  Genitourinary: Negative for dysuria and hematuria.  Musculoskeletal: Negative for arthralgias.  Skin: Negative for color change and rash.  Neurological: Positive for headaches (intermittent - more lately due to stress). Negative for dizziness and light-headedness.  Psychiatric/Behavioral: Positive for dysphoric mood. Negative for sleep disturbance. The patient is nervous/anxious.        Objective:   Vitals:   05/12/18 0751  BP: 118/66  Pulse: 75  SpO2: 98%   Filed Weights   05/12/18 0751  Weight: 112 lb (50.8 kg)   Body mass index is 22.24 kg/m.  Wt Readings from Last 3 Encounters:  05/12/18 112 lb (50.8 kg)  11/12/17 109 lb (49.4 kg)  07/02/17 111 lb (50.3 kg)     Physical Exam Constitutional: She appears  well-developed and well-nourished. No distress.  HENT:  Head: Normocephalic and atraumatic.  Right Ear: External ear normal. Normal ear canal and TM Left Ear: External ear normal.  Normal ear canal and TM Mouth/Throat: Oropharynx is clear and moist.  Eyes: Conjunctivae and EOM are normal.  Neck: Neck supple. No tracheal deviation present. No thyromegaly present.  No carotid bruit  Cardiovascular: Normal  rate, regular rhythm and normal heart sounds.   No murmur heard.  No edema. Pulmonary/Chest: Effort normal and breath sounds normal. No respiratory distress. She has no wheezes. She has no rales.  Breast: deferred   Abdominal: Soft. She exhibits no distension. There is no tenderness.  Lymphadenopathy: She has no cervical adenopathy.  Skin: Skin is warm and dry. She is not diaphoretic.  Psychiatric: She has a normal mood and affect. Her behavior is normal.        Assessment & Plan:   Physical exam: Screening blood work    ordered Immunizations   Td due,  discussed shingles Colonoscopy    Up to date  Mammogram    Up to date  Gyn   Not seeing gyn at this time Dexa     Up to date  Eye exams   Up to date  EKG   Done 01/2011 Exercise    Some walking - not consistent  Weight  Normal BMI Skin   No concerns, sees derm Substance abuse vague about how much she drinks  See Problem List for Assessment and Plan of chronic medical problems.   FU in 6 months

## 2018-05-12 ENCOUNTER — Ambulatory Visit: Payer: BLUE CROSS/BLUE SHIELD | Admitting: Gastroenterology

## 2018-05-12 ENCOUNTER — Ambulatory Visit (INDEPENDENT_AMBULATORY_CARE_PROVIDER_SITE_OTHER): Payer: BLUE CROSS/BLUE SHIELD | Admitting: Internal Medicine

## 2018-05-12 ENCOUNTER — Encounter: Payer: Self-pay | Admitting: Internal Medicine

## 2018-05-12 ENCOUNTER — Encounter: Payer: Self-pay | Admitting: Gastroenterology

## 2018-05-12 ENCOUNTER — Other Ambulatory Visit (INDEPENDENT_AMBULATORY_CARE_PROVIDER_SITE_OTHER): Payer: BLUE CROSS/BLUE SHIELD

## 2018-05-12 VITALS — BP 118/66 | HR 75 | Ht 59.5 in | Wt 112.0 lb

## 2018-05-12 VITALS — BP 98/70 | HR 66 | Ht 60.5 in | Wt 112.0 lb

## 2018-05-12 DIAGNOSIS — C44612 Basal cell carcinoma of skin of right upper limb, including shoulder: Secondary | ICD-10-CM

## 2018-05-12 DIAGNOSIS — K219 Gastro-esophageal reflux disease without esophagitis: Secondary | ICD-10-CM

## 2018-05-12 DIAGNOSIS — K5080 Crohn's disease of both small and large intestine without complications: Secondary | ICD-10-CM

## 2018-05-12 DIAGNOSIS — K50919 Crohn's disease, unspecified, with unspecified complications: Secondary | ICD-10-CM

## 2018-05-12 DIAGNOSIS — F3289 Other specified depressive episodes: Secondary | ICD-10-CM

## 2018-05-12 DIAGNOSIS — E039 Hypothyroidism, unspecified: Secondary | ICD-10-CM | POA: Diagnosis not present

## 2018-05-12 DIAGNOSIS — M85852 Other specified disorders of bone density and structure, left thigh: Secondary | ICD-10-CM

## 2018-05-12 DIAGNOSIS — E7849 Other hyperlipidemia: Secondary | ICD-10-CM

## 2018-05-12 DIAGNOSIS — F411 Generalized anxiety disorder: Secondary | ICD-10-CM

## 2018-05-12 DIAGNOSIS — F1010 Alcohol abuse, uncomplicated: Secondary | ICD-10-CM

## 2018-05-12 DIAGNOSIS — Z0000 Encounter for general adult medical examination without abnormal findings: Secondary | ICD-10-CM

## 2018-05-12 LAB — CBC WITH DIFFERENTIAL/PLATELET
Basophils Absolute: 0 10*3/uL (ref 0.0–0.1)
Basophils Relative: 1.2 % (ref 0.0–3.0)
EOS PCT: 1.6 % (ref 0.0–5.0)
Eosinophils Absolute: 0.1 10*3/uL (ref 0.0–0.7)
HCT: 32.7 % — ABNORMAL LOW (ref 36.0–46.0)
Hemoglobin: 11.3 g/dL — ABNORMAL LOW (ref 12.0–15.0)
LYMPHS ABS: 0.6 10*3/uL — AB (ref 0.7–4.0)
Lymphocytes Relative: 18.4 % (ref 12.0–46.0)
MCHC: 34.5 g/dL (ref 30.0–36.0)
MONOS PCT: 6.3 % (ref 3.0–12.0)
Monocytes Absolute: 0.2 10*3/uL (ref 0.1–1.0)
NEUTROS PCT: 72.5 % (ref 43.0–77.0)
Neutro Abs: 2.6 10*3/uL (ref 1.4–7.7)
PLATELETS: 265 10*3/uL (ref 150.0–400.0)
RBC: 2.81 Mil/uL — ABNORMAL LOW (ref 3.87–5.11)
RDW: 15.5 % (ref 11.5–15.5)
WBC: 3.5 10*3/uL — ABNORMAL LOW (ref 4.0–10.5)

## 2018-05-12 LAB — LIPID PANEL
CHOL/HDL RATIO: 3
Cholesterol: 222 mg/dL — ABNORMAL HIGH (ref 0–200)
HDL: 82.1 mg/dL (ref >39.00)
LDL Cholesterol: 115 mg/dL — ABNORMAL HIGH (ref 0–99)
NonHDL: 140.05
Triglycerides: 123 mg/dL (ref 0.0–149.0)
VLDL: 24.6 mg/dL (ref 0.0–40.0)

## 2018-05-12 LAB — COMPREHENSIVE METABOLIC PANEL
ALK PHOS: 30 U/L — AB (ref 39–117)
ALT: 6 U/L (ref 0–35)
AST: 17 U/L (ref 0–37)
Albumin: 4.6 g/dL (ref 3.5–5.2)
BUN: 19 mg/dL (ref 6–23)
CHLORIDE: 105 meq/L (ref 96–112)
CO2: 29 meq/L (ref 19–32)
Calcium: 10.4 mg/dL (ref 8.4–10.5)
Creatinine, Ser: 0.88 mg/dL (ref 0.40–1.20)
GFR: 69.21 mL/min (ref >60.00)
GLUCOSE: 89 mg/dL (ref 70–99)
POTASSIUM: 4.6 meq/L (ref 3.5–5.1)
Sodium: 141 mEq/L (ref 135–145)
Total Bilirubin: 0.6 mg/dL (ref 0.2–1.2)
Total Protein: 7.2 g/dL (ref 6.0–8.3)

## 2018-05-12 LAB — FOLATE: Folate: >24.1 ng/mL (ref >5.9)

## 2018-05-12 LAB — FERRITIN: Ferritin: 181.6 ng/mL (ref 10.0–291.0)

## 2018-05-12 LAB — VITAMIN B12: VITAMIN B 12: 651 pg/mL (ref 211–911)

## 2018-05-12 LAB — TSH: TSH: 19.56 u[IU]/mL — AB (ref 0.35–4.50)

## 2018-05-12 LAB — VITAMIN D 25 HYDROXY (VIT D DEFICIENCY, FRACTURES): VITD: 45.94 ng/mL (ref 30.00–100.00)

## 2018-05-12 LAB — C-REACTIVE PROTEIN: CRP: 0.1 mg/dL — ABNORMAL LOW (ref 0.5–20.0)

## 2018-05-12 LAB — T3, FREE: T3 FREE: 3 pg/mL (ref 2.3–4.2)

## 2018-05-12 LAB — T4, FREE: Free T4: 0.77 ng/dL (ref 0.60–1.60)

## 2018-05-12 MED ORDER — AZATHIOPRINE 50 MG PO TABS: 150.0000 mg | ORAL_TABLET | Freq: Every day | ORAL | 2 refills | Status: DC

## 2018-05-12 MED ORDER — MESALAMINE 1.2 G PO TBEC: 2.4000 g | DELAYED_RELEASE_TABLET | Freq: Every day | ORAL | 11 refills | Status: DC

## 2018-05-12 MED ORDER — VALACYCLOVIR HCL 500 MG PO TABS: ORAL_TABLET | ORAL | 5 refills | Status: DC

## 2018-05-12 NOTE — Assessment & Plan Note (Signed)
Fairly controlled Continue current dose of medication

## 2018-05-12 NOTE — Assessment & Plan Note (Signed)
Took protonix briefly - no longer taking No GERD symptoms

## 2018-05-12 NOTE — Assessment & Plan Note (Signed)
Having increased stress - stressed not to compensate with increasing alcohol use Increase walking Continue effexor at current dose

## 2018-05-12 NOTE — Assessment & Plan Note (Signed)
Check lipid panel  Continue daily fenofibrate Regular exercise and healthy diet encouraged

## 2018-05-12 NOTE — Assessment & Plan Note (Signed)
Check tsh, ft4, ft3  Titrate med dose if needed

## 2018-05-12 NOTE — Assessment & Plan Note (Signed)
Sees derm annually

## 2018-05-12 NOTE — Patient Instructions (Signed)
Go to the basement for labs today  We have refilled Imuran for you today  We have sent Lialda 2 tablets once daily into your pharmacy for you  Follow up in 1 year  If you are age 62 or older, your body mass index should be between 23-30. Your Body mass index is 21.51 kg/m. If this is out of the aforementioned range listed, please consider follow up with your Primary Care Provider.  If you are age 75 or younger, your body mass index should be between 19-25. Your Body mass index is 21.51 kg/m. If this is out of the aformentioned range listed, please consider follow up with your Primary Care Provider.    Thank you for choosing [facility]  Karleen Hampshire Nandigam,MD

## 2018-05-12 NOTE — Assessment & Plan Note (Signed)
Has increased alcohol use - states she is not drinking daily but is vague about how much she uses Discussed increasing exercise for stress and to avoid etoh.

## 2018-05-12 NOTE — Assessment & Plan Note (Signed)
Controlled Sees dr Silverio Decamp today

## 2018-05-12 NOTE — Assessment & Plan Note (Signed)
dexa up to date encouraged regular walking Dec alcohol use Ck vitamin d level

## 2018-05-12 NOTE — Progress Notes (Signed)
Tammy Kennedy    [id number]    May 11, 1956  Primary Care Physician:Burns, Claudina Lick, MD  Referring Physician: Binnie Rail, MD [place], [place] [id number]  Chief complaint: IBD  HPI: 62 year old female with history of Crohn's disease complicated by rectovaginal fistula in clinical remission on Imuran here for follow-up visit. Patient denies any nausea, vomiting, abdominal pain, melena or blood per rectum.  She is currently taking Imuran 150 mg daily and Lialda 2 tablets daily. She had an episode of severe heartburn in February 2019, Dr. Quay Burow prescribed Protonix, took it only for a week as it caused severe constipation.  She is no longer having persistent heartburn and is taking over-the-counter Tums as needed Colonoscopy September 2018 with findings of colitis in remission, random biopsies negative for active inflammation  Outpatient Encounter Medications as of 05/12/2018  Medication Sig  . azaTHIOprine (IMURAN) 50 MG tablet TAKE 3 TABLETS BY MOUTH EVERY DAY  . calcium-vitamin D (OSCAL WITH D) 500-200 MG-UNIT tablet Take 2 tablets by mouth daily.  . fenofibrate (TRICOR) 145 MG tablet Take 1 tablet (145 mg total) by mouth daily.  . folic acid (FOLVITE) 1 MG tablet Take 2 mg by mouth daily.  Marland Kitchen levothyroxine (SYNTHROID, LEVOTHROID) 50 MCG tablet TAKE 1 TABLET BY MOUTH EVERY DAY BEFORE BREAKFAST  . mesalamine (LIALDA) 1.2 g EC tablet Take 2 tablets (2.4 g total) by mouth 2 (two) times daily. (Patient taking differently: Take 2.4 g by mouth daily. )  . Potassium Gluconate 550 MG TABS Take 2 tablets by mouth daily.   . Sennosides (EX-LAX PO) Take by mouth as directed.  . sucralfate (CARAFATE) 1 GM/10ML suspension Take 10 mLs (1 g total) by mouth 4 (four) times daily -  with meals and at bedtime.  . valACYclovir (VALTREX) 500 MG tablet TAKE 1 TABLET(500 MG) BY MOUTH DAILY  . venlafaxine XR (EFFEXOR-XR) 150 MG 24 hr capsule TAKE ONE CAPSULE BY MOUTH EVERY MORNING WITH  BREAKFAST  . venlafaxine XR (EFFEXOR-XR) 75 MG 24 hr capsule TAKE 1 CAPSULE BY MOUTH DAILY WITH BREAKFAST IN ADDITION TO 150 MG DAILY.  . vitamin B-12 (CYANOCOBALAMIN) 500 MCG tablet Take 500 mcg by mouth daily.     No facility-administered encounter medications on file as of 05/12/2018.     Allergies as of 05/12/2018  . (No Known Allergies)    Past Medical History:  Diagnosis Date  . Alcohol abuse 05/14/2016  . ALCOHOL ABUSE, HX OF   . ANEMIA   . ANXIETY DISORDER   . Basal cell carcinoma (BCC) 01/10/2018  . C. difficile colitis   . CROHN'S DISEASE   . DEPRESSION   . DRY EYE SYNDROME   . HEPATOMEGALY, HX OF   . HYPERLIPIDEMIA    intol of statin trials  . HYPOTHYROIDISM   . OSTEOPENIA   . Osteoporosis 05/2017   states Pre osteoporosis 8/18  . Rectovaginal fistula 2012   chron's disease  . Tubular adenoma 2013    Past Surgical History:  Procedure Laterality Date  . ANAL FISSURE REPAIR    . EYE SURGERY  11/2017   macular pucker  . HEMORRHOID SURGERY    . Left thumb surgery  1989  . MOHS SURGERY  2008   Nose BCC  . Theraputic abortion      Family History  Adopted: Yes  Problem Relation Age of Onset  . Breast cancer Neg Hx     Social  History   Socioeconomic History  . Marital status: Divorced    Spouse name: Not on file  . Number of children: Not on file  . Years of education: Not on file  . Highest education level: Not on file  Occupational History  . Not on file  Social Needs  . Financial resource strain: Not on file  . Food insecurity:    Worry: Not on file    Inability: Not on file  . Transportation needs:    Medical: Not on file    Non-medical: Not on file  Tobacco Use  . Smoking status: Never Smoker  . Smokeless tobacco: Never Used  Substance and Sexual Activity  . Alcohol use: Yes    Comment: has drank excessively, not drinking daily  . Drug use: No  . Sexual activity: Not on file  Lifestyle  . Physical activity:    Days per week: Not on  file    Minutes per session: Not on file  . Stress: Not on file  Relationships  . Social connections:    Talks on phone: Not on file    Gets together: Not on file    Attends religious service: Not on file    Active member of club or organization: Not on file    Attends meetings of clubs or organizations: Not on file    Relationship status: Not on file  . Intimate partner violence:    Fear of current or ex partner: Not on file    Emotionally abused: Not on file    Physically abused: Not on file    Forced sexual activity: Not on file  Other Topics Concern  . Not on file  Social History Narrative   Works at [employer] [place]- [employer]- part time      Review of systems: Review of Systems  Constitutional: Negative for fever and chills.  Positive for lack of energy HENT: Negative.   Eyes: Negative for blurred vision.  Respiratory: Negative for cough, shortness of breath and wheezing.   Cardiovascular: Negative for chest pain and palpitations.  Gastrointestinal: as per HPI Genitourinary: Negative for dysuria, urgency, frequency and hematuria.  Musculoskeletal: Negative for myalgias, back pain and joint pain.  Skin: Negative for itching and rash.  Had skin precancerous lesions removed Neurological: Negative for dizziness, tremors, focal weakness, seizures and loss of consciousness.  Endo/Heme/Allergies: Negative for seasonal allergies.  Psychiatric/Behavioral: Negative for depression, suicidal ideas and hallucinations.  Positive for depression and anxiety All other systems reviewed and are negative.   Physical Exam: Vitals:   05/12/18 0832  BP: 98/70  Pulse: 66   Body mass index is 21.51 kg/m. Gen:      No acute distress HEENT:  EOMI, sclera anicteric Neck:     No masses; no thyromegaly Lungs:    Clear to auscultation bilaterally; normal respiratory effort CV:         Regular rate and rhythm; no murmurs Abd:      + bowel sounds; soft, non-tender; no  palpable masses, no distension Ext:    No edema; adequate peripheral perfusion Skin:      Warm and dry; no rash Neuro: alert and oriented x 3 Psych: normal mood and affect  Data Reviewed:  Reviewed labs, radiology imaging, old records and pertinent past GI work up   Assessment and Plan/Recommendations:  62 year old female with history of Crohn's disease with involvement of terminal ileum and colon.  History of colovaginal fistula.  Currently in remission on Imuran Continue  Imuran 150 mg daily and decrease Lialda 2 tablets daily Patient has labs requested by Dr. Quay Burow for CBC, CMP, lipid panel, vitamin D and B12 Check CRP, folate and ferritin She follows with dermatology and dentist routinely Bone density scan July 2018, due for surveillance in July 2020 Colorectal cancer screening: High risk with history of long-standing pancolonic IBD Due for surveillance colonoscopy September 2020    K. Denzil Magnuson , MD [phone]    CC: Binnie Rail, MD

## 2018-05-17 ENCOUNTER — Other Ambulatory Visit: Payer: Self-pay | Admitting: Emergency Medicine

## 2018-05-17 DIAGNOSIS — E039 Hypothyroidism, unspecified: Secondary | ICD-10-CM

## 2018-05-17 MED ORDER — LEVOTHYROXINE SODIUM 88 MCG PO TABS: 88.0000 ug | ORAL_TABLET | Freq: Every day | ORAL | 0 refills | Status: DC

## 2018-05-18 ENCOUNTER — Encounter: Payer: Self-pay | Admitting: Gastroenterology

## 2018-05-23 ENCOUNTER — Other Ambulatory Visit: Payer: Self-pay | Admitting: Internal Medicine

## 2018-06-02 DIAGNOSIS — H2511 Age-related nuclear cataract, right eye: Secondary | ICD-10-CM | POA: Diagnosis not present

## 2018-06-16 ENCOUNTER — Other Ambulatory Visit: Payer: Self-pay | Admitting: Internal Medicine

## 2018-06-25 ENCOUNTER — Other Ambulatory Visit: Payer: Self-pay | Admitting: Internal Medicine

## 2018-07-31 ENCOUNTER — Other Ambulatory Visit: Payer: Self-pay | Admitting: Internal Medicine

## 2018-08-30 ENCOUNTER — Other Ambulatory Visit: Payer: Self-pay | Admitting: Internal Medicine

## 2018-09-11 ENCOUNTER — Other Ambulatory Visit: Payer: Self-pay | Admitting: Gastroenterology

## 2018-09-18 ENCOUNTER — Other Ambulatory Visit: Payer: Self-pay | Admitting: Internal Medicine

## 2018-10-08 ENCOUNTER — Other Ambulatory Visit: Payer: Self-pay | Admitting: Gastroenterology

## 2018-10-13 DIAGNOSIS — H43812 Vitreous degeneration, left eye: Secondary | ICD-10-CM | POA: Diagnosis not present

## 2018-11-10 NOTE — Progress Notes (Signed)
Subjective:    Patient ID: Tammy Kennedy, female    DOB: 1956-07-02, 63 y.o.   MRN: [id number]  HPI The patient is here for an acute visit.  Right elbow pain since mid November:  She is having lateral elbow pain that started after lifting a table.  She denies N/T in the arm.  She tries not to use the arm because trying to lift something is painful and she feels like her arm will not be able to lift it,  Twisting her arm, sleeping wrong on the arm and lifting anything increases the pain.  She has taken advil and it has not helped much.  The pain has not changed since Nov.  Some days she has no pain.  The pain does not radiate down the forearm.    Headache:  She has had a headache in the posterior left skull intermittently and had this three days ago.  She currently does not have pain.  It is typically a constant ache while awake.  OTC pain medications have not helped.  It goes away with sleep, but it comes back.  She has had this in the past with stress.  She knows this is stress.   It does affect her ability to function.  She has tried pressure points.  She wondered what else she can do for it.   Medications and allergies reviewed with patient and updated if appropriate.  Patient Active Problem List   Diagnosis Date Noted  . Basal cell carcinoma (BCC) 01/10/2018  . Gastroesophageal reflux disease 11/12/2017  . Idiopathic scoliosis 05/14/2016  . Alcohol abuse 05/14/2016  . Rectovaginal fistula   . C. difficile colitis   . Crohn's disease ([place]) 05/27/2009  . HEPATOMEGALY, HX OF 08/31/2008  . Hypothyroidism 07/30/2008  . Hyperlipidemia 07/30/2008  . Anxiety state 07/30/2008  . Depression 07/30/2008  . RENAL CYST 07/30/2008  . DEGENERATIVE DISC DISEASE 07/30/2008  . Osteopenia 07/30/2008    Current Outpatient Medications on File Prior to Visit  Medication Sig Dispense Refill  . azaTHIOprine (IMURAN) 50 MG tablet TAKE 3 TABLETS(150 MG) BY MOUTH DAILY 90 tablet 0  .  calcium-vitamin D (OSCAL WITH D) 500-200 MG-UNIT tablet Take 2 tablets by mouth daily.    . fenofibrate (TRICOR) 145 MG tablet TAKE 1 TABLET(145 MG) BY MOUTH DAILY 90 tablet 2  . folic acid (FOLVITE) 1 MG tablet Take 2 mg by mouth daily.    Marland Kitchen levothyroxine (SYNTHROID, LEVOTHROID) 88 MCG tablet TAKE 1 TABLET(88 MCG) BY MOUTH DAILY 90 tablet 2  . mesalamine (LIALDA) 1.2 g EC tablet Take 2 tablets (2.4 g total) by mouth daily. 60 tablet 11  . Potassium Gluconate 550 MG TABS Take 2 tablets by mouth daily.     . Sennosides (EX-LAX PO) Take by mouth as directed.    . valACYclovir (VALTREX) 500 MG tablet TAKE 1 TABLET(500 MG) BY MOUTH DAILY 30 tablet 5  . venlafaxine XR (EFFEXOR-XR) 150 MG 24 hr capsule TAKE ONE CAPSULE BY MOUTH EVERY MORNING WITH BREAKFAST 30 capsule 2  . venlafaxine XR (EFFEXOR-XR) 75 MG 24 hr capsule TAKE 1 CAPSULE BY MOUTH DAILY WITH BREAKFAST IN ADDITION TO 150 MG DAILY 30 capsule 4  . vitamin B-12 (CYANOCOBALAMIN) 500 MCG tablet Take 500 mcg by mouth daily.       No current facility-administered medications on file prior to visit.     Past Medical History:  Diagnosis Date  . Alcohol abuse 05/14/2016  . ALCOHOL ABUSE, HX  OF   . ANEMIA   . ANXIETY DISORDER   . Basal cell carcinoma (BCC) 01/10/2018  . C. difficile colitis   . CROHN'S DISEASE   . DEPRESSION   . DRY EYE SYNDROME   . HEPATOMEGALY, HX OF   . HYPERLIPIDEMIA    intol of statin trials  . HYPOTHYROIDISM   . OSTEOPENIA   . Osteoporosis 05/2017   states Pre osteoporosis 8/18  . Rectovaginal fistula 2012   chron's disease  . Tubular adenoma 2013    Past Surgical History:  Procedure Laterality Date  . ANAL FISSURE REPAIR    . EYE SURGERY  11/2017   macular pucker  . HEMORRHOID SURGERY    . Left thumb surgery  1989  . MOHS SURGERY  2008   Nose BCC  . Theraputic abortion      Social History   Socioeconomic History  . Marital status: Divorced    Spouse name: Not on file  . Number of children: Not on  file  . Years of education: Not on file  . Highest education level: Not on file  Occupational History  . Not on file  Social Needs  . Financial resource strain: Not on file  . Food insecurity:    Worry: Not on file    Inability: Not on file  . Transportation needs:    Medical: Not on file    Non-medical: Not on file  Tobacco Use  . Smoking status: Never Smoker  . Smokeless tobacco: Never Used  Substance and Sexual Activity  . Alcohol use: Yes    Comment: has drank excessively, not drinking daily  . Drug use: No  . Sexual activity: Not on file  Lifestyle  . Physical activity:    Days per week: Not on file    Minutes per session: Not on file  . Stress: Not on file  Relationships  . Social connections:    Talks on phone: Not on file    Gets together: Not on file    Attends religious service: Not on file    Active member of club or organization: Not on file    Attends meetings of clubs or organizations: Not on file    Relationship status: Not on file  Other Topics Concern  . 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 Breast cancer Neg Hx     Review of Systems Per HPI    Objective:   Vitals:   11/11/18 0757  BP: 122/78  Pulse: 83  Resp: 16  Temp: 98.4 F (36.9 C)  SpO2: 99%   BP Readings from Last 3 Encounters:  11/11/18 122/78  05/12/18 98/70  05/12/18 118/66   Wt Readings from Last 3 Encounters:  11/11/18 111 lb 1.9 oz (50.4 kg)  05/12/18 112 lb (50.8 kg)  05/12/18 112 lb (50.8 kg)   Body mass index is 21.34 kg/m.   Physical Exam    A Right Elbow exam was performed.   SWELLING: none  EFFUSION: no  WARMTH: no warmth  TENDERNESS: tenderness on lateral epicondyl only - no pain elsewhere in elbow joint or forearm ROM: full ROM without pain NEUROLOGICAL EXAM: normal sensation and strength  PULSES: normal   Neck,  Head: atraumatic, normocephalic.  No tenderness with palpation posterior neck or skull     Assessment & Plan:  See Problem List for Assessment and Plan of chronic medical problems.

## 2018-11-11 ENCOUNTER — Ambulatory Visit (INDEPENDENT_AMBULATORY_CARE_PROVIDER_SITE_OTHER): Payer: BLUE CROSS/BLUE SHIELD | Admitting: Internal Medicine

## 2018-11-11 ENCOUNTER — Ambulatory Visit (INDEPENDENT_AMBULATORY_CARE_PROVIDER_SITE_OTHER)
Admission: RE | Admit: 2018-11-11 | Discharge: 2018-11-11 | Disposition: A | Discharge status: Home / Self Care | Payer: BLUE CROSS/BLUE SHIELD | Source: Ambulatory Visit | Attending: Internal Medicine | Admitting: Internal Medicine

## 2018-11-11 ENCOUNTER — Encounter: Payer: Self-pay | Admitting: Internal Medicine

## 2018-11-11 VITALS — BP 122/78 | HR 83 | Temp 98.4°F | Resp 16 | Ht 60.5 in | Wt 111.1 lb

## 2018-11-11 DIAGNOSIS — M25521 Pain in right elbow: Secondary | ICD-10-CM | POA: Diagnosis not present

## 2018-11-11 DIAGNOSIS — M7711 Lateral epicondylitis, right elbow: Secondary | ICD-10-CM

## 2018-11-11 DIAGNOSIS — G44209 Tension-type headache, unspecified, not intractable: Secondary | ICD-10-CM | POA: Insufficient documentation

## 2018-11-11 NOTE — Assessment & Plan Note (Signed)
Focal pain on lateral epicondyl only ? Tendinitis, vs avulsion fx Will get xray  Refer to sports med advil prn, ice

## 2018-11-11 NOTE — Patient Instructions (Addendum)
Have an x-ray today.    Schedule an appointment with Sports medicine.    Continue advil as needed, ice as needed.

## 2018-11-11 NOTE — Assessment & Plan Note (Signed)
Intermittent with stress - no current pain Headache is posterior skull - may be related to muscle tension in neck Has tried several things - muscle relaxer, nsaid, tylenol Can consider combination of muscle relaxer, steroid and rest when recurs

## 2018-11-12 ENCOUNTER — Encounter: Payer: Self-pay | Admitting: Internal Medicine

## 2018-11-18 ENCOUNTER — Other Ambulatory Visit: Payer: Self-pay | Admitting: Internal Medicine

## 2018-11-20 ENCOUNTER — Other Ambulatory Visit: Payer: Self-pay | Admitting: Internal Medicine

## 2018-11-20 ENCOUNTER — Encounter: Payer: Self-pay | Admitting: Internal Medicine

## 2018-11-24 ENCOUNTER — Encounter: Payer: Self-pay | Admitting: Family Medicine

## 2018-11-24 ENCOUNTER — Ambulatory Visit: Payer: BLUE CROSS/BLUE SHIELD | Admitting: Family Medicine

## 2018-11-24 ENCOUNTER — Ambulatory Visit: Payer: Self-pay

## 2018-11-24 VITALS — BP 102/74 | HR 99 | Ht 60.0 in | Wt 111.0 lb

## 2018-11-24 DIAGNOSIS — M25521 Pain in right elbow: Secondary | ICD-10-CM

## 2018-11-24 DIAGNOSIS — M7711 Lateral epicondylitis, right elbow: Secondary | ICD-10-CM

## 2018-11-24 MED ORDER — NITROGLYCERIN 0.2 MG/HR TD PT24: MEDICATED_PATCH | TRANSDERMAL | 1 refills | Status: DC

## 2018-11-24 NOTE — Assessment & Plan Note (Signed)
Noted severely on ultrasound.  We discussed icing regimen and home exercise.  Discussed which activities to do which also avoid.  Increase activity slowly over the course the next several days.  Discussed icing regimen.  Discussed with patient about avoiding significant amount of lifting.  While in ultrasound there is a potential for a small cystic mass and I would like to further look at it at follow-up.  Patient is agreement with this.  If any enlargement of the cyst or any increasing vascularity I do feel that we will need an MRI.  Patient is agreement with the plan.

## 2018-11-24 NOTE — Patient Instructions (Addendum)
Ice 20 minutes 2 times daily. Usually after activity and before bed. Wear brace day and night for 2 weeks then nightly for 2 weeks No overhand lifting at all - only underhand or thumbs up  Exercises 3 times a week.  Nitroglycerin Protocol   Apply 1/4 nitroglycerin patch to affected area daily.  Change position of patch within the affected area every 24 hours.  You may experience a headache during the first 1-2 weeks of using the patch, these should subside.  If you experience headaches after beginning nitroglycerin patch treatment, you may take your preferred over the counter pain reliever.  Another side effect of the nitroglycerin patch is skin irritation or rash related to patch adhesive.  Please notify our office if you develop more severe headaches or rash, and stop the patch.  Tendon healing with nitroglycerin patch may require 12 to 24 weeks depending on the extent of injury.  Men should not use if taking Viagra, Cialis, or Levitra.   Do not use if you have migraines or rosacea. See me again in 3 weeks

## 2018-11-24 NOTE — Progress Notes (Signed)
[facility] [address], [place] [address] Phone: [phone] Subjective:   [name], am serving as a scribe for Dr. Hulan Saas.   CC: Right elbow pain  [id number]  Tammy Kennedy is a 63 y.o. female coming in with complaint of right elbow pain. Lifted a heavy table in November 2019. Pain has been intermittent. Pain increases with picking up heavy items or using it excessively. Pain is located over lateral epicondyle. Denies any numbness or tingling in her hand. Xray performed on 11/11/2018.        Past Medical History:  Diagnosis Date  . Alcohol abuse 05/14/2016  . ALCOHOL ABUSE, HX OF   . ANEMIA   . ANXIETY DISORDER   . Basal cell carcinoma (BCC) 01/10/2018  . C. difficile colitis   . CROHN'S DISEASE   . DEPRESSION   . DRY EYE SYNDROME   . HEPATOMEGALY, HX OF   . HYPERLIPIDEMIA    intol of statin trials  . HYPOTHYROIDISM   . OSTEOPENIA   . Osteoporosis 05/2017   states Pre osteoporosis 8/18  . Rectovaginal fistula 2012   chron's disease  . Tubular adenoma 2013   Past Surgical History:  Procedure Laterality Date  . ANAL FISSURE REPAIR    . EYE SURGERY  11/2017   macular pucker  . HEMORRHOID SURGERY    . Left thumb surgery  1989  . MOHS SURGERY  2008   Nose BCC  . Theraputic abortion     Social History   Socioeconomic History  . Marital status: Divorced    Spouse name: Not on file  . Number of children: Not on file  . Years of education: Not on file  . Highest education level: Not on file  Occupational History  . Not on file  Social Needs  . Financial resource strain: Not on file  . Food insecurity:    Worry: Not on file    Inability: Not on file  . Transportation needs:    Medical: Not on file    Non-medical: Not on file  Tobacco Use  . Smoking status: Never Smoker  . Smokeless tobacco: Never Used  Substance and Sexual Activity  . Alcohol use: Yes    Comment: has drank excessively, not drinking  daily  . Drug use: No  . Sexual activity: Not on file  Lifestyle  . Physical activity:    Days per week: Not on file    Minutes per session: Not on file  . Stress: Not on file  Relationships  . Social connections:    Talks on phone: Not on file    Gets together: Not on file    Attends religious service: Not on file    Active member of club or organization: Not on file    Attends meetings of clubs or organizations: Not on file    Relationship status: Not on file  Other Topics Concern  . 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 Breast cancer Neg Hx     Current Outpatient Medications (Endocrine & Metabolic):  .  levothyroxine (SYNTHROID, LEVOTHROID) 88 MCG tablet, TAKE 1 TABLET(88 MCG) BY MOUTH DAILY  Current Outpatient Medications (Cardiovascular):  .  fenofibrate (TRICOR) 145 MG tablet, TAKE 1 TABLET(145 MG) BY MOUTH DAILY .  nitroGLYCERIN (NITRODUR - DOSED  IN MG/24 HR) 0.2 mg/hr patch, 1/4 patch daily    Current Outpatient Medications (Hematological):  .  folic acid (FOLVITE) 1 MG tablet, Take 2 mg by mouth daily. .  vitamin B-12 (CYANOCOBALAMIN) 500 MCG tablet, Take 500 mcg by mouth daily.    Current Outpatient Medications (Other):  .  azaTHIOprine (IMURAN) 50 MG tablet, TAKE 3 TABLETS(150 MG) BY MOUTH DAILY .  calcium-vitamin D (OSCAL WITH D) 500-200 MG-UNIT tablet, Take 2 tablets by mouth daily. .  mesalamine (LIALDA) 1.2 g EC tablet, Take 2 tablets (2.4 g total) by mouth daily. .  Potassium Gluconate 550 MG TABS, Take 2 tablets by mouth daily.  .  Sennosides (EX-LAX PO), Take by mouth as directed. .  valACYclovir (VALTREX) 500 MG tablet, TAKE 1 TABLET(500 MG) BY MOUTH DAILY .  venlafaxine XR (EFFEXOR-XR) 150 MG 24 hr capsule, Take 1 capsule by mouth every morning with breakfast. Need office visit for more refills. .   venlafaxine XR (EFFEXOR-XR) 75 MG 24 hr capsule, TAKE 1 CAPSULE BY MOUTH DAILY WITH BREAKFAST IN ADDITION TO 150 MG DAILY. Need office visit for more refills.    Past medical history, social, surgical and family history all reviewed in electronic medical record.  No pertanent information unless stated regarding to the chief complaint.   Review of Systems:  No headache, visual changes, nausea, vomiting, diarrhea, constipation, dizziness, abdominal pain, skin rash, fevers, chills, night sweats, weight loss, swollen lymph nodes, body aches, joint swelling,  chest pain, shortness of breath, mood changes.  Positive muscle aches  Objective  Blood pressure 102/74, pulse 99, height 5' (1.524 m), weight 111 lb (50.3 kg), last menstrual period 10/12/2005, SpO2 98 %.    General: No apparent distress alert and oriented x3 mood and affect normal, dressed appropriately.  HEENT: Pupils equal, extraocular movements intact  Respiratory: Patient's speak in full sentences and does not appear short of breath  Cardiovascular: No lower extremity edema, non tender, no erythema  Skin: Warm dry intact with no signs of infection or rash on extremities or on axial skeleton.  Abdomen: Soft nontender  Neuro: Cranial nerves II through XII are intact, neurovascularly intact in all extremities with 2+ DTRs and 2+ pulses.  Lymph: No lymphadenopathy of posterior or anterior cervical chain or axillae bilaterally.  Gait normal with good balance and coordination.  MSK:  Non tender with full range of motion and good stability and symmetric strength and tone of shoulders,  wrist, hip, knee and ankles bilaterally.  Elbow: Right Unremarkable to inspection. Range of motion full pronation, supination, flexion, extension. Strength is full to all of the above directions Stable to varus, valgus stress. Negative moving valgus stress test. Very tenderness noted over the lateral epicondylar region Ulnar nerve does not sublux. Negative  cubital tunnel Tinel's. Contralateral elbow unremarkable  Musculoskeletal ultrasound was performed and interpreted by Charlann Boxer D.O.   Elbow:  Lateral epicondyle and common extensor tendon noted with significant amount of tearing.  Possible cystic mass noted.  Appears to have some vascularity surrounding the area but does not seem to be coming from mass.  IMPRESSION: Intrasubstance tearing of the common extensor tendon    Impression and Recommendations:     This case required medical decision making of moderate complexity. The above documentation has been reviewed and is accurate and complete Lyndal Pulley, DO       Note: This dictation was prepared with Dragon dictation along with smaller phrase technology. Any transcriptional errors that result from  this process are unintentional.

## 2018-12-04 ENCOUNTER — Other Ambulatory Visit: Payer: Self-pay | Admitting: Gastroenterology

## 2018-12-14 ENCOUNTER — Encounter: Payer: Self-pay | Admitting: Family Medicine

## 2018-12-14 ENCOUNTER — Ambulatory Visit: Payer: BLUE CROSS/BLUE SHIELD | Admitting: Family Medicine

## 2018-12-14 DIAGNOSIS — M7711 Lateral epicondylitis, right elbow: Secondary | ICD-10-CM | POA: Diagnosis not present

## 2018-12-14 NOTE — Progress Notes (Signed)
[name] [address], [place] [address] Phone: [phone] Subjective:   I Tammy Kennedy am serving as a Education administrator for Dr. Hulan Saas.     CC: Elbow pain follow-up  [id number]   11/24/2018 Noted severely on ultrasound.  We discussed icing regimen and home exercise.  Discussed which activities to do which also avoid.  Increase activity slowly over the course the next several days.  Discussed icing regimen.  Discussed with patient about avoiding significant amount of lifting.  While in ultrasound there is a potential for a small cystic mass and I would like to further look at it at follow-up.  Patient is agreement with this.  If any enlargement of the cyst or any increasing vascularity I do feel that we will need an MRI.  Patient is agreement with the plan.  Updated /01/2019 Tammy Kennedy is a 63 y.o. female coming in with complaint of elbow pain. States the elbow is doing better. Patient's right elbow is doing about 50 to 60% better.  No longer wearing the wrist brace.  Continue with nitroglycerin with minimal headaches.  Patient denies any numbness.  States that the pain is 60% better as well as the range of motion.  No nighttime awakenings.     Past Medical History:  Diagnosis Date  . Alcohol abuse 05/14/2016  . ALCOHOL ABUSE, HX OF   . ANEMIA   . ANXIETY DISORDER   . Basal cell carcinoma (BCC) 01/10/2018  . C. difficile colitis   . CROHN'S DISEASE   . DEPRESSION   . DRY EYE SYNDROME   . HEPATOMEGALY, HX OF   . HYPERLIPIDEMIA    intol of statin trials  . HYPOTHYROIDISM   . OSTEOPENIA   . Osteoporosis 05/2017   states Pre osteoporosis 8/18  . Rectovaginal fistula 2012   chron's disease  . Tubular adenoma 2013   Past Surgical History:  Procedure Laterality Date  . ANAL FISSURE REPAIR    . EYE SURGERY  11/2017   macular pucker  . HEMORRHOID SURGERY    . Left thumb surgery  1989  . MOHS SURGERY  2008   Nose BCC  . Theraputic  abortion     Social History   Socioeconomic History  . Marital status: Divorced    Spouse name: Not on file  . Number of children: Not on file  . Years of education: Not on file  . Highest education level: Not on file  Occupational History  . Not on file  Social Needs  . Financial resource strain: Not on file  . Food insecurity:    Worry: Not on file    Inability: Not on file  . Transportation needs:    Medical: Not on file    Non-medical: Not on file  Tobacco Use  . Smoking status: Never Smoker  . Smokeless tobacco: Never Used  Substance and Sexual Activity  . Alcohol use: Yes    Comment: has drank excessively, not drinking daily  . Drug use: No  . Sexual activity: Not on file  Lifestyle  . Physical activity:    Days per week: Not on file    Minutes per session: Not on file  . Stress: Not on file  Relationships  . Social connections:    Talks on phone: Not on file    Gets together: Not on file    Attends religious service: Not on file    Active member of club or organization:  Not on file    Attends meetings of clubs or organizations: Not on file    Relationship status: Not on file  Other Topics Concern  . 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 Breast cancer Neg Hx     Current Outpatient Medications (Endocrine & Metabolic):  .  levothyroxine (SYNTHROID, LEVOTHROID) 88 MCG tablet, TAKE 1 TABLET(88 MCG) BY MOUTH DAILY  Current Outpatient Medications (Cardiovascular):  .  fenofibrate (TRICOR) 145 MG tablet, TAKE 1 TABLET(145 MG) BY MOUTH DAILY .  nitroGLYCERIN (NITRODUR - DOSED IN MG/24 HR) 0.2 mg/hr patch, 1/4 patch daily    Current Outpatient Medications (Hematological):  .  folic acid (FOLVITE) 1 MG tablet, Take 2 mg by mouth daily. .  vitamin B-12 (CYANOCOBALAMIN) 500 MCG tablet, Take 500 mcg by mouth  daily.    Current Outpatient Medications (Other):  .  azaTHIOprine (IMURAN) 50 MG tablet, TAKE 3 TABLETS(150 MG) BY MOUTH DAILY .  calcium-vitamin D (OSCAL WITH D) 500-200 MG-UNIT tablet, Take 2 tablets by mouth daily. .  mesalamine (LIALDA) 1.2 g EC tablet, Take 2 tablets (2.4 g total) by mouth daily. .  Potassium Gluconate 550 MG TABS, Take 2 tablets by mouth daily.  .  Sennosides (EX-LAX PO), Take by mouth as directed. .  valACYclovir (VALTREX) 500 MG tablet, TAKE 1 TABLET(500 MG) BY MOUTH DAILY .  venlafaxine XR (EFFEXOR-XR) 150 MG 24 hr capsule, Take 1 capsule by mouth every morning with breakfast. Need office visit for more refills. .  venlafaxine XR (EFFEXOR-XR) 75 MG 24 hr capsule, TAKE 1 CAPSULE BY MOUTH DAILY WITH BREAKFAST IN ADDITION TO 150 MG DAILY. Need office visit for more refills.    Past medical history, social, surgical and family history all reviewed in electronic medical record.  No pertanent information unless stated regarding to the chief complaint.   Review of Systems:  No headache, visual changes, nausea, vomiting, diarrhea, constipation, dizziness, abdominal pain, skin rash, fevers, chills, night sweats, weight loss, swollen lymph nodes, body aches, joint swelling, , chest pain, shortness of breath, mood changes.  Mild positive muscle aches  Objective  Blood pressure 100/74, pulse 93, height 5' (1.524 m), weight 112 lb (50.8 kg), last menstrual period 10/12/2005, SpO2 97 %.    General: No apparent distress alert and oriented x3 mood and affect normal, dressed appropriately.  HEENT: Pupils equal, extraocular movements intact  Respiratory: Patient's speak in full sentences and does not appear short of breath  Cardiovascular: No lower extremity edema, non tender, no erythema  Skin: Warm dry intact with no signs of infection or rash on extremities or on axial skeleton.  Abdomen: Soft nontender  Neuro: Cranial nerves II through XII are intact, neurovascularly intact  in all extremities with 2+ DTRs and 2+ pulses.  Lymph: No lymphadenopathy of posterior or anterior cervical chain or axillae bilaterally.  Gait normal with good balance and coordination.  MSK:  Non tender with full range of motion and good stability and symmetric strength and tone of shoulders, , wrist, hip, knee and ankles bilaterally.  Elbow: Right Unremarkable to inspection. Mild decreased range of motion lacking 30 degrees of extension Stable to varus, valgus stress. Negative moving valgus stress test. Mild pain over the lateral epicondylar region Ulnar nerve does not sublux. Negative cubital tunnel Tinel's. Contralateral elbow unremarkable  Musculoskeletal ultrasound was performed  and interpreted by Charlann Boxer D.O.   Elbow:  Lateral epicondyle and common extensor tendon origin visualized.  Mild hypoechoic changes and increasing Doppler flow still within intersubstance noted.  Some increased neovascularization noted from previous exam.  Increase scar tissue formation as well.  No true mass appreciated at this time which is an improvement.  IMPRESSION: Lateral epicondylitis with intrasubstance tearing that shows interval healing from previous exam    Impression and Recommendations:      The above documentation has been reviewed and is accurate and complete Lyndal Pulley, DO       Note: This dictation was prepared with Dragon dictation along with smaller phrase technology. Any transcriptional errors that result from this process are unintentional.

## 2018-12-14 NOTE — Patient Instructions (Signed)
Good to see you  Ice is your friend COntinue the exercises 2-3 times a week  Nitro still daily for 4 weeks then 3 times a week for 2 weeks\ Then see me one more time in 6 weeks to make sure perfect

## 2018-12-14 NOTE — Assessment & Plan Note (Signed)
Significant improvement from previous exam.  Encourage patient is still decrease the amount of wrist extension.  Continue the nitroglycerin as long as patient is not having any side effects for some time here.  Patient then is going to see me again in 4 weeks to make sure completely healed.

## 2018-12-15 DIAGNOSIS — H43392 Other vitreous opacities, left eye: Secondary | ICD-10-CM | POA: Diagnosis not present

## 2018-12-15 DIAGNOSIS — H2512 Age-related nuclear cataract, left eye: Secondary | ICD-10-CM | POA: Diagnosis not present

## 2018-12-15 DIAGNOSIS — H43812 Vitreous degeneration, left eye: Secondary | ICD-10-CM | POA: Diagnosis not present

## 2018-12-15 LAB — HM DIABETES EYE EXAM

## 2019-01-05 ENCOUNTER — Other Ambulatory Visit: Payer: Self-pay | Admitting: Gastroenterology

## 2019-01-05 ENCOUNTER — Other Ambulatory Visit: Payer: Self-pay | Admitting: Internal Medicine

## 2019-01-18 ENCOUNTER — Telehealth: Payer: Self-pay

## 2019-01-18 NOTE — Telephone Encounter (Signed)
Called patient about appointment on 01/25/2019. Sent Mychart message as well.

## 2019-01-20 ENCOUNTER — Other Ambulatory Visit: Payer: Self-pay | Admitting: Internal Medicine

## 2019-01-21 ENCOUNTER — Other Ambulatory Visit: Payer: Self-pay | Admitting: Internal Medicine

## 2019-01-25 ENCOUNTER — Ambulatory Visit: Payer: BLUE CROSS/BLUE SHIELD | Admitting: Family Medicine

## 2019-01-30 ENCOUNTER — Ambulatory Visit: Payer: BLUE CROSS/BLUE SHIELD | Admitting: Gastroenterology

## 2019-02-04 ENCOUNTER — Other Ambulatory Visit: Payer: Self-pay | Admitting: Gastroenterology

## 2019-02-08 ENCOUNTER — Other Ambulatory Visit: Payer: Self-pay

## 2019-02-08 MED ORDER — AZATHIOPRINE 50 MG PO TABS: ORAL_TABLET | ORAL | 1 refills | Status: DC

## 2019-02-08 NOTE — Telephone Encounter (Signed)
Fax came requesting refill on patient's Imuran, sent in as requested.

## 2019-02-21 ENCOUNTER — Encounter: Payer: Self-pay | Admitting: Internal Medicine

## 2019-02-27 ENCOUNTER — Other Ambulatory Visit: Payer: Self-pay | Admitting: Internal Medicine

## 2019-03-27 ENCOUNTER — Other Ambulatory Visit: Payer: Self-pay | Admitting: Internal Medicine

## 2019-04-07 ENCOUNTER — Other Ambulatory Visit: Payer: Self-pay | Admitting: Internal Medicine

## 2019-04-09 ENCOUNTER — Other Ambulatory Visit: Payer: Self-pay | Admitting: Gastroenterology

## 2019-04-12 DIAGNOSIS — L821 Other seborrheic keratosis: Secondary | ICD-10-CM | POA: Diagnosis not present

## 2019-04-12 DIAGNOSIS — D485 Neoplasm of uncertain behavior of skin: Secondary | ICD-10-CM | POA: Diagnosis not present

## 2019-04-12 DIAGNOSIS — L814 Other melanin hyperpigmentation: Secondary | ICD-10-CM | POA: Diagnosis not present

## 2019-04-12 DIAGNOSIS — L570 Actinic keratosis: Secondary | ICD-10-CM | POA: Diagnosis not present

## 2019-04-12 DIAGNOSIS — D225 Melanocytic nevi of trunk: Secondary | ICD-10-CM | POA: Diagnosis not present

## 2019-04-26 ENCOUNTER — Other Ambulatory Visit: Payer: Self-pay | Admitting: Internal Medicine

## 2019-05-14 ENCOUNTER — Other Ambulatory Visit: Payer: Self-pay | Admitting: Internal Medicine

## 2019-05-15 ENCOUNTER — Other Ambulatory Visit: Payer: Self-pay | Admitting: Internal Medicine

## 2019-05-31 ENCOUNTER — Other Ambulatory Visit: Payer: Self-pay | Admitting: Internal Medicine

## 2019-06-03 NOTE — Progress Notes (Signed)
Subjective:    Patient ID: Tammy Kennedy, female    DOB: 1956-01-15, 63 y.o.   MRN: [id number]  HPI She is here for a physical exam.   She has less energy.  Diet coke helps.  Depression is likely contributing.      Medications and allergies reviewed with patient and updated if appropriate.  Patient Active Problem List   Diagnosis Date Noted  . Tension-type headache, not intractable 11/11/2018  . Lateral epicondylitis of right elbow 11/11/2018  . Basal cell carcinoma (BCC) 01/10/2018  . Gastroesophageal reflux disease 11/12/2017  . Idiopathic scoliosis 05/14/2016  . Alcohol abuse 05/14/2016  . Rectovaginal fistula   . C. difficile colitis   . Crohn's disease ([place]) 05/27/2009  . HEPATOMEGALY, HX OF 08/31/2008  . Hypothyroidism 07/30/2008  . Hyperlipidemia 07/30/2008  . Anxiety state 07/30/2008  . Depression 07/30/2008  . RENAL CYST 07/30/2008  . DEGENERATIVE DISC DISEASE 07/30/2008  . Osteopenia 07/30/2008    Current Outpatient Medications on File Prior to Visit  Medication Sig Dispense Refill  . azaTHIOprine (IMURAN) 50 MG tablet TAKE 3 TABLETS(150 MG) BY MOUTH DAILY 90 tablet 1  . calcium-vitamin D (OSCAL WITH D) 500-200 MG-UNIT tablet Take 2 tablets by mouth daily.    . fenofibrate (TRICOR) 145 MG tablet TAKE 1 TABLET(145 MG) BY MOUTH DAILY 90 tablet 2  . folic acid (FOLVITE) 1 MG tablet Take 2 mg by mouth daily.    Marland Kitchen levothyroxine (SYNTHROID, LEVOTHROID) 88 MCG tablet TAKE 1 TABLET(88 MCG) BY MOUTH DAILY 90 tablet 2  . Potassium Gluconate 550 MG TABS Take 2 tablets by mouth daily.     . Sennosides (EX-LAX PO) Take by mouth as directed.    . valACYclovir (VALTREX) 500 MG tablet TAKE 1 TABLET(500 MG) BY MOUTH DAILY 30 tablet 0  . venlafaxine XR (EFFEXOR-XR) 150 MG 24 hr capsule TAKE 1 CAPSULE BY MOUTH EVERY MORNING WITH BREAKFAST 30 capsule 5  . venlafaxine XR (EFFEXOR-XR) 75 MG 24 hr capsule TAKE 1 CAPSULE BY MOUTH DAILY WITH BREAKFAST IN ADDITION TO 150 MG DAILY  30 capsule 0  . vitamin B-12 (CYANOCOBALAMIN) 500 MCG tablet Take 500 mcg by mouth daily.       No current facility-administered medications on file prior to visit.     Past Medical History:  Diagnosis Date  . Alcohol abuse 05/14/2016  . ALCOHOL ABUSE, HX OF   . ANEMIA   . ANXIETY DISORDER   . Basal cell carcinoma (BCC) 01/10/2018  . C. difficile colitis   . CROHN'S DISEASE   . DEPRESSION   . DRY EYE SYNDROME   . HEPATOMEGALY, HX OF   . HYPERLIPIDEMIA    intol of statin trials  . HYPOTHYROIDISM   . OSTEOPENIA   . Osteoporosis 05/2017   states Pre osteoporosis 8/18  . Rectovaginal fistula 2012   chron's disease  . Tubular adenoma 2013    Past Surgical History:  Procedure Laterality Date  . ANAL FISSURE REPAIR    . EYE SURGERY  11/2017   macular pucker  . HEMORRHOID SURGERY    . Left thumb surgery  1989  . MOHS SURGERY  2008   Nose BCC  . Theraputic abortion      Social History   Socioeconomic History  . Marital status: Divorced    Spouse name: Not on file  . Number of children: Not on file  . Years of education: Not on file  . Highest education level: Not on file  Occupational History  . Not on file  Social Needs  . Financial resource strain: Not on file  . Food insecurity    Worry: Not on file    Inability: Not on file  . Transportation needs    Medical: Not on file    Non-medical: Not on file  Tobacco Use  . Smoking status: Never Smoker  . Smokeless tobacco: Never Used  Substance and Sexual Activity  . Alcohol use: Yes    Comment: has drank excessively, not drinking daily  . Drug use: No  . Sexual activity: Not on file  Lifestyle  . Physical activity    Days per week: Not on file    Minutes per session: Not on file  . Stress: Not on file  Relationships  . Social Herbalist on phone: Not on file    Gets together: Not on file    Attends religious service: Not on file    Active member of club or organization: Not on file    Attends  meetings of clubs or organizations: Not on file    Relationship status: Not on file  Other Topics Concern  . 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 Breast cancer Neg Hx     Review of Systems  Constitutional: Positive for fatigue. Negative for chills and fever.  Eyes: Negative for visual disturbance.  Respiratory: Negative for cough, shortness of breath and wheezing.   Cardiovascular: Negative for chest pain, palpitations and leg swelling.  Gastrointestinal: Positive for constipation. Negative for abdominal pain, blood in stool, diarrhea and nausea.       Occ GERD  Genitourinary: Negative for dysuria and hematuria.  Musculoskeletal: Negative for arthralgias and back pain.  Skin: Negative for color change and rash.  Neurological: Positive for light-headedness (typically when standing) and headaches (when sleeping - cervicogenic in nature). Negative for numbness.  Psychiatric/Behavioral: Positive for dysphoric mood. The patient is nervous/anxious.        Objective:   Vitals:   06/05/19 0758  BP: 122/78  Pulse: 79  Resp: 16  Temp: 98.6 F (37 C)  SpO2: 99%   Filed Weights   06/05/19 0758  Weight: 112 lb (50.8 kg)   Body mass index is 21.87 kg/m.  BP Readings from Last 3 Encounters:  06/05/19 122/78  12/14/18 100/74  11/24/18 102/74    Wt Readings from Last 3 Encounters:  06/05/19 112 lb (50.8 kg)  12/14/18 112 lb (50.8 kg)  11/24/18 111 lb (50.3 kg)     Physical Exam Constitutional: She appears well-developed and well-nourished. No distress.  HENT:  Head: Normocephalic and atraumatic.  Right Ear: External ear normal. Normal ear canal and TM Left Ear: External ear normal.  Normal ear canal and TM Mouth/Throat: Oropharynx is clear and moist.  Eyes: Conjunctivae and EOM are normal.  Neck: Neck supple. No tracheal deviation  present. No thyromegaly present.  No carotid bruit  Cardiovascular: Normal rate, regular rhythm and normal heart sounds.  No murmur heard.  No edema. Pulmonary/Chest: Effort normal and breath sounds normal. No respiratory distress. She has no wheezes. She has no rales.  Breast: deferred   Abdominal: Soft. She exhibits no distension. There is no tenderness.  Lymphadenopathy: She has no cervical adenopathy.  Skin: Skin is warm and dry. She is not diaphoretic.  Psychiatric: She has  a normal mood and affect. Her behavior is normal.        Assessment & Plan:   Physical exam: Screening blood work ordered Immunizations   Discussed shingrix, deferred flu, tdap today Colonoscopy   Up to date  Mammogram  Due this year - last done 04/2018 Gyn  - no longer sees gyn - discussed pap Dexa   Due -   ordered Eye exams  Up to date  Exercise   No regular exercise Weight    Normal BMI Skin   Sees derm, no concerns Substance abuse   none    See Problem List for Assessment and Plan of chronic medical problems.

## 2019-06-03 NOTE — Patient Instructions (Addendum)
Tests ordered today. Your results will be released to [facility] (or called to you) after review.  If any changes need to be made, you will be notified at that same time.  All other Health Maintenance issues reviewed.   All recommended immunizations and age-appropriate screenings are up-to-date or discussed.  tetanus immunization administered today.   Medications reviewed and updated.  Changes include :  non     Please followup in 1 year   Health Maintenance, Female Adopting a healthy lifestyle and getting preventive care are important in promoting health and wellness. Ask your health care provider about:  The right schedule for you to have regular tests and exams.  Things you can do on your own to prevent diseases and keep yourself healthy. What should I know about diet, weight, and exercise? Eat a healthy diet   Eat a diet that includes plenty of vegetables, fruits, low-fat dairy products, and lean protein.  Do not eat a lot of foods that are high in solid fats, added sugars, or sodium. Maintain a healthy weight Body mass index (BMI) is used to identify weight problems. It estimates body fat based on height and weight. Your health care provider can help determine your BMI and help you achieve or maintain a healthy weight. Get regular exercise Get regular exercise. This is one of the most important things you can do for your health. Most adults should:  Exercise for at least 150 minutes each week. The exercise should increase your heart rate and make you sweat (moderate-intensity exercise).  Do strengthening exercises at least twice a week. This is in addition to the moderate-intensity exercise.  Spend less time sitting. Even light physical activity can be beneficial. Watch cholesterol and blood lipids Have your blood tested for lipids and cholesterol at 63 years of age, then have this test every 5 years. Have your cholesterol levels checked more often if:  Your lipid or  cholesterol levels are high.  You are older than 63 years of age.  You are at high risk for heart disease. What should I know about cancer screening? Depending on your health history and family history, you may need to have cancer screening at various ages. This may include screening for:  Breast cancer.  Cervical cancer.  Colorectal cancer.  Skin cancer.  Lung cancer. What should I know about heart disease, diabetes, and high blood pressure? Blood pressure and heart disease  High blood pressure causes heart disease and increases the risk of stroke. This is more likely to develop in people who have high blood pressure readings, are of African descent, or are overweight.  Have your blood pressure checked: ? Every 3-5 years if you are 24-61 years of age. ? Every year if you are 36 years old or older. Diabetes Have regular diabetes screenings. This checks your fasting blood sugar level. Have the screening done:  Once every three years after age 66 if you are at a normal weight and have a low risk for diabetes.  More often and at a younger age if you are overweight or have a high risk for diabetes. What should I know about preventing infection? Hepatitis B If you have a higher risk for hepatitis B, you should be screened for this virus. Talk with your health care provider to find out if you are at risk for hepatitis B infection. Hepatitis C Testing is recommended for:  Everyone born from [birth year] through 1965.  Anyone with known risk factors for hepatitis C. Sexually  transmitted infections (STIs)  Get screened for STIs, including gonorrhea and chlamydia, if: ? You are sexually active and are younger than 63 years of age. ? You are older than 63 years of age and your health care provider tells you that you are at risk for this type of infection. ? Your sexual activity has changed since you were last screened, and you are at increased risk for chlamydia or gonorrhea. Ask your  health care provider if you are at risk.  Ask your health care provider about whether you are at high risk for HIV. Your health care provider may recommend a prescription medicine to help prevent HIV infection. If you choose to take medicine to prevent HIV, you should first get tested for HIV. You should then be tested every 3 months for as long as you are taking the medicine. Pregnancy  If you are about to stop having your period (premenopausal) and you may become pregnant, seek counseling before you get pregnant.  Take 400 to 800 micrograms (mcg) of folic acid every day if you become pregnant.  Ask for birth control (contraception) if you want to prevent pregnancy. Osteoporosis and menopause Osteoporosis is a disease in which the bones lose minerals and strength with aging. This can result in bone fractures. If you are 37 years old or older, or if you are at risk for osteoporosis and fractures, ask your health care provider if you should:  Be screened for bone loss.  Take a calcium or vitamin D supplement to lower your risk of fractures.  Be given hormone replacement therapy (HRT) to treat symptoms of menopause. Follow these instructions at home: Lifestyle  Do not use any products that contain nicotine or tobacco, such as cigarettes, e-cigarettes, and chewing tobacco. If you need help quitting, ask your health care provider.  Do not use street drugs.  Do not share needles.  Ask your health care provider for help if you need support or information about quitting drugs. Alcohol use  Do not drink alcohol if: ? Your health care provider tells you not to drink. ? You are pregnant, may be pregnant, or are planning to become pregnant.  If you drink alcohol: ? Limit how much you use to 0-1 drink a day. ? Limit intake if you are breastfeeding.  Be aware of how much alcohol is in your drink. In the [place] one drink equals one 12 oz bottle of beer (355 mL), one 5 oz glass of wine (148  mL), or one 1 oz glass of hard liquor (44 mL). General instructions  Schedule regular health, dental, and eye exams.  Stay current with your vaccines.  Tell your health care provider if: ? You often feel depressed. ? You have ever been abused or do not feel safe at home. Summary  Adopting a healthy lifestyle and getting preventive care are important in promoting health and wellness.  Follow your health care provider's instructions about healthy diet, exercising, and getting tested or screened for diseases.  Follow your health care provider's instructions on monitoring your cholesterol and blood pressure. This information is not intended to replace advice given to you by your health care provider. Make sure you discuss any questions you have with your health care provider. Document Released: 04/13/2011 Document Revised: 09/21/2018 Document Reviewed: 09/21/2018 Elsevier Patient Education  2020 [employer].

## 2019-06-05 ENCOUNTER — Encounter: Payer: Self-pay | Admitting: Internal Medicine

## 2019-06-05 ENCOUNTER — Other Ambulatory Visit: Payer: Self-pay

## 2019-06-05 ENCOUNTER — Other Ambulatory Visit (INDEPENDENT_AMBULATORY_CARE_PROVIDER_SITE_OTHER): Payer: BC Managed Care – PPO

## 2019-06-05 ENCOUNTER — Ambulatory Visit (INDEPENDENT_AMBULATORY_CARE_PROVIDER_SITE_OTHER)
Admission: RE | Admit: 2019-06-05 | Discharge: 2019-06-05 | Disposition: A | Discharge status: Home / Self Care | Payer: BC Managed Care – PPO | Source: Ambulatory Visit | Attending: Internal Medicine | Admitting: Internal Medicine

## 2019-06-05 ENCOUNTER — Ambulatory Visit (INDEPENDENT_AMBULATORY_CARE_PROVIDER_SITE_OTHER): Payer: BC Managed Care – PPO | Admitting: Internal Medicine

## 2019-06-05 VITALS — BP 122/78 | HR 79 | Temp 98.6°F | Resp 16 | Ht 60.0 in | Wt 112.0 lb

## 2019-06-05 DIAGNOSIS — Z23 Encounter for immunization: Secondary | ICD-10-CM | POA: Diagnosis not present

## 2019-06-05 DIAGNOSIS — E039 Hypothyroidism, unspecified: Secondary | ICD-10-CM | POA: Diagnosis not present

## 2019-06-05 DIAGNOSIS — K50919 Crohn's disease, unspecified, with unspecified complications: Secondary | ICD-10-CM

## 2019-06-05 DIAGNOSIS — M85852 Other specified disorders of bone density and structure, left thigh: Secondary | ICD-10-CM

## 2019-06-05 DIAGNOSIS — Z0000 Encounter for general adult medical examination without abnormal findings: Secondary | ICD-10-CM

## 2019-06-05 DIAGNOSIS — F3289 Other specified depressive episodes: Secondary | ICD-10-CM | POA: Diagnosis not present

## 2019-06-05 DIAGNOSIS — E782 Mixed hyperlipidemia: Secondary | ICD-10-CM

## 2019-06-05 DIAGNOSIS — F411 Generalized anxiety disorder: Secondary | ICD-10-CM

## 2019-06-05 DIAGNOSIS — C44612 Basal cell carcinoma of skin of right upper limb, including shoulder: Secondary | ICD-10-CM

## 2019-06-05 LAB — CBC WITH DIFFERENTIAL/PLATELET
Basophils Absolute: 0 10*3/uL (ref 0.0–0.1)
Basophils Relative: 1.3 % (ref 0.0–3.0)
Eosinophils Absolute: 0 10*3/uL (ref 0.0–0.7)
Eosinophils Relative: 1.1 % (ref 0.0–5.0)
HCT: 37.6 % (ref 36.0–46.0)
Hemoglobin: 12.8 g/dL (ref 12.0–15.0)
Lymphocytes Relative: 27.3 % (ref 12.0–46.0)
Lymphs Abs: 0.7 10*3/uL (ref 0.7–4.0)
MCHC: 34.1 g/dL (ref 30.0–36.0)
MCV: 115.1 fl — ABNORMAL HIGH (ref 78.0–100.0)
Monocytes Absolute: 0.2 10*3/uL (ref 0.1–1.0)
Monocytes Relative: 8.4 % (ref 3.0–12.0)
Neutro Abs: 1.6 10*3/uL (ref 1.4–7.7)
Neutrophils Relative %: 61.9 % (ref 43.0–77.0)
Platelets: 262 10*3/uL (ref 150.0–400.0)
RBC: 3.27 Mil/uL — ABNORMAL LOW (ref 3.87–5.11)
RDW: 14 % (ref 11.5–15.5)
WBC: 2.5 10*3/uL — ABNORMAL LOW (ref 4.0–10.5)

## 2019-06-05 LAB — COMPREHENSIVE METABOLIC PANEL
ALT: 7 U/L (ref 0–35)
AST: 16 U/L (ref 0–37)
Albumin: 4.7 g/dL (ref 3.5–5.2)
Alkaline Phosphatase: 35 U/L — ABNORMAL LOW (ref 39–117)
BUN: 16 mg/dL (ref 6–23)
CO2: 29 mEq/L (ref 19–32)
Calcium: 10.1 mg/dL (ref 8.4–10.5)
Chloride: 104 mEq/L (ref 96–112)
Creatinine, Ser: 0.79 mg/dL (ref 0.40–1.20)
GFR: 73.5 mL/min (ref >60.00)
Glucose, Bld: 102 mg/dL — ABNORMAL HIGH (ref 70–99)
Potassium: 3.8 mEq/L (ref 3.5–5.1)
Sodium: 140 mEq/L (ref 135–145)
Total Bilirubin: 0.5 mg/dL (ref 0.2–1.2)
Total Protein: 7.2 g/dL (ref 6.0–8.3)

## 2019-06-05 LAB — LIPID PANEL
Cholesterol: 249 mg/dL — ABNORMAL HIGH (ref 0–200)
HDL: 78.5 mg/dL (ref >39.00)
LDL Cholesterol: 138 mg/dL — ABNORMAL HIGH (ref 0–99)
NonHDL: 170.96
Total CHOL/HDL Ratio: 3
Triglycerides: 164 mg/dL — ABNORMAL HIGH (ref 0.0–149.0)
VLDL: 32.8 mg/dL (ref 0.0–40.0)

## 2019-06-05 NOTE — Assessment & Plan Note (Signed)
Sees derm yearly - up to date

## 2019-06-05 NOTE — Assessment & Plan Note (Signed)
Having some depression Discussed changing effexor or staying at current dose - increasing it will likely not help Will continue Effexor 225 mg daily - if she decides she wants to change medications she will come in

## 2019-06-05 NOTE — Assessment & Plan Note (Signed)
dexa due - ordered Taking calcium and vitamin d daily - continue Advised exercise

## 2019-06-05 NOTE — Assessment & Plan Note (Signed)
Having constipation at this point - ?hypothyroid Taking vitamins Following with GI

## 2019-06-05 NOTE — Assessment & Plan Note (Signed)
On tricor Check lipids, cmp

## 2019-06-05 NOTE — Assessment & Plan Note (Signed)
Has some anxiety, but fairly controlled Continue effexor

## 2019-06-05 NOTE — Assessment & Plan Note (Signed)
She is having some fatigue - ? Thyroid related and has constipation Clinically hypothyroid Check tsh and adjust medication if needed

## 2019-06-06 LAB — TSH: TSH: 0.14 u[IU]/mL — ABNORMAL LOW (ref 0.35–4.50)

## 2019-06-06 LAB — T3, FREE: T3, Free: 3.4 pg/mL (ref 2.3–4.2)

## 2019-06-06 LAB — T4, FREE: Free T4: 1.01 ng/dL (ref 0.60–1.60)

## 2019-06-08 ENCOUNTER — Encounter: Payer: Self-pay | Admitting: Internal Medicine

## 2019-06-08 ENCOUNTER — Other Ambulatory Visit: Payer: Self-pay | Admitting: Internal Medicine

## 2019-06-08 DIAGNOSIS — M85852 Other specified disorders of bone density and structure, left thigh: Secondary | ICD-10-CM | POA: Diagnosis not present

## 2019-06-08 DIAGNOSIS — E039 Hypothyroidism, unspecified: Secondary | ICD-10-CM

## 2019-06-08 MED ORDER — LEVOTHYROXINE SODIUM 88 MCG PO TABS: ORAL_TABLET | ORAL | 2 refills | Status: DC

## 2019-06-09 ENCOUNTER — Other Ambulatory Visit: Payer: Self-pay | Admitting: Internal Medicine

## 2019-06-09 MED ORDER — THIAMINE HCL 100 MG PO TABS: 100.0000 mg | ORAL_TABLET | Freq: Every day | ORAL | 3 refills | Status: DC

## 2019-06-18 ENCOUNTER — Other Ambulatory Visit: Payer: Self-pay | Admitting: Internal Medicine

## 2019-06-25 ENCOUNTER — Other Ambulatory Visit: Payer: Self-pay | Admitting: Gastroenterology

## 2019-07-01 ENCOUNTER — Other Ambulatory Visit: Payer: Self-pay | Admitting: Internal Medicine

## 2019-07-07 ENCOUNTER — Other Ambulatory Visit: Payer: Self-pay | Admitting: Internal Medicine

## 2019-07-15 ENCOUNTER — Other Ambulatory Visit: Payer: Self-pay | Admitting: Internal Medicine

## 2019-08-08 ENCOUNTER — Encounter: Payer: Self-pay | Admitting: Internal Medicine

## 2019-08-08 MED ORDER — THIAMINE HCL 50 MG PO CAPS: 50.0000 mg | ORAL_CAPSULE | Freq: Every day | ORAL | 3 refills | Status: DC

## 2019-08-21 ENCOUNTER — Other Ambulatory Visit: Payer: Self-pay | Admitting: Internal Medicine

## 2019-08-27 ENCOUNTER — Other Ambulatory Visit: Payer: Self-pay | Admitting: Gastroenterology

## 2019-10-26 ENCOUNTER — Other Ambulatory Visit: Payer: Self-pay | Admitting: Gastroenterology

## 2020-01-05 ENCOUNTER — Ambulatory Visit: Payer: BC Managed Care – PPO | Admitting: Gastroenterology

## 2020-01-05 ENCOUNTER — Other Ambulatory Visit (INDEPENDENT_AMBULATORY_CARE_PROVIDER_SITE_OTHER): Payer: BC Managed Care – PPO

## 2020-01-05 ENCOUNTER — Encounter: Payer: Self-pay | Admitting: Gastroenterology

## 2020-01-05 VITALS — BP 108/80 | HR 92 | Temp 98.1°F | Ht 59.0 in | Wt 114.0 lb

## 2020-01-05 DIAGNOSIS — D72819 Decreased white blood cell count, unspecified: Secondary | ICD-10-CM

## 2020-01-05 DIAGNOSIS — K5080 Crohn's disease of both small and large intestine without complications: Secondary | ICD-10-CM | POA: Diagnosis not present

## 2020-01-05 DIAGNOSIS — K5904 Chronic idiopathic constipation: Secondary | ICD-10-CM | POA: Diagnosis not present

## 2020-01-05 DIAGNOSIS — Z9225 Personal history of immunosupression therapy: Secondary | ICD-10-CM

## 2020-01-05 DIAGNOSIS — Z85828 Personal history of other malignant neoplasm of skin: Secondary | ICD-10-CM

## 2020-01-05 LAB — CBC WITH DIFFERENTIAL/PLATELET
Basophils Absolute: 0 10*3/uL (ref 0.0–0.1)
Basophils Relative: 1.2 % (ref 0.0–3.0)
Eosinophils Absolute: 0 10*3/uL (ref 0.0–0.7)
Eosinophils Relative: 1.2 % (ref 0.0–5.0)
HCT: 36.1 % (ref 36.0–46.0)
Hemoglobin: 12.5 g/dL (ref 12.0–15.0)
Lymphocytes Relative: 27.2 % (ref 12.0–46.0)
Lymphs Abs: 0.5 10*3/uL — ABNORMAL LOW (ref 0.7–4.0)
MCHC: 34.5 g/dL (ref 30.0–36.0)
MCV: 115.1 fl — ABNORMAL HIGH (ref 78.0–100.0)
Monocytes Absolute: 0.1 10*3/uL (ref 0.1–1.0)
Monocytes Relative: 5.5 % (ref 3.0–12.0)
Neutro Abs: 1.3 10*3/uL — ABNORMAL LOW (ref 1.4–7.7)
Neutrophils Relative %: 64.9 % (ref 43.0–77.0)
Platelets: 289 10*3/uL (ref 150.0–400.0)
RBC: 3.14 Mil/uL — ABNORMAL LOW (ref 3.87–5.11)
RDW: 14.1 % (ref 11.5–15.5)
WBC: 2 10*3/uL — ABNORMAL LOW (ref 4.0–10.5)

## 2020-01-05 LAB — IBC + FERRITIN
Ferritin: 119.7 ng/mL (ref 10.0–291.0)
Iron: 108 ug/dL (ref 42–145)
Saturation Ratios: 21.2 % (ref 20.0–50.0)
Transferrin: 364 mg/dL — ABNORMAL HIGH (ref 212.0–360.0)

## 2020-01-05 LAB — COMPREHENSIVE METABOLIC PANEL
ALT: 7 U/L (ref 0–35)
AST: 16 U/L (ref 0–37)
Albumin: 4.5 g/dL (ref 3.5–5.2)
Alkaline Phosphatase: 31 U/L — ABNORMAL LOW (ref 39–117)
BUN: 18 mg/dL (ref 6–23)
CO2: 28 mEq/L (ref 19–32)
Calcium: 10 mg/dL (ref 8.4–10.5)
Chloride: 105 mEq/L (ref 96–112)
Creatinine, Ser: 0.93 mg/dL (ref 0.40–1.20)
GFR: 60.77 mL/min (ref >60.00)
Glucose, Bld: 116 mg/dL — ABNORMAL HIGH (ref 70–99)
Potassium: 4.1 mEq/L (ref 3.5–5.1)
Sodium: 140 mEq/L (ref 135–145)
Total Bilirubin: 0.4 mg/dL (ref 0.2–1.2)
Total Protein: 7.1 g/dL (ref 6.0–8.3)

## 2020-01-05 LAB — HIGH SENSITIVITY CRP: CRP, High Sensitivity: 0.87 mg/L (ref 0.000–5.000)

## 2020-01-05 LAB — FOLATE: Folate: >23.7 ng/mL (ref >5.9)

## 2020-01-05 LAB — VITAMIN B12: Vitamin B-12: 829 pg/mL (ref 211–911)

## 2020-01-05 LAB — VITAMIN D 25 HYDROXY (VIT D DEFICIENCY, FRACTURES): VITD: 64.25 ng/mL (ref 30.00–100.00)

## 2020-01-05 LAB — SEDIMENTATION RATE: Sed Rate: 9 mm/hr (ref 0–30)

## 2020-01-05 MED ORDER — MESALAMINE ER 0.375 G PO CP24: 750.0000 mg | ORAL_CAPSULE | Freq: Every day | ORAL | 3 refills | Status: DC

## 2020-01-05 MED ORDER — LUBIPROSTONE 8 MCG PO CAPS: 8.0000 ug | ORAL_CAPSULE | Freq: Two times a day (BID) | ORAL | 3 refills | Status: DC

## 2020-01-05 NOTE — Progress Notes (Signed)
Tammy Kennedy    [id number]    Nov 25, 1955  Primary Care Physician:Burns, Claudina Lick, MD  Referring Physician: Binnie Rail, MD [place],  [place] [address]   Chief complaint: Crohn's disease  HPI: 64 year old female with history of Crohn's disease complicated by rectovaginal fistula for follow-up with recommend treatment of Crohn's disease  Last seen in office August 2019  She is in clinical remission on Imuran 150 mg daily  Complains of constipation with bowel movement once every other day to once a day despite taking 1 Ex-Lax daily  She stopped taking mesalamine 2 years ago, her insurance changed coverage and she was having high out-of-pocket expense.  Denies any rectal bleeding, melena, abdominal pain, nausea or vomiting.  She sees dermatologist at least once a year, had skin nodules removed were basal cell carcinoma with negative margins, most recent in April 2019.   Colonoscopy September 2018 with findings of colitis in remission, random biopsies negative for active inflammation  CBC Latest Ref Rng & Units 06/05/2019 05/12/2018 04/27/2017  WBC 4.0 - 10.5 K/uL 2.5(L) 3.5(L) 3.3(L)  Hemoglobin 12.0 - 15.0 g/dL 12.8 11.3(L) 12.4  Hematocrit 36.0 - 46.0 % 37.6 32.7(L) 35.6(L)  Platelets 150.0 - 400.0 K/uL 262.0 265.0 361.0   Iron/TIBC/Ferritin/ %Sat    Component Value Date/Time   IRON 107 04/27/2017 1546   FERRITIN 181.6 05/12/2018 1014   IRONPCTSAT 20.8 04/27/2017 1546    Outpatient Encounter Medications as of 01/05/2020  Medication Sig  . azaTHIOprine (IMURAN) 50 MG tablet TAKE 3 TABLETS(150 MG) BY MOUTH DAILY  . calcium-vitamin D (OSCAL WITH D) 500-200 MG-UNIT tablet Take 2 tablets by mouth daily.  . fenofibrate (TRICOR) 145 MG tablet TAKE 1 TABLET(145 MG) BY MOUTH DAILY  . folic acid (FOLVITE) 1 MG tablet Take 2 mg by mouth daily.  Marland Kitchen levothyroxine (SYNTHROID) 88 MCG tablet TAKE 1 TABLET(88 MCG) BY MOUTH DAILY  . Potassium Gluconate  550 MG TABS Take 2 tablets by mouth daily.   . Sennosides (EX-LAX PO) Take by mouth as directed.  . Thiamine HCl 50 MG CAPS Take 1 capsule (50 mg total) by mouth daily.  . valACYclovir (VALTREX) 500 MG tablet TAKE 1 TABLET(500 MG) BY MOUTH DAILY  . venlafaxine XR (EFFEXOR-XR) 150 MG 24 hr capsule TAKE 1 CAPSULE BY MOUTH EVERY MORNING WITH BREAKFAST  . venlafaxine XR (EFFEXOR-XR) 75 MG 24 hr capsule TAKE 1 CAPSULE BY MOUTH DAILY WITH BREAKFAST IN ADDITION TO 150 MG DAILY  . vitamin B-12 (CYANOCOBALAMIN) 500 MCG tablet Take 500 mcg by mouth daily.     No facility-administered encounter medications on file as of 01/05/2020.    Allergies as of 01/05/2020  . (No Known Allergies)    Past Medical History:  Diagnosis Date  . Alcohol abuse 05/14/2016  . ALCOHOL ABUSE, HX OF   . ANEMIA   . ANXIETY DISORDER   . Basal cell carcinoma (BCC) 01/10/2018  . C. difficile colitis   . CROHN'S DISEASE   . DEPRESSION   . DRY EYE SYNDROME   . HEPATOMEGALY, HX OF   . HYPERLIPIDEMIA    intol of statin trials  . HYPOTHYROIDISM   . OSTEOPENIA   . Osteoporosis 05/2017   states Pre osteoporosis 8/18  . Rectovaginal fistula 2012   chron's disease  . Tubular adenoma 2013    Past Surgical History:  Procedure Laterality Date  . ANAL FISSURE REPAIR    . EYE  SURGERY  11/2017   macular pucker  . HEMORRHOID SURGERY    . Left thumb surgery  1989  . MOHS SURGERY  2008   Nose BCC  . Theraputic abortion      Family History  Adopted: Yes  Problem Relation Age of Onset  . Breast cancer Neg Hx     Social History   Socioeconomic History  . Marital status: Divorced    Spouse name: Not on file  . Number of children: Not on file  . Years of education: Not on file  . Highest education level: Not on file  Occupational History  . Not on file  Tobacco Use  . Smoking status: Never Smoker  . Smokeless tobacco: Never Used  Substance and Sexual Activity  . Alcohol use: Yes    Comment: has drank  excessively, not drinking daily  . Drug use: No  . Sexual activity: Not on file  Other Topics Concern  . 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 Difficulty of Paying Living Expenses:   Food Insecurity:   . Worried About Charity fundraiser in the Last Year:   . Arboriculturist in the Last Year:   Transportation Needs:   . Film/video editor (Medical):   Marland Kitchen Lack of Transportation (Non-Medical):   Physical Activity:   . Days of Exercise per Week:   . Minutes of Exercise per Session:   Stress:   . Feeling of Stress :   Social Connections:   . Frequency of Communication with Friends and Family:   . Frequency of Social Gatherings with Friends and Family:   . Attends Religious Services:   . Active Member of Clubs or Organizations:   . Attends Archivist Meetings:   Marland Kitchen Marital Status:   Intimate Partner Violence:   . Fear of Current or Ex-Partner:   . Emotionally Abused:   Marland Kitchen Physically Abused:   . Sexually Abused:       Review of systems: All other review of systems negative except as mentioned in the HPI.   Physical Exam: Vitals:   01/05/20 0916  Temp: 98.1 F (36.7 C)   Body mass index is 23.03 kg/m. Gen:      No acute distress Abd:      + bowel sounds; soft, non-tender; no palpable masses, no distension Neuro: alert and oriented x 3 Psych: normal mood and affect  Data Reviewed:  Reviewed labs, radiology imaging, old records and pertinent past GI work up   Assessment and Plan/Recommendations:  64 year old female with Crohn's disease predominantly ileocolonic involvement, complicated by rectovaginal fistula, in clinical remission on Imuran  Worsening leukopenia based on labs and also history of basal cell carcinoma She has been on Imuran for many years now We will plan to slowly decrease the dose of Imuran and taper  off if she continues to remain in clinical remission  Decrease Imuran to 100 mg daily  Restart mesalamine, Apriso 750 mg daily.  Will send new prescription to pharmacy  Check CBC, CMP, ESR, CRP, B12, vitamin D, folate, iron panel and TB QuantiFERON gold to monitor disease activity and also IBD health maintenance  Encouraged patient to follow-up routinely with dermatology for annual skin exam  Due for surveillance colonoscopy, will plan to schedule it in the next 3 to 4 months.  Will obtain biopsies  from colon to assess disease activity and also exclude dysplasia  Chronic idiopathic constipation: Increase dietary fiber and fluid intake No response to fiber supplements, over-the-counter laxatives, stool softener and MiraLAX Start Amitiza 8 mcg twice daily  This visit required 40 minutes of patient care (this includes precharting, chart review, review of results, face-to-face time used for counseling as well as treatment plan and follow-up. The patient was provided an opportunity to ask questions and all were answered. The patient agreed with the plan and demonstrated an understanding of the instructions.  Damaris Hippo , MD    CC: Binnie Rail, MD

## 2020-01-05 NOTE — Patient Instructions (Signed)
If you are age 64 or older, your body mass index should be between 23-30. Your Body mass index is 23.03 kg/m. If this is out of the aforementioned range listed, please consider follow up with your Primary Care Provider.  If you are age 50 or younger, your body mass index should be between 19-25. Your Body mass index is 23.03 kg/m. If this is out of the aformentioned range listed, please consider follow up with your Primary Care Provider.   Your provider has requested that you go to the basement level for lab work before leaving today. Press "B" on the elevator. The lab is located at the first door on the left as you exit the elevator.  We have sent the following medications to your pharmacy for you to pick up at your convenience: Apriso Amitiza  DECREASE Imuran to 100 mg daily.  We will call with lab results.  Follow up with me in three months. Please call the office for an appointment as the schedule is not available at this time.  Thank you for choosing me and [facility].   Harl Bowie, MD

## 2020-01-07 LAB — QUANTIFERON-TB GOLD PLUS
Mitogen-NIL: 7.75 IU/mL
NIL: 0.03 IU/mL
QuantiFERON-TB Gold Plus: NEGATIVE
TB1-NIL: 0.01 IU/mL
TB2-NIL: 0.01 IU/mL

## 2020-01-08 ENCOUNTER — Telehealth: Payer: Self-pay

## 2020-01-08 ENCOUNTER — Other Ambulatory Visit: Payer: Self-pay

## 2020-01-08 MED ORDER — LUBIPROSTONE 24 MCG PO CAPS: 24.0000 ug | ORAL_CAPSULE | Freq: Two times a day (BID) | ORAL | 3 refills | Status: DC

## 2020-01-08 NOTE — Telephone Encounter (Signed)
Prior Auth submitted via CoverMyMeds for Amitiza 64mg.

## 2020-01-09 NOTE — Telephone Encounter (Signed)
Prior Auth for Amitiza denied for the following reason: "This medication is approved for members who have long-term constipation of unknown causes (chronic idiopathic constipation) when Trulance has been tried and did not work or was not tolerated. In this case, Trulance has not been tried."    Dr. Silverio Decamp, [place] to send Trulance 55m for pt to try? Thank you.

## 2020-01-10 MED ORDER — TRULANCE 3 MG PO TABS: 3.0000 mg | ORAL_TABLET | Freq: Every day | ORAL | 2 refills | Status: DC

## 2020-01-10 NOTE — Telephone Encounter (Signed)
Trulance 3 mg po qd, #30, 2 refills

## 2020-01-10 NOTE — Telephone Encounter (Signed)
Called and informed pt that Trulance was sent to pharmacy. She expressed understanding and will let us know how she does.

## 2020-01-11 NOTE — Telephone Encounter (Signed)
PA for Trulance 87m once daily submitted via CoverMyMeds

## 2020-01-13 ENCOUNTER — Other Ambulatory Visit: Payer: Self-pay | Admitting: Internal Medicine

## 2020-01-15 NOTE — Telephone Encounter (Signed)
Prior Auth for Trulance 72m qd approved through 01/09/2021.  Key: [id number].  Patient informed she can pick up from pharmacy and should Dr. NSilverio Decampknow how it works for her in a couple of weeks.

## 2020-01-15 NOTE — Telephone Encounter (Signed)
Ok thank you 

## 2020-01-19 ENCOUNTER — Other Ambulatory Visit: Payer: Self-pay | Admitting: Gastroenterology

## 2020-01-28 ENCOUNTER — Other Ambulatory Visit: Payer: Self-pay | Admitting: Internal Medicine

## 2020-02-29 ENCOUNTER — Other Ambulatory Visit: Payer: Self-pay | Admitting: Internal Medicine

## 2020-02-29 DIAGNOSIS — Z1231 Encounter for screening mammogram for malignant neoplasm of breast: Secondary | ICD-10-CM

## 2020-03-01 ENCOUNTER — Ambulatory Visit: Payer: BC Managed Care – PPO | Admitting: Gastroenterology

## 2020-03-01 ENCOUNTER — Encounter: Payer: Self-pay | Admitting: Gastroenterology

## 2020-03-01 VITALS — BP 114/70 | HR 104 | Ht 59.0 in | Wt 112.6 lb

## 2020-03-01 DIAGNOSIS — K5080 Crohn's disease of both small and large intestine without complications: Secondary | ICD-10-CM

## 2020-03-01 MED ORDER — SUPREP BOWEL PREP KIT 17.5-3.13-1.6 GM/177ML PO SOLN: 1.0000 | ORAL | 0 refills | Status: DC

## 2020-03-01 NOTE — Progress Notes (Signed)
Tammy Kennedy    [id number]    05/02/1956  Primary Care Physician:Burns, Claudina Lick, MD  Referring Physician: Binnie Rail, MD [place],  [place] [address]   Chief complaint: Crohn's disease HPI:  64 year old female with history of chronic Crohn's disease ileocolonic involvement, complicated by rectovaginal fistula here for follow-up office visit for management of Crohn's disease  Decreased Imuran dose 200 mg daily at last visit in March 2021  Amitiza was declined by insurance for chronic constipation, insurance required patient to try Trulance first.  She took it for 3 weeks but she was having excessive liquid diarrhea and stopped the medication.  She is currently having 2-3 soft bowel movements daily.  Denies any rectal bleeding, melena, abdominal pain, nausea or vomiting.  She sees dermatologist at least once a year, had skin nodules removed were basal cell carcinoma with negative margins, most recent in April 2019.   Colonoscopy September 2018 with findings of colitis in remission, random biopsies negative for active inflammation   Outpatient Encounter Medications as of 03/01/2020  Medication Sig  . azaTHIOprine (IMURAN) 50 MG tablet TAKE 2 TABLETS(150 MG) BY MOUTH DAILY  . calcium-vitamin D (OSCAL WITH D) 500-200 MG-UNIT tablet Take 2 tablets by mouth daily.  . fenofibrate (TRICOR) 145 MG tablet TAKE 1 TABLET(145 MG) BY MOUTH DAILY  . folic acid (FOLVITE) 1 MG tablet Take 2 mg by mouth daily.  Marland Kitchen levothyroxine (SYNTHROID) 88 MCG tablet TAKE 1 TABLET(88 MCG) BY MOUTH DAILY  . mesalamine (APRISO) 0.375 g 24 hr capsule Take 2 capsules (0.75 g total) by mouth daily.  . Potassium Gluconate 550 MG TABS Take 2 tablets by mouth daily.   . Sennosides (EX-LAX PO) Take by mouth as directed.  . Thiamine HCl 50 MG CAPS Take 1 capsule (50 mg total) by mouth daily.  . valACYclovir (VALTREX) 500 MG tablet TAKE 1 TABLET(500 MG) BY MOUTH DAILY  .  venlafaxine XR (EFFEXOR-XR) 150 MG 24 hr capsule TAKE 1 CAPSULE BY MOUTH EVERY MORNING WITH BREAKFAST  . venlafaxine XR (EFFEXOR-XR) 75 MG 24 hr capsule TAKE 1 CAPSULE BY MOUTH DAILY WITH BREAKFAST IN ADDITION TO 150 MG DAILY  . vitamin B-12 (CYANOCOBALAMIN) 500 MCG tablet Take 500 mcg by mouth daily.    Marland Kitchen Plecanatide (TRULANCE) 3 MG TABS Take 3 mg by mouth daily.   No facility-administered encounter medications on file as of 03/01/2020.    Allergies as of 03/01/2020  . (No Known Allergies)    Past Medical History:  Diagnosis Date  . Alcohol abuse 05/14/2016  . ALCOHOL ABUSE, HX OF   . ANEMIA   . ANXIETY DISORDER   . Basal cell carcinoma (BCC) 01/10/2018  . C. difficile colitis   . CROHN'S DISEASE   . DEPRESSION   . DRY EYE SYNDROME   . HEPATOMEGALY, HX OF   . HYPERLIPIDEMIA    intol of statin trials  . HYPOTHYROIDISM   . OSTEOPENIA   . Osteoporosis 05/2017   states Pre osteoporosis 8/18  . Rectovaginal fistula 2012   chron's disease  . Tubular adenoma 2013    Past Surgical History:  Procedure Laterality Date  . ANAL FISSURE REPAIR    . EYE SURGERY  11/2017   macular pucker  . HEMORRHOID SURGERY    . Left thumb surgery  1989  . MOHS SURGERY  2008   Nose BCC  . Theraputic abortion  Family History  Adopted: Yes  Problem Relation Age of Onset  . Breast cancer Neg Hx     Social History   Socioeconomic History  . Marital status: Divorced    Spouse name: Not on file  . Number of children: Not on file  . Years of education: Not on file  . Highest education level: Not on file  Occupational History  . Not on file  Tobacco Use  . Smoking status: Never Smoker  . Smokeless tobacco: Never Used  Substance and Sexual Activity  . Alcohol use: Yes    Comment: has drank excessively, not drinking daily  . Drug use: No  . Sexual activity: Not on file  Other Topics Concern  . 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 Difficulty of Paying Living Expenses:   Food Insecurity:   . Worried About Charity fundraiser in the Last Year:   . Arboriculturist in the Last Year:   Transportation Needs:   . Film/video editor (Medical):   Marland Kitchen Lack of Transportation (Non-Medical):   Physical Activity:   . Days of Exercise per Week:   . Minutes of Exercise per Session:   Stress:   . Feeling of Stress :   Social Connections:   . Frequency of Communication with Friends and Family:   . Frequency of Social Gatherings with Friends and Family:   . Attends Religious Services:   . Active Member of Clubs or Organizations:   . Attends Archivist Meetings:   Marland Kitchen Marital Status:   Intimate Partner Violence:   . Fear of Current or Ex-Partner:   . Emotionally Abused:   Marland Kitchen Physically Abused:   . Sexually Abused:       Review of systems: All other review of systems negative except as mentioned in the HPI.   Physical Exam: Vitals:   03/01/20 1332  BP: 114/70  Pulse: (!) 104  SpO2: 98%   Body mass index is 22.74 kg/m. Gen:      No acute distress Neuro: alert and oriented x 3 Psych: normal mood and affect  Data Reviewed:  Reviewed labs, radiology imaging, old records and pertinent past GI work up   Assessment and Plan/Recommendations:  64 year old female with history of Crohn's disease, ileocolonic involvement, complicated by rectovaginal fistula in clinical remission on Imuran  Decreased Imuran dose 100 mg daily due to worsening leukopenia Added Apriso 750 mg daily, she tolerating it well.  Continue current regimen  Due for surveillance colonoscopy, will schedule it The risks and benefits as well as alternatives of endoscopic procedure(s) have been discussed and reviewed. All questions answered. The patient agrees to proceed.  Chronic idiopathic constipation resolved Provided patient samples of Amitiza 8  mcg, advised her to take it twice daily if she has recurrent episodes of constipation  Return in 6 months or sooner if needed  This visit required 35 minutes of patient care (this includes precharting, chart review, review of results, face-to-face time used for counseling as well as treatment plan and follow-up. The patient was provided an opportunity to ask questions and all were answered. The patient agreed with the plan and demonstrated an understanding of the instructions.  Damaris Hippo , MD    CC: Binnie Rail, MD

## 2020-03-01 NOTE — Patient Instructions (Addendum)
If you are age 64 or older, your body mass index should be between 23-30. Your Body mass index is 22.74 kg/m. If this is out of the aforementioned range listed, please consider follow up with your Primary Care Provider.  If you are age 36 or younger, your body mass index should be between 19-25. Your Body mass index is 22.74 kg/m. If this is out of the aformentioned range listed, please consider follow up with your Primary Care Provider.   You have been scheduled for a colonoscopy. Please follow written instructions given to you at your visit today.  Please pick up your prep supplies at the pharmacy within the next 1-3 days. If you use inhalers (even only as needed), please bring them with you on the day of your procedure.  Due to recent changes in healthcare laws, you may see the results of your imaging and laboratory studies on MyChart before your provider has had a chance to review them.  We understand that in some cases there may be results that are confusing or concerning to you. Not all laboratory results come back in the same time frame and the provider may be waiting for multiple results in order to interpret others.  Please give [place] 48 hours in order for your provider to thoroughly review all the results before contacting the office for clarification of your results.   Continue taking Imuran and Apriso.  You were given samples of Amitiza 87mg to use if constipation gets worse.  I appreciate the opportunity to care for you. Thank you for choosing me and [facility],  Dr. KHarl Bowie

## 2020-03-15 ENCOUNTER — Other Ambulatory Visit: Payer: Self-pay | Admitting: Internal Medicine

## 2020-03-19 DIAGNOSIS — H524 Presbyopia: Secondary | ICD-10-CM | POA: Diagnosis not present

## 2020-03-19 DIAGNOSIS — E119 Type 2 diabetes mellitus without complications: Secondary | ICD-10-CM | POA: Diagnosis not present

## 2020-03-19 DIAGNOSIS — H35371 Puckering of macula, right eye: Secondary | ICD-10-CM | POA: Diagnosis not present

## 2020-03-27 ENCOUNTER — Encounter: Payer: Self-pay | Admitting: Internal Medicine

## 2020-03-28 ENCOUNTER — Other Ambulatory Visit: Payer: Self-pay

## 2020-03-28 ENCOUNTER — Ambulatory Visit
Admission: RE | Admit: 2020-03-28 | Discharge: 2020-03-28 | Disposition: A | Discharge status: Home / Self Care | Payer: BC Managed Care – PPO | Source: Ambulatory Visit | Attending: Internal Medicine | Admitting: Internal Medicine

## 2020-03-28 DIAGNOSIS — Z1231 Encounter for screening mammogram for malignant neoplasm of breast: Secondary | ICD-10-CM

## 2020-04-01 ENCOUNTER — Encounter: Payer: Self-pay | Admitting: Internal Medicine

## 2020-04-01 DIAGNOSIS — R7303 Prediabetes: Secondary | ICD-10-CM | POA: Insufficient documentation

## 2020-04-01 DIAGNOSIS — R739 Hyperglycemia, unspecified: Secondary | ICD-10-CM | POA: Insufficient documentation

## 2020-04-09 ENCOUNTER — Other Ambulatory Visit: Payer: Self-pay

## 2020-04-10 ENCOUNTER — Encounter: Payer: Self-pay | Admitting: Gastroenterology

## 2020-04-16 ENCOUNTER — Other Ambulatory Visit: Payer: Self-pay

## 2020-04-16 ENCOUNTER — Other Ambulatory Visit (INDEPENDENT_AMBULATORY_CARE_PROVIDER_SITE_OTHER): Payer: BC Managed Care – PPO

## 2020-04-16 DIAGNOSIS — K5080 Crohn's disease of both small and large intestine without complications: Secondary | ICD-10-CM

## 2020-04-16 DIAGNOSIS — Z85828 Personal history of other malignant neoplasm of skin: Secondary | ICD-10-CM | POA: Diagnosis not present

## 2020-04-16 DIAGNOSIS — D225 Melanocytic nevi of trunk: Secondary | ICD-10-CM | POA: Diagnosis not present

## 2020-04-16 DIAGNOSIS — L570 Actinic keratosis: Secondary | ICD-10-CM | POA: Diagnosis not present

## 2020-04-16 DIAGNOSIS — L821 Other seborrheic keratosis: Secondary | ICD-10-CM | POA: Diagnosis not present

## 2020-04-16 DIAGNOSIS — C44219 Basal cell carcinoma of skin of left ear and external auricular canal: Secondary | ICD-10-CM | POA: Diagnosis not present

## 2020-04-16 DIAGNOSIS — D485 Neoplasm of uncertain behavior of skin: Secondary | ICD-10-CM | POA: Diagnosis not present

## 2020-04-16 DIAGNOSIS — L814 Other melanin hyperpigmentation: Secondary | ICD-10-CM | POA: Diagnosis not present

## 2020-04-16 LAB — CBC WITH DIFFERENTIAL/PLATELET
Basophils Absolute: 0 10*3/uL (ref 0.0–0.1)
Basophils Relative: 1 % (ref 0.0–3.0)
Eosinophils Absolute: 0 10*3/uL (ref 0.0–0.7)
Eosinophils Relative: 1.1 % (ref 0.0–5.0)
HCT: 36.1 % (ref 36.0–46.0)
Hemoglobin: 12.5 g/dL (ref 12.0–15.0)
Lymphocytes Relative: 30.7 % (ref 12.0–46.0)
Lymphs Abs: 1 10*3/uL (ref 0.7–4.0)
MCHC: 34.6 g/dL (ref 30.0–36.0)
MCV: 109.9 fl — ABNORMAL HIGH (ref 78.0–100.0)
Monocytes Absolute: 0.3 10*3/uL (ref 0.1–1.0)
Monocytes Relative: 9.6 % (ref 3.0–12.0)
Neutro Abs: 1.9 10*3/uL (ref 1.4–7.7)
Neutrophils Relative %: 57.6 % (ref 43.0–77.0)
Platelets: 300 10*3/uL (ref 150.0–400.0)
RBC: 3.28 Mil/uL — ABNORMAL LOW (ref 3.87–5.11)
RDW: 15 % (ref 11.5–15.5)
WBC: 3.3 10*3/uL — ABNORMAL LOW (ref 4.0–10.5)

## 2020-04-16 NOTE — Progress Notes (Signed)
cbc

## 2020-04-23 ENCOUNTER — Ambulatory Visit (AMBULATORY_SURGERY_CENTER): Payer: BC Managed Care – PPO | Admitting: Gastroenterology

## 2020-04-23 ENCOUNTER — Other Ambulatory Visit: Payer: Self-pay

## 2020-04-23 ENCOUNTER — Encounter: Payer: Self-pay | Admitting: Gastroenterology

## 2020-04-23 VITALS — BP 99/60 | HR 68 | Temp 97.5°F | Resp 14 | Ht 59.0 in | Wt 112.0 lb

## 2020-04-23 DIAGNOSIS — K5730 Diverticulosis of large intestine without perforation or abscess without bleeding: Secondary | ICD-10-CM | POA: Diagnosis not present

## 2020-04-23 DIAGNOSIS — K5080 Crohn's disease of both small and large intestine without complications: Secondary | ICD-10-CM

## 2020-04-23 DIAGNOSIS — K6389 Other specified diseases of intestine: Secondary | ICD-10-CM | POA: Diagnosis not present

## 2020-04-23 DIAGNOSIS — K5090 Crohn's disease, unspecified, without complications: Secondary | ICD-10-CM | POA: Diagnosis not present

## 2020-04-23 MED ORDER — SODIUM CHLORIDE 0.9 % IV SOLN: 500.0000 mL | Freq: Once | INTRAVENOUS | Status: DC

## 2020-04-23 NOTE — Op Note (Signed)
[facility] [place] Patient Name: Tammy Kennedy Procedure Date: 04/23/2020 2:00 PM MRN: [id number] Endoscopist: Mauri Pole , MD Age: 64 Referring MD:  Date of Birth: January 01, 1956 Gender: Female Account #: [id number] Procedure:                Colonoscopy Indications:              High risk colon cancer surveillance: Crohn's                            colitis of 8 (or more) years duration with                            one-third (or more) of the colon involved Medicines:                Monitored Anesthesia Care Procedure:                Pre-Anesthesia Assessment:                           - Prior to the procedure, a History and Physical                            was performed, and patient medications and                            allergies were reviewed. The patient's tolerance of                            previous anesthesia was also reviewed. The risks                            and benefits of the procedure and the sedation                            options and risks were discussed with the patient.                            All questions were answered, and informed consent                            was obtained. Prior Anticoagulants: The patient has                            taken no previous anticoagulant or antiplatelet                            agents. ASA Grade Assessment: III - A patient with                            severe systemic disease. After reviewing the risks                            and benefits, the patient was deemed in  satisfactory condition to undergo the procedure.                           After obtaining informed consent, the colonoscope                            was passed under direct vision. Throughout the                            procedure, the patient's blood pressure, pulse, and                            oxygen saturations were monitored continuously. The                            Colonoscope was  introduced through the anus and                            advanced to the the cecum, identified by                            appendiceal orifice and ileocecal valve. The                            colonoscopy was performed without difficulty. The                            patient tolerated the procedure well. The quality                            of the bowel preparation was good. The ileocecal                            valve, appendiceal orifice, and rectum were                            photographed. Scope In: 2:17:33 PM Scope Out: 2:30:00 PM Scope Withdrawal Time: 0 hours 7 minutes 25 seconds  Total Procedure Duration: 0 hours 12 minutes 27 seconds  Findings:                 The perianal and digital rectal examinations were                            normal.                           Inflammation characterized by loss of vascularity                            and scarring was found as patches surrounded by                            normal mucosa in the rectum, in the recto-sigmoid  colon, in the descending colon, in the transverse                            colon, in the ascending colon, at the cecum and in                            the terminal ileum. This was mild in severity, and                            when compared to previous examinations, the                            findings are quiescent. Biopsies were taken with a                            cold forceps for histology. One biopsy was taken                            every 10 cm with a cold forceps for Crohn's disease                            surveillance. These biopsy specimens were sent to                            Pathology.                           Multiple small and large-mouthed diverticula were                            found in the sigmoid colon, descending colon,                            transverse colon, ascending colon and cecum. Complications:            No immediate  complications. Estimated Blood Loss:     Estimated blood loss was minimal. Impression:               - Crohn's disease, with ileitis and colitis and                            Crohn's disease, in remission. Inflammation was                            found in the rectum, in the recto-sigmoid colon, in                            the descending colon, in the transverse colon, in                            the ascending colon, at the cecum and in the  terminal ileum. This was mild in severity,                            quiescent compared to previous examinations.                            Biopsied.                           - Diverticulosis in the sigmoid colon, in the                            descending colon, in the transverse colon, in the                            ascending colon and in the cecum. Recommendation:           - Patient has a contact number available for                            emergencies. The signs and symptoms of potential                            delayed complications were discussed with the                            patient. Return to normal activities tomorrow.                            Written discharge instructions were provided to the                            patient.                           - Resume previous diet.                           - Continue present medications.                           - Await pathology results.                           - Repeat colonoscopy in 3 years for surveillance                            based on pathology results.                           - Return to GI clinic in 3 months. Mauri Pole, MD 04/23/2020 2:38:25 PM This report has been signed electronically.

## 2020-04-23 NOTE — Progress Notes (Signed)
No problems noted in the recovery room. maw 

## 2020-04-23 NOTE — Progress Notes (Signed)
pt tolerated well. VSS. awake and to recovery. Report given to RN.  

## 2020-04-23 NOTE — Patient Instructions (Addendum)
Handout was given to you on diverticulosis. You may resume your current medications today. Await biopsy results.  It normally takes 2-3 weeks for you to receive your pathology results.  Repeat colonoscopy in 3 years for surveillance based on pathology results. The office will call you with an appointment to be seen in the office in 3 months. Please call if any questions or concerns.     YOU HAD AN ENDOSCOPIC PROCEDURE TODAY AT [facility]:   Refer to the procedure report that was given to you for any specific questions about what was found during the examination.  If the procedure report does not answer your questions, please call your gastroenterologist to clarify.  If you requested that your care partner not be given the details of your procedure findings, then the procedure report has been included in a sealed envelope for you to review at your convenience later.  YOU SHOULD EXPECT: Some feelings of bloating in the abdomen. Passage of more gas than usual.  Walking can help get rid of the air that was put into your GI tract during the procedure and reduce the bloating. If you had a lower endoscopy (such as a colonoscopy or flexible sigmoidoscopy) you may notice spotting of blood in your stool or on the toilet paper. If you underwent a bowel prep for your procedure, you may not have a normal bowel movement for a few days.  Please Note:  You might notice some irritation and congestion in your nose or some drainage.  This is from the oxygen used during your procedure.  There is no need for concern and it should clear up in a day or so.  SYMPTOMS TO REPORT IMMEDIATELY:   Following lower endoscopy (colonoscopy or flexible sigmoidoscopy):  Excessive amounts of blood in the stool  Significant tenderness or worsening of abdominal pains  Swelling of the abdomen that is new, acute  Fever of 100F or higher   For urgent or emergent issues, a gastroenterologist can be reached at any  hour by calling [phone]. Do not use MyChart messaging for urgent concerns.    DIET:  We do recommend a small meal at first, but then you may proceed to your regular diet.  Drink plenty of fluids but you should avoid alcoholic beverages for 24 hours.  ACTIVITY:  You should plan to take it easy for the rest of today and you should NOT DRIVE or use heavy machinery until tomorrow (because of the sedation medicines used during the test).    FOLLOW UP: Our staff will call the number listed on your records 48-72 hours following your procedure to check on you and address any questions or concerns that you may have regarding the information given to you following your procedure. If we do not reach you, we will leave a message.  We will attempt to reach you two times.  During this call, we will ask if you have developed any symptoms of COVID 19. If you develop any symptoms (ie: fever, flu-like symptoms, shortness of breath, cough etc.) before then, please call [phone].  If you test positive for Covid 19 in the 2 weeks post procedure, please call and report this information to [place].    If any biopsies were taken you will be contacted by phone or by letter within the next 1-3 weeks.  Please call us at [phone] if you have not heard about the biopsies in 3 weeks.    SIGNATURES/CONFIDENTIALITY: You and/or your care  partner have signed paperwork which will be entered into your electronic medical record.  These signatures attest to the fact that that the information above on your After Visit Summary has been reviewed and is understood.  Full responsibility of the confidentiality of this discharge information lies with you and/or your care-partner.

## 2020-04-23 NOTE — Progress Notes (Signed)
VS by CW  Pt's states no medical or surgical changes since previsit or office visit.  

## 2020-04-25 ENCOUNTER — Telehealth: Payer: Self-pay | Admitting: *Deleted

## 2020-04-25 NOTE — Telephone Encounter (Signed)
  Follow up Call-  Call back number 04/23/2020  Post procedure Call Back phone  # [phone]  Permission to leave phone message Yes  Some recent data might be hidden     Patient questions:  Do you have a fever, pain , or abdominal swelling? No. Pain Score  0 *  Have you tolerated food without any problems? Yes.    Have you been able to return to your normal activities? Yes.    Do you have any questions about your discharge instructions: Diet   No. Medications  No. Follow up visit  No.  Do you have questions or concerns about your Care? No.  Actions: * If pain score is 4 or above: No action needed, pain <4.  1. Have you developed a fever since your procedure? NO  2.   Have you had an respiratory symptoms (SOB or cough) since your procedure? NO  3.   Have you tested positive for COVID 19 since your procedure NO  4.   Have you had any family members/close contacts diagnosed with the COVID 19 since your procedure?  NO   If yes to any of these questions please route to Joylene John, RN and Erenest Rasher, RN

## 2020-04-28 ENCOUNTER — Other Ambulatory Visit: Payer: Self-pay | Admitting: Gastroenterology

## 2020-05-02 ENCOUNTER — Encounter: Payer: Self-pay | Admitting: Gastroenterology

## 2020-05-15 ENCOUNTER — Other Ambulatory Visit: Payer: Self-pay | Admitting: Internal Medicine

## 2020-05-22 ENCOUNTER — Other Ambulatory Visit: Payer: Self-pay | Admitting: Internal Medicine

## 2020-05-23 ENCOUNTER — Telehealth: Payer: Self-pay

## 2020-05-23 NOTE — Telephone Encounter (Signed)
Key: [id number])  Your information has been submitted to [place]. [employer] [place] will review the request and notify you of the determination decision directly, typically within 72 hours of receiving all information.  You will also receive your request decision electronically. To check for an update later, open this request again from your dashboard.  If [employer] has not responded within the specified timeframe or if you have any questions about your PA submission, contact Colbert Riva directly at [phone].

## 2020-06-04 NOTE — Patient Instructions (Addendum)

## 2020-06-04 NOTE — Progress Notes (Signed)
Subjective:    Patient ID: Tammy Kennedy, female    DOB: May 03, 1956, 64 y.o.   MRN: [id number]  HPI She is here for a physical exam.   Overall she feels fine except for increased stress.  Her roommate, bipolar and is an alcoholic.  She feels better since she is retired and is less stressed on that sounds.  Medications and allergies reviewed with patient and updated if appropriate.  Patient Active Problem List   Diagnosis Date Noted  . Hyperglycemia 04/01/2020  . Lateral epicondylitis of right elbow 11/11/2018  . Basal cell carcinoma (BCC) 01/10/2018  . Gastroesophageal reflux disease 11/12/2017  . Idiopathic scoliosis 05/14/2016  . Alcohol abuse 05/14/2016  . Rectovaginal fistula   . C. difficile colitis   . Crohn's disease ([place]) 05/27/2009  . HEPATOMEGALY, HX OF 08/31/2008  . Hypothyroidism 07/30/2008  . Hypertriglyceridemia 07/30/2008  . Anxiety state 07/30/2008  . Depression 07/30/2008  . RENAL CYST 07/30/2008  . DEGENERATIVE DISC DISEASE 07/30/2008  . Osteopenia 07/30/2008    Current Outpatient Medications on File Prior to Visit  Medication Sig Dispense Refill  . azaTHIOprine (IMURAN) 50 MG tablet TAKE 2 TABLETS( 150 MG) BY MOUTH DAILY 60 tablet 2  . calcium-vitamin D (OSCAL WITH D) 500-200 MG-UNIT tablet Take 2 tablets by mouth daily.    . folic acid (FOLVITE) 1 MG tablet Take 2 mg by mouth daily.    . mesalamine (APRISO) 0.375 g 24 hr capsule Take 2 capsules (0.75 g total) by mouth daily. 180 capsule 3  . Potassium Gluconate 550 MG TABS Take 2 tablets by mouth daily.     . Sennosides (EX-LAX PO) Take by mouth as directed.    . Thiamine HCl 50 MG CAPS Take 1 capsule (50 mg total) by mouth daily. 90 capsule 3  . vitamin B-12 (CYANOCOBALAMIN) 500 MCG tablet Take 500 mcg by mouth daily.       No current facility-administered medications on file prior to visit.    Past Medical History:  Diagnosis Date  . Alcohol abuse 05/14/2016  . ALCOHOL ABUSE, HX OF   .  ANEMIA   . ANXIETY DISORDER   . Basal cell carcinoma (BCC) 01/10/2018  . C. difficile colitis   . CROHN'S DISEASE   . DEPRESSION   . DRY EYE SYNDROME   . HEPATOMEGALY, HX OF   . HYPERLIPIDEMIA    intol of statin trials  . HYPOTHYROIDISM   . OSTEOPENIA   . Osteoporosis 05/2017   states Pre osteoporosis 8/18  . Rectovaginal fistula 2012   chron's disease  . Tubular adenoma 2013    Past Surgical History:  Procedure Laterality Date  . ANAL FISSURE REPAIR    . CATARACT EXTRACTION    . EYE SURGERY  11/2017   macular pucker  . HEMORRHOID SURGERY    . Left thumb surgery  1989  . MOHS SURGERY  2008   Nose BCC  . Theraputic abortion      Social History   Socioeconomic History  . Marital status: Divorced    Spouse name: Not on file  . Number of children: Not on file  . Years of education: Not on file  . Highest education level: Not on file  Occupational History  . Not on file  Tobacco Use  . Smoking status: Never Smoker  . Smokeless tobacco: Never Used  Vaping Use  . Vaping Use: Never used  Substance and Sexual Activity  . Alcohol use: Yes  Comment: has drank excessively, not drinking daily  . Drug use: No  . Sexual activity: Not on file  Other Topics Concern  . 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 Difficulty of Paying Living Expenses: Not on file  Food Insecurity:   . Worried About Charity fundraiser in the Last Year: Not on file  . Ran Out of Food in the Last Year: Not on file  Transportation Needs:   . Lack of Transportation (Medical): Not on file  . Lack of Transportation (Non-Medical): Not on file  Physical Activity:   . Days of Exercise per Week: Not on file  . Minutes of Exercise per Session: Not on file  Stress:   . Feeling of Stress : Not on file  Social Connections:   . Frequency of Communication with Friends and  Family: Not on file  . Frequency of Social Gatherings with Friends and Family: Not on file  . Attends Religious Services: Not on file  . Active Member of Clubs or Organizations: Not on file  . Attends Archivist Meetings: Not on file  . Marital Status: Not on file    Family History  Adopted: Yes  Problem Relation Age of Onset  . Breast cancer Neg Hx     Review of Systems  Constitutional: Negative for chills and fever.  Eyes: Negative for visual disturbance.  Respiratory: Negative for cough, shortness of breath and wheezing.   Cardiovascular: Negative for chest pain, palpitations and leg swelling.  Gastrointestinal: Positive for diarrhea. Negative for abdominal pain, blood in stool, constipation and nausea.       No gerd  Genitourinary: Negative for dysuria and hematuria.  Musculoskeletal: Positive for arthralgias (L big toe, hips). Negative for back pain.  Skin: Negative for rash.  Neurological: Positive for dizziness (often with quick movements). Negative for light-headedness, numbness and headaches.       Balance is pretty good  Psychiatric/Behavioral: Positive for dysphoric mood. The patient is nervous/anxious.        Objective:   Vitals:   06/05/20 0901  BP: 122/76  Pulse: 98  Temp: 98.3 F (36.8 C)  SpO2: 97%   Filed Weights   06/05/20 0901  Weight: 114 lb (51.7 kg)   Body mass index is 23.03 kg/m.  BP Readings from Last 3 Encounters:  06/05/20 122/76  04/23/20 99/60  03/01/20 114/70    Wt Readings from Last 3 Encounters:  06/05/20 114 lb (51.7 kg)  04/23/20 112 lb (50.8 kg)  03/01/20 112 lb 9.6 oz (51.1 kg)     Physical Exam Constitutional: She appears well-developed and well-nourished. No distress.  HENT:  Head: Normocephalic and atraumatic.  Right Ear: External ear normal. Normal ear canal and TM Left Ear: External ear normal.  Normal ear canal and TM Mouth/Throat: Oropharynx is clear and moist.  Eyes: Conjunctivae and EOM are  normal.  Neck: Neck supple. No tracheal deviation present. No thyromegaly present.  No carotid bruit  Cardiovascular: Normal rate, regular rhythm and normal heart sounds.   No murmur heard.  No edema. Pulmonary/Chest: Effort normal and breath sounds normal. No respiratory distress. She has no wheezes. She has no rales.  Breast: deferred   Abdominal: Soft. She exhibits no distension. There is no tenderness.  Lymphadenopathy: She has no cervical adenopathy.  Skin: Skin is warm and dry. She is  not diaphoretic.  Psychiatric: She has a normal mood and affect. Her behavior is normal.        Assessment & Plan:   Physical exam: Screening blood work    ordered Immunizations  Had Covid,  rec flu Colonoscopy  Up to date  Mammogram  Up to date  Gyn  No longer goes Dexa  Up to date  Eye exams  Up to date  Exercise  None, but needs to Weight  Normal BMI Substance abuse   Alcohol use - drinks about 1/5 per week -- was drinking 10 times more at one point  See Problem List for Assessment and Plan of chronic medical problems.   This visit occurred during the SARS-CoV-2 public health emergency.  Safety protocols were in place, including screening questions prior to the visit, additional usage of staff PPE, and extensive cleaning of exam room while observing appropriate contact time as indicated for disinfecting solutions.

## 2020-06-05 ENCOUNTER — Encounter: Payer: Self-pay | Admitting: Internal Medicine

## 2020-06-05 ENCOUNTER — Ambulatory Visit (INDEPENDENT_AMBULATORY_CARE_PROVIDER_SITE_OTHER): Payer: BC Managed Care – PPO | Admitting: Internal Medicine

## 2020-06-05 ENCOUNTER — Other Ambulatory Visit: Payer: Self-pay

## 2020-06-05 VITALS — BP 122/76 | HR 98 | Temp 98.3°F | Ht 59.0 in | Wt 114.0 lb

## 2020-06-05 DIAGNOSIS — Z0000 Encounter for general adult medical examination without abnormal findings: Secondary | ICD-10-CM | POA: Diagnosis not present

## 2020-06-05 DIAGNOSIS — M85852 Other specified disorders of bone density and structure, left thigh: Secondary | ICD-10-CM | POA: Diagnosis not present

## 2020-06-05 DIAGNOSIS — F1010 Alcohol abuse, uncomplicated: Secondary | ICD-10-CM

## 2020-06-05 DIAGNOSIS — F411 Generalized anxiety disorder: Secondary | ICD-10-CM

## 2020-06-05 DIAGNOSIS — E781 Pure hyperglyceridemia: Secondary | ICD-10-CM

## 2020-06-05 DIAGNOSIS — E039 Hypothyroidism, unspecified: Secondary | ICD-10-CM | POA: Diagnosis not present

## 2020-06-05 DIAGNOSIS — R739 Hyperglycemia, unspecified: Secondary | ICD-10-CM

## 2020-06-05 DIAGNOSIS — K50919 Crohn's disease, unspecified, with unspecified complications: Secondary | ICD-10-CM | POA: Diagnosis not present

## 2020-06-05 DIAGNOSIS — F3289 Other specified depressive episodes: Secondary | ICD-10-CM

## 2020-06-05 MED ORDER — FENOFIBRATE 160 MG PO TABS: 160.0000 mg | ORAL_TABLET | Freq: Every day | ORAL | 3 refills | Status: DC

## 2020-06-05 MED ORDER — LEVOTHYROXINE SODIUM 88 MCG PO TABS: ORAL_TABLET | ORAL | 0 refills | Status: DC

## 2020-06-05 MED ORDER — VENLAFAXINE HCL ER 150 MG PO CP24: ORAL_CAPSULE | ORAL | 1 refills | Status: DC

## 2020-06-05 MED ORDER — VENLAFAXINE HCL ER 75 MG PO CP24: ORAL_CAPSULE | ORAL | 0 refills | Status: DC

## 2020-06-05 MED ORDER — VALACYCLOVIR HCL 500 MG PO TABS: ORAL_TABLET | ORAL | 1 refills | Status: DC

## 2020-06-05 NOTE — Assessment & Plan Note (Signed)
Chronic Management per Dr Silverio Decamp

## 2020-06-05 NOTE — Assessment & Plan Note (Signed)
chornic dexa up to date Not exercising - will try to walk Taking calcium, vitamin d

## 2020-06-05 NOTE — Assessment & Plan Note (Signed)
Chronic Check lipid panel  Continue daily fibrate Regular exercise and healthy diet encouraged

## 2020-06-05 NOTE — Assessment & Plan Note (Signed)
Check A1c. 

## 2020-06-05 NOTE — Assessment & Plan Note (Signed)
Chronic Fairly controlled, but stress level is elevated Continue Effexor current dose

## 2020-06-05 NOTE — Assessment & Plan Note (Signed)
Chronic  Clinically euthyroid Check tsh  Titrate med dose if needed  

## 2020-06-05 NOTE — Assessment & Plan Note (Signed)
Chronic She has decreased how much alcohol she is drinking, but is still drinking and has a history of abuse Advised keeping alcohol to minimum

## 2020-06-05 NOTE — Assessment & Plan Note (Signed)
Chronic Has increased stress and has good days and bad days, but depression is fairly controlled She does not want to make any changes to her medications Continue Effexor at current dose

## 2020-06-06 LAB — COMPREHENSIVE METABOLIC PANEL
AG Ratio: 2 (calc) (ref 1.0–2.5)
ALT: 9 U/L (ref 6–29)
AST: 18 U/L (ref 10–35)
Albumin: 4.5 g/dL (ref 3.6–5.1)
Alkaline phosphatase (APISO): 41 U/L (ref 37–153)
BUN: 17 mg/dL (ref 7–25)
CO2: 27 mmol/L (ref 20–32)
Calcium: 10.3 mg/dL (ref 8.6–10.4)
Chloride: 106 mmol/L (ref 98–110)
Creat: 0.86 mg/dL (ref 0.50–0.99)
Globulin: 2.3 g/dL (calc) (ref 1.9–3.7)
Glucose, Bld: 77 mg/dL (ref 65–99)
Potassium: 4.4 mmol/L (ref 3.5–5.3)
Sodium: 142 mmol/L (ref 135–146)
Total Bilirubin: 0.4 mg/dL (ref 0.2–1.2)
Total Protein: 6.8 g/dL (ref 6.1–8.1)

## 2020-06-06 LAB — CBC WITH DIFFERENTIAL/PLATELET
Absolute Monocytes: 180 cells/uL — ABNORMAL LOW (ref 200–950)
Basophils Absolute: 31 cells/uL (ref 0–200)
Basophils Relative: 1.4 %
Eosinophils Absolute: 51 cells/uL (ref 15–500)
Eosinophils Relative: 2.3 %
HCT: 37 % (ref 35.0–45.0)
Hemoglobin: 12.7 g/dL (ref 11.7–15.5)
Lymphs Abs: 794 cells/uL — ABNORMAL LOW (ref 850–3900)
MCH: 37.1 pg — ABNORMAL HIGH (ref 27.0–33.0)
MCHC: 34.3 g/dL (ref 32.0–36.0)
MCV: 108.2 fL — ABNORMAL HIGH (ref 80.0–100.0)
MPV: 10.4 fL (ref 7.5–12.5)
Monocytes Relative: 8.2 %
Neutro Abs: 1144 cells/uL — ABNORMAL LOW (ref 1500–7800)
Neutrophils Relative %: 52 %
Platelets: 363 10*3/uL (ref 140–400)
RBC: 3.42 10*6/uL — ABNORMAL LOW (ref 3.80–5.10)
RDW: 13.8 % (ref 11.0–15.0)
Total Lymphocyte: 36.1 %
WBC: 2.2 10*3/uL — ABNORMAL LOW (ref 3.8–10.8)

## 2020-06-06 LAB — HEMOGLOBIN A1C
Hgb A1c MFr Bld: 5.4 % of total Hgb (ref <5.7)
Mean Plasma Glucose: 108 (calc)
eAG (mmol/L): 6 (calc)

## 2020-06-06 LAB — LIPID PANEL
Cholesterol: 218 mg/dL — ABNORMAL HIGH (ref <200)
HDL: 70 mg/dL (ref >=50)
LDL Cholesterol (Calc): 108 mg/dL (calc) — ABNORMAL HIGH
Non-HDL Cholesterol (Calc): 148 mg/dL (calc) — ABNORMAL HIGH (ref <130)
Total CHOL/HDL Ratio: 3.1 (calc) (ref <5.0)
Triglycerides: 278 mg/dL — ABNORMAL HIGH (ref <150)

## 2020-06-06 LAB — TSH: TSH: 0.05 mIU/L — ABNORMAL LOW (ref 0.40–4.50)

## 2020-06-07 ENCOUNTER — Other Ambulatory Visit: Payer: Self-pay | Admitting: Internal Medicine

## 2020-06-07 DIAGNOSIS — E039 Hypothyroidism, unspecified: Secondary | ICD-10-CM

## 2020-06-07 MED ORDER — LEVOTHYROXINE SODIUM 75 MCG PO TABS: 75.0000 ug | ORAL_TABLET | Freq: Every day | ORAL | 0 refills | Status: DC

## 2020-06-12 ENCOUNTER — Encounter: Payer: Self-pay | Admitting: Internal Medicine

## 2020-06-20 DIAGNOSIS — C44311 Basal cell carcinoma of skin of nose: Secondary | ICD-10-CM | POA: Diagnosis not present

## 2020-07-19 ENCOUNTER — Other Ambulatory Visit: Payer: Self-pay | Admitting: Internal Medicine

## 2020-07-22 ENCOUNTER — Other Ambulatory Visit: Payer: Self-pay | Admitting: Internal Medicine

## 2020-07-29 ENCOUNTER — Other Ambulatory Visit: Payer: Self-pay

## 2020-07-29 ENCOUNTER — Ambulatory Visit (INDEPENDENT_AMBULATORY_CARE_PROVIDER_SITE_OTHER): Payer: BC Managed Care – PPO | Admitting: Family Medicine

## 2020-07-29 ENCOUNTER — Encounter: Payer: Self-pay | Admitting: Family Medicine

## 2020-07-29 ENCOUNTER — Ambulatory Visit: Payer: Self-pay

## 2020-07-29 VITALS — BP 120/82 | HR 91 | Ht 59.0 in | Wt 114.0 lb

## 2020-07-29 DIAGNOSIS — M25561 Pain in right knee: Secondary | ICD-10-CM

## 2020-07-29 DIAGNOSIS — G8929 Other chronic pain: Secondary | ICD-10-CM

## 2020-07-29 NOTE — Patient Instructions (Signed)
Good to see you Exercises for the knee Ice regularly Spenco orthotics "total support" Ascis and soucony walking shoes See me again in 5-6 weeks we will consider xray, PT and injection

## 2020-07-29 NOTE — Progress Notes (Signed)
[facility] [address] [place] Phone: [phone] Subjective:   I Kandace Blitz am serving as a Education administrator for Dr. Hulan Saas.  This visit occurred during the SARS-CoV-2 public health emergency.  Safety protocols were in place, including screening questions prior to the visit, additional usage of staff PPE, and extensive cleaning of exam room while observing appropriate contact time as indicated for disinfecting solutions.   I'm seeing this patient by the request  of:  Binnie Rail, MD  CC: Right-sided knee pain  [id number]   12/14/2018 Significant improvement from previous exam.  Encourage patient is still decrease the amount of wrist extension.  Continue the nitroglycerin as long as patient is not having any side effects for some time here.  Patient then is going to see me again in 4 weeks to make sure completely healed.   Update 07/29/2020 Tammy Kennedy is a 64 y.o. female coming in with complaint of right knee pain. Believes the knee may have arthritis. States she feels something pulling at the hip. Knee is stiff and weak.   Onset- 2 months  Location - medial Duration- worse in the morning  Character- sharp Aggravating factors- walking, stairs  Reliving factors-  Therapies tried- advil Severity- 5/10 at it worse      Past Medical History:  Diagnosis Date  . Alcohol abuse 05/14/2016  . ALCOHOL ABUSE, HX OF   . ANEMIA   . ANXIETY DISORDER   . Basal cell carcinoma (BCC) 01/10/2018  . C. difficile colitis   . CROHN'S DISEASE   . DEPRESSION   . DRY EYE SYNDROME   . HEPATOMEGALY, HX OF   . HYPERLIPIDEMIA    intol of statin trials  . HYPOTHYROIDISM   . OSTEOPENIA   . Osteoporosis 05/2017   states Pre osteoporosis 8/18  . Rectovaginal fistula 2012   chron's disease  . Tubular adenoma 2013   Past Surgical History:  Procedure Laterality Date  . ANAL FISSURE REPAIR    . CATARACT EXTRACTION    . EYE SURGERY   11/2017   macular pucker  . HEMORRHOID SURGERY    . Left thumb surgery  1989  . MOHS SURGERY  2008   Nose BCC  . Theraputic abortion     Social History   Socioeconomic History  . Marital status: Divorced    Spouse name: Not on file  . Number of children: Not on file  . Years of education: Not on file  . Highest education level: Not on file  Occupational History  . Not on file  Tobacco Use  . Smoking status: Never Smoker  . Smokeless tobacco: Never Used  Vaping Use  . Vaping Use: Never used  Substance and Sexual Activity  . Alcohol use: Yes    Comment: has drank excessively, not drinking daily  . Drug use: No  . Sexual activity: Not on file  Other Topics Concern  . 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 Difficulty of Paying Living Expenses: Not on file  Food Insecurity:   . Worried About Charity fundraiser in the Last Year: Not on file  . Ran Out of Food in the Last Year: Not on file  Transportation Needs:   . Lack of Transportation (Medical): Not on file  . Lack of Transportation (Non-Medical): Not on  file  Physical Activity:   . Days of Exercise per Week: Not on file  . Minutes of Exercise per Session: Not on file  Stress:   . Feeling of Stress : Not on file  Social Connections:   . Frequency of Communication with Friends and Family: Not on file  . Frequency of Social Gatherings with Friends and Family: Not on file  . Attends Religious Services: Not on file  . Active Member of Clubs or Organizations: Not on file  . Attends Archivist Meetings: Not on file  . Marital Status: Not on file   No Known Allergies Family History  Adopted: Yes  Problem Relation Age of Onset  . Breast cancer Neg Hx     Current Outpatient Medications (Endocrine & Metabolic):  .  levothyroxine (SYNTHROID) 75 MCG tablet, Take 1 tablet (75 mcg  total) by mouth daily before breakfast.  Current Outpatient Medications (Cardiovascular):  .  fenofibrate 160 MG tablet, Take 1 tablet (160 mg total) by mouth daily.    Current Outpatient Medications (Hematological):  .  folic acid (FOLVITE) 1 MG tablet, Take 2 mg by mouth daily. .  vitamin B-12 (CYANOCOBALAMIN) 500 MCG tablet, Take 500 mcg by mouth daily.    Current Outpatient Medications (Other):  .  azaTHIOprine (IMURAN) 50 MG tablet, TAKE 2 TABLETS( 150 MG) BY MOUTH DAILY .  calcium-vitamin D (OSCAL WITH D) 500-200 MG-UNIT tablet, Take 2 tablets by mouth daily. .  mesalamine (APRISO) 0.375 g 24 hr capsule, Take 2 capsules (0.75 g total) by mouth daily. .  Potassium Gluconate 550 MG TABS, Take 2 tablets by mouth daily.  .  Sennosides (EX-LAX PO), Take by mouth as directed. .  Thiamine HCl 50 MG CAPS, Take 1 capsule (50 mg total) by mouth daily. .  valACYclovir (VALTREX) 500 MG tablet, TAKE 1 TABLET(500 MG) BY MOUTH DAILY .  venlafaxine XR (EFFEXOR-XR) 150 MG 24 hr capsule, TAKE 1 CAPSULE BY MOUTH EVERY MORNING WITH BREAKFAST .  venlafaxine XR (EFFEXOR-XR) 75 MG 24 hr capsule, TAKE 1 CAPSULE BY MOUTH DAILY WITH BREAKFAST IN ADDITION TO 150 MG DAILY   Reviewed prior external information including notes and imaging from  primary care provider As well as notes that were available from care everywhere and other healthcare systems.  Past medical history, social, surgical and family history all reviewed in electronic medical record.  No pertanent information unless stated regarding to the chief complaint.   Review of Systems:  No headache, visual changes, nausea, vomiting, diarrhea, constipation, dizziness, abdominal pain, skin rash, fevers, chills, night sweats, weight loss, swollen lymph nodes, body aches, joint swelling, chest pain, shortness of breath, mood changes. POSITIVE muscle aches  Objective  Blood pressure 120/82, pulse 91, height 4' 11"  (1.499 m), weight 114 lb (51.7 kg),  last menstrual period 10/12/2005, SpO2 97 %.   General: No apparent distress alert and oriented x3 mood and affect normal, dressed appropriately.  HEENT: Pupils equal, extraocular movements intact  Respiratory: Patient's speak in full sentences and does not appear short of breath  Cardiovascular: No lower extremity edema, non tender, no erythema  Neuro: Cranial nerves II through XII are intact, neurovascularly intact in all extremities with 2+ DTRs and 2+ pulses.  Gait normal with good balance and coordination.  MSK: Right knee shows the patient does have near full range of motion.  Mild pain over the medial joint space, patient does have pain in the Pez anserine area.  Mild crepitus noted with range  of motion and mild lateral tracking of patella  Limited musculoskeletal ultrasound was performed and interpreted by Lyndal Pulley  Limited ultrasound of patient's right knee shows that there is some very mild narrowing of the patellofemoral and the medial joint space.  Patient does have a degenerative medial meniscal tear but no true displacement. Impression: Mild arthritic changes and degenerative tearing of the meniscus no displacement   Impression and Recommendations:     The above documentation has been reviewed and is accurate and complete Lyndal Pulley, DO

## 2020-07-29 NOTE — Assessment & Plan Note (Addendum)
Seems with right knee pain.  Mild arthritic changes, will hold on x-rays today.  Seen on more the ultrasound very mild.  We discussed with patient the proper walking shoes, over-the-counter medications, icing regimen.  Patient will increase activity slowly.  Follow-up with me again in 4 to 8 weeks worsening pain will consider injection, formal physical therapy AND XRAY

## 2020-08-06 ENCOUNTER — Other Ambulatory Visit: Payer: Self-pay | Admitting: Internal Medicine

## 2020-08-08 ENCOUNTER — Telehealth: Payer: Self-pay | Admitting: Internal Medicine

## 2020-08-08 NOTE — Telephone Encounter (Signed)
Message left for patient that refill given back on 06/05/20 should still have one refill left on it and she would need to contact her pharmacy to refill.

## 2020-08-08 NOTE — Telephone Encounter (Signed)
valACYclovir (VALTREX) 500 MG tablet [id number] - [place], [place] AT [place] Phone:  [phone]  Fax:  [phone]     Patient is wondering why this medication was declined

## 2020-08-09 ENCOUNTER — Other Ambulatory Visit: Payer: Self-pay | Admitting: Gastroenterology

## 2020-09-19 ENCOUNTER — Other Ambulatory Visit: Payer: Self-pay | Admitting: Internal Medicine

## 2020-09-25 ENCOUNTER — Other Ambulatory Visit: Payer: Self-pay | Admitting: Internal Medicine

## 2020-10-15 ENCOUNTER — Ambulatory Visit: Payer: BC Managed Care – PPO | Admitting: Gastroenterology

## 2020-10-15 ENCOUNTER — Other Ambulatory Visit (INDEPENDENT_AMBULATORY_CARE_PROVIDER_SITE_OTHER): Payer: BC Managed Care – PPO

## 2020-10-15 ENCOUNTER — Encounter: Payer: Self-pay | Admitting: Gastroenterology

## 2020-10-15 VITALS — BP 100/80 | HR 80 | Ht 59.0 in | Wt 114.5 lb

## 2020-10-15 DIAGNOSIS — K5080 Crohn's disease of both small and large intestine without complications: Secondary | ICD-10-CM

## 2020-10-15 DIAGNOSIS — D84821 Immunodeficiency due to drugs: Secondary | ICD-10-CM | POA: Diagnosis not present

## 2020-10-15 DIAGNOSIS — E538 Deficiency of other specified B group vitamins: Secondary | ICD-10-CM

## 2020-10-15 DIAGNOSIS — K581 Irritable bowel syndrome with constipation: Secondary | ICD-10-CM

## 2020-10-15 DIAGNOSIS — D72819 Decreased white blood cell count, unspecified: Secondary | ICD-10-CM

## 2020-10-15 DIAGNOSIS — Z79899 Other long term (current) drug therapy: Secondary | ICD-10-CM

## 2020-10-15 LAB — COMPREHENSIVE METABOLIC PANEL
ALT: 8 U/L (ref 0–35)
AST: 16 U/L (ref 0–37)
Albumin: 4.5 g/dL (ref 3.5–5.2)
Alkaline Phosphatase: 45 U/L (ref 39–117)
BUN: 20 mg/dL (ref 6–23)
CO2: 28 mEq/L (ref 19–32)
Calcium: 10.3 mg/dL (ref 8.4–10.5)
Chloride: 105 mEq/L (ref 96–112)
Creatinine, Ser: 0.86 mg/dL (ref 0.40–1.20)
GFR: 71.41 mL/min (ref >60.00)
Glucose, Bld: 97 mg/dL (ref 70–99)
Potassium: 4.3 mEq/L (ref 3.5–5.1)
Sodium: 140 mEq/L (ref 135–145)
Total Bilirubin: 0.4 mg/dL (ref 0.2–1.2)
Total Protein: 7.1 g/dL (ref 6.0–8.3)

## 2020-10-15 LAB — IBC + FERRITIN
Ferritin: 105.7 ng/mL (ref 10.0–291.0)
Iron: 73 ug/dL (ref 42–145)
Saturation Ratios: 13.8 % — ABNORMAL LOW (ref 20.0–50.0)
Transferrin: 377 mg/dL — ABNORMAL HIGH (ref 212.0–360.0)

## 2020-10-15 LAB — CBC WITH DIFFERENTIAL/PLATELET
Basophils Absolute: 0 10*3/uL (ref 0.0–0.1)
Basophils Relative: 1.3 % (ref 0.0–3.0)
Eosinophils Absolute: 0.1 10*3/uL (ref 0.0–0.7)
Eosinophils Relative: 2.4 % (ref 0.0–5.0)
HCT: 39.1 % (ref 36.0–46.0)
Hemoglobin: 13.4 g/dL (ref 12.0–15.0)
Lymphocytes Relative: 23.7 % (ref 12.0–46.0)
Lymphs Abs: 0.7 10*3/uL (ref 0.7–4.0)
MCHC: 34.2 g/dL (ref 30.0–36.0)
MCV: 106 fl — ABNORMAL HIGH (ref 78.0–100.0)
Monocytes Absolute: 0.3 10*3/uL (ref 0.1–1.0)
Monocytes Relative: 9.3 % (ref 3.0–12.0)
Neutro Abs: 1.9 10*3/uL (ref 1.4–7.7)
Neutrophils Relative %: 63.3 % (ref 43.0–77.0)
Platelets: 268 10*3/uL (ref 150.0–400.0)
RBC: 3.69 Mil/uL — ABNORMAL LOW (ref 3.87–5.11)
RDW: 15.3 % (ref 11.5–15.5)
WBC: 3 10*3/uL — ABNORMAL LOW (ref 4.0–10.5)

## 2020-10-15 LAB — SEDIMENTATION RATE: Sed Rate: 21 mm/hr (ref 0–30)

## 2020-10-15 LAB — VITAMIN B12: Vitamin B-12: 1053 pg/mL — ABNORMAL HIGH (ref 211–911)

## 2020-10-15 LAB — FOLATE: Folate: >23.6 ng/mL (ref >5.9)

## 2020-10-15 NOTE — Progress Notes (Signed)
Tammy Kennedy    [id number]    1955/10/31  Primary Care Physician:Kennedy, Tammy Lick, MD  Referring Physician: Binnie Rail, MD [place],  [place] [address]   Chief complaint: Crohn's disease HPI: 65 year old very pleasant female with history of Crohn's disease involving ileum and colon, history of fistulizing disease with rectovaginal fistula here for follow-up of persistent  Currently on Imuran 100 mg daily.  She is also taking Apriso 2 tablets daily. Continues to have intermittent constipation, taking over-the-counter stool softener, tries  to get more fiber in her diet through fruits and vegetables.  She also drinks plenty of fluids.  She is having bowel movement once daily or every other day.  Denies any nausea, vomiting, abdominal pain, melena or bright red blood per rectum Overall doing well  Colonoscopy April 23, 2020: Mild inactive colitis, quiescent.  Right and left colon biopsies showed benign mucosa with no active inflammation.  Diverticulosis   Outpatient Encounter Medications as of 10/15/2020  Medication Sig  . azaTHIOprine (IMURAN) 50 MG tablet TAKE 2 TABLETS( 150 MG) BY MOUTH DAILY (Patient taking differently: TAKE 2 TABLETS BY MOUTH DAILY)  . calcium-vitamin D (OSCAL WITH D) 500-200 MG-UNIT tablet Take 2 tablets by mouth daily.  . fenofibrate 160 MG tablet Take 1 tablet (160 mg total) by mouth daily.  . folic acid (FOLVITE) 1 MG tablet Take 2 mg by mouth daily.  Marland Kitchen levothyroxine (SYNTHROID) 88 MCG tablet Take 1 tablet by mouth daily.  . mesalamine (APRISO) 0.375 g 24 hr capsule Take 2 capsules (0.75 g total) by mouth daily.  . Potassium Gluconate 550 MG TABS Take 2 tablets by mouth daily.   . Sennosides (EX-LAX PO) Take by mouth as directed.  . Thiamine HCl 50 MG CAPS Take 1 capsule (50 mg total) by mouth daily.  . valACYclovir (VALTREX) 500 MG tablet TAKE 1 TABLET(500 MG) BY MOUTH DAILY  . venlafaxine XR (EFFEXOR-XR) 150 MG 24 hr capsule  TAKE 1 CAPSULE BY MOUTH EVERY MORNING WITH BREAKFAST  . venlafaxine XR (EFFEXOR-XR) 75 MG 24 hr capsule TAKE 1 CAPSULE BY MOUTH DAILY WITH BREAKFAST IN ADDITION TO 150 MG DAILY  . vitamin B-12 (CYANOCOBALAMIN) 500 MCG tablet Take 500 mcg by mouth daily.  . [DISCONTINUED] levothyroxine (SYNTHROID) 75 MCG tablet TAKE 1 TABLET(75 MCG) BY MOUTH DAILY BEFORE BREAKFAST   No facility-administered encounter medications on file as of 10/15/2020.    Allergies as of 10/15/2020  . (No Known Allergies)    Past Medical History:  Diagnosis Date  . Alcohol abuse 05/14/2016  . ALCOHOL ABUSE, HX OF   . ANEMIA   . ANXIETY DISORDER   . Basal cell carcinoma (BCC) 01/10/2018  . C. difficile colitis   . CROHN'S DISEASE   . DEPRESSION   . DRY EYE SYNDROME   . HEPATOMEGALY, HX OF   . HYPERLIPIDEMIA    intol of statin trials  . HYPOTHYROIDISM   . OSTEOPENIA   . Osteoporosis 05/2017   states Pre osteoporosis 8/18  . Rectovaginal fistula 2012   chron's disease  . Tubular adenoma 2013    Past Surgical History:  Procedure Laterality Date  . ANAL FISSURE REPAIR    . CATARACT EXTRACTION    . EYE SURGERY  11/2017   macular pucker  . HEMORRHOID SURGERY    . Left thumb surgery  1989  . MOHS SURGERY  2008   Nose BCC  . Theraputic  abortion      Family History  Adopted: Yes  Problem Relation Age of Onset  . Breast cancer Neg Hx     Social History   Socioeconomic History  . Marital status: Divorced    Spouse name: Not on file  . Number of children: Not on file  . Years of education: Not on file  . Highest education level: Not on file  Occupational History  . Not on file  Tobacco Use  . Smoking status: Never Smoker  . Smokeless tobacco: Never Used  Vaping Use  . Vaping Use: Never used  Substance and Sexual Activity  . Alcohol use: Yes    Comment: has drank excessively, not drinking daily  . Drug use: No  . Sexual activity: Not on file  Other Topics Concern  . 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   Physical Exam: Vitals:   10/15/20 0944  BP: 100/80  Pulse: 80   Body mass index is 23.13 kg/m. Gen:      No acute distress Neuro: alert and oriented x 3 Psych: normal mood and affect  Data Reviewed:  Reviewed labs, radiology imaging, old records and pertinent past GI work up   Assessment and Plan/Recommendations:  65 year old very pleasant female with history of Crohn's disease rectovaginal fistula currently in remission on Imuran and Apriso  Crohn's disease: Inactive colitis based on recent colonoscopy early asymptomatic Continue Apriso 2 tablets daily We will plan to decrease Imuran to 50 mg daily, to decrease potential side effects with long-term use of Imuran.  If continues to remain asymptomatic we will plan to further decrease the dose and taper it off over the next 6 to 12 months  IBS -constipation: Continue with high-fiber diet and increase water intake.  Use stool softener daily as needed   Leukopenia: Secondary to Imuran.  Plan to reduce the dose today to 50 mg daily  B12 deficiency: Continue B12 daily  Check CBC, CMP, B12 and ESR for IBD health maintenance  Return in 6 months or sooner if needed  This visit required 40 minutes of patient care (this includes precharting, chart review, review of results, face-to-face time used for counseling as well as treatment plan and follow-up. The patient was provided an opportunity to ask questions and all were answered. The patient agreed with the plan and demonstrated an understanding  of the instructions.  Damaris Hippo , MD    CC: Binnie Rail, MD

## 2020-10-15 NOTE — Patient Instructions (Signed)
Decrease Imuran to 50 mg daily  Continue Apriso 2 tablets daily   Your provider has requested that you go to the basement level for lab work before leaving today. Press "B" on the elevator. The lab is located at the first door on the left as you exit the elevator.  Follow up in 6 months  Due to recent changes in healthcare laws, you may see the results of your imaging and laboratory studies on MyChart before your provider has had a chance to review them.  We understand that in some cases there may be results that are confusing or concerning to you. Not all laboratory results come back in the same time frame and the provider may be waiting for multiple results in order to interpret others.  Please give [place] 48 hours in order for your provider to thoroughly review all the results before contacting the office for clarification of your results.   I appreciate the  opportunity to care for you  Thank You   Harl Bowie , MD

## 2020-12-03 NOTE — Patient Instructions (Signed)
  Blood work was ordered.     Medications changes include :   none    Please followup in 6 months

## 2020-12-03 NOTE — Progress Notes (Signed)
Subjective:    Patient ID: Tammy Kennedy, female    DOB: 01-17-1956, 65 y.o.   MRN: [id number]  HPI The patient is here for follow up of their chronic medical problems, including anxiety, depression, hypothyroidism      Alcohol use - has decreased her drinking - Can go weeks w/o drinking.  Typically only drinks when she is stressed.    Right hip pain - getting worse.  Saw dr Tamala Julian and did exercises, but not what she should have done.  ? Related to scoliosis.  The pain is more focused to her right hip now.   Medications and allergies reviewed with patient and updated if appropriate.  Patient Active Problem List   Diagnosis Date Noted  . Right knee pain 07/29/2020  . Hyperglycemia 04/01/2020  . Lateral epicondylitis of right elbow 11/11/2018  . Basal cell carcinoma (BCC) 01/10/2018  . Gastroesophageal reflux disease 11/12/2017  . Idiopathic scoliosis 05/14/2016  . Alcohol abuse 05/14/2016  . Rectovaginal fistula   . C. difficile colitis   . Crohn's disease ([place]) 05/27/2009  . HEPATOMEGALY, HX OF 08/31/2008  . Hypothyroidism 07/30/2008  . Hypertriglyceridemia 07/30/2008  . Anxiety 07/30/2008  . Depression 07/30/2008  . RENAL CYST 07/30/2008  . DEGENERATIVE DISC DISEASE 07/30/2008  . Osteopenia 07/30/2008    Current Outpatient Medications on File Prior to Visit  Medication Sig Dispense Refill  . azaTHIOprine (IMURAN) 50 MG tablet TAKE 2 TABLETS( 150 MG) BY MOUTH DAILY (Patient taking differently: TAKE 1 TABLETS BY MOUTH DAILY) 60 tablet 2  . calcium-vitamin D (OSCAL WITH D) 500-200 MG-UNIT tablet Take 2 tablets by mouth daily.    . fenofibrate 160 MG tablet Take 1 tablet (160 mg total) by mouth daily. 90 tablet 3  . folic acid (FOLVITE) 1 MG tablet Take 2 mg by mouth daily.    Marland Kitchen levothyroxine (SYNTHROID) 88 MCG tablet Take 1 tablet by mouth daily.    . mesalamine (APRISO) 0.375 g 24 hr capsule Take 2 capsules (0.75 g total) by mouth daily. 180 capsule 3  . Potassium  Gluconate 550 MG TABS Take 2 tablets by mouth daily.     . Sennosides (EX-LAX PO) Take by mouth as directed.    . Thiamine HCl 50 MG CAPS Take 1 capsule (50 mg total) by mouth daily. 90 capsule 3  . valACYclovir (VALTREX) 500 MG tablet TAKE 1 TABLET(500 MG) BY MOUTH DAILY 90 tablet 1  . venlafaxine XR (EFFEXOR-XR) 150 MG 24 hr capsule TAKE 1 CAPSULE BY MOUTH EVERY MORNING WITH BREAKFAST 90 capsule 1  . venlafaxine XR (EFFEXOR-XR) 75 MG 24 hr capsule TAKE 1 CAPSULE BY MOUTH DAILY WITH BREAKFAST IN ADDITION TO 150 MG DAILY 90 capsule 0  . vitamin B-12 (CYANOCOBALAMIN) 500 MCG tablet Take 500 mcg by mouth daily.     No current facility-administered medications on file prior to visit.    Past Medical History:  Diagnosis Date  . Alcohol abuse 05/14/2016  . ALCOHOL ABUSE, HX OF   . ANEMIA   . ANXIETY DISORDER   . Basal cell carcinoma (BCC) 01/10/2018  . C. difficile colitis   . CROHN'S DISEASE   . DEPRESSION   . DRY EYE SYNDROME   . HEPATOMEGALY, HX OF   . HYPERLIPIDEMIA    intol of statin trials  . HYPOTHYROIDISM   . OSTEOPENIA   . Osteoporosis 05/2017   states Pre osteoporosis 8/18  . Rectovaginal fistula 2012   chron's disease  . Tubular adenoma  2013    Past Surgical History:  Procedure Laterality Date  . ANAL FISSURE REPAIR    . CATARACT EXTRACTION    . EYE SURGERY  11/2017   macular pucker  . HEMORRHOID SURGERY    . Left thumb surgery  1989  . MOHS SURGERY  2008   Nose BCC  . Theraputic abortion      Social History   Socioeconomic History  . Marital status: Divorced    Spouse name: Not on file  . Number of children: Not on file  . Years of education: Not on file  . Highest education level: Not on file  Occupational History  . Not on file  Tobacco Use  . Smoking status: Never Smoker  . Smokeless tobacco: Never Used  Vaping Use  . Vaping Use: Never used  Substance and Sexual Activity  . Alcohol use: Yes    Comment: has drank excessively, not drinking daily  .  Drug use: No  . Sexual activity: Not on file  Other Topics Concern  . 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 Breast cancer Neg Hx     Review of Systems  Constitutional: Negative for fever.  Respiratory: Negative for cough, shortness of breath and wheezing.   Cardiovascular: Negative for chest pain, palpitations and leg swelling.  Musculoskeletal: Positive for arthralgias (R hip).  Neurological: Positive for light-headedness. Negative for headaches.       Objective:   Vitals:   12/04/20 1019  BP: 106/82  Pulse: 84  Temp: 98.1 F (36.7 C)  SpO2: 98%   BP Readings from Last 3 Encounters:  12/04/20 106/82  10/15/20 100/80  07/29/20 120/82   Wt Readings from Last 3 Encounters:  12/04/20 116 lb 6.4 oz (52.8 kg)  10/15/20 114 lb 8 oz (51.9 kg)  07/29/20 114 lb (51.7 kg)   Body mass index is 23.51 kg/m.   Physical Exam    Constitutional: Appears well-developed and well-nourished. No distress.  HENT:  Head: Normocephalic and atraumatic.  Neck: Neck supple. No tracheal deviation present. No thyromegaly present.  No cervical lymphadenopathy Cardiovascular: Normal rate, regular rhythm and normal heart sounds.   No murmur heard. No carotid bruit .  No edema Pulmonary/Chest: Effort normal and breath sounds normal. No respiratory distress. No has no wheezes. No rales.  Skin: Skin is warm and dry. Not diaphoretic.  Psychiatric: Normal mood and affect. Behavior is normal.      Assessment & Plan:    See Problem List for Assessment and Plan of chronic medical problems.    This visit occurred during the SARS-CoV-2  public health emergency.  Safety protocols were in place, including screening questions prior to the visit, additional usage of staff PPE, and extensive cleaning of exam room while observing appropriate contact time as indicated for disinfecting solutions.

## 2020-12-04 ENCOUNTER — Other Ambulatory Visit: Payer: Self-pay

## 2020-12-04 ENCOUNTER — Ambulatory Visit (INDEPENDENT_AMBULATORY_CARE_PROVIDER_SITE_OTHER): Payer: BC Managed Care – PPO | Admitting: Internal Medicine

## 2020-12-04 ENCOUNTER — Encounter: Payer: Self-pay | Admitting: Internal Medicine

## 2020-12-04 VITALS — BP 106/82 | HR 84 | Temp 98.1°F | Ht 59.0 in | Wt 116.4 lb

## 2020-12-04 DIAGNOSIS — F1010 Alcohol abuse, uncomplicated: Secondary | ICD-10-CM

## 2020-12-04 DIAGNOSIS — F419 Anxiety disorder, unspecified: Secondary | ICD-10-CM | POA: Diagnosis not present

## 2020-12-04 DIAGNOSIS — F3289 Other specified depressive episodes: Secondary | ICD-10-CM

## 2020-12-04 DIAGNOSIS — E038 Other specified hypothyroidism: Secondary | ICD-10-CM | POA: Diagnosis not present

## 2020-12-04 LAB — TSH: TSH: 1.96 u[IU]/mL (ref 0.35–4.50)

## 2020-12-04 NOTE — Assessment & Plan Note (Signed)
Chronic Controlled, stable Continue effexor 225 mg daily

## 2020-12-04 NOTE — Assessment & Plan Note (Signed)
Chronic Not drinking excessively at this time - she knows she needs limit her alcohol use Will monitor

## 2020-12-04 NOTE — Assessment & Plan Note (Signed)
Chronic  Clinically euthyroid Currently taking levothyroxine 88 mcg daily Check tsh  Titrate med dose if needed

## 2020-12-14 ENCOUNTER — Other Ambulatory Visit: Payer: Self-pay | Admitting: Gastroenterology

## 2021-01-20 ENCOUNTER — Other Ambulatory Visit: Payer: Self-pay | Admitting: Internal Medicine

## 2021-02-07 ENCOUNTER — Other Ambulatory Visit: Payer: Self-pay | Admitting: Internal Medicine

## 2021-02-12 ENCOUNTER — Other Ambulatory Visit: Payer: Self-pay | Admitting: Internal Medicine

## 2021-02-15 ENCOUNTER — Other Ambulatory Visit: Payer: Self-pay | Admitting: Internal Medicine

## 2021-03-11 ENCOUNTER — Other Ambulatory Visit: Payer: Self-pay | Admitting: Internal Medicine

## 2021-03-11 DIAGNOSIS — Z1231 Encounter for screening mammogram for malignant neoplasm of breast: Secondary | ICD-10-CM

## 2021-03-12 DIAGNOSIS — H5212 Myopia, left eye: Secondary | ICD-10-CM | POA: Diagnosis not present

## 2021-03-15 ENCOUNTER — Other Ambulatory Visit: Payer: Self-pay | Admitting: Gastroenterology

## 2021-03-26 NOTE — Progress Notes (Signed)
[name] [place] Phone: [phone] Subjective:   [name], am serving as a scribe for Dr. Hulan Saas.  I'm seeing this patient by the request  of:  Binnie Rail, MD  CC: back pain   [id number]  Tammy Kennedy is a 65 y.o. female coming in with complaint of back pain. Seen in 2021 for R knee pain. Patient states when she was getting out of bed 6 weeks ago and heard a pop in her mid right back. States over the weeks she was able to notice more of the pain. Patient taking aleve so that she can get work done. Pain is localize that area and if she does not take the aleve she will get spasms and worse in the morning. Patient also states that her R knee/Leg is not any better than before but states she has not been doing her exercises.        Past Medical History:  Diagnosis Date   Alcohol abuse 05/14/2016   ALCOHOL ABUSE, HX OF    ANEMIA    ANXIETY DISORDER    Basal cell carcinoma (BCC) 01/10/2018   C. difficile colitis    CROHN'S DISEASE    DEPRESSION    DRY EYE SYNDROME    HEPATOMEGALY, HX OF    HYPERLIPIDEMIA    intol of statin trials   HYPOTHYROIDISM    OSTEOPENIA    Osteoporosis 05/2017   states Pre osteoporosis 8/18   Rectovaginal fistula 2012   chron's disease   Tubular adenoma 2013   Past Surgical History:  Procedure Laterality Date   ANAL FISSURE REPAIR     CATARACT EXTRACTION     EYE SURGERY  11/2017   macular pucker   HEMORRHOID SURGERY     Left thumb surgery  1989   MOHS SURGERY  2008   Nose BCC   Theraputic abortion     Social History   Socioeconomic History   Marital status: Divorced    Spouse name: Not on file   Number of children: Not on file   Years of education: Not on file   Highest education level: Not on file  Occupational History   Not on file  Tobacco Use   Smoking status: Never   Smokeless tobacco: Never  Vaping Use   Vaping Use: Never used  Substance  and Sexual Activity   Alcohol use: Yes    Comment: has drank excessively, not drinking daily   Drug use: No   Sexual activity: Not on file  Other Topics Concern   Not on file  Social History Narrative   Works at Ha[employer]- [employer]- part time   Social Determinants of Radio broadcast assistant Strain: Not on file  Food Insecurity: Not on file  Transportation Needs: Not on file  Physical Activity: Not on file  Stress: Not on file  Social Connections: Not on file   No Known Allergies Family History  Adopted: Yes  Problem Relation Age of Onset   Breast cancer Neg Hx     Current Outpatient Medications (Endocrine & Metabolic):    levothyroxine (SYNTHROID) 88 MCG tablet, TAKE 1 TABLET BY MOUTH DAILY  Current Outpatient Medications (Cardiovascular):    fenofibrate 160 MG tablet, Take 1 tablet (160 mg total) by mouth daily.    Current Outpatient Medications (Hematological):    folic acid (FOLVITE) 1 MG tablet, Take 2 mg by mouth daily.   vitamin  B-12 (CYANOCOBALAMIN) 500 MCG tablet, Take 500 mcg by mouth daily.  Current Outpatient Medications (Other):    azaTHIOprine (IMURAN) 50 MG tablet, TAKE 2 TABLETS BY MOUTH DAILY   calcium-vitamin D (OSCAL WITH D) 500-200 MG-UNIT tablet, Take 2 tablets by mouth daily.   mesalamine (APRISO) 0.375 g 24 hr capsule, TAKE 2 CAPSULES(0.75 GRAM) BY MOUTH DAILY   Potassium Gluconate 550 MG TABS, Take 2 tablets by mouth daily.    Sennosides (EX-LAX PO), Take by mouth as directed.   Thiamine HCl 50 MG CAPS, Take 1 capsule (50 mg total) by mouth daily.   valACYclovir (VALTREX) 500 MG tablet, TAKE 1 TABLET(500 MG) BY MOUTH DAILY   venlafaxine XR (EFFEXOR-XR) 150 MG 24 hr capsule, TAKE 1 CAPSULE BY MOUTH EVERY MORNING WITH BREAKFAST   venlafaxine XR (EFFEXOR-XR) 75 MG 24 hr capsule, TAKE 1 CAPSULE BY MOUTH DAILY WITH BREAKFAST IN ADDITION TO 150 MG DAILY   Reviewed prior external information including notes and imaging from   primary care provider As well as notes that were available from care everywhere and other healthcare systems.  Past medical history, social, surgical and family history all reviewed in electronic medical record.  No pertanent information unless stated regarding to the chief complaint.   Review of Systems:  No headache, visual changes, nausea, vomiting, diarrhea, constipation, dizziness, abdominal pain, skin rash, fevers, chills, night sweats, weight loss, swollen lymph nodes, body aches, joint swelling, chest pain, shortness of breath, mood changes. POSITIVE muscle aches  Objective  Blood pressure 110/64, pulse 88, height 4' 11"  (1.499 m), weight 115 lb (52.2 kg), last menstrual period 10/12/2005, SpO2 96 %.   General: No apparent distress alert and oriented x3 mood and affect normal, dressed appropriately.  HEENT: Pupils equal, extraocular movements intact  Respiratory: Patient's speak in full sentences and does not appear short of breath  Cardiovascular: No lower extremity edema, non tender, no erythema  Gait mild antalgic Patient's back exam does have significant scoliosis noted.  Patient does have tenderness to palpation on the right side of the spine mostly in the L1-L2 area.  No midline tenderness noted.  Patient does have weakness with hip flexion on the right side compared to left side.  Full range of motion of the hips noted.  Neurovascularly intact distally.  Full strength of plantarflexion and dorsiflexion of both feet   Impression and Recommendations:     The above documentation has been reviewed and is accurate and complete Lyndal Pulley, DO

## 2021-03-27 ENCOUNTER — Ambulatory Visit: Payer: BC Managed Care – PPO | Admitting: Family Medicine

## 2021-03-27 ENCOUNTER — Ambulatory Visit (INDEPENDENT_AMBULATORY_CARE_PROVIDER_SITE_OTHER): Payer: BC Managed Care – PPO

## 2021-03-27 ENCOUNTER — Other Ambulatory Visit: Payer: Self-pay

## 2021-03-27 ENCOUNTER — Encounter: Payer: Self-pay | Admitting: Family Medicine

## 2021-03-27 VITALS — BP 110/64 | HR 88 | Ht 59.0 in | Wt 115.0 lb

## 2021-03-27 DIAGNOSIS — M5136 Other intervertebral disc degeneration, lumbar region: Secondary | ICD-10-CM

## 2021-03-27 DIAGNOSIS — G8929 Other chronic pain: Secondary | ICD-10-CM | POA: Diagnosis not present

## 2021-03-27 DIAGNOSIS — M25561 Pain in right knee: Secondary | ICD-10-CM | POA: Diagnosis not present

## 2021-03-27 DIAGNOSIS — M545 Low back pain, unspecified: Secondary | ICD-10-CM

## 2021-03-27 DIAGNOSIS — M2550 Pain in unspecified joint: Secondary | ICD-10-CM | POA: Diagnosis not present

## 2021-03-27 DIAGNOSIS — R5383 Other fatigue: Secondary | ICD-10-CM | POA: Diagnosis not present

## 2021-03-27 DIAGNOSIS — M51369 Other intervertebral disc degeneration, lumbar region without mention of lumbar back pain or lower extremity pain: Secondary | ICD-10-CM | POA: Insufficient documentation

## 2021-03-27 LAB — COMPREHENSIVE METABOLIC PANEL
ALT: 11 U/L (ref 0–35)
AST: 18 U/L (ref 0–37)
Albumin: 4.3 g/dL (ref 3.5–5.2)
Alkaline Phosphatase: 68 U/L (ref 39–117)
BUN: 26 mg/dL — ABNORMAL HIGH (ref 6–23)
CO2: 30 mEq/L (ref 19–32)
Calcium: 11.2 mg/dL — ABNORMAL HIGH (ref 8.4–10.5)
Chloride: 103 mEq/L (ref 96–112)
Creatinine, Ser: 0.93 mg/dL (ref 0.40–1.20)
GFR: 64.81 mL/min (ref >60.00)
Glucose, Bld: 95 mg/dL (ref 70–99)
Potassium: 4.2 mEq/L (ref 3.5–5.1)
Sodium: 140 mEq/L (ref 135–145)
Total Bilirubin: 0.3 mg/dL (ref 0.2–1.2)
Total Protein: 7.1 g/dL (ref 6.0–8.3)

## 2021-03-27 LAB — CBC WITH DIFFERENTIAL/PLATELET
Basophils Absolute: 0 10*3/uL (ref 0.0–0.1)
Basophils Relative: 0.8 % (ref 0.0–3.0)
Eosinophils Absolute: 0.1 10*3/uL (ref 0.0–0.7)
Eosinophils Relative: 1 % (ref 0.0–5.0)
HCT: 37.8 % (ref 36.0–46.0)
Hemoglobin: 12.9 g/dL (ref 12.0–15.0)
Lymphocytes Relative: 19 % (ref 12.0–46.0)
Lymphs Abs: 1.1 10*3/uL (ref 0.7–4.0)
MCHC: 34.1 g/dL (ref 30.0–36.0)
MCV: 101.3 fl — ABNORMAL HIGH (ref 78.0–100.0)
Monocytes Absolute: 0.4 10*3/uL (ref 0.1–1.0)
Monocytes Relative: 7.6 % (ref 3.0–12.0)
Neutro Abs: 4.2 10*3/uL (ref 1.4–7.7)
Neutrophils Relative %: 71.6 % (ref 43.0–77.0)
Platelets: 336 10*3/uL (ref 150.0–400.0)
RBC: 3.74 Mil/uL — ABNORMAL LOW (ref 3.87–5.11)
RDW: 14.7 % (ref 11.5–15.5)
WBC: 5.9 10*3/uL (ref 4.0–10.5)

## 2021-03-27 LAB — MAGNESIUM: Magnesium: 2.2 mg/dL (ref 1.5–2.5)

## 2021-03-27 LAB — FERRITIN: Ferritin: 68.7 ng/mL (ref 10.0–291.0)

## 2021-03-27 LAB — IBC PANEL
Iron: 84 ug/dL (ref 42–145)
Saturation Ratios: 16.5 % — ABNORMAL LOW (ref 20.0–50.0)
Transferrin: 363 mg/dL — ABNORMAL HIGH (ref 212.0–360.0)

## 2021-03-27 LAB — TSH: TSH: 0.18 u[IU]/mL — ABNORMAL LOW (ref 0.35–4.50)

## 2021-03-27 LAB — SEDIMENTATION RATE: Sed Rate: 13 mm/hr (ref 0–30)

## 2021-03-27 NOTE — Assessment & Plan Note (Signed)
Severe degenerative disc disease of the lumbar spine.  Patient actually on x-ray today questionable compression fracture at the L1 or L2 level.  Overread is pending.  This is difficult to read secondary to patient's severe back scoliosis and degenerative changes.  Patient does have some mild weakness of the right lower extremity that could be contributing as well.  Gust with patient about icing regimen, will start with formal physical therapy.  We will get laboratory work-up in case this is a compression fracture for further evaluation and patient is due for a bone density in August.  We will consider about getting this earlier.  Follow-up again in 5 weeks otherwise.

## 2021-03-27 NOTE — Patient Instructions (Addendum)
Good to see you  X-rays done today  Low back exercises given  Vit D 4,000 IU daily  PT [place] low back compression fracture  See me again in

## 2021-03-31 LAB — CALCIUM, IONIZED: Calcium, Ion: 6.06 mg/dL — ABNORMAL HIGH (ref 4.8–5.6)

## 2021-03-31 LAB — PTH, INTACT AND CALCIUM
Calcium: 11.3 mg/dL — ABNORMAL HIGH (ref 8.6–10.4)
PTH: 20 pg/mL (ref 16–77)

## 2021-03-31 LAB — VITAMIN D 1,25 DIHYDROXY
Vitamin D 1, 25 (OH)2 Total: 73 pg/mL — ABNORMAL HIGH (ref 18–72)
Vitamin D2 1, 25 (OH)2: <8 pg/mL
Vitamin D3 1, 25 (OH)2: 73 pg/mL

## 2021-04-01 ENCOUNTER — Encounter: Payer: Self-pay | Admitting: Family Medicine

## 2021-04-16 ENCOUNTER — Other Ambulatory Visit: Payer: Self-pay

## 2021-04-16 ENCOUNTER — Encounter: Payer: Self-pay | Admitting: Physical Therapy

## 2021-04-16 ENCOUNTER — Ambulatory Visit: Payer: BC Managed Care – PPO | Attending: Family Medicine | Admitting: Physical Therapy

## 2021-04-16 DIAGNOSIS — G8929 Other chronic pain: Secondary | ICD-10-CM

## 2021-04-16 DIAGNOSIS — M25551 Pain in right hip: Secondary | ICD-10-CM | POA: Insufficient documentation

## 2021-04-16 DIAGNOSIS — L821 Other seborrheic keratosis: Secondary | ICD-10-CM | POA: Diagnosis not present

## 2021-04-16 DIAGNOSIS — D225 Melanocytic nevi of trunk: Secondary | ICD-10-CM | POA: Diagnosis not present

## 2021-04-16 DIAGNOSIS — Z85828 Personal history of other malignant neoplasm of skin: Secondary | ICD-10-CM | POA: Diagnosis not present

## 2021-04-16 DIAGNOSIS — M545 Low back pain, unspecified: Secondary | ICD-10-CM | POA: Insufficient documentation

## 2021-04-16 DIAGNOSIS — M6281 Muscle weakness (generalized): Secondary | ICD-10-CM

## 2021-04-16 DIAGNOSIS — L570 Actinic keratosis: Secondary | ICD-10-CM | POA: Diagnosis not present

## 2021-04-16 DIAGNOSIS — R293 Abnormal posture: Secondary | ICD-10-CM

## 2021-04-16 DIAGNOSIS — L814 Other melanin hyperpigmentation: Secondary | ICD-10-CM | POA: Diagnosis not present

## 2021-04-16 NOTE — Patient Instructions (Addendum)
RE-ALIGNMENT ROUTINE EXERCISES-OSTEOPROROSIS BASIC FOR POSTURAL CORRECTION   RE-ALIGNMENT Tips BENEFITS: 1.It helps to re-align the curves of the back and improve standing posture. 2.It allows the back muscles to rest and strengthen in preparation for more activity. FREQUENCY: Daily, even after weeks, months and years of more advanced exercises. START: 1.All exercises start in the same position: lying on the back, arms resting on the supporting surface, palms up and slightly away from the body, backs of hands down, knees bent, feet flat. 2.The head, neck, arms, and legs are supported according to specific instructions of your therapist. Copyright  VHI. All rights reserved.    1. Decompression Exercise: Basic.   Takes compression off the vertebral bodies; increases tolerance for lying on the back; helps relieve back pain   Lie on back on firm surface, knees bent, feet flat, arms turned up, out to sides (~35 degrees). Head neck and arms supported as necessary. Time _5-15__ minutes. Surface: floor     2. Shoulder Press  Strengthens upper back extensors and scapular retractors.   Press both shoulders down. Hold _2-3__ seconds. Repeat _3-5__ times. Surface: floor        3. Head Press With Orient  Strengthens neck extensors   Tuck chin SLIGHTLY toward chest, keep mouth closed. Feel weight on back of head. Increase weight by pressing head down. Hold _2-3__ seconds. Relax. Repeat 3-5___ times. Surface: floor     4. Leg Lengthener: stretches quadratus lumborum and hip flexors.  Strengthens quads and ankle dorsiflexors.  Leg Lengthener: Full    Straighten one leg. Pull toes AND forefoot toward knee, extend heel. Lengthen leg by pulling pelvis away from ribs. Hold __5_ seconds. Relax. Repeat 1 time. Re-bend knee. Do other leg. Each leg __5_ times. Surface: floor    Leg Lengthener / Leg Press Combo: Single Leg    Straighten one leg down to floor. Pull toes AND forefoot  toward knee; extend heel. Lengthen leg by pulling pelvis away from ribs. Press leg down. DO NOT BEND KNEE. Hold _5__ seconds. Relax leg. Repeat exercise 1 time. Relax leg. Re-bend knee. Repeat with other leg. Do 5 times  Access Code: [email or web address] URL: [email or web address] Date: 04/16/2021 Prepared by: Venetia Night Linetta Regner  Exercises Sidelying Quadriceps Stretch - 1 x daily - 7 x weekly - 1 sets - 2 reps - 30 hold Sidelying Hip Abduction - 1 x daily - 7 x weekly - 1 sets - 10 reps

## 2021-04-16 NOTE — Therapy (Signed)
[facility] [address], [place], [place], [address] Phone: [phone]   Fax:  [phone]  Physical Therapy Evaluation  Patient Details  Name: Tammy Kennedy MRN: [id number] Date of Birth: 12-18-1955 Referring Provider (PT): Lyndal Pulley, DO   Encounter Date: 04/16/2021   PT End of Session - 04/16/21 0929     Visit Number 1    Date for PT Re-Evaluation 06/11/21    Authorization Type BCBS    PT Start Time 0930    PT Stop Time 4650    PT Time Calculation (min) 45 min    Activity Tolerance Patient tolerated treatment well    Behavior During Therapy [facility] for tasks assessed/performed             Past Medical History:  Diagnosis Date   Alcohol abuse 05/14/2016   ALCOHOL ABUSE, HX OF    ANEMIA    ANXIETY DISORDER    Basal cell carcinoma (BCC) 01/10/2018   C. difficile colitis    CROHN'S DISEASE    DEPRESSION    DRY EYE SYNDROME    HEPATOMEGALY, HX OF    HYPERLIPIDEMIA    intol of statin trials   HYPOTHYROIDISM    OSTEOPENIA    Osteoporosis 05/2017   states Pre osteoporosis 8/18   Rectovaginal fistula 2012   chron's disease   Tubular adenoma 2013    Past Surgical History:  Procedure Laterality Date   ANAL FISSURE REPAIR     CATARACT EXTRACTION     EYE SURGERY  11/2017   macular pucker   HEMORRHOID SURGERY     Left thumb surgery  1989   MOHS SURGERY  2008   Nose [facility]   Theraputic abortion      There were no vitals filed for this visit.    Subjective Assessment - 04/16/21 0928     Subjective Pt reports with LBP since early May when she was stretching as she got out of bed and heard a pop.  Acute pain and spasms followed.  Pain is improved since intial onset but still ongoing.  Pain is right sided along bra line.  Pain is worse in AM, being still x 20', with lifting or standing tasks in kitchen.  Movement helps but can't overdo or worsens.  Pt also has polyarthralgia.  She has ongoing Rt  anterior hip, thigh and knee pain.  Pain is worse with sitting and stairs.  Currently Rt LE pain is > LBP.    Pertinent History osteopenia - related to chronic steroid use for Chron's disease - has mutliple anterior wedge vertebrae: T12, L1, L3, L5    Limitations Sitting;Standing;House hold activities;Lifting    How long can you sit comfortably? 20-30 min, Rt LE    How long can you stand comfortably? 20-30 min (for back)    How long can you walk comfortably? hit or miss for Rt hip pain - is afraid to do too much walking    Diagnostic tests 03/2021 lumbar xray: severe lumbar degeneration, anterior wedging T12, L1, L3, L5, scoliosis, 03/2021 Rt knee xray negative    Patient Stated Goals pain relief, be able walk around the block or more for exercise (has housemate with cancer who needs to walk too)    Currently in Pain? Yes    Pain Score 0-No pain   can reach a 4/10   Pain Location Back    Pain Orientation Right;Mid    Pain Descriptors / Indicators Sharp;Tiring  Pain Type Chronic pain;Acute pain    Pain Onset More than a month ago    Pain Frequency Intermittent    Aggravating Factors  worse in AM, being still x 20', with lifting or standing tasks in kitchen.    Effect of Pain on Daily Activities standing and sitting activities, walking    Multiple Pain Sites Yes    Pain Score 3   can reach 7/10   Pain Location Hip    Pain Orientation Right;Anterior    Pain Descriptors / Indicators Tightness;Aching;Squeezing    Pain Type Acute pain;Chronic pain    Pain Radiating Towards along quad muscle into anterior knee    Pain Onset More than a month ago    Pain Frequency Intermittent    Aggravating Factors  sitting, stairs                [facility] PT Assessment - 04/16/21 0001       Assessment   Medical Diagnosis M54.50 (ICD-10-CM) - Acute right-sided low back pain without sciatica  M25.50 (ICD-10-CM) - Polyarthralgia    Referring Provider (PT) Lyndal Pulley, DO    Onset Date/Surgical Date  --   1 year for Rt leg, 2 months for LBP   Hand Dominance Right    Next MD Visit 2.5 weeks      Precautions   Precautions Back    Precaution Comments osteopenia - has anterior wedge fractures      Balance Screen   Has the patient fallen in the past 6 months Yes    How many times? 3    Has the patient had a decrease in activity level because of a fear of falling?  Yes    Is the patient reluctant to leave their home because of a fear of falling?  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    Leisure crafts      Observation/Other Assessments   Focus on Therapeutic Outcomes (FOTO)  52% goal 62%      Functional Tests   Functional tests Sit to Stand;Single leg stance      Single Leg Stance   Comments insecure on Rt LE but able to stand x 3 sec, able to balance 5 sec Lt LE      Sit to Stand   Comments ind without UEs      Posture/Postural Control   Posture/Postural Control Postural limitations    Posture Comments scoliosis Lt SB, Rt Rot, signif rib hump on Rt, depressed Rt shoulder girdle      ROM / Strength   AROM / PROM / Strength AROM;PROM;Strength      AROM   Overall AROM  Within functional limits for tasks performed    Overall AROM Comments trunk ROM WFL, pain with Rt SB and Lt SB on Rt mid-back      PROM   Overall PROM Comments Rt hip ER limited and painful, guarded      Strength   Overall Strength Comments hips 4/5, knees 4-/5  Flexibility   Soft Tissue Assessment /Muscle Length yes   limited adductors bil 20%   Hamstrings limited bil 20%    Quadriceps limited bil 20%     ITB limited Rt 20%      Palpation   Spinal mobility limited thoracic mobility Rt ribcage related to scoliosis    Palpation comment Rt ITB, Rt spinous process T12, L1, Rt lower thoracic paraspinals, intercostals Rt T10-T12      Special Tests    Special Tests Hip Special Tests    Hip Special Tests  Saralyn Pilar (FABER) Test      Saralyn Pilar [facility]) Test   Findings Positive    Side Right      Transfers   Transfers Independent with all Transfers                        Objective measurements completed on examination: See above findings.       [place] Adult PT Treatment/Exercise - 04/16/21 0001       Self-Care   Self-Care Other Self-Care Comments    Other Self-Care Comments  spinal decompression, goals for PT verbally discussed                    PT Education - 04/16/21 1013     Education Details Access Code: [email or web address], + spinal decompression series without leg lengthener due to pain with this    Person(s) Educated Patient    Methods Explanation;Demonstration;Handout    Comprehension Verbalized understanding;Returned demonstration              PT Short Term Goals - 04/16/21 1258       PT SHORT TERM GOAL #1   Title ind with initial HEP    Time 4    Period Weeks    Status New    Target Date 05/14/21      PT SHORT TERM GOAL #2   Title Pt will be able to demo improved SLS balance on each LE x 10 sec    Time 4    Period Weeks    Status New    Target Date 05/14/21      PT SHORT TERM GOAL #3   Title Pt will report tolerance of 20-30 min of light standing housework without exacerbation of pain    Time 4    Period Weeks    Status New    Target Date 05/14/21      PT SHORT TERM GOAL #4   Title Pt will be able to sit x 30 min without exacerbation of pain    Time 4    Period Weeks    Status New    Target Date 05/14/21               PT Long Term Goals - 04/16/21 1300       PT LONG TERM GOAL #1   Title Pt will be ind with advanced HEP  and learn dos and don'ts for bone health given ostepenia/compression fracture history.    Time 8    Period Weeks    Status New    Target Date 06/11/21      PT LONG TERM GOAL #2   Title FOTO score improved by at least 10 points to 62%    Baseline 52%    Time 8    Period Weeks    Status New    Target Date 06/11/21      PT LONG TERM GOAL #  3   Title Improved LE strength of at least 4+/5 with functional ability to perform stairs at work/home and SLS x 10 sec with good knee and hip stability.    Time 8    Period Weeks    Status New    Target Date 06/11/21      PT LONG TERM GOAL #4   Title Reduced pain by at least 50% for back and Rt thigh with all daily tasks.    Time 8    Period Weeks    Status New    Target Date 06/11/21      PT LONG TERM GOAL #5   Title Able to walk for exercise x at least 2 laps around block at home without exacerbation of pain    Time 8    Period Weeks    Status New    Target Date 06/11/21                    Plan - 04/16/21 1242     Clinical Impression Statement Pt is a pleasant 65yo female with Rt sided back pain and Rt thigh pain from hip to knee.  Thigh pain is worse than back pain and has been present x 1 year.  Thigh is described as a tight twisted band and is worse with sitting and stairs.  Back pain is Rt sided and located at T/L junction level.  Pt has scoliosis and osteopenia secondary to long-term steroid use for Chron's disease.  Xrays show anterior wedge compression fractures at T12, L1, L3, and L5.  Pain has been present x 2 months and started when she was flexed and stretching as she got out of bed and felt something "pop."  Xrays did find new compression fractures at T12 and L1 (last xray was done in 2007).  Back pain is worse in AM on waking, with lfiting and standing tasks x 20', or with being still too long (20').  Pt has also had 3 falls over past 6 months and has fear of falling.  She presents with postural dysfunction, LE weakness  (hips 4/5, knees 4-/5), decreased balance in SLS on Rt, limited thoracic ROM, and LE flexibility restrictions of quads and hamstrings.  She has guarded reaction to Rt hip ER P/ROM in supine with + FABER.  Tenderness is present along Rt ITB, T12 and L1 spinous processes, and lower thoracic paraspinals on Rt.  Pt will benefit from skilled PT to address pain and deficits to improve QOL and tolerance of daily tasks.    Personal Factors and Comorbidities Comorbidity 1;Comorbidity 2    Comorbidities osteopenia with multiple anterior wedge compression fractures, scoliosis    Examination-Activity Limitations Lift;Stand;Stairs;Sit;Bend;Locomotion Level    Examination-Participation Restrictions Meal Prep;Cleaning;Community Activity;Driving;Laundry;Yard Work    Merchant navy officer Evolving/Moderate complexity    Clinical Decision Making Moderate    Rehab Potential Good    PT Frequency 2x / week    PT Duration 8 weeks    PT Treatment/Interventions ADLs/Self Care Home Management;Electrical Stimulation;Moist Heat;Cryotherapy;Neuromuscular re-education;Balance training;Therapeutic exercise;Therapeutic activities;Functional mobility training;Patient/family education;Manual techniques;Passive range of motion;Dry needling;Taping    PT Next Visit Plan review HEP, add hamstring stretching, start standing resisted ther ex for UEs/trunk stab, LE functional strength, give osteo dos and don'ts    PT Home Exercise Plan Access Code: [email or web address] + spinal decompression (without leg lengthener due to pain with this)    Recommended Other Services osteopenia with multiple compression fractures  Patient will benefit from skilled therapeutic intervention in order to improve the following deficits and impairments:  Decreased range of motion, Increased muscle spasms, Postural dysfunction, Decreased strength, Decreased mobility, Impaired flexibility, Pain, Decreased balance, Decreased activity  tolerance  Visit Diagnosis: Chronic right-sided low back pain without sciatica - Plan: PT plan of care cert/re-cert  Pain in right hip - Plan: PT plan of care cert/re-cert  Abnormal posture - Plan: PT plan of care cert/re-cert  Muscle weakness (generalized) - Plan: PT plan of care cert/re-cert     Problem List Patient Active Problem List   Diagnosis Date Noted   Degenerative disc disease, lumbar 03/27/2021   Right knee pain 07/29/2020   Hyperglycemia 04/01/2020   Lateral epicondylitis of right elbow 11/11/2018   Basal cell carcinoma (BCC) 01/10/2018   Gastroesophageal reflux disease 11/12/2017   Idiopathic scoliosis 05/14/2016   Alcohol abuse 05/14/2016   Rectovaginal fistula    C. difficile colitis    Crohn's disease ([place]) 05/27/2009   HEPATOMEGALY, HX OF 08/31/2008   Hypothyroidism 07/30/2008   Hypertriglyceridemia 07/30/2008   Anxiety 07/30/2008   Depression 07/30/2008   RENAL CYST 07/30/2008   DEGENERATIVE DISC DISEASE 07/30/2008   Osteopenia 07/30/2008    Jozelyn Kuwahara, PT 04/16/21 1:05 PM   [facility] [address] [place], [place], [address] Phone: [phone]   Fax:  [phone]  Name: Tammy Kennedy MRN: [id number] Date of Birth: Aug 06, 1956

## 2021-04-18 ENCOUNTER — Other Ambulatory Visit: Payer: Self-pay

## 2021-04-18 ENCOUNTER — Ambulatory Visit: Payer: BC Managed Care – PPO | Admitting: Physical Therapy

## 2021-04-18 DIAGNOSIS — M25551 Pain in right hip: Secondary | ICD-10-CM

## 2021-04-18 DIAGNOSIS — M6281 Muscle weakness (generalized): Secondary | ICD-10-CM

## 2021-04-18 DIAGNOSIS — G8929 Other chronic pain: Secondary | ICD-10-CM

## 2021-04-18 DIAGNOSIS — R293 Abnormal posture: Secondary | ICD-10-CM

## 2021-04-18 DIAGNOSIS — M545 Low back pain, unspecified: Secondary | ICD-10-CM | POA: Diagnosis not present

## 2021-04-18 NOTE — Patient Instructions (Signed)
Seated Hamstring Stretch - 1 x daily - 7 x weekly - 1 sets - 2 reps - 20s hold Child's Pose Stretch - 1 x daily - 7 x weekly - 1 sets - 5 reps - 10s hold Standing Anti-Rotation Press with Anchored Resistance - 1 x daily - 7 x weekly - 1 sets - 10 reps

## 2021-04-18 NOTE — Therapy (Signed)
[facility] [address] [place], [place], [address] Phone: [phone]   Fax:  [phone]  Physical Therapy Treatment  Patient Details  Name: Tammy Kennedy MRN: [id number] Date of Birth: 1956-06-12 Referring Provider (PT): Lyndal Pulley, DO   Encounter Date: 04/18/2021   PT End of Session - 04/18/21 1014     Visit Number 2    Date for PT Re-Evaluation 06/11/21    Authorization Type BCBS    PT Start Time [phone]   increased time at check out   PT Stop Time 0930    PT Time Calculation (min) 40 min    Activity Tolerance Patient tolerated treatment well    Behavior During Therapy [facility] for tasks assessed/performed             Past Medical History:  Diagnosis Date   Alcohol abuse 05/14/2016   ALCOHOL ABUSE, HX OF    ANEMIA    ANXIETY DISORDER    Basal cell carcinoma (BCC) 01/10/2018   C. difficile colitis    CROHN'S DISEASE    DEPRESSION    DRY EYE SYNDROME    HEPATOMEGALY, HX OF    HYPERLIPIDEMIA    intol of statin trials   HYPOTHYROIDISM    OSTEOPENIA    Osteoporosis 05/2017   states Pre osteoporosis 8/18   Rectovaginal fistula 2012   chron's disease   Tubular adenoma 2013    Past Surgical History:  Procedure Laterality Date   ANAL FISSURE REPAIR     CATARACT EXTRACTION     EYE SURGERY  11/2017   macular pucker   HEMORRHOID SURGERY     Left thumb surgery  1989   MOHS SURGERY  2008   Nose [facility]   Theraputic abortion      There were no vitals filed for this visit.   Subjective Assessment - 04/18/21 0852     Subjective Has soreness today. Attributes this to first session and having a lot to do yesterday. Has not yet attempted HEP. Is trying to get more organized so that she has time to complete it.    Pertinent History osteopenia - related to chronic steroid use for Chron's disease - has mutliple anterior wedge vertebrae: T12, L1, L3, L5    Limitations Sitting;Standing;House hold  activities;Lifting    How long can you sit comfortably? 20-30 min, Rt LE    How long can you stand comfortably? 20-30 min (for back)    How long can you walk comfortably? hit or miss for Rt hip pain - is afraid to do too much walking    Diagnostic tests 03/2021 lumbar xray: severe lumbar degeneration, anterior wedging T12, L1, L3, L5, scoliosis, 03/2021 Rt knee xray negative    Patient Stated Goals pain relief, be able walk around the block or more for exercise (has housemate with cancer who needs to walk too)    Currently in Pain? Yes    Pain Score 3     Pain Location Back    Pain Orientation Lower;Mid    Pain Descriptors / Indicators Sore    Pain Type Chronic pain                               [facility] Adult PT Treatment/Exercise - 04/18/21 0001       Exercises   Exercises Lumbar      Lumbar Exercises: Stretches   Active Hamstring Stretch Right;Left;1  rep;20 seconds    Active Hamstring Stretch Limitations seated    Hip Flexor Stretch Right;Left;1 rep;20 seconds    Hip Flexor Stretch Limitations 1/2 kneel    Other Lumbar Stretch Exercise seated lumbar flexion, 3 x 10s      Lumbar Exercises: Standing   Other Standing Lumbar Exercises anti rotation press, red tband, x10 Lt/Rt    Other Standing Lumbar Exercises resisted side stepping, green loop, 2 x 20 feet      Lumbar Exercises: Seated   Sit to Stand 10 reps    Sit to Stand Limitations without UE      Lumbar Exercises: Supine   Other Supine Lumbar Exercises supine on foam roll: costal expansion breathing x10; cane chest press x10; horizontal abduction at 90/90 x10      Lumbar Exercises: Quadruped   Madcat/Old Horse 10 reps    Other Quadruped Lumbar Exercises child's pose, 5 x 10s hold                    PT Education - 04/18/21 0929     Education Details seated hamstring stretch, child's pose, anti rotation press (unable to print due to medbridge dysfunction. will provide to patient next session)     Person(s) Educated Patient    Methods Explanation;Demonstration;Tactile cues;Verbal cues;Handout    Comprehension Verbalized understanding;Returned demonstration;Verbal cues required;Tactile cues required              PT Short Term Goals - 04/16/21 1258       PT SHORT TERM GOAL #1   Title ind with initial HEP    Time 4    Period Weeks    Status New    Target Date 05/14/21      PT SHORT TERM GOAL #2   Title Pt will be able to demo improved SLS balance on each LE x 10 sec    Time 4    Period Weeks    Status New    Target Date 05/14/21      PT SHORT TERM GOAL #3   Title Pt will report tolerance of 20-30 min of light standing housework without exacerbation of pain    Time 4    Period Weeks    Status New    Target Date 05/14/21      PT SHORT TERM GOAL #4   Title Pt will be able to sit x 30 min without exacerbation of pain    Time 4    Period Weeks    Status New    Target Date 05/14/21               PT Long Term Goals - 04/16/21 1300       PT LONG TERM GOAL #1   Title Pt will be ind with advanced HEP and learn dos and don'ts for bone health given ostepenia/compression fracture history.    Time 8    Period Weeks    Status New    Target Date 06/11/21      PT LONG TERM GOAL #2   Title FOTO score improved by at least 10 points to 62%    Baseline 52%    Time 8    Period Weeks    Status New    Target Date 06/11/21      PT LONG TERM GOAL #3   Title Improved LE strength of at least 4+/5 with functional ability to perform stairs at work/home and SLS x 10 sec with good knee  and hip stability.    Time 8    Period Weeks    Status New    Target Date 06/11/21      PT LONG TERM GOAL #4   Title Reduced pain by at least 50% for back and Rt thigh with all daily tasks.    Time 8    Period Weeks    Status New    Target Date 06/11/21      PT LONG TERM GOAL #5   Title Able to walk for exercise x at least 2 laps around block at home without exacerbation of pain     Time 8    Period Weeks    Status New    Target Date 06/11/21                   Plan - 04/18/21 0929     Clinical Impression Statement Patient reports decreased pain and improved quality of movement at end of session. Verbal cues provided for making full foot contact when performing resisted side stepping activity. She required heavy multimodal cuing for proper form when performing cat/cow. She would benefit from continued skilled intervention for decreaesed pain and improved activity tolerance.    Personal Factors and Comorbidities Comorbidity 1;Comorbidity 2    Comorbidities osteopenia with multiple anterior wedge compression fractures, scoliosis    Examination-Activity Limitations Lift;Stand;Stairs;Sit;Bend;Locomotion Level    Examination-Participation Restrictions Meal Prep;Cleaning;Community Activity;Driving;Laundry;Yard Work    Rehab Potential Good    PT Frequency 2x / week    PT Duration 8 weeks    PT Treatment/Interventions ADLs/Self Care Home Management;Electrical Stimulation;Moist Heat;Cryotherapy;Neuromuscular re-education;Balance training;Therapeutic exercise;Therapeutic activities;Functional mobility training;Patient/family education;Manual techniques;Passive range of motion;Dry needling;Taping    PT Next Visit Plan print new HEP exercises added this session, continue global hip/spine mobility, continue trunk stabilization and LE strengthening    PT Home Exercise Plan Access Code: [email or web address] + spinal decompression (without leg lengthener due to pain with this)    Consulted and Agree with Plan of Care Patient             Patient will benefit from skilled therapeutic intervention in order to improve the following deficits and impairments:  Decreased range of motion, Increased muscle spasms, Postural dysfunction, Decreased strength, Decreased mobility, Impaired flexibility, Pain, Decreased balance, Decreased activity tolerance  Visit Diagnosis: Pain in right  hip  Chronic right-sided low back pain without sciatica  Abnormal posture  Muscle weakness (generalized)     Problem List Patient Active Problem List   Diagnosis Date Noted   Degenerative disc disease, lumbar 03/27/2021   Right knee pain 07/29/2020   Hyperglycemia 04/01/2020   Lateral epicondylitis of right elbow 11/11/2018   Basal cell carcinoma (BCC) 01/10/2018   Gastroesophageal reflux disease 11/12/2017   Idiopathic scoliosis 05/14/2016   Alcohol abuse 05/14/2016   Rectovaginal fistula    C. difficile colitis    Crohn's disease ([place]) 05/27/2009   HEPATOMEGALY, HX OF 08/31/2008   Hypothyroidism 07/30/2008   Hypertriglyceridemia 07/30/2008   Anxiety 07/30/2008   Depression 07/30/2008   RENAL CYST 07/30/2008   DEGENERATIVE DISC DISEASE 07/30/2008   Osteopenia 07/30/2008   Everardo All PT, DPT  04/18/21 10:16 AM   [facility] [address], [place], [place], [address] Phone: [phone]   Fax:  [phone]  Name: Tammy Kennedy MRN: [id number] Date of Birth: 06-Dec-1955

## 2021-04-22 ENCOUNTER — Other Ambulatory Visit: Payer: Self-pay | Admitting: Internal Medicine

## 2021-04-23 ENCOUNTER — Encounter: Payer: Self-pay | Admitting: Physical Therapy

## 2021-04-23 ENCOUNTER — Ambulatory Visit: Payer: BC Managed Care – PPO | Admitting: Physical Therapy

## 2021-04-23 ENCOUNTER — Other Ambulatory Visit: Payer: Self-pay

## 2021-04-23 DIAGNOSIS — R293 Abnormal posture: Secondary | ICD-10-CM | POA: Diagnosis not present

## 2021-04-23 DIAGNOSIS — M6281 Muscle weakness (generalized): Secondary | ICD-10-CM

## 2021-04-23 DIAGNOSIS — G8929 Other chronic pain: Secondary | ICD-10-CM

## 2021-04-23 DIAGNOSIS — M25551 Pain in right hip: Secondary | ICD-10-CM | POA: Diagnosis not present

## 2021-04-23 DIAGNOSIS — M545 Low back pain, unspecified: Secondary | ICD-10-CM | POA: Diagnosis not present

## 2021-04-23 NOTE — Patient Instructions (Signed)
Access Code: [email or web address] URL: [email or web address] Date: 04/23/2021 Prepared by: Venetia Night Atthew Coutant  Exercises Sidelying Quadriceps Stretch - 1 x daily - 7 x weekly - 1 sets - 2 reps - 30 hold Sidelying Hip Abduction - 1 x daily - 7 x weekly - 1 sets - 10 reps Seated Hamstring Stretch - 1 x daily - 7 x weekly - 1 sets - 2 reps - 20s hold Child's Pose Stretch - 1 x daily - 7 x weekly - 1 sets - 5 reps - 10s hold Standing Anti-Rotation Press with Anchored Resistance - 1 x daily - 7 x weekly - 1 sets - 10 reps Forward Step Up - 1 x daily - 7 x weekly - 2 sets - 10 reps Lateral Step Up - 1 x daily - 7 x weekly - 2 sets - 10 reps Beginner Bridge - 1 x daily - 7 x weekly - 2 sets - 10 reps

## 2021-04-23 NOTE — Therapy (Signed)
[facility] [address] [place], [place], [address] Phone: [phone]   Fax:  [phone]  Physical Therapy Treatment  Patient Details  Name: Tammy Kennedy MRN: [id number] Date of Birth: 1956/06/06 Referring Provider (PT): Lyndal Pulley, DO   Encounter Date: 04/23/2021   PT End of Session - 04/23/21 1012     Visit Number 3    Date for PT Re-Evaluation 06/11/21    Authorization Type BCBS    PT Start Time 0930    PT Stop Time 1013    PT Time Calculation (min) 43 min    Activity Tolerance Patient tolerated treatment well    Behavior During Therapy [facility] for tasks assessed/performed             Past Medical History:  Diagnosis Date   Alcohol abuse 05/14/2016   ALCOHOL ABUSE, HX OF    ANEMIA    ANXIETY DISORDER    Basal cell carcinoma (BCC) 01/10/2018   C. difficile colitis    CROHN'S DISEASE    DEPRESSION    DRY EYE SYNDROME    HEPATOMEGALY, HX OF    HYPERLIPIDEMIA    intol of statin trials   HYPOTHYROIDISM    OSTEOPENIA    Osteoporosis 05/2017   states Pre osteoporosis 8/18   Rectovaginal fistula 2012   chron's disease   Tubular adenoma 2013    Past Surgical History:  Procedure Laterality Date   ANAL FISSURE REPAIR     CATARACT EXTRACTION     EYE SURGERY  11/2017   macular pucker   HEMORRHOID SURGERY     Left thumb surgery  1989   MOHS SURGERY  2008   Nose [facility]   Theraputic abortion      There were no vitals filed for this visit.   Subjective Assessment - 04/23/21 0932     Subjective I continue to have Rt thigh pain > back pain.  Thigh is 5/10, back is 3/10.  I have not been able to do HEP due to being out of town and housemate went into hospital    Pertinent History osteopenia - related to chronic steroid use for Chron's disease - has mutliple anterior wedge vertebrae: T12, L1, L3, L5    Limitations Sitting;Standing;House hold activities;Lifting    How long can you sit comfortably?  20-30 min, Rt LE    How long can you stand comfortably? 20-30 min (for back)    How long can you walk comfortably? hit or miss for Rt hip pain - is afraid to do too much walking    Diagnostic tests 03/2021 lumbar xray: severe lumbar degeneration, anterior wedging T12, L1, L3, L5, scoliosis, 03/2021 Rt knee xray negative    Patient Stated Goals pain relief, be able walk around the block or more for exercise (has housemate with cancer who needs to walk too)    Currently in Pain? Yes    Pain Score 3     Pain Location Back    Multiple Pain Sites Yes    Pain Score 5    Pain Location Hip    Pain Orientation Right;Anterior    Pain Radiating Towards along quad muscle                               [facility] Adult PT Treatment/Exercise - 04/23/21 0001       Exercises   Exercises Lumbar;Knee/Hip;Shoulder  Lumbar Exercises: Stretches   Active Hamstring Stretch Left;Right;1 rep;30 seconds    Active Hamstring Stretch Limitations seated, VC to keep back straight    Hip Flexor Stretch Right;Left;1 rep;20 seconds    Hip Flexor Stretch Limitations 1/2 kneel      Lumbar Exercises: Standing   Shoulder Extension Strengthening;Both;10 reps;Theraband    Theraband Level (Shoulder Extension) Level 2 (Red)    Shoulder Extension Limitations from flexion to arms at sides with postural alignment: pelvis under ribcage, core activation focus    Other Standing Lumbar Exercises anti rotation press, red tband, x10 Lt/Rt      Lumbar Exercises: Supine   Other Supine Lumbar Exercises supine on foam roll: costal expansion breathing x10; cane chest press to overhead Y; horizontal abduction at 90/90 with red band x10      Knee/Hip Exercises: Standing   Lateral Step Up 1 set;10 reps;Step Height: 6";Hand Hold: 1    Forward Step Up 1 set;10 reps;Step Height: 6";Hand Hold: 0;Right      Knee/Hip Exercises: Seated   Sit to Sand 10 reps;without UE support      Knee/Hip Exercises: Supine   Bridges  Strengthening;Both;1 set;10 reps    Bridges with Clamshell Strengthening;Both;1 set;10 reps    Straight Leg Raises Right;Left;1 set;5 sets    Straight Leg Raises Limitations VC for level pelvis maintenance      Shoulder Exercises: Standing   Flexion Strengthening;Both;10 reps;Theraband    Theraband Level (Shoulder Flexion) Level 2 (Red)    Flexion Limitations from ext to 30 deg flexion with postural alignment and core activation, VC for pelvis under ribcage      Manual Therapy   Manual Therapy Soft tissue mobilization    Soft tissue mobilization Addaday assisted STM Rt quad and ITB, supine                    PT Education - 04/23/21 1012     Education Details updated step ups and bridge    Person(s) Educated Patient    Methods Explanation;Handout;Demonstration;Verbal cues    Comprehension Verbalized understanding;Returned demonstration              PT Short Term Goals - 04/16/21 1258       PT SHORT TERM GOAL #1   Title ind with initial HEP    Time 4    Period Weeks    Status New    Target Date 05/14/21      PT SHORT TERM GOAL #2   Title Pt will be able to demo improved SLS balance on each LE x 10 sec    Time 4    Period Weeks    Status New    Target Date 05/14/21      PT SHORT TERM GOAL #3   Title Pt will report tolerance of 20-30 min of light standing housework without exacerbation of pain    Time 4    Period Weeks    Status New    Target Date 05/14/21      PT SHORT TERM GOAL #4   Title Pt will be able to sit x 30 min without exacerbation of pain    Time 4    Period Weeks    Status New    Target Date 05/14/21               PT Long Term Goals - 04/16/21 1300       PT LONG TERM GOAL #1   Title Pt will  be ind with advanced HEP and learn dos and don'ts for bone health given ostepenia/compression fracture history.    Time 8    Period Weeks    Status New    Target Date 06/11/21      PT LONG TERM GOAL #2   Title FOTO score improved by  at least 10 points to 62%    Baseline 52%    Time 8    Period Weeks    Status New    Target Date 06/11/21      PT LONG TERM GOAL #3   Title Improved LE strength of at least 4+/5 with functional ability to perform stairs at work/home and SLS x 10 sec with good knee and hip stability.    Time 8    Period Weeks    Status New    Target Date 06/11/21      PT LONG TERM GOAL #4   Title Reduced pain by at least 50% for back and Rt thigh with all daily tasks.    Time 8    Period Weeks    Status New    Target Date 06/11/21      PT LONG TERM GOAL #5   Title Able to walk for exercise x at least 2 laps around block at home without exacerbation of pain    Time 8    Period Weeks    Status New    Target Date 06/11/21                   Plan - 04/23/21 1020     Clinical Impression Statement Pt continues to have Rt thigh pain that extends to knee which is worse than LBP.  She has not been compliant with HEP due to outside factors.  PT reviewed HEP and progressed supine, seated, and standing lumbopelvic and hip strength which Pt tolerated well without exaceration of pain.  PT added step ups fwd/lat to HEP and supine bridge.  Addaday assisted STM to Rt quad and ITB used with some relief.  PT provided VCs and demo for pelvic alignment under ribcage in standing for tband ther ex.  Pt tends to stand in sway back with anterior pelvis.  Continue along POC.    Comorbidities osteopenia with multiple anterior wedge compression fractures, scoliosis    PT Frequency 2x / week    PT Duration 8 weeks    PT Treatment/Interventions ADLs/Self Care Home Management;Electrical Stimulation;Moist Heat;Cryotherapy;Neuromuscular re-education;Balance training;Therapeutic exercise;Therapeutic activities;Functional mobility training;Patient/family education;Manual techniques;Passive range of motion;Dry needling;Taping    PT Next Visit Plan Addaday to Rt quad and ITB, continue LE stretches, trunk and LE stab and  strength, cue postural alignment in sitting and standing    PT Home Exercise Plan Access Code: [email or web address] + spinal decompression (without leg lengthener due to pain with this)    Consulted and Agree with Plan of Care Patient             Patient will benefit from skilled therapeutic intervention in order to improve the following deficits and impairments:     Visit Diagnosis: Pain in right hip  Chronic right-sided low back pain without sciatica  Abnormal posture  Muscle weakness (generalized)     Problem List Patient Active Problem List   Diagnosis Date Noted   Degenerative disc disease, lumbar 03/27/2021   Right knee pain 07/29/2020   Hyperglycemia 04/01/2020   Lateral epicondylitis of right elbow 11/11/2018   Basal cell carcinoma (BCC) 01/10/2018   Gastroesophageal reflux  disease 11/12/2017   Idiopathic scoliosis 05/14/2016   Alcohol abuse 05/14/2016   Rectovaginal fistula    C. difficile colitis    Crohn's disease ([place]) 05/27/2009   HEPATOMEGALY, HX OF 08/31/2008   Hypothyroidism 07/30/2008   Hypertriglyceridemia 07/30/2008   Anxiety 07/30/2008   Depression 07/30/2008   RENAL CYST 07/30/2008   DEGENERATIVE [place] DISEASE 07/30/2008   Osteopenia 07/30/2008   Aren Pryde, PT 04/23/21 12:54 PM   [facility] [address] [place], [place], [address] Phone: [phone]   Fax:  [phone]  Name: Tammy Kennedy MRN: [id number] Date of Birth: 11-24-1955

## 2021-04-25 ENCOUNTER — Encounter: Payer: Self-pay | Admitting: Physical Therapy

## 2021-04-25 ENCOUNTER — Other Ambulatory Visit: Payer: Self-pay

## 2021-04-25 ENCOUNTER — Ambulatory Visit: Payer: BC Managed Care – PPO | Admitting: Physical Therapy

## 2021-04-25 DIAGNOSIS — R293 Abnormal posture: Secondary | ICD-10-CM

## 2021-04-25 DIAGNOSIS — G8929 Other chronic pain: Secondary | ICD-10-CM | POA: Diagnosis not present

## 2021-04-25 DIAGNOSIS — M25551 Pain in right hip: Secondary | ICD-10-CM

## 2021-04-25 DIAGNOSIS — M6281 Muscle weakness (generalized): Secondary | ICD-10-CM

## 2021-04-25 DIAGNOSIS — M545 Low back pain, unspecified: Secondary | ICD-10-CM | POA: Diagnosis not present

## 2021-04-25 NOTE — Progress Notes (Signed)
[facility] [place] Phone: [phone] Subjective:   Tammy Kennedy, am serving as a scribe for Dr. Hulan Saas.  I'm seeing this patient by the request  of:  Binnie Rail, MD  CC: Back pain and knee pain follow-up  HTD:[id number]  03/27/2021 Severe degenerative disc disease of the lumbar spine.  Patient actually on x-ray today questionable compression fracture at the L1 or L2 level.  Overread is pending.  This is difficult to read secondary to patient's severe back scoliosis and degenerative changes.  Patient does have some mild weakness of the right lower extremity that could be contributing as well.  Gust with patient about icing regimen, will start with formal physical therapy.  We will get laboratory work-up in case this is a compression fracture for further evaluation and patient is due for a bone density in August.  We will consider about getting this earlier.  Follow-up again in 5 weeks otherwise.  Update 05/02/2021 Tammy Kennedy is a 65 y.o. female coming in with complaint of back and R knee pain.  Patient had what appeared to be a pain uncertain timing of a compression fracture.  Started with physical therapy which patient has been going. Patient states that she is doing well overall PT is helping with the pain. Patient has no new concerns.  Lumbar xray 03/27/2021 IMPRESSION: Scoliosis with rotatory component. Age uncertain anterior wedging at T12 and L1 with milder anterior wedging at L3 and L5. No spondylolisthesis. Multilevel osteoarthritic change noted.  R knee xray 03/27/2021 IMPRESSION: No fracture, dislocation, or joint effusion. No appreciable arthropathy.      Most recent bone density test was in 2020 and showed osteopenia. Laboratory work-up did show the patient did have hypercalcemia at 11.3 as well as moderately elevated vitamin D at 53    Past Medical History:  Diagnosis Date   Alcohol  abuse 05/14/2016   ALCOHOL ABUSE, HX OF    ANEMIA    ANXIETY DISORDER    Basal cell carcinoma (BCC) 01/10/2018   C. difficile colitis    CROHN'S DISEASE    DEPRESSION    DRY EYE SYNDROME    HEPATOMEGALY, HX OF    HYPERLIPIDEMIA    intol of statin trials   HYPOTHYROIDISM    OSTEOPENIA    Osteoporosis 05/2017   states Pre osteoporosis 8/18   Rectovaginal fistula 2012   chron's disease   Tubular adenoma 2013   Past Surgical History:  Procedure Laterality Date   ANAL FISSURE REPAIR     CATARACT EXTRACTION     EYE SURGERY  11/2017   macular pucker   HEMORRHOID SURGERY     Left thumb surgery  1989   MOHS SURGERY  2008   Nose BCC   Theraputic abortion     Social History   Socioeconomic History   Marital status: Divorced    Spouse name: Not on file   Number of children: Not on file   Years of education: Not on file   Highest education level: Not on file  Occupational History   Not on file  Tobacco Use   Smoking status: Never   Smokeless tobacco: Never  Vaping Use   Vaping Use: Never used  Substance and Sexual Activity   Alcohol use: Yes    Comment: has drank excessively, not drinking daily   Drug use: No   Sexual activity: Not on file  Other Topics Concern   Not  on file  Social History Narrative   Works at Ha[employer]- [employer]- part time   Social Determinants of Radio broadcast assistant Strain: Not on [employer] Insecurity: Not on file  Transportation Needs: Not on file  Physical Activity: Not on file  Stress: Not on file  Social Connections: Not on file   No Known Allergies Family History  Adopted: Yes  Problem Relation Age of Onset   Breast cancer Neg Hx     Current Outpatient Medications (Endocrine & Metabolic):    levothyroxine (SYNTHROID) 88 MCG tablet, TAKE 1 TABLET BY MOUTH DAILY  Current Outpatient Medications (Cardiovascular):    fenofibrate 160 MG tablet, Take 1 tablet (160 mg total) by mouth daily.    Current  Outpatient Medications (Hematological):    folic acid (FOLVITE) 1 MG tablet, Take 2 mg by mouth daily.   vitamin B-12 (CYANOCOBALAMIN) 500 MCG tablet, Take 500 mcg by mouth daily.  Current Outpatient Medications (Other):    azaTHIOprine (IMURAN) 50 MG tablet, TAKE 2 TABLETS BY MOUTH DAILY   calcium-vitamin D (OSCAL WITH D) 500-200 MG-UNIT tablet, Take 1 tablet by mouth daily.   cholecalciferol (VITAMIN D3) 25 MCG (1000 UNIT) tablet, Take 1,000 Units by mouth daily.   mesalamine (APRISO) 0.375 g 24 hr capsule, Take 3 capsules (1.125 g total) by mouth daily. Take 3 tabs daily for 1 Month, Then take 4 Tabs daily from then on   Potassium Gluconate 550 MG TABS, Take 2 tablets by mouth daily.    Sennosides (EX-LAX PO), Take by mouth as directed.   Thiamine HCl 50 MG CAPS, Take 1 capsule (50 mg total) by mouth daily.   valACYclovir (VALTREX) 500 MG tablet, TAKE 1 TABLET(500 MG) BY MOUTH DAILY   venlafaxine XR (EFFEXOR-XR) 150 MG 24 hr capsule, TAKE 1 CAPSULE BY MOUTH EVERY MORNING WITH BREAKFAST   venlafaxine XR (EFFEXOR-XR) 75 MG 24 hr capsule, TAKE 1 CAPSULE BY MOUTH DAILY WITH BREAKFAST IN ADDITION TO 150 MG DAILY   Reviewed prior external information including notes and imaging from  primary care provider As well as notes that were available from care everywhere and other healthcare systems.  Past medical history, social, surgical and family history all reviewed in electronic medical record.  No pertanent information unless stated regarding to the chief complaint.   Review of Systems:  No headache, visual changes, nausea, vomiting, diarrhea, constipation, dizziness, abdominal pain, skin rash, fevers, chills, night sweats, weight loss, swollen lymph nodes, body aches, joint swelling, chest pain, shortness of breath, mood changes. POSITIVE muscle aches  Objective  Blood pressure 110/78, pulse 84, height 4' 11"  (1.499 m), weight 110 lb (49.9 kg), last menstrual period 10/12/2005, SpO2 98 %.    General: No apparent distress alert and oriented x3 mood and affect normal, dressed appropriately.  HEENT: Pupils equal, extraocular movements intact  Respiratory: Patient's speak in full sentences and does not appear short of breath  Cardiovascular: No lower extremity edema, non tender, no erythema  Gait normal with good balance and coordination.  MSK: Patient does have some tenderness to palpation over the greater trochanteric area on the right side.  Mild down the femur.  Patient does has a negative fulcrum.   Impression and Recommendations:    The above documentation has been reviewed and is accurate and complete Lyndal Pulley, DO

## 2021-04-25 NOTE — Therapy (Signed)
[facility] [address], [place], [place], [address] Phone: [phone]   Fax:  [phone]  Physical Therapy Treatment  Patient Details  Name: Tammy Kennedy MRN: [id number] Date of Birth: 11-Nov-1955 Referring Provider (PT): Lyndal Pulley, DO   Encounter Date: 04/25/2021   PT End of Session - 04/25/21 1059     Visit Number 4    Date for PT Re-Evaluation 06/11/21    Authorization Type BCBS    PT Start Time 1015    PT Stop Time 7322    PT Time Calculation (min) 43 min    Activity Tolerance Patient tolerated treatment well    Behavior During Therapy [facility] for tasks assessed/performed             Past Medical History:  Diagnosis Date   Alcohol abuse 05/14/2016   ALCOHOL ABUSE, HX OF    ANEMIA    ANXIETY DISORDER    Basal cell carcinoma (BCC) 01/10/2018   C. difficile colitis    CROHN'S DISEASE    DEPRESSION    DRY EYE SYNDROME    HEPATOMEGALY, HX OF    HYPERLIPIDEMIA    intol of statin trials   HYPOTHYROIDISM    OSTEOPENIA    Osteoporosis 05/2017   states Pre osteoporosis 8/18   Rectovaginal fistula 2012   chron's disease   Tubular adenoma 2013    Past Surgical History:  Procedure Laterality Date   ANAL FISSURE REPAIR     CATARACT EXTRACTION     EYE SURGERY  11/2017   macular pucker   HEMORRHOID SURGERY     Left thumb surgery  1989   MOHS SURGERY  2008   Nose [facility]   Theraputic abortion      There were no vitals filed for this visit.   Subjective Assessment - 04/25/21 1017     Subjective The massage gun helped but I am still a 5/10.  3/10 still in back pain.    Pertinent History osteopenia - related to chronic steroid use for Chron's disease - has mutliple anterior wedge vertebrae: T12, L1, L3, L5    Limitations Sitting;Standing;House hold activities;Lifting    How long can you sit comfortably? 20-30 min, Rt LE    How long can you stand comfortably? 20-30 min (for back)    How long can  you walk comfortably? hit or miss for Rt hip pain - is afraid to do too much walking    Diagnostic tests 03/2021 lumbar xray: severe lumbar degeneration, anterior wedging T12, L1, L3, L5, scoliosis, 03/2021 Rt knee xray negative    Patient Stated Goals pain relief, be able walk around the block or more for exercise (has housemate with cancer who needs to walk too)    Currently in Pain? Yes    Pain Score 3     Pain Location Back    Pain Orientation Lower;Mid    Pain Descriptors / Indicators Sore    Pain Type Chronic pain    Pain Onset More than a month ago    Pain Frequency Intermittent    Aggravating Factors  worse in AM, being still x 20', lifting/standing in kitchen                               [facility] Adult PT Treatment/Exercise - 04/25/21 0001       Neuro Re-ed    Neuro Re-ed Details  ongoing TC  and VC throughout session for proper postural alignment and deep core activation      Exercises   Exercises Lumbar;Knee/Hip;Shoulder      Lumbar Exercises: Stretches   Active Hamstring Stretch Left;Right;1 rep;30 seconds    Active Hamstring Stretch Limitations seated, VC to keep back straight    Hip Flexor Stretch Right;1 rep;20 seconds    Hip Flexor Stretch Limitations 1/2 kneel    Quad Stretch Right;30 seconds    Quad Stretch Limitations standing      Lumbar Exercises: Standing   Other Standing Lumbar Exercises anti rotation press, red tband, x10 Lt/Rt, then 10 reps pullforwards and backwards small range for postural alignment and core challenge      Lumbar Exercises: Supine   Straight Leg Raise 5 reps    Straight Leg Raises Limitations exhale on effort VC      Lumbar Exercises: Sidelying   Clam Right;15 reps    Clam Limitations 3x5 with TA cue to engage each rep    Hip Abduction Right;5 reps    Hip Abduction Limitations 5 each with short and long lever, TA indiv cue each rep due to fatigue with prolonged hold for multiple reps      Manual Therapy   Manual  Therapy Soft tissue mobilization    Soft tissue mobilization Addaday assisted STM Rt quad and ITB, supine, and gluteals and piriformis in Lt SL                    PT Education - 04/25/21 1056     Education Details added SL clam    Person(s) Educated Patient    Methods Explanation;Demonstration;Handout    Comprehension Verbalized understanding;Returned demonstration              PT Short Term Goals - 04/16/21 1258       PT SHORT TERM GOAL #1   Title ind with initial HEP    Time 4    Period Weeks    Status New    Target Date 05/14/21      PT SHORT TERM GOAL #2   Title Pt will be able to demo improved SLS balance on each LE x 10 sec    Time 4    Period Weeks    Status New    Target Date 05/14/21      PT SHORT TERM GOAL #3   Title Pt will report tolerance of 20-30 min of light standing housework without exacerbation of pain    Time 4    Period Weeks    Status New    Target Date 05/14/21      PT SHORT TERM GOAL #4   Title Pt will be able to sit x 30 min without exacerbation of pain    Time 4    Period Weeks    Status New    Target Date 05/14/21               PT Long Term Goals - 04/16/21 1300       PT LONG TERM GOAL #1   Title Pt will be ind with advanced HEP and learn dos and don'ts for bone health given ostepenia/compression fracture history.    Time 8    Period Weeks    Status New    Target Date 06/11/21      PT LONG TERM GOAL #2   Title FOTO score improved by at least 10 points to 62%    Baseline 52%  Time 8    Period Weeks    Status New    Target Date 06/11/21      PT LONG TERM GOAL #3   Title Improved LE strength of at least 4+/5 with functional ability to perform stairs at work/home and SLS x 10 sec with good knee and hip stability.    Time 8    Period Weeks    Status New    Target Date 06/11/21      PT LONG TERM GOAL #4   Title Reduced pain by at least 50% for back and Rt thigh with all daily tasks.    Time 8    Period  Weeks    Status New    Target Date 06/11/21      PT LONG TERM GOAL #5   Title Able to walk for exercise x at least 2 laps around block at home without exacerbation of pain    Time 8    Period Weeks    Status New    Target Date 06/11/21                   Plan - 04/25/21 1208     Clinical Impression Statement Pt reports pain is unchanged so far with PT.  She needs ongoing VC and TC for postural alignment and proper muscle engagement for trunk with all exercise to avoid sway back posture.  She demos ability to engage deep TA but is quick to fatigue.  PT encouraged ther ex as having 2 phases of concentration today: engage core, then do exercise, then relax and reset core for next rep.  PT added SL clam to HEP which is very weak so sets of 5 reps encouraged.  Pt continues to have soreness along Rt anterior and lateral thigh but also along lateral hip so Addaday used as this was reported to be beneficial last visit.    Comorbidities osteopenia with multiple anterior wedge compression fractures, scoliosis    Rehab Potential Good    PT Frequency 2x / week    PT Duration 8 weeks    PT Treatment/Interventions ADLs/Self Care Home Management;Electrical Stimulation;Moist Heat;Cryotherapy;Neuromuscular re-education;Balance training;Therapeutic exercise;Therapeutic activities;Functional mobility training;Patient/family education;Manual techniques;Passive range of motion;Dry needling;Taping    PT Next Visit Plan continue lumbopelvic stab with ongoing postural cueing, continue hip strength    PT Home Exercise Plan Access Code: [email or web address] + spinal decompression (without leg lengthener due to pain with this)    Consulted and Agree with Plan of Care Patient             Patient will benefit from skilled therapeutic intervention in order to improve the following deficits and impairments:     Visit Diagnosis: Pain in right hip  Chronic right-sided low back pain without sciatica  Abnormal  posture  Muscle weakness (generalized)     Problem List Patient Active Problem List   Diagnosis Date Noted   Degenerative disc disease, lumbar 03/27/2021   Right knee pain 07/29/2020   Hyperglycemia 04/01/2020   Lateral epicondylitis of right elbow 11/11/2018   Basal cell carcinoma (BCC) 01/10/2018   Gastroesophageal reflux disease 11/12/2017   Idiopathic scoliosis 05/14/2016   Alcohol abuse 05/14/2016   Rectovaginal fistula    C. difficile colitis    Crohn's disease ([place]) 05/27/2009   HEPATOMEGALY, HX OF 08/31/2008   Hypothyroidism 07/30/2008   Hypertriglyceridemia 07/30/2008   Anxiety 07/30/2008   Depression 07/30/2008   RENAL CYST 07/30/2008   DEGENERATIVE [place] DISEASE 07/30/2008  Osteopenia 07/30/2008    Concha Sudol, PT 04/25/21 12:12 PM   [facility] [address] [place], [place], [address] Phone: [phone]   Fax:  [phone]  Name: Tammy Kennedy MRN: [id number] Date of Birth: July 07, 1956

## 2021-04-29 ENCOUNTER — Ambulatory Visit: Payer: BC Managed Care – PPO | Admitting: Gastroenterology

## 2021-04-29 ENCOUNTER — Encounter: Payer: Self-pay | Admitting: Physical Therapy

## 2021-04-29 ENCOUNTER — Other Ambulatory Visit: Payer: Self-pay

## 2021-04-29 ENCOUNTER — Ambulatory Visit: Payer: BC Managed Care – PPO | Admitting: Physical Therapy

## 2021-04-29 ENCOUNTER — Ambulatory Visit
Admission: RE | Admit: 2021-04-29 | Discharge: 2021-04-29 | Disposition: A | Discharge status: Home / Self Care | Payer: BC Managed Care – PPO | Source: Ambulatory Visit | Attending: Gastroenterology | Admitting: Gastroenterology

## 2021-04-29 ENCOUNTER — Encounter: Payer: Self-pay | Admitting: Gastroenterology

## 2021-04-29 VITALS — BP 110/80 | HR 94 | Ht 59.0 in | Wt 111.8 lb

## 2021-04-29 DIAGNOSIS — G8929 Other chronic pain: Secondary | ICD-10-CM

## 2021-04-29 DIAGNOSIS — Z79899 Other long term (current) drug therapy: Secondary | ICD-10-CM

## 2021-04-29 DIAGNOSIS — K5080 Crohn's disease of both small and large intestine without complications: Secondary | ICD-10-CM

## 2021-04-29 DIAGNOSIS — D84821 Immunodeficiency due to drugs: Secondary | ICD-10-CM

## 2021-04-29 DIAGNOSIS — M25551 Pain in right hip: Secondary | ICD-10-CM | POA: Diagnosis not present

## 2021-04-29 DIAGNOSIS — M6281 Muscle weakness (generalized): Secondary | ICD-10-CM | POA: Diagnosis not present

## 2021-04-29 DIAGNOSIS — M545 Low back pain, unspecified: Secondary | ICD-10-CM | POA: Diagnosis not present

## 2021-04-29 DIAGNOSIS — R293 Abnormal posture: Secondary | ICD-10-CM | POA: Diagnosis not present

## 2021-04-29 NOTE — Progress Notes (Signed)
Tammy Kennedy    [id number]    [birth year]  Primary Care Physician:Burns, Claudina Lick, MD  Referring Physician: Binnie Rail, MD [place],  [place] [address]   Chief complaint:  Crohn's disease  HPI: 65 year old very pleasant female with history of Crohn's disease involving ileum and colon, history of fistulizing disease with rectovaginal fistula here for follow-up visit   Currently asymptomatic on Imuran 50 mg daily.  She is also taking Apriso 2 tablets daily. Imuran dose was decreased to 50 mg daily at last office visit in January 2022  Denies any nausea, vomiting, abdominal pain, melena or bright red blood per rectum Overall doing well from GI standpoint  Lower back compression fracture with osteoarthritic changes L3-L5, high vitamin D level.   Vitamin D level was elevated at 73, according to patient she was not taking any supplements at that time.  She started taking 2000 international units of vitamin D and 1 tablet calcium since June 2022   Colonoscopy April 23, 2020: Mild inactive colitis, quiescent.  Right and left colon biopsies showed benign mucosa with no active inflammation.  Diverticulosis   Outpatient Encounter Medications as of 04/29/2021  Medication Sig   azaTHIOprine (IMURAN) 50 MG tablet TAKE 2 TABLETS BY MOUTH DAILY   calcium-vitamin D (OSCAL WITH D) 500-200 MG-UNIT tablet Take 2 tablets by mouth daily.   fenofibrate 160 MG tablet Take 1 tablet (160 mg total) by mouth daily.   folic acid (FOLVITE) 1 MG tablet Take 2 mg by mouth daily.   levothyroxine (SYNTHROID) 88 MCG tablet TAKE 1 TABLET BY MOUTH DAILY   mesalamine (APRISO) 0.375 g 24 hr capsule TAKE 2 CAPSULES(0.75 GRAM) BY MOUTH DAILY   Potassium Gluconate 550 MG TABS Take 2 tablets by mouth daily.    Sennosides (EX-LAX PO) Take by mouth as directed.   Thiamine HCl 50 MG CAPS Take 1 capsule (50 mg total) by mouth daily.   valACYclovir (VALTREX) 500 MG tablet TAKE 1 TABLET(500  MG) BY MOUTH DAILY   venlafaxine XR (EFFEXOR-XR) 150 MG 24 hr capsule TAKE 1 CAPSULE BY MOUTH EVERY MORNING WITH BREAKFAST   venlafaxine XR (EFFEXOR-XR) 75 MG 24 hr capsule TAKE 1 CAPSULE BY MOUTH DAILY WITH BREAKFAST IN ADDITION TO 150 MG DAILY   vitamin B-12 (CYANOCOBALAMIN) 500 MCG tablet Take 500 mcg by mouth daily.   No facility-administered encounter medications on file as of 04/29/2021.    Allergies as of 04/29/2021   (No Known Allergies)    Past Medical History:  Diagnosis Date   Alcohol abuse 05/14/2016   ALCOHOL ABUSE, HX OF    ANEMIA    ANXIETY DISORDER    Basal cell carcinoma (BCC) 01/10/2018   C. difficile colitis    CROHN'S DISEASE    DEPRESSION    DRY EYE SYNDROME    HEPATOMEGALY, HX OF    HYPERLIPIDEMIA    intol of statin trials   HYPOTHYROIDISM    OSTEOPENIA    Osteoporosis 05/2017   states Pre osteoporosis 8/18   Rectovaginal fistula 2012   chron's disease   Tubular adenoma 2013    Past Surgical History:  Procedure Laterality Date   ANAL FISSURE REPAIR     CATARACT EXTRACTION     EYE SURGERY  11/2017   macular pucker   HEMORRHOID SURGERY     Left thumb surgery  1989   MOHS SURGERY  2008   Nose [facility]  Theraputic abortion      Family History  Adopted: Yes  Problem Relation Age of Onset   Breast cancer Neg Hx     Social History   Socioeconomic History   Marital status: Divorced    Spouse name: Not on file   Number of children: Not on file   Years of education: Not on file   Highest education level: Not on file  Occupational History   Not on file  Tobacco Use   Smoking status: Never   Smokeless tobacco: Never  Vaping Use   Vaping Use: Never used  Substance and Sexual Activity   Alcohol use: Yes    Comment: has drank excessively, not drinking daily   Drug use: No   Sexual activity: Not on file  Other Topics Concern   Not on file  Social History Narrative   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   Physical Exam: There were no vitals filed for this visit. There is no height or weight on file to calculate BMI. Gen:      No acute distress HEENT:  sclera anicteric Abd:      soft, non-tender; no palpable masses, no distension Ext:    No edema Neuro: alert and oriented x 3 Psych: normal mood and affect  Data Reviewed:  Reviewed labs, radiology imaging, old records and pertinent past GI work up   Assessment and Plan/Recommendations:  65 year old very pleasant female with history of Crohn's disease rectovaginal fistula currently in remission on Imuran and Apriso   Crohn's disease: Inactive colitis based on recent colonoscopy, remains asymptomatic  We will plan to continue to taper off Imuran, decrease dose to 25 mg daily for a month and then stop. Discussed potential side effects with long-term use of Imuran/immunosuppressive therapy.   Increase Apriso to 3 tablets daily for a month and then to 4 tablets daily, total 1.5 g daily to maintain remission   High vitamin D level and osteoarthritis changes in the lower back, possible demineralization secondary to high vitamin D in addition to inflammatory bowel disease and chronic immunosuppressive therapy.   Advised patient to stop taking vitamin D supplements to prevent toxicity and recheck vitamin D level in 3 to 6 months Check bone density scan for IBD health maintenance  Return in 4 months or sooner if needed  This visit required 30 minutes of patient care (this includes precharting, chart review, review of results, face-to-face time used for counseling as well as treatment plan  and follow-up. The patient was provided an opportunity to ask questions and all were answered. The patient agreed with the plan and demonstrated an understanding of the instructions.  Damaris Hippo , MD    CC: Binnie Rail, MD

## 2021-04-29 NOTE — Patient Instructions (Signed)
You have been scheduled for a bone density test on _________ at _________. Please arrive 15 minutes prior to your scheduled appointment to radiology on the basement floor of [place] location for this test. If you need to cancel or reschedule for any reason, please contact radiology at [phone].  Preparation for test is as follows:  If you are taking calcium, discontinue this 24-48 hours prior to your appointment.  Wear pants with an elastic waistband (or without any metal such as a zipper).  Do not wear an underwire bra.  We do have gowns if you are unable to find appropriate clothing without metal.  Please bring a list of all current medications.   Decrease Imuran to 25 mg daily for 1 month then stop  Increase Apriso to 3 tablets for 1 month then up to 4 tablets  AVOID Vitamin D supplements and calcium   Follow up in 4 months   Due to recent changes in healthcare laws, you may see the results of your imaging and laboratory studies on MyChart before your provider has had a chance to review them.  We understand that in some cases there may be results that are confusing or concerning to you. Not all laboratory results come back in the same time frame and the provider may be waiting for multiple results in order to interpret others.  Please give [place] 48 hours in order for your provider to thoroughly review all the results before contacting the office for clarification of your results.    If you are age 50 or older, your body mass index should be between 23-30. Your Body mass index is 22.58 kg/m. If this is out of the aforementioned range listed, please consider follow up with your Primary Care Provider.  If you are age 66 or younger, your body mass index should be between 19-25. Your Body mass index is 22.58 kg/m. If this is out of the aformentioned range listed, please consider follow up with your Primary Care Provider.    __________________________________________________________  The [facility] providers would like to encourage you to use [facility] to communicate with providers for non-urgent requests or questions.  Due to long hold times on the telephone, sending your provider a message by [facility] may be a faster and more efficient way to get a response.  Please allow 48 business hours for a response.  Please remember that this is for non-urgent requests.    .Thank you for choosing [facility]  Karleen Hampshire Nandigam,MD

## 2021-04-29 NOTE — Therapy (Signed)
[facility] [address] [place], [place], [address] Phone: [phone]   Fax:  [phone]  Physical Therapy Treatment  Patient Details  Name: Tammy Kennedy MRN: [id number] Date of Birth: [birth year] Referring Provider (PT): Lyndal Pulley, DO   Encounter Date: 04/29/2021   PT End of Session - 04/29/21 1615     Visit Number 5    Date for PT Re-Evaluation 06/11/21    Authorization Type BCBS    PT Start Time 1530    PT Stop Time 1610    PT Time Calculation (min) 40 min    Activity Tolerance Patient tolerated treatment well    Behavior During Therapy [facility] for tasks assessed/performed             Past Medical History:  Diagnosis Date   Alcohol abuse 05/14/2016   ALCOHOL ABUSE, HX OF    ANEMIA    ANXIETY DISORDER    Basal cell carcinoma (BCC) 01/10/2018   C. difficile colitis    CROHN'S DISEASE    DEPRESSION    DRY EYE SYNDROME    HEPATOMEGALY, HX OF    HYPERLIPIDEMIA    intol of statin trials   HYPOTHYROIDISM    OSTEOPENIA    Osteoporosis 05/2017   states Pre osteoporosis 8/18   Rectovaginal fistula 2012   chron's disease   Tubular adenoma 2013    Past Surgical History:  Procedure Laterality Date   ANAL FISSURE REPAIR     CATARACT EXTRACTION     EYE SURGERY  11/2017   macular pucker   HEMORRHOID SURGERY     Left thumb surgery  1989   MOHS SURGERY  2008   Nose [facility]   Theraputic abortion      There were no vitals filed for this visit.   Subjective Assessment - 04/29/21 1533     Subjective I am about the same.  I have learned a lot and things are starting to make sense about what I need to do.    Pertinent History osteopenia - related to chronic steroid use for Chron's disease - has mutliple anterior wedge vertebrae: T12, L1, L3, L5    Limitations Sitting;Standing;House hold activities;Lifting    How long can you sit comfortably? 20-30 min, Rt LE    How long can you stand comfortably?  20-30 min (for back)    How long can you walk comfortably? hit or miss for Rt hip pain - is afraid to do too much walking    Diagnostic tests 03/2021 lumbar xray: severe lumbar degeneration, anterior wedging T12, L1, L3, L5, scoliosis, 03/2021 Rt knee xray negative    Patient Stated Goals pain relief, be able walk around the block or more for exercise (has housemate with cancer who needs to walk too)    Currently in Pain? Yes    Pain Score 3     Pain Location Back    Pain Score 5    Pain Location Hip    Pain Orientation Right;Anterior    Pain Descriptors / Indicators Tightness;Aching;Squeezing    Pain Type Acute pain;Chronic pain                               [facility] Adult PT Treatment/Exercise - 04/29/21 0001       Exercises   Exercises Lumbar;Knee/Hip;Shoulder      Lumbar Exercises: Supine   Bridge with [employer] 10 reps  Bridge with [employer] Limitations 2x5, exhale on lift    Straight Leg Raise 10 reps    Straight Leg Raises Limitations exhale on effort VC, Rt and Lt      Lumbar Exercises: Quadruped   Plank elevated table plank with hip extension 2x5, Rt and Lt, TC for TA and avoiding lumbar hinge, tapping at glut max for activation      Knee/Hip Exercises: Stretches   Sports administrator Right;30 seconds;1 rep    Sports administrator Limitations standing    Hip Flexor Stretch Right;30 seconds;1 rep    Hip Flexor Stretch Limitations kneeling      Knee/Hip Exercises: Standing   Hip Abduction Both;1 set;10 reps;Knee straight    Abduction Limitations single UE support on counter    Lateral Step Up Left;Right;1 set;10 reps;Hand Hold: 1;Step Height: 6"    Forward Step Up 1 set;10 reps;Hand Hold: 2    Forward Step Up Limitations march tap to 2nd step, light UE use for balance      Knee/Hip Exercises: Sidelying   Hip ABduction Strengthening;AROM;Right;Left;1 set;5 reps    Hip ABduction Limitations with VC for TA indraw maintained    Clams 1x5 bil with green loop  band      Shoulder Exercises: Standing   Flexion Strengthening;Both;15 reps;Theraband    Theraband Level (Shoulder Flexion) Level 2 (Red)    Flexion Limitations to 30 deg for lumbar multifidi activation, improving postural alignement awareness      Manual Therapy   Manual Therapy Joint mobilization    Joint Mobilization lumbar PAVMS on Rt in Lt SL Gr I-III T8-L1                      PT Short Term Goals - 04/16/21 1258       PT SHORT TERM GOAL #1   Title ind with initial HEP    Time 4    Period Weeks    Status New    Target Date 05/14/21      PT SHORT TERM GOAL #2   Title Pt will be able to demo improved SLS balance on each LE x 10 sec    Time 4    Period Weeks    Status New    Target Date 05/14/21      PT SHORT TERM GOAL #3   Title Pt will report tolerance of 20-30 min of light standing housework without exacerbation of pain    Time 4    Period Weeks    Status New    Target Date 05/14/21      PT SHORT TERM GOAL #4   Title Pt will be able to sit x 30 min without exacerbation of pain    Time 4    Period Weeks    Status New    Target Date 05/14/21               PT Long Term Goals - 04/16/21 1300       PT LONG TERM GOAL #1   Title Pt will be ind with advanced HEP and learn dos and don'ts for bone health given ostepenia/compression fracture history.    Time 8    Period Weeks    Status New    Target Date 06/11/21      PT LONG TERM GOAL #2   Title FOTO score improved by at least 10 points to 62%    Baseline 52%    Time 8    Period  Weeks    Status New    Target Date 06/11/21      PT LONG TERM GOAL #3   Title Improved LE strength of at least 4+/5 with functional ability to perform stairs at work/home and SLS x 10 sec with good knee and hip stability.    Time 8    Period Weeks    Status New    Target Date 06/11/21      PT LONG TERM GOAL #4   Title Reduced pain by at least 50% for back and Rt thigh with all daily tasks.    Time 8     Period Weeks    Status New    Target Date 06/11/21      PT LONG TERM GOAL #5   Title Able to walk for exercise x at least 2 laps around block at home without exacerbation of pain    Time 8    Period Weeks    Status New    Target Date 06/11/21                   Plan - 04/29/21 1616     Clinical Impression Statement Pt reports she is learning a lot about muscles and posture but no change in pain yet in back and hip pain.  She continues to have signif weakness and poor endurance in deep abdominals, lumbar stabilizers and gluteals.  She requires ongoing TC and VC during ther ex for activation and maintenance of deep core control with overlay of UE/LE exercise.  PT performed manual therapy to Rt lower t-spine and upper l-spine on Rt for joint gapping and release of paraspinals to see if this makes any change to Rt anterior hip pain/tightness given direct STM and stretching have not alleviated symptoms of Rt LE.  Continue along POC with ongoing assessment.    Personal Factors and Comorbidities Comorbidity 1;Comorbidity 2    Comorbidities osteopenia with multiple anterior wedge compression fractures, scoliosis    PT Frequency 2x / week    PT Duration 8 weeks    PT Treatment/Interventions ADLs/Self Care Home Management;Electrical Stimulation;Moist Heat;Cryotherapy;Neuromuscular re-education;Balance training;Therapeutic exercise;Therapeutic activities;Functional mobility training;Patient/family education;Manual techniques;Passive range of motion;Dry needling;Taping    PT Next Visit Plan continue lumbopelvic stab with ongoing postural cueing, continue hip strength, did manual therapy to Rt spine help Rt LE symptoms last visit?    PT Home Exercise Plan Access Code: [email or web address] + spinal decompression (without leg lengthener due to pain with this)    Consulted and Agree with Plan of Care Patient             Patient will benefit from skilled therapeutic intervention in order to improve the  following deficits and impairments:     Visit Diagnosis: Pain in right hip  Chronic right-sided low back pain without sciatica  Abnormal posture  Muscle weakness (generalized)     Problem List Patient Active Problem List   Diagnosis Date Noted   Degenerative disc disease, lumbar 03/27/2021   Right knee pain 07/29/2020   Hyperglycemia 04/01/2020   Lateral epicondylitis of right elbow 11/11/2018   Basal cell carcinoma (BCC) 01/10/2018   Gastroesophageal reflux disease 11/12/2017   Idiopathic scoliosis 05/14/2016   Alcohol abuse 05/14/2016   Rectovaginal fistula    C. difficile colitis    Crohn's disease ([employer]) 05/27/2009   HEPATOMEGALY, HX OF 08/31/2008   Hypothyroidism 07/30/2008   Hypertriglyceridemia 07/30/2008   Anxiety 07/30/2008   Depression 07/30/2008   RENAL CYST 07/30/2008  DEGENERATIVE DISC DISEASE 07/30/2008   Osteopenia 07/30/2008    Karmello Abercrombie, PT 04/29/21 5:22 PM   [facility] [address] [place], [place], [address] Phone: [phone]   Fax:  [phone]  Name: Tammy Kennedy MRN: [id number] Date of Birth: 1956/07/21

## 2021-04-30 ENCOUNTER — Other Ambulatory Visit: Payer: Self-pay

## 2021-04-30 MED ORDER — MESALAMINE ER 0.375 G PO CP24: 1125.0000 mg | ORAL_CAPSULE | Freq: Every day | ORAL | 7 refills | Status: DC

## 2021-05-01 ENCOUNTER — Other Ambulatory Visit: Payer: Self-pay | Admitting: *Deleted

## 2021-05-01 ENCOUNTER — Ambulatory Visit: Payer: BC Managed Care – PPO | Admitting: Physical Therapy

## 2021-05-01 ENCOUNTER — Other Ambulatory Visit: Payer: Self-pay

## 2021-05-01 DIAGNOSIS — G8929 Other chronic pain: Secondary | ICD-10-CM

## 2021-05-01 DIAGNOSIS — M6281 Muscle weakness (generalized): Secondary | ICD-10-CM

## 2021-05-01 DIAGNOSIS — R293 Abnormal posture: Secondary | ICD-10-CM

## 2021-05-01 DIAGNOSIS — M25551 Pain in right hip: Secondary | ICD-10-CM

## 2021-05-01 DIAGNOSIS — M545 Low back pain, unspecified: Secondary | ICD-10-CM | POA: Diagnosis not present

## 2021-05-01 MED ORDER — MESALAMINE ER 0.375 G PO CP24: 1125.0000 mg | ORAL_CAPSULE | Freq: Every day | ORAL | 7 refills | Status: DC

## 2021-05-01 NOTE — Therapy (Signed)
[facility] [address] [place], [place], [address] Phone: [phone]   Fax:  [phone]  Physical Therapy Treatment  Patient Details  Name: Tammy Kennedy MRN: [id number] Date of Birth: August 31, 1956 Referring Provider (PT): Lyndal Pulley, DO   Encounter Date: 05/01/2021   PT End of Session - 05/01/21 1336     Visit Number 6    Date for PT Re-Evaluation 06/11/21    Authorization Type BCBS    PT Start Time 0930    PT Stop Time 1010    PT Time Calculation (min) 40 min    Activity Tolerance Patient tolerated treatment well;No increased pain    Behavior During Therapy [facility] for tasks assessed/performed             Past Medical History:  Diagnosis Date   Alcohol abuse 05/14/2016   ALCOHOL ABUSE, HX OF    ANEMIA    ANXIETY DISORDER    Basal cell carcinoma (BCC) 01/10/2018   C. difficile colitis    CROHN'S DISEASE    DEPRESSION    DRY EYE SYNDROME    HEPATOMEGALY, HX OF    HYPERLIPIDEMIA    intol of statin trials   HYPOTHYROIDISM    OSTEOPENIA    Osteoporosis 05/2017   states Pre osteoporosis 8/18   Rectovaginal fistula 2012   chron's disease   Tubular adenoma 2013    Past Surgical History:  Procedure Laterality Date   ANAL FISSURE REPAIR     CATARACT EXTRACTION     EYE SURGERY  11/2017   macular pucker   HEMORRHOID SURGERY     Left thumb surgery  1989   MOHS SURGERY  2008   Nose [facility]   Theraputic abortion      There were no vitals filed for this visit.   Subjective Assessment - 05/01/21 0934     Subjective Feels that she is improving and getting stronger.    Pertinent History osteopenia - related to chronic steroid use for Chron's disease - has mutliple anterior wedge vertebrae: T12, L1, L3, L5    Limitations Sitting;Standing;House hold activities;Lifting    How long can you sit comfortably? 20-30 min, Rt LE    How long can you stand comfortably? 20-30 min (for back)    How long can you  walk comfortably? hit or miss for Rt hip pain - is afraid to do too much walking    Diagnostic tests 03/2021 lumbar xray: severe lumbar degeneration, anterior wedging T12, L1, L3, L5, scoliosis, 03/2021 Rt knee xray negative    Patient Stated Goals pain relief, be able walk around the block or more for exercise (has housemate with cancer who needs to walk too)    Currently in Pain? Yes    Pain Score 5     Pain Location Back    Pain Orientation Lower;Mid    Pain Descriptors / Indicators Sore    Pain Type Chronic pain    Pain Onset More than a month ago    Pain Score 2    Pain Location Hip    Pain Orientation Right    Pain Descriptors / Indicators Dull    Pain Type Acute pain;Chronic pain    Pain Onset More than a month ago                               [facility] Adult PT Treatment/Exercise - 05/01/21 0001  Exercises   Exercises Lumbar;Knee/Hip;Shoulder      Lumbar Exercises: Standing   Shoulder Extension Strengthening;Both;15 reps    Theraband Level (Shoulder Extension) Level 3 (Green)    Shoulder Extension Limitations into 30 degrees flexion    Other Standing Lumbar Exercises anti rotation press, red tband, x10 Lt/Rt, then 10 reps pullforwards and backwards      Lumbar Exercises: Supine   Bridge with [employer] --    Bridge with [employer] Limitations 2x5, exhale on lift    Straight Leg Raise 10 reps    Straight Leg Raises Limitations exhale on effort VC, Rt and Lt      Lumbar Exercises: Quadruped   Plank kneeling plank at mat table; 2 x 20s hold      Knee/Hip Exercises: Stretches   Sports administrator Right;30 seconds;1 rep    Sports administrator Limitations standing    Hip Flexor Stretch Right;30 seconds;1 rep    Hip Flexor Stretch Limitations kneeling      Knee/Hip Exercises: Standing   Hip Abduction Both;1 set;10 reps;Knee straight    Abduction Limitations single UE support on counter    Lateral Step Up Left;Right;1 set;10 reps;Hand Hold: 1;Step Height:  6"    Forward Step Up Right;Left;1 set;10 reps;Hand Hold: 1;Step Height: 6"    Forward Step Up Limitations --      Knee/Hip Exercises: Sidelying   Hip ABduction Strengthening;AROM;Right;Left;1 set;10 reps    Hip ABduction Limitations TC for decreased hip flexion    Clams x10 Lt/Rt; green loop; cuing for decreased pelvic rotation      Shoulder Exercises: Standing   Flexion --    Theraband Level (Shoulder Flexion) --    Flexion Limitations --      Manual Therapy   Manual Therapy --    Joint Mobilization --                      PT Short Term Goals - 05/01/21 1336       PT SHORT TERM GOAL #1   Title ind with initial HEP    Time 4    Period Weeks    Status On-going    Target Date 05/14/21      PT SHORT TERM GOAL #3   Title Pt will report tolerance of 20-30 min of light standing housework without exacerbation of pain    Time 4    Period Weeks    Status On-going    Target Date 05/14/21               PT Long Term Goals - 04/16/21 1300       PT LONG TERM GOAL #1   Title Pt will be ind with advanced HEP and learn dos and don'ts for bone health given ostepenia/compression fracture history.    Time 8    Period Weeks    Status New    Target Date 06/11/21      PT LONG TERM GOAL #2   Title FOTO score improved by at least 10 points to 62%    Baseline 52%    Time 8    Period Weeks    Status New    Target Date 06/11/21      PT LONG TERM GOAL #3   Title Improved LE strength of at least 4+/5 with functional ability to perform stairs at work/home and SLS x 10 sec with good knee and hip stability.    Time 8    Period Weeks  Status New    Target Date 06/11/21      PT LONG TERM GOAL #4   Title Reduced pain by at least 50% for back and Rt thigh with all daily tasks.    Time 8    Period Weeks    Status New    Target Date 06/11/21      PT LONG TERM GOAL #5   Title Able to walk for exercise x at least 2 laps around block at home without exacerbation of  pain    Time 8    Period Weeks    Status New    Target Date 06/11/21                   Plan - 05/01/21 1334     Clinical Impression Statement Patient requiring tactile cues for decreased hip flexion when performing sidelying hip abduction. She demonstrates muscles trembling and other signs of fatigue when performing forward step up to 6 inch step indicating need for continued quad strengthening. Patient reporting decreased back pain at end of session. She would benefit from continued skilled intervention to address impairments for decreased pain and improved activity tolerance.    Personal Factors and Comorbidities Comorbidity 1;Comorbidity 2    Comorbidities osteopenia with multiple anterior wedge compression fractures, scoliosis    Examination-Activity Limitations Lift;Stand;Stairs;Sit;Bend;Locomotion Level    Examination-Participation Restrictions Meal Prep;Cleaning;Community Activity;Driving;Laundry;Yard Work    Rehab Potential Good    PT Frequency 2x / week    PT Duration 8 weeks    PT Treatment/Interventions ADLs/Self Care Home Management;Electrical Stimulation;Moist Heat;Cryotherapy;Neuromuscular re-education;Balance training;Therapeutic exercise;Therapeutic activities;Functional mobility training;Patient/family education;Manual techniques;Passive range of motion;Dry needling;Taping    PT Next Visit Plan continue lumbopelvic stab with ongoing postural cueing, continue hip strength, did manual therapy to Rt spine help Rt LE symptoms last visit?    PT Home Exercise Plan Access Code: [email or web address] + spinal decompression (without leg lengthener due to pain with this)    Consulted and Agree with Plan of Care Patient             Patient will benefit from skilled therapeutic intervention in order to improve the following deficits and impairments:  Decreased range of motion, Increased muscle spasms, Postural dysfunction, Decreased strength, Decreased mobility, Impaired flexibility,  Pain, Decreased balance, Decreased activity tolerance  Visit Diagnosis: Pain in right hip  Chronic right-sided low back pain without sciatica  Abnormal posture  Muscle weakness (generalized)     Problem List Patient Active Problem List   Diagnosis Date Noted   Degenerative disc disease, lumbar 03/27/2021   Right knee pain 07/29/2020   Hyperglycemia 04/01/2020   Lateral epicondylitis of right elbow 11/11/2018   Basal cell carcinoma (BCC) 01/10/2018   Gastroesophageal reflux disease 11/12/2017   Idiopathic scoliosis 05/14/2016   Alcohol abuse 05/14/2016   Rectovaginal fistula    C. difficile colitis    Crohn's disease ([place]) 05/27/2009   HEPATOMEGALY, HX OF 08/31/2008   Hypothyroidism 07/30/2008   Hypertriglyceridemia 07/30/2008   Anxiety 07/30/2008   Depression 07/30/2008   RENAL CYST 07/30/2008   DEGENERATIVE DISC DISEASE 07/30/2008   Osteopenia 07/30/2008   Everardo All PT, DPT  05/01/21 1:38 PM   [facility] [address], [place], [place], [address] Phone: [phone]   Fax:  [phone]  Name: Tammy Kennedy MRN: [id number] Date of Birth: [birth year]

## 2021-05-02 ENCOUNTER — Encounter: Payer: Self-pay | Admitting: Family Medicine

## 2021-05-02 ENCOUNTER — Ambulatory Visit: Payer: BC Managed Care – PPO | Admitting: Family Medicine

## 2021-05-02 VITALS — BP 110/78 | HR 84 | Ht 59.0 in | Wt 110.0 lb

## 2021-05-02 DIAGNOSIS — M5136 Other intervertebral disc degeneration, lumbar region: Secondary | ICD-10-CM

## 2021-05-02 DIAGNOSIS — M85852 Other specified disorders of bone density and structure, left thigh: Secondary | ICD-10-CM | POA: Diagnosis not present

## 2021-05-02 DIAGNOSIS — K50919 Crohn's disease, unspecified, with unspecified complications: Secondary | ICD-10-CM

## 2021-05-02 NOTE — Assessment & Plan Note (Signed)
Awaiting new bone scan.

## 2021-05-02 NOTE — Patient Instructions (Addendum)
Good to see you  You are making good strides Continue to monitor the symptoms  No changes in management See me again in 2 months

## 2021-05-02 NOTE — Assessment & Plan Note (Signed)
Known degenerative disc disease, history of Crohn's disease, patient also has compression fractures but responded well to conservative therapy.  No significant changes in management with patient doing so well.  Was having still some mild right-sided hip pain.  We discussed potential injections but patient does feel like she is improving.  Patient will follow up again 6 to 8 weeks.  Hopefully at that point we can release her if she is doing well.

## 2021-05-06 ENCOUNTER — Other Ambulatory Visit: Payer: Self-pay

## 2021-05-06 ENCOUNTER — Ambulatory Visit: Payer: BC Managed Care – PPO | Admitting: Physical Therapy

## 2021-05-06 DIAGNOSIS — M25551 Pain in right hip: Secondary | ICD-10-CM

## 2021-05-06 DIAGNOSIS — G8929 Other chronic pain: Secondary | ICD-10-CM

## 2021-05-06 DIAGNOSIS — R293 Abnormal posture: Secondary | ICD-10-CM | POA: Diagnosis not present

## 2021-05-06 DIAGNOSIS — M6281 Muscle weakness (generalized): Secondary | ICD-10-CM

## 2021-05-06 DIAGNOSIS — M545 Low back pain, unspecified: Secondary | ICD-10-CM | POA: Diagnosis not present

## 2021-05-06 NOTE — Therapy (Signed)
[facility] [address], [place], [place], [address] Phone: [phone]   Fax:  [phone]  Physical Therapy Treatment  Patient Details  Name: Tammy Kennedy MRN: [id number] Date of Birth: 1956/07/26 Referring Provider (PT): Lyndal Pulley, DO   Encounter Date: 05/06/2021   PT End of Session - 05/06/21 1706     Visit Number 7    Date for PT Re-Evaluation 06/11/21    Authorization Type BCBS    PT Start Time 1402    PT Stop Time 2706    PT Time Calculation (min) 41 min    Activity Tolerance Patient tolerated treatment well;No increased pain    Behavior During Therapy [facility] for tasks assessed/performed             Past Medical History:  Diagnosis Date   Alcohol abuse 05/14/2016   ALCOHOL ABUSE, HX OF    ANEMIA    ANXIETY DISORDER    Basal cell carcinoma (BCC) 01/10/2018   C. difficile colitis    CROHN'S DISEASE    DEPRESSION    DRY EYE SYNDROME    HEPATOMEGALY, HX OF    HYPERLIPIDEMIA    intol of statin trials   HYPOTHYROIDISM    OSTEOPENIA    Osteoporosis 05/2017   states Pre osteoporosis 8/18   Rectovaginal fistula 2012   chron's disease   Tubular adenoma 2013    Past Surgical History:  Procedure Laterality Date   ANAL FISSURE REPAIR     CATARACT EXTRACTION     EYE SURGERY  11/2017   macular pucker   HEMORRHOID SURGERY     Left thumb surgery  1989   MOHS SURGERY  2008   Nose [facility]   Theraputic abortion      There were no vitals filed for this visit.   Subjective Assessment - 05/06/21 1403     Subjective Is tired today but otherwise feels pretty good.    Pertinent History osteopenia - related to chronic steroid use for Chron's disease - has mutliple anterior wedge vertebrae: T12, L1, L3, L5    Limitations Sitting;Standing;House hold activities;Lifting    How long can you sit comfortably? 20-30 min, Rt LE    How long can you stand comfortably? 20-30 min (for back)    How long can you  walk comfortably? hit or miss for Rt hip pain - is afraid to do too much walking    Diagnostic tests 03/2021 lumbar xray: severe lumbar degeneration, anterior wedging T12, L1, L3, L5, scoliosis, 03/2021 Rt knee xray negative    Patient Stated Goals pain relief, be able walk around the block or more for exercise (has housemate with cancer who needs to walk too)    Currently in Pain? Yes    Pain Score 2     Pain Location Back    Pain Orientation Lower;Mid    Pain Descriptors / Indicators Sore    Pain Type Chronic pain    Pain Onset More than a month ago    Multiple Pain Sites Yes    Pain Score 2    Pain Location Hip    Pain Orientation Right    Pain Descriptors / Indicators Dull    Pain Type Acute pain;Chronic pain    Pain Onset More than a month ago                               [facility]  Adult PT Treatment/Exercise - 05/06/21 0001       Exercises   Exercises Lumbar;Knee/Hip;Shoulder      Lumbar Exercises: Stretches   Hip Flexor Stretch Right;Left;1 rep;20 seconds    Hip Flexor Stretch Limitations 1/2 kneel      Lumbar Exercises: Standing   Shoulder Extension Strengthening;Both;15 reps    Theraband Level (Shoulder Extension) Level 3 (Green)    Other Standing Lumbar Exercises anti rotation press, green tband, x10 Lt/Rt      Lumbar Exercises: Supine   Bridge with Ball Squeeze 10 reps;3 seconds    Straight Leg Raise 20 reps    Straight Leg Raises Limitations exhale on effort VC, Rt and Lt      Lumbar Exercises: Quadruped   Plank kneeling plank at mat table; 2 x 20s hold      Knee/Hip Exercises: Stretches   Sports administrator Right;30 seconds;1 rep    Sports administrator Limitations standing    Hip Flexor Stretch Right;30 seconds;1 rep    Hip Flexor Stretch Limitations kneeling      Knee/Hip Exercises: Standing   Lateral Step Up Left;Right;1 set;10 reps;Hand Hold: 1;Step Height: 6"    Forward Step Up Right;Left;1 set;10 reps;Hand Hold: 1;Step Height: 6"      Knee/Hip  Exercises: Sidelying   Hip ABduction Strengthening;AROM;Right;Left;1 set;10 reps    Hip ABduction Limitations TC for decreased hip external rotation    Clams x10 Lt/Rt; green loop; cuing for decreased pelvic rotation                      PT Short Term Goals - 05/06/21 1705       PT SHORT TERM GOAL #1   Title ind with initial HEP    Time 4    Period Weeks    Status On-going    Target Date 05/14/21      PT SHORT TERM GOAL #4   Title Pt will be able to sit x 30 min without exacerbation of pain    Time 4    Period Weeks    Status On-going    Target Date 05/14/21               PT Long Term Goals - 04/16/21 1300       PT LONG TERM GOAL #1   Title Pt will be ind with advanced HEP and learn dos and don'ts for bone health given ostepenia/compression fracture history.    Time 8    Period Weeks    Status New    Target Date 06/11/21      PT LONG TERM GOAL #2   Title FOTO score improved by at least 10 points to 62%    Baseline 52%    Time 8    Period Weeks    Status New    Target Date 06/11/21      PT LONG TERM GOAL #3   Title Improved LE strength of at least 4+/5 with functional ability to perform stairs at work/home and SLS x 10 sec with good knee and hip stability.    Time 8    Period Weeks    Status New    Target Date 06/11/21      PT LONG TERM GOAL #4   Title Reduced pain by at least 50% for back and Rt thigh with all daily tasks.    Time 8    Period Weeks    Status New    Target Date 06/11/21  PT LONG TERM GOAL #5   Title Able to walk for exercise x at least 2 laps around block at home without exacerbation of pain    Time 8    Period Weeks    Status New    Target Date 06/11/21                   Plan - 05/06/21 1703     Clinical Impression Statement Patient continues to demonstrate bilateral quad weakness with noted trembling when performing lateral and forward step up activity. She reports overall decreased back and hip pain  this date. Glute strength improved as patient requiring decreased cuing when performing clamshell and sidelying hip abduction.    Personal Factors and Comorbidities Comorbidity 1;Comorbidity 2    Comorbidities osteopenia with multiple anterior wedge compression fractures, scoliosis    Examination-Activity Limitations Lift;Stand;Stairs;Sit;Bend;Locomotion Level    Examination-Participation Restrictions Meal Prep;Cleaning;Community Activity;Driving;Laundry;Yard Work    Rehab Potential Good    PT Frequency 2x / week    PT Duration 8 weeks    PT Treatment/Interventions ADLs/Self Care Home Management;Electrical Stimulation;Moist Heat;Cryotherapy;Neuromuscular re-education;Balance training;Therapeutic exercise;Therapeutic activities;Functional mobility training;Patient/family education;Manual techniques;Passive range of motion;Dry needling;Taping    PT Next Visit Plan continue lumbopelvic stab with ongoing postural cueing, continue hip strength    PT Home Exercise Plan Access Code: [email or web address] + spinal decompression (without leg lengthener due to pain with this)    Consulted and Agree with Plan of Care Patient             Patient will benefit from skilled therapeutic intervention in order to improve the following deficits and impairments:  Decreased range of motion, Increased muscle spasms, Postural dysfunction, Decreased strength, Decreased mobility, Impaired flexibility, Pain, Decreased balance, Decreased activity tolerance  Visit Diagnosis: Pain in right hip  Chronic right-sided low back pain without sciatica  Muscle weakness (generalized)  Abnormal posture     Problem List Patient Active Problem List   Diagnosis Date Noted   Degenerative disc disease, lumbar 03/27/2021   Right knee pain 07/29/2020   Hyperglycemia 04/01/2020   Lateral epicondylitis of right elbow 11/11/2018   Basal cell carcinoma (BCC) 01/10/2018   Gastroesophageal reflux disease 11/12/2017   Idiopathic scoliosis  05/14/2016   Alcohol abuse 05/14/2016   Rectovaginal fistula    C. difficile colitis    Crohn's disease ([place]) 05/27/2009   HEPATOMEGALY, HX OF 08/31/2008   Hypothyroidism 07/30/2008   Hypertriglyceridemia 07/30/2008   Anxiety 07/30/2008   Depression 07/30/2008   RENAL CYST 07/30/2008   DEGENERATIVE DISC DISEASE 07/30/2008   Osteopenia 07/30/2008   Everardo All PT, DPT  05/06/21 5:07 PM   [facility] [address], [place], [place], [address] Phone: [phone]   Fax:  [phone]  Name: Tammy Kennedy MRN: [id number] Date of Birth: Feb 20, 1956

## 2021-05-07 ENCOUNTER — Ambulatory Visit
Admission: RE | Admit: 2021-05-07 | Discharge: 2021-05-07 | Disposition: A | Discharge status: Home / Self Care | Payer: BC Managed Care – PPO | Source: Ambulatory Visit | Attending: Internal Medicine | Admitting: Internal Medicine

## 2021-05-07 DIAGNOSIS — Z1231 Encounter for screening mammogram for malignant neoplasm of breast: Secondary | ICD-10-CM | POA: Diagnosis not present

## 2021-05-08 ENCOUNTER — Other Ambulatory Visit: Payer: Self-pay

## 2021-05-08 ENCOUNTER — Ambulatory Visit: Payer: BC Managed Care – PPO | Admitting: Physical Therapy

## 2021-05-08 DIAGNOSIS — R293 Abnormal posture: Secondary | ICD-10-CM | POA: Diagnosis not present

## 2021-05-08 DIAGNOSIS — M545 Low back pain, unspecified: Secondary | ICD-10-CM | POA: Diagnosis not present

## 2021-05-08 DIAGNOSIS — G8929 Other chronic pain: Secondary | ICD-10-CM

## 2021-05-08 DIAGNOSIS — M25551 Pain in right hip: Secondary | ICD-10-CM

## 2021-05-08 DIAGNOSIS — M6281 Muscle weakness (generalized): Secondary | ICD-10-CM

## 2021-05-08 NOTE — Therapy (Signed)
[facility] [address] [place], [place], [address] Phone: [phone]   Fax:  [phone]  Physical Therapy Treatment  Patient Details  Name: Tammy Kennedy MRN: [id number] Date of Birth: 01-03-1956 Referring Provider (PT): Lyndal Pulley, DO   Encounter Date: 05/08/2021   PT End of Session - 05/08/21 1529     Visit Number 8    Date for PT Re-Evaluation 06/11/21    Authorization Type BCBS    PT Start Time 2831    PT Stop Time 1525    PT Time Calculation (min) 40 min    Activity Tolerance Patient tolerated treatment well;No increased pain    Behavior During Therapy [facility] for tasks assessed/performed             Past Medical History:  Diagnosis Date   Alcohol abuse 05/14/2016   ALCOHOL ABUSE, HX OF    ANEMIA    ANXIETY DISORDER    Basal cell carcinoma (BCC) 01/10/2018   C. difficile colitis    CROHN'S DISEASE    DEPRESSION    DRY EYE SYNDROME    HEPATOMEGALY, HX OF    HYPERLIPIDEMIA    intol of statin trials   HYPOTHYROIDISM    OSTEOPENIA    Osteoporosis 05/2017   states Pre osteoporosis 8/18   Rectovaginal fistula 2012   chron's disease   Tubular adenoma 2013    Past Surgical History:  Procedure Laterality Date   ANAL FISSURE REPAIR     CATARACT EXTRACTION     EYE SURGERY  11/2017   macular pucker   HEMORRHOID SURGERY     Left thumb surgery  1989   MOHS SURGERY  2008   Nose [facility]   Theraputic abortion      There were no vitals filed for this visit.   Subjective Assessment - 05/08/21 1447     Subjective Has no hip or back discomfort but does have dull, achy, Rt knee pain that is 4/10.    Pertinent History osteopenia - related to chronic steroid use for Chron's disease - has mutliple anterior wedge vertebrae: T12, L1, L3, L5    Limitations Sitting;Standing;House hold activities;Lifting    How long can you sit comfortably? 20-30 min, Rt LE    How long can you stand comfortably? 20-30  min (for back)    How long can you walk comfortably? hit or miss for Rt hip pain - is afraid to do too much walking    Diagnostic tests 03/2021 lumbar xray: severe lumbar degeneration, anterior wedging T12, L1, L3, L5, scoliosis, 03/2021 Rt knee xray negative    Patient Stated Goals pain relief, be able walk around the block or more for exercise (has housemate with cancer who needs to walk too)    Currently in Pain? Yes    Pain Score 1     Pain Location Back    Pain Orientation Mid;Lower    Pain Descriptors / Indicators Dull    Pain Type Chronic pain    Pain Onset More than a month ago    Multiple Pain Sites Yes    Pain Score 1    Pain Location Hip    Pain Orientation Right    Pain Descriptors / Indicators Dull    Pain Type Chronic pain                               [facility] Adult PT  Treatment/Exercise - 05/08/21 0001       Exercises   Exercises Lumbar;Knee/Hip;Shoulder      Lumbar Exercises: Stretches   Hip Flexor Stretch Right;Left;1 rep;20 seconds    Hip Flexor Stretch Limitations 1/2 kneel      Lumbar Exercises: Standing   Other Standing Lumbar Exercises anti rotation press, green tband, x15 Lt/Rt    Other Standing Lumbar Exercises resisted walking, 15#, x3 trips each direction      Lumbar Exercises: Supine   Bridge with Ball Squeeze 20 reps;3 seconds    Straight Leg Raise 15 reps      Lumbar Exercises: Quadruped   Plank kneeling plank at mat table; 3 x 30s hold      Knee/Hip Exercises: Standing   Lateral Step Up Left;Right;1 set;10 reps;Hand Hold: 1;Step Height: 6"    Forward Step Up Right;Left;1 set;10 reps;Hand Hold: 1;Step Height: 6"      Knee/Hip Exercises: Supine   Other Supine Knee/Hip Exercises LAQ with ball squeeze; x10 Rt      Knee/Hip Exercises: Sidelying   Hip ABduction Strengthening;AROM;Right;Left;1 set;15 reps    Hip ABduction Limitations cuing for eccentric control on lower    Clams x15 Lt/Rt; green loop; cuing for decreased pelvic  rotation                      PT Short Term Goals - 05/08/21 1515       PT SHORT TERM GOAL #1   Title ind with initial HEP    Time 4    Period Weeks    Status On-going      PT SHORT TERM GOAL #2   Title Pt will be able to demo improved SLS balance on each LE x 10 sec    Time 4    Period Weeks    Status Achieved   15s Lt/Rt   Target Date 05/14/21      PT SHORT TERM GOAL #3   Title Pt will report tolerance of 20-30 min of light standing housework without exacerbation of pain    Time 4    Period Weeks    Status On-going   20-25 minutes   Target Date 05/14/21      PT SHORT TERM GOAL #4   Title Pt will be able to sit x 30 min without exacerbation of pain    Time 4    Period Weeks    Status On-going   25s   Target Date 05/14/21               PT Long Term Goals - 04/16/21 1300       PT LONG TERM GOAL #1   Title Pt will be ind with advanced HEP and learn dos and don'ts for bone health given ostepenia/compression fracture history.    Time 8    Period Weeks    Status New    Target Date 06/11/21      PT LONG TERM GOAL #2   Title FOTO score improved by at least 10 points to 62%    Baseline 52%    Time 8    Period Weeks    Status New    Target Date 06/11/21      PT LONG TERM GOAL #3   Title Improved LE strength of at least 4+/5 with functional ability to perform stairs at work/home and SLS x 10 sec with good knee and hip stability.    Time 8    Period  Weeks    Status New    Target Date 06/11/21      PT LONG TERM GOAL #4   Title Reduced pain by at least 50% for back and Rt thigh with all daily tasks.    Time 8    Period Weeks    Status New    Target Date 06/11/21      PT LONG TERM GOAL #5   Title Able to walk for exercise x at least 2 laps around block at home without exacerbation of pain    Time 8    Period Weeks    Status New    Target Date 06/11/21                   Plan - 05/08/21 1526     Clinical Impression Statement  Patient reporting decreased Rt knee pain at start of session. Static balance improved as meeting STG for SLS. Activity tolerance improved as patient reporting that she is able to maintain sitting for approx. 25 minutes and performing standing activity for approx. 25 minutes without increased pain. She continues to reports minimal to no compliance with HEP.    Personal Factors and Comorbidities Comorbidity 1;Comorbidity 2    Comorbidities osteopenia with multiple anterior wedge compression fractures, scoliosis    Examination-Activity Limitations Lift;Stand;Stairs;Sit;Bend;Locomotion Level    Examination-Participation Restrictions Meal Prep;Cleaning;Community Activity;Driving;Laundry;Yard Work    Rehab Potential Good    PT Frequency 2x / week    PT Duration 8 weeks    PT Treatment/Interventions ADLs/Self Care Home Management;Electrical Stimulation;Moist Heat;Cryotherapy;Neuromuscular re-education;Balance training;Therapeutic exercise;Therapeutic activities;Functional mobility training;Patient/family education;Manual techniques;Passive range of motion;Dry needling;Taping    PT Next Visit Plan progress lumbopelvic stabilization to patient tolerance; continue hip and quad strengthening    PT Home Exercise Plan Access Code: [email or web address] + spinal decompression (without leg lengthener due to pain with this)    Consulted and Agree with Plan of Care Patient             Patient will benefit from skilled therapeutic intervention in order to improve the following deficits and impairments:  Decreased range of motion, Increased muscle spasms, Postural dysfunction, Decreased strength, Decreased mobility, Impaired flexibility, Pain, Decreased balance, Decreased activity tolerance  Visit Diagnosis: Pain in right hip  Chronic right-sided low back pain without sciatica  Muscle weakness (generalized)  Abnormal posture     Problem List Patient Active Problem List   Diagnosis Date Noted   Degenerative disc  disease, lumbar 03/27/2021   Right knee pain 07/29/2020   Hyperglycemia 04/01/2020   Lateral epicondylitis of right elbow 11/11/2018   Basal cell carcinoma (BCC) 01/10/2018   Gastroesophageal reflux disease 11/12/2017   Idiopathic scoliosis 05/14/2016   Alcohol abuse 05/14/2016   Rectovaginal fistula    C. difficile colitis    Crohn's disease ([place]) 05/27/2009   HEPATOMEGALY, HX OF 08/31/2008   Hypothyroidism 07/30/2008   Hypertriglyceridemia 07/30/2008   Anxiety 07/30/2008   Depression 07/30/2008   RENAL CYST 07/30/2008   DEGENERATIVE DISC DISEASE 07/30/2008   Osteopenia 07/30/2008   Everardo All PT, DPT  05/08/21 3:30 PM   [facility] [address] [place], [place], [address] Phone: [phone]   Fax:  [phone]  Name: Tammy Kennedy MRN: [id number] Date of Birth: Nov 06, 1955

## 2021-05-13 ENCOUNTER — Ambulatory Visit: Payer: Medicare Other | Attending: Family Medicine

## 2021-05-13 ENCOUNTER — Other Ambulatory Visit: Payer: Self-pay

## 2021-05-13 DIAGNOSIS — M6281 Muscle weakness (generalized): Secondary | ICD-10-CM | POA: Diagnosis not present

## 2021-05-13 DIAGNOSIS — M545 Low back pain, unspecified: Secondary | ICD-10-CM | POA: Insufficient documentation

## 2021-05-13 DIAGNOSIS — R293 Abnormal posture: Secondary | ICD-10-CM | POA: Insufficient documentation

## 2021-05-13 DIAGNOSIS — M25551 Pain in right hip: Secondary | ICD-10-CM | POA: Insufficient documentation

## 2021-05-13 DIAGNOSIS — G8929 Other chronic pain: Secondary | ICD-10-CM | POA: Insufficient documentation

## 2021-05-13 NOTE — Therapy (Signed)
[facility] [address], [place], [place], [address] Phone: [phone]   Fax:  [phone]  Physical Therapy Treatment  Patient Details  Name: Tammy Kennedy MRN: [id number] Date of Birth: [birth year] Referring Provider (PT): Lyndal Pulley, DO   Encounter Date: 05/13/2021   PT End of Session - 05/13/21 1021     Visit Number 9    Date for PT Re-Evaluation 06/11/21    Authorization Type BCBS    PT Start Time 0932    PT Stop Time 1016    PT Time Calculation (min) 44 min    Activity Tolerance Patient tolerated treatment well;No increased pain    Behavior During Therapy [facility] for tasks assessed/performed             Past Medical History:  Diagnosis Date   Alcohol abuse 05/14/2016   ALCOHOL ABUSE, HX OF    ANEMIA    ANXIETY DISORDER    Basal cell carcinoma (BCC) 01/10/2018   C. difficile colitis    CROHN'S DISEASE    DEPRESSION    DRY EYE SYNDROME    HEPATOMEGALY, HX OF    HYPERLIPIDEMIA    intol of statin trials   HYPOTHYROIDISM    OSTEOPENIA    Osteoporosis 05/2017   states Pre osteoporosis 8/18   Rectovaginal fistula 2012   chron's disease   Tubular adenoma 2013    Past Surgical History:  Procedure Laterality Date   ANAL FISSURE REPAIR     CATARACT EXTRACTION     EYE SURGERY  11/2017   macular pucker   HEMORRHOID SURGERY     Left thumb surgery  1989   MOHS SURGERY  2008   Nose [facility]   Theraputic abortion      There were no vitals filed for this visit.   Subjective Assessment - 05/13/21 0938     Subjective I am sore in my back and my Rt leg is more painful today.    Pertinent History osteopenia - related to chronic steroid use for Chron's disease - has mutliple anterior wedge vertebrae: T12, L1, L3, L5    Currently in Pain? Yes    Pain Score 2     Pain Location Back    Pain Orientation Left;Lower    Pain Descriptors / Indicators Dull    Pain Type Chronic pain    Pain Onset More than a  month ago    Pain Frequency Intermittent    Aggravating Factors  morning, lifting/standing in kitchen    Pain Relieving Factors sitting down, supine    Pain Score 4    Pain Location Leg    Pain Orientation Right    Pain Descriptors / Indicators Dull    Pain Type Chronic pain    Pain Onset More than a month ago    Pain Frequency Intermittent    Aggravating Factors  stairs    Pain Relieving Factors sittig down                               [facility] Adult PT Treatment/Exercise - 05/13/21 0001       Lumbar Exercises: Standing   Other Standing Lumbar Exercises anti rotation press, green tband, x15 Lt/Rt    Other Standing Lumbar Exercises resisted walking, 15#, x5  trips each direction      Lumbar Exercises: Supine   Ab Set 10 reps    Clam 20 reps  Clam Limitations green loop around thighs with ab bracing      Lumbar Exercises: Sidelying   Clam Right;15 reps      Knee/Hip Exercises: Stretches   Active Hamstring Stretch 2 reps;20 seconds    Piriformis Stretch Both;2 reps;20 seconds      Knee/Hip Exercises: Seated   Sit to Sand 2 sets;10 reps;without UE support   holding 5# kettle bell                Balance Exercises - 05/13/21 0001       Balance Exercises: Standing   Other Standing Exercises alternating taps on low cones: with side stepping in between, box step on colored discs on the floor. Close CGA for safety with this                 PT Short Term Goals - 05/08/21 1515       PT SHORT TERM GOAL #1   Title ind with initial HEP    Time 4    Period Weeks    Status On-going      PT SHORT TERM GOAL #2   Title Pt will be able to demo improved SLS balance on each LE x 10 sec    Time 4    Period Weeks    Status Achieved   15s Lt/Rt   Target Date 05/14/21      PT SHORT TERM GOAL #3   Title Pt will report tolerance of 20-30 min of light standing housework without exacerbation of pain    Time 4    Period Weeks    Status On-going    20-25 minutes   Target Date 05/14/21      PT SHORT TERM GOAL #4   Title Pt will be able to sit x 30 min without exacerbation of pain    Time 4    Period Weeks    Status On-going   25s   Target Date 05/14/21               PT Long Term Goals - 04/16/21 1300       PT LONG TERM GOAL #1   Title Pt will be ind with advanced HEP and learn dos and don'ts for bone health given ostepenia/compression fracture history.    Time 8    Period Weeks    Status New    Target Date 06/11/21      PT LONG TERM GOAL #2   Title FOTO score improved by at least 10 points to 62%    Baseline 52%    Time 8    Period Weeks    Status New    Target Date 06/11/21      PT LONG TERM GOAL #3   Title Improved LE strength of at least 4+/5 with functional ability to perform stairs at work/home and SLS x 10 sec with good knee and hip stability.    Time 8    Period Weeks    Status New    Target Date 06/11/21      PT LONG TERM GOAL #4   Title Reduced pain by at least 50% for back and Rt thigh with all daily tasks.    Time 8    Period Weeks    Status New    Target Date 06/11/21      PT LONG TERM GOAL #5   Title Able to walk for exercise x at least 2 laps around block at home without exacerbation  of pain    Time 8    Period Weeks    Status New    Target Date 06/11/21                   Plan - 05/13/21 1003     Clinical Impression Statement Pt reports significant improvement in balance and confidence.  Pt reports minimal compliance with HEP due to soreness.  PT encouraged to be more consistent with HEP.  Activity tolerance improved as patient reporting that she is able to maintain sitting for approx. 25 minutes and performing standing activity for approx. 25 minutes without increased pain. Pt requires verbal cueing and SBA for safety with standing balance tasks.  Pt will continue to benefit from skilled PT to address balance, strength and pain.    PT Frequency 2x / week    PT Duration 8 weeks     PT Treatment/Interventions ADLs/Self Care Home Management;Electrical Stimulation;Moist Heat;Cryotherapy;Neuromuscular re-education;Balance training;Therapeutic exercise;Therapeutic activities;Functional mobility training;Patient/family education;Manual techniques;Passive range of motion;Dry needling;Taping    PT Next Visit Plan progress lumbopelvic stabilization to patient tolerance; continue hip and quad strengthening    PT Home Exercise Plan Access Code: [email or web address] + spinal decompression (without leg lengthener due to pain with this)    Consulted and Agree with Plan of Care Patient             Patient will benefit from skilled therapeutic intervention in order to improve the following deficits and impairments:  Decreased range of motion, Increased muscle spasms, Postural dysfunction, Decreased strength, Decreased mobility, Impaired flexibility, Pain, Decreased balance, Decreased activity tolerance  Visit Diagnosis: Pain in right hip  Chronic right-sided low back pain without sciatica  Muscle weakness (generalized)  Abnormal posture     Problem List Patient Active Problem List   Diagnosis Date Noted   Degenerative disc disease, lumbar 03/27/2021   Right knee pain 07/29/2020   Hyperglycemia 04/01/2020   Lateral epicondylitis of right elbow 11/11/2018   Basal cell carcinoma (BCC) 01/10/2018   Gastroesophageal reflux disease 11/12/2017   Idiopathic scoliosis 05/14/2016   Alcohol abuse 05/14/2016   Rectovaginal fistula    C. difficile colitis    Crohn's disease ([place]) 05/27/2009   HEPATOMEGALY, HX OF 08/31/2008   Hypothyroidism 07/30/2008   Hypertriglyceridemia 07/30/2008   Anxiety 07/30/2008   Depression 07/30/2008   RENAL CYST 07/30/2008   DEGENERATIVE DISC DISEASE 07/30/2008   Osteopenia 07/30/2008    Sigurd Sos, PT 05/13/21 10:24 AM   [facility] [address], [place], [place], [address] Phone:  [phone]   Fax:  [phone]  Name: Tammy Kennedy MRN: [id number] Date of Birth: 1956/04/27

## 2021-05-14 ENCOUNTER — Other Ambulatory Visit: Payer: Self-pay | Admitting: Internal Medicine

## 2021-05-16 ENCOUNTER — Encounter: Payer: Self-pay | Admitting: Physical Therapy

## 2021-05-16 ENCOUNTER — Ambulatory Visit: Payer: Medicare Other | Admitting: Physical Therapy

## 2021-05-16 ENCOUNTER — Other Ambulatory Visit: Payer: Self-pay

## 2021-05-16 DIAGNOSIS — G8929 Other chronic pain: Secondary | ICD-10-CM

## 2021-05-16 DIAGNOSIS — R293 Abnormal posture: Secondary | ICD-10-CM

## 2021-05-16 DIAGNOSIS — M6281 Muscle weakness (generalized): Secondary | ICD-10-CM | POA: Diagnosis not present

## 2021-05-16 DIAGNOSIS — M25551 Pain in right hip: Secondary | ICD-10-CM

## 2021-05-16 DIAGNOSIS — M545 Low back pain, unspecified: Secondary | ICD-10-CM | POA: Diagnosis not present

## 2021-05-16 NOTE — Therapy (Signed)
[facility] [address], [place], [place], [address] Phone: [phone]   Fax:  [phone]  Physical Therapy Treatment  Patient Details  Name: Tammy Kennedy MRN: [id number] Date of Birth: [birth year] Referring Provider (PT): Lyndal Pulley, DO  Progress Note Reporting Period 04/16/21 to 05/16/21  See note below for Objective Data and Assessment of Progress/Goals.      Encounter Date: 05/16/2021   PT End of Session - 05/16/21 1151     Visit Number 10    Date for PT Re-Evaluation 06/11/21    Authorization Type BCBS - will be updating insurance soon to Medicare    Progress Note Due on Visit 20    PT Start Time 0930    PT Stop Time 1013    PT Time Calculation (min) 43 min    Activity Tolerance Patient tolerated treatment well;No increased pain    Behavior During Therapy [facility] for tasks assessed/performed             Past Medical History:  Diagnosis Date   Alcohol abuse 05/14/2016   ALCOHOL ABUSE, HX OF    ANEMIA    ANXIETY DISORDER    Basal cell carcinoma (BCC) 01/10/2018   C. difficile colitis    CROHN'S DISEASE    DEPRESSION    DRY EYE SYNDROME    HEPATOMEGALY, HX OF    HYPERLIPIDEMIA    intol of statin trials   HYPOTHYROIDISM    OSTEOPENIA    Osteoporosis 05/2017   states Pre osteoporosis 8/18   Rectovaginal fistula 2012   chron's disease   Tubular adenoma 2013    Past Surgical History:  Procedure Laterality Date   ANAL FISSURE REPAIR     CATARACT EXTRACTION     EYE SURGERY  11/2017   macular pucker   HEMORRHOID SURGERY     Left thumb surgery  1989   MOHS SURGERY  2008   Nose [facility]   Theraputic abortion      There were no vitals filed for this visit.   Subjective Assessment - 05/16/21 0931     Subjective I am still pretty sore in my Rt knee and back. I have had to stand a lot at work.  Overall I feel about 75% better with PT.    Pertinent History osteopenia - related to chronic  steroid use for Chron's disease - has mutliple anterior wedge vertebrae: T12, L1, L3, L5    Limitations Sitting;Standing;House hold activities;Lifting    How long can you sit comfortably? 20-30 min, Rt LE    How long can you stand comfortably? 20-30 min (for back)    How long can you walk comfortably? 30% better, I don't feel as tentative - I feel stronger    Diagnostic tests 03/2021 lumbar xray: severe lumbar degeneration, anterior wedging T12, L1, L3, L5, scoliosis, 03/2021 Rt knee xray negative    Patient Stated Goals pain relief, be able walk around the block or more for exercise (has housemate with cancer who needs to walk too)    Currently in Pain? Yes    Pain Score 3     Pain Location Back    Pain Descriptors / Indicators Dull    Pain Type Chronic pain    Pain Onset More than a month ago    Pain Frequency Intermittent    Aggravating Factors  standing    Multiple Pain Sites Yes    Pain Score 5    Pain Location Knee  Pain Orientation Lower;Right    Pain Descriptors / Indicators Aching;Sore    Pain Type Chronic pain    Pain Frequency Constant    Aggravating Factors  sitting                [facility] PT Assessment - 05/16/21 0001       Assessment   Medical Diagnosis M54.50 (ICD-10-CM) - Acute right-sided low back pain without sciatica  M25.50 (ICD-10-CM) - Polyarthralgia    Referring Provider (PT) Lyndal Pulley, DO    Onset Date/Surgical Date --   1 year for Rt leg, 2 months for LBP   Hand Dominance Right    Next MD Visit 2.5 weeks      Precautions   Precautions Back    Precaution Comments osteopenia - has anterior wedge fractures      Observation/Other Assessments   Focus on Therapeutic Outcomes (FOTO)  improved by 5% to 57%      Single Leg Stance   Comments 10 sec Rt/Lt w/o UE support      Posture/Postural Control   Posture/Postural Control Postural limitations    Posture Comments scoliosis Lt SB, Rt Rot, signif rib hump on Rt, depressed Rt shoulder girdle       AROM   Overall AROM Comments --      Strength   Overall Strength Comments knee strength 4+/5 bil, hip flexion and extension 4/5, abduction/ER/adduction 4+/5      Flexibility   Soft Tissue Assessment /Muscle Length --   Rt hip flexor end range limited   Hamstrings WNL    Quadriceps limited end range      Special Tests    Special Tests Hip Special Tests    Hip Special Tests  Saralyn Pilar (FABER) Test      Saralyn Pilar [facility]) Test   Findings Positive    Side Right                           [facility] Adult PT Treatment/Exercise - 05/16/21 0001       Knee/Hip Exercises: Stretches   Sports administrator Right;30 seconds    Quad Stretch Limitations standing    Hip Flexor Stretch Right;30 seconds;1 rep    Hip Flexor Stretch Limitations kneeling    Piriformis Stretch 1 rep;Right;60 seconds    Piriformis Stretch Limitations fig 4 supine end of session    Other Knee/Hip Stretches standing Rt adductor stretch, elbows on counter, straddle lunge 2x20"      Knee/Hip Exercises: Standing   Lateral Step Up Right;1 set;10 reps;Step Height: 2";Hand Hold: 0    Lateral Step Up Limitations mirror for valgus control    Step Down Left;1 set;10 reps;Step Height: 2";Hand Hold: 0    Step Down Limitations on Rt LE heel tap Lt with mirror for biofeedback of knee valgus control      Knee/Hip Exercises: Seated   Sit to Sand 1 set;10 reps;without UE support   stand on foam pad, VC for "spread feet and knees in transition", hold 5lb     Knee/Hip Exercises: Sidelying   Hip ABduction 2 sets;5 sets;5 reps    Hip ABduction Limitations 1x5 hip abd, 1x5 hip abd with ER    Clams x15 Lt/Rt; yellow loop; VC for TA co-contraction      Shoulder Exercises: Standing   Extension Strengthening;10 reps;Theraband;Both    Theraband Level (Shoulder Extension) Level 3 (Green)    Row Strengthening;Both;Theraband;10 reps    Theraband Level (Shoulder  Row) Level 3 (Green)    Row Limitations PT cued posture to avoid pelvic  tuck    Other Standing Exercises palloff press x 8 each way green band                      PT Short Term Goals - 05/16/21 0935       PT SHORT TERM GOAL #1   Title ind with initial HEP    Baseline not compliant    Status Not Met      PT SHORT TERM GOAL #2   Title Pt will be able to demo improved SLS balance on each LE x 10 sec    Status Achieved      PT SHORT TERM GOAL #3   Title Pt will report tolerance of 20-30 min of light standing housework without exacerbation of pain    Baseline can do but increased pain    Status Partially Met      PT SHORT TERM GOAL #4   Title Pt will be able to sit x 30 min without exacerbation of pain    Baseline pain on standing afterwards but can sit    Status Achieved               PT Long Term Goals - 05/16/21 0937       PT LONG TERM GOAL #1   Title Pt will be ind with advanced HEP and learn dos and don'ts for bone health given ostepenia/compression fracture history.    Status On-going      PT LONG TERM GOAL #2   Title FOTO score improved by at least 10 points to 62%    Baseline 57% 05/16/21    Status On-going      PT LONG TERM GOAL #3   Title Improved LE strength of at least 4+/5 with functional ability to perform stairs at work/home and SLS x 10 sec with good knee and hip stability.    Baseline met for SLS, hips 4/5 for ext and flexion, met for all other motions, knees 4+/5      PT LONG TERM GOAL #4   Title Reduced pain by at least 50% for back and Rt thigh with all daily tasks.    Status Achieved      PT LONG TERM GOAL #5   Title Able to walk for exercise x at least 2 laps around block at home without exacerbation of pain    Baseline hasn't tried    Status On-going                   Plan - 05/16/21 1059     Clinical Impression Statement Pt met most STGs and some LTGs.  She reports 75% improvement in overall function since starting PT.  Most notable improvement is in strength and balance confidence >  pain reduction.  She has not committed to walking for exercise and admits she has not been compliant with HEP.  Sitting and standing tolerance has improved to 30 min and FOTO score has improved by 5% with LTG of 10% improvement, demo'ing good progress toward LTG.  Her strength of bil hips and knees have improved to 4/5 to 4+/5.  She continues to have limited ER of Rt hip and LE flexibility restrictions along anterior and medial thigh.  Rt knee control in closed chain valgus angle is improving although Pt needs cueing for use of hip abductors and mirror to help with this.  She grows quickly fatigued with open chain Rt hip strength.  Rt thigh pain has become more localized to medial knee.  PT encouraged increased compliance with HEP to see if this helps further reduce her pain experience.    Comorbidities osteopenia with multiple anterior wedge compression fractures, scoliosis    PT Frequency 2x / week    PT Duration 8 weeks    PT Treatment/Interventions ADLs/Self Care Home Management;Electrical Stimulation;Moist Heat;Cryotherapy;Neuromuscular re-education;Balance training;Therapeutic exercise;Therapeutic activities;Functional mobility training;Patient/family education;Manual techniques;Passive range of motion;Dry needling;Taping    PT Next Visit Plan progress lumbopelvic stabilization to patient tolerance; Rt hip strength and knee valgus control for step downs, revisit Rt adductor straddle lunge stretch at counter, f/u on HEP compliance    PT Home Exercise Plan Access Code: [email or web address]    Consulted and Agree with Plan of Care Patient             Patient will benefit from skilled therapeutic intervention in order to improve the following deficits and impairments:     Visit Diagnosis: Pain in right hip  Chronic right-sided low back pain without sciatica  Muscle weakness (generalized)  Abnormal posture     Problem List Patient Active Problem List   Diagnosis Date Noted   Degenerative disc  disease, lumbar 03/27/2021   Right knee pain 07/29/2020   Hyperglycemia 04/01/2020   Lateral epicondylitis of right elbow 11/11/2018   Basal cell carcinoma (BCC) 01/10/2018   Gastroesophageal reflux disease 11/12/2017   Idiopathic scoliosis 05/14/2016   Alcohol abuse 05/14/2016   Rectovaginal fistula    C. difficile colitis    Crohn's disease ([place]) 05/27/2009   HEPATOMEGALY, HX OF 08/31/2008   Hypothyroidism 07/30/2008   Hypertriglyceridemia 07/30/2008   Anxiety 07/30/2008   Depression 07/30/2008   RENAL CYST 07/30/2008   DEGENERATIVE DISC DISEASE 07/30/2008   Osteopenia 07/30/2008   Jiro Kiester, PT 05/16/21 11:57 AM   [facility] [address], [place], [place], [address] Phone: [phone]   Fax:  [phone]  Name: Tammy Kennedy MRN: [id number] Date of Birth: 15-Apr-1956

## 2021-05-20 ENCOUNTER — Ambulatory Visit: Payer: Medicare Other | Admitting: Physical Therapy

## 2021-05-20 ENCOUNTER — Other Ambulatory Visit: Payer: Self-pay

## 2021-05-20 DIAGNOSIS — R293 Abnormal posture: Secondary | ICD-10-CM

## 2021-05-20 DIAGNOSIS — M545 Low back pain, unspecified: Secondary | ICD-10-CM

## 2021-05-20 DIAGNOSIS — M25551 Pain in right hip: Secondary | ICD-10-CM

## 2021-05-20 DIAGNOSIS — M6281 Muscle weakness (generalized): Secondary | ICD-10-CM

## 2021-05-20 DIAGNOSIS — G8929 Other chronic pain: Secondary | ICD-10-CM | POA: Diagnosis not present

## 2021-05-20 NOTE — Therapy (Signed)
[facility] [address], [place], [place], [address] Phone: [phone]   Fax:  [phone]  Physical Therapy Treatment  Patient Details  Name: Tammy Kennedy MRN: [id number] Date of Birth: 1955-11-20 Referring Provider (PT): Lyndal Pulley, DO   Encounter Date: 05/20/2021   PT End of Session - 05/20/21 1204     Visit Number 11    Date for PT Re-Evaluation 06/11/21    Authorization Type BCBS - will be updating insurance soon to Medicare    Progress Note Due on Visit 20    PT Start Time 0930    PT Stop Time 1009    PT Time Calculation (min) 39 min    Activity Tolerance Patient tolerated treatment well;No increased pain    Behavior During Therapy [facility] for tasks assessed/performed             Past Medical History:  Diagnosis Date   Alcohol abuse 05/14/2016   ALCOHOL ABUSE, HX OF    ANEMIA    ANXIETY DISORDER    Basal cell carcinoma (BCC) 01/10/2018   C. difficile colitis    CROHN'S DISEASE    DEPRESSION    DRY EYE SYNDROME    HEPATOMEGALY, HX OF    HYPERLIPIDEMIA    intol of statin trials   HYPOTHYROIDISM    OSTEOPENIA    Osteoporosis 05/2017   states Pre osteoporosis 8/18   Rectovaginal fistula 2012   chron's disease   Tubular adenoma 2013    Past Surgical History:  Procedure Laterality Date   ANAL FISSURE REPAIR     CATARACT EXTRACTION     EYE SURGERY  11/2017   macular pucker   HEMORRHOID SURGERY     Left thumb surgery  1989   MOHS SURGERY  2008   Nose [facility]   Theraputic abortion      There were no vitals filed for this visit.   Subjective Assessment - 05/20/21 0933     Subjective Patient reports bilateral low back, Rt hip, and Rt knee soreness this date.    Pertinent History osteopenia - related to chronic steroid use for Chron's disease - has mutliple anterior wedge vertebrae: T12, L1, L3, L5    Limitations Sitting;Standing;House hold activities;Lifting    How long can you sit  comfortably? 20-30 min, Rt LE    How long can you stand comfortably? 20-30 min (for back)    How long can you walk comfortably? 30% better, I don't feel as tentative - I feel stronger    Diagnostic tests 03/2021 lumbar xray: severe lumbar degeneration, anterior wedging T12, L1, L3, L5, scoliosis, 03/2021 Rt knee xray negative    Patient Stated Goals pain relief, be able walk around the block or more for exercise (has housemate with cancer who needs to walk too)    Currently in Pain? Yes    Pain Score 5     Pain Location Back    Pain Orientation Lower    Pain Descriptors / Indicators Sore    Pain Type Chronic pain    Multiple Pain Sites Yes    Pain Score 4    Pain Location Knee    Pain Orientation Right    Pain Descriptors / Indicators Sore    Pain Type Chronic pain                               [facility] Adult PT Treatment/Exercise -  05/20/21 0001       Lumbar Exercises: Stretches   Lower Trunk Rotation 5 reps;10 seconds    Hip Flexor Stretch Right;Left;1 rep;30 seconds    Hip Flexor Stretch Limitations 1/2 kneel    Figure 4 Stretch 1 rep;20 seconds;Supine;With overpressure      Lumbar Exercises: Seated   Long Arc Quad on Chair Right;Left;2 sets;10 reps    LAQ on Chair Limitations 1.5# with ball squeeze      Lumbar Exercises: Quadruped   Opposite Arm/Leg Raise Right arm/Left leg;Left arm/Right leg;10 reps;3 seconds    Opposite Arm/Leg Raise Limitations bent over high table      Knee/Hip Exercises: Standing   Hip Abduction Right;Left;1 set;10 reps;Knee bent    Abduction Limitations isometric ball push into wall    Step Down Right;Left;1 set;10 reps;Hand Hold: 1;Step Height: 2"    Step Down Limitations cuing for decreased valgus bilaterally      Knee/Hip Exercises: Sidelying   Hip ABduction Right;Left;2 sets;10 reps                      PT Short Term Goals - 05/16/21 0935       PT SHORT TERM GOAL #1   Title ind with initial HEP    Baseline  not compliant    Status Not Met      PT SHORT TERM GOAL #2   Title Pt will be able to demo improved SLS balance on each LE x 10 sec    Status Achieved      PT SHORT TERM GOAL #3   Title Pt will report tolerance of 20-30 min of light standing housework without exacerbation of pain    Baseline can do but increased pain    Status Partially Met      PT SHORT TERM GOAL #4   Title Pt will be able to sit x 30 min without exacerbation of pain    Baseline pain on standing afterwards but can sit    Status Achieved               PT Long Term Goals - 05/16/21 2330       PT LONG TERM GOAL #1   Title Pt will be ind with advanced HEP and learn dos and don'ts for bone health given ostepenia/compression fracture history.    Status On-going      PT LONG TERM GOAL #2   Title FOTO score improved by at least 10 points to 62%    Baseline 57% 05/16/21    Status On-going      PT LONG TERM GOAL #3   Title Improved LE strength of at least 4+/5 with functional ability to perform stairs at work/home and SLS x 10 sec with good knee and hip stability.    Baseline met for SLS, hips 4/5 for ext and flexion, met for all other motions, knees 4+/5      PT LONG TERM GOAL #4   Title Reduced pain by at least 50% for back and Rt thigh with all daily tasks.    Status Achieved      PT LONG TERM GOAL #5   Title Able to walk for exercise x at least 2 laps around block at home without exacerbation of pain    Baseline hasn't tried    Status On-going                   Plan - 05/20/21 1019  Clinical Impression Statement Patient continues to demonstrates bilateral eccentric quad weakness. Cuing provided for decreased pelvic rotation when performing standing 'bird dog" exercise. Therapist noting Lt hip drop when performing eccentric heel taps indicating need for continued glute med strengthening. Patient would benefit from continued skilled intervention for continued improvements with regards to  functional mobility and activity tolerance.    Personal Factors and Comorbidities Comorbidity 1;Comorbidity 2    Comorbidities osteopenia with multiple anterior wedge compression fractures, scoliosis    Examination-Activity Limitations Lift;Stand;Stairs;Sit;Bend;Locomotion Level    Examination-Participation Restrictions Meal Prep;Cleaning;Community Activity;Driving;Laundry;Yard Work    Rehab Potential Good    PT Frequency 2x / week    PT Duration 8 weeks    PT Treatment/Interventions ADLs/Self Care Home Management;Electrical Stimulation;Moist Heat;Cryotherapy;Neuromuscular re-education;Balance training;Therapeutic exercise;Therapeutic activities;Functional mobility training;Patient/family education;Manual techniques;Passive range of motion;Dry needling;Taping    PT Next Visit Plan continue progress lumbopelvic stabilization to patient tolerance; Rt hip strength and knee valgus control for step downs, revisit Rt adductor straddle lunge stretch at counter, f/u on HEP compliance    PT Home Exercise Plan Access Code: [email or web address]    Consulted and Agree with Plan of Care Patient             Patient will benefit from skilled therapeutic intervention in order to improve the following deficits and impairments:  Decreased range of motion, Increased muscle spasms, Postural dysfunction, Decreased strength, Decreased mobility, Impaired flexibility, Pain, Decreased balance, Decreased activity tolerance  Visit Diagnosis: Pain in right hip  Chronic right-sided low back pain without sciatica  Muscle weakness (generalized)  Abnormal posture     Problem List Patient Active Problem List   Diagnosis Date Noted   Degenerative disc disease, lumbar 03/27/2021   Right knee pain 07/29/2020   Hyperglycemia 04/01/2020   Lateral epicondylitis of right elbow 11/11/2018   Basal cell carcinoma (BCC) 01/10/2018   Gastroesophageal reflux disease 11/12/2017   Idiopathic scoliosis 05/14/2016   Alcohol abuse  05/14/2016   Rectovaginal fistula    C. difficile colitis    Crohn's disease ([place]) 05/27/2009   HEPATOMEGALY, HX OF 08/31/2008   Hypothyroidism 07/30/2008   Hypertriglyceridemia 07/30/2008   Anxiety 07/30/2008   Depression 07/30/2008   RENAL CYST 07/30/2008   DEGENERATIVE DISC DISEASE 07/30/2008   Osteopenia 07/30/2008   Everardo All PT, DPT  05/20/21 12:07 PM   [facility] [address], [place], [place], [address] Phone: [phone]   Fax:  [phone]  Name: Tammy Kennedy MRN: [id number] Date of Birth: Apr 02, 1956

## 2021-05-23 ENCOUNTER — Ambulatory Visit: Payer: Medicare Other | Admitting: Physical Therapy

## 2021-05-23 ENCOUNTER — Encounter: Payer: Self-pay | Admitting: Physical Therapy

## 2021-05-23 ENCOUNTER — Other Ambulatory Visit: Payer: Self-pay

## 2021-05-23 DIAGNOSIS — M6281 Muscle weakness (generalized): Secondary | ICD-10-CM | POA: Diagnosis not present

## 2021-05-23 DIAGNOSIS — M25551 Pain in right hip: Secondary | ICD-10-CM

## 2021-05-23 DIAGNOSIS — G8929 Other chronic pain: Secondary | ICD-10-CM

## 2021-05-23 DIAGNOSIS — M545 Low back pain, unspecified: Secondary | ICD-10-CM | POA: Diagnosis not present

## 2021-05-23 DIAGNOSIS — R293 Abnormal posture: Secondary | ICD-10-CM

## 2021-05-23 NOTE — Therapy (Signed)
[facility] [address] [place], [place], [address] Phone: [phone]   Fax:  [phone]  Physical Therapy Treatment  Patient Details  Name: Tammy Kennedy MRN: [id number] Date of Birth: [birth year] Referring Provider (PT): Lyndal Pulley, DO   Encounter Date: 05/23/2021   PT End of Session - 05/23/21 0931     Visit Number 12    Date for PT Re-Evaluation 06/11/21    Authorization Type BCBS - will be updating insurance soon to Medicare    Progress Note Due on Visit 20    PT Start Time 0931    PT Stop Time 1014    PT Time Calculation (min) 43 min    Activity Tolerance Patient tolerated treatment well;No increased pain    Behavior During Therapy [facility] for tasks assessed/performed             Past Medical History:  Diagnosis Date   Alcohol abuse 05/14/2016   ALCOHOL ABUSE, HX OF    ANEMIA    ANXIETY DISORDER    Basal cell carcinoma (BCC) 01/10/2018   C. difficile colitis    CROHN'S DISEASE    DEPRESSION    DRY EYE SYNDROME    HEPATOMEGALY, HX OF    HYPERLIPIDEMIA    intol of statin trials   HYPOTHYROIDISM    OSTEOPENIA    Osteoporosis 05/2017   states Pre osteoporosis 8/18   Rectovaginal fistula 2012   chron's disease   Tubular adenoma 2013    Past Surgical History:  Procedure Laterality Date   ANAL FISSURE REPAIR     CATARACT EXTRACTION     EYE SURGERY  11/2017   macular pucker   HEMORRHOID SURGERY     Left thumb surgery  1989   MOHS SURGERY  2008   Nose [facility]   Theraputic abortion      There were no vitals filed for this visit.   Subjective Assessment - 05/23/21 0933     Subjective I am better today than earlier in the week.    Pertinent History osteopenia - related to chronic steroid use for Chron's disease - has mutliple anterior wedge vertebrae: T12, L1, L3, L5    Limitations Sitting;Standing;House hold activities;Lifting    How long can you sit comfortably? 20-30 min, Rt LE     How long can you stand comfortably? 20-30 min (for back)    How long can you walk comfortably? 30% better, I don't feel as tentative - I feel stronger    Diagnostic tests 03/2021 lumbar xray: severe lumbar degeneration, anterior wedging T12, L1, L3, L5, scoliosis, 03/2021 Rt knee xray negative    Patient Stated Goals pain relief, be able walk around the block or more for exercise (has housemate with cancer who needs to walk too)    Currently in Pain? Yes    Pain Score 2     Pain Location Back    Pain Type Chronic pain    Pain Score 2    Pain Location Knee    Pain Orientation Right;Medial    Pain Type Chronic pain                               [facility] Adult PT Treatment/Exercise - 05/23/21 0001       Exercises   Exercises Shoulder;Knee/Hip;Lumbar      Lumbar Exercises: Stretches   Hip Flexor Stretch Right;Left;1 rep;30 seconds  Hip Flexor Stretch Limitations 1/2 kneel    Other Lumbar Stretch Exercise bil adductor standing straddle lunge stretch 2x15 sec      Lumbar Exercises: Aerobic   Stationary Bike L2 x 3' warm up, PT present to discuss pain levels      Lumbar Exercises: Sidelying   Clam Both;15 reps    Clam Limitations with TA re-focus every 5 reps    Hip Abduction Both;10 reps   able to perform 10 in sequence today     Knee/Hip Exercises: Stretches   Piriformis Stretch Both;1 rep;20 seconds    Other Knee/Hip Stretches fig 4 1x20, bil      Knee/Hip Exercises: Machines for Strengthening   Cybex Leg Press 1x20 40lb bil LEs with ball between knees   50lb was too heavy     Knee/Hip Exercises: Standing   Lateral Step Up Both;1 set;10 reps;Step Height: 4";Hand Hold: 1    Forward Step Up Both;1 set;10 reps;Hand Hold: 0;Step Height: 4"    Step Down Right;Left;1 set;10 reps;Hand Hold: 1;Step Height: 4"    Step Down Limitations improved valgus control today and able to return to stand from step down each rep      Knee/Hip Exercises: Seated   Sit to Sand 10  reps   slow motion stand to sit with yellow hip abd loop band     Knee/Hip Exercises: Supine   Bridges 10 reps    Bridges Limitations frog bridge      Shoulder Exercises: Standing   Other Standing Exercises palloff press green 1x15 each way      Shoulder Exercises: Power [employer] 10 reps    Row Limitations 20lb seated on ball                      PT Short Term Goals - 05/16/21 0935       PT SHORT TERM GOAL #1   Title ind with initial HEP    Baseline not compliant    Status Not Met      PT SHORT TERM GOAL #2   Title Pt will be able to demo improved SLS balance on each LE x 10 sec    Status Achieved      PT SHORT TERM GOAL #3   Title Pt will report tolerance of 20-30 min of light standing housework without exacerbation of pain    Baseline can do but increased pain    Status Partially Met      PT SHORT TERM GOAL #4   Title Pt will be able to sit x 30 min without exacerbation of pain    Baseline pain on standing afterwards but can sit    Status Achieved               PT Long Term Goals - 05/16/21 0937       PT LONG TERM GOAL #1   Title Pt will be ind with advanced HEP and learn dos and don'ts for bone health given ostepenia/compression fracture history.    Status On-going      PT LONG TERM GOAL #2   Title FOTO score improved by at least 10 points to 62%    Baseline 57% 05/16/21    Status On-going      PT LONG TERM GOAL #3   Title Improved LE strength of at least 4+/5 with functional ability to perform stairs at work/home and SLS x 10 sec with good knee and hip stability.  Baseline met for SLS, hips 4/5 for ext and flexion, met for all other motions, knees 4+/5      PT LONG TERM GOAL #4   Title Reduced pain by at least 50% for back and Rt thigh with all daily tasks.    Status Achieved      PT LONG TERM GOAL #5   Title Able to walk for exercise x at least 2 laps around block at home without exacerbation of pain    Baseline hasn't tried     Status On-going                   Plan - 05/23/21 1012     Clinical Impression Statement Pt arrived with lower pain levels than prior visits, 2/10 in both back and Rt knee.  She was able to progress all step ups and downs to 4" steps today with demo of much improved knee valgus control.  PT introduced recumbent bike and leg press today to continue working on quad and glut strength.  Pt continues to need to refocus on deep abdominals within sets of exercises for maintained central control.  She is working on eccentric control with both leg press, step downs, stand to sit and row. Continue along POC.    Comorbidities osteopenia with multiple anterior wedge compression fractures, scoliosis    PT Next Visit Plan continue progress lumbopelvic stabilization to patient tolerance; Rt hip strength and knee valgus control for step downs, revisit Rt adductor straddle lunge stretch at counter, f/u on HEP compliance    PT Home Exercise Plan Access Code: [email or web address]    Consulted and Agree with Plan of Care Patient             Patient will benefit from skilled therapeutic intervention in order to improve the following deficits and impairments:     Visit Diagnosis: Pain in right hip  Chronic right-sided low back pain without sciatica  Muscle weakness (generalized)  Abnormal posture     Problem List Patient Active Problem List   Diagnosis Date Noted   Degenerative disc disease, lumbar 03/27/2021   Right knee pain 07/29/2020   Hyperglycemia 04/01/2020   Lateral epicondylitis of right elbow 11/11/2018   Basal cell carcinoma (BCC) 01/10/2018   Gastroesophageal reflux disease 11/12/2017   Idiopathic scoliosis 05/14/2016   Alcohol abuse 05/14/2016   Rectovaginal fistula    C. difficile colitis    Crohn's disease ([place]) 05/27/2009   HEPATOMEGALY, HX OF 08/31/2008   Hypothyroidism 07/30/2008   Hypertriglyceridemia 07/30/2008   Anxiety 07/30/2008   Depression 07/30/2008   RENAL CYST  07/30/2008   DEGENERATIVE DISC DISEASE 07/30/2008   Osteopenia 07/30/2008    Aliene Tamura, PT 05/23/21 10:15 AM    [facility] [address], [place], [place], [address] Phone: [phone]   Fax:  [phone]  Name: MARGIA WIESEN MRN: [id number] Date of Birth: [birth year]

## 2021-05-27 ENCOUNTER — Other Ambulatory Visit: Payer: Self-pay

## 2021-05-27 ENCOUNTER — Ambulatory Visit: Payer: Medicare Other | Admitting: Physical Therapy

## 2021-05-27 DIAGNOSIS — M25551 Pain in right hip: Secondary | ICD-10-CM | POA: Diagnosis not present

## 2021-05-27 DIAGNOSIS — M545 Low back pain, unspecified: Secondary | ICD-10-CM | POA: Diagnosis not present

## 2021-05-27 DIAGNOSIS — G8929 Other chronic pain: Secondary | ICD-10-CM | POA: Diagnosis not present

## 2021-05-27 DIAGNOSIS — M6281 Muscle weakness (generalized): Secondary | ICD-10-CM

## 2021-05-27 DIAGNOSIS — R293 Abnormal posture: Secondary | ICD-10-CM | POA: Diagnosis not present

## 2021-05-27 NOTE — Therapy (Signed)
[facility] [address] [place], [place], [address] Phone: [phone]   Fax:  [phone]  Physical Therapy Treatment  Patient Details  Name: Tammy Kennedy MRN: [id number] Date of Birth: 30-Mar-1956 Referring Provider (PT): Lyndal Pulley, DO   Encounter Date: 05/27/2021   PT End of Session - 05/27/21 1709     Visit Number 13    Date for PT Re-Evaluation 06/11/21    Authorization Type BCBS - will be updating insurance soon to Medicare    PT Start Time 0931    PT Stop Time 1011    PT Time Calculation (min) 40 min    Activity Tolerance Patient tolerated treatment well;No increased pain    Behavior During Therapy [facility] for tasks assessed/performed             Past Medical History:  Diagnosis Date   Alcohol abuse 05/14/2016   ALCOHOL ABUSE, HX OF    ANEMIA    ANXIETY DISORDER    Basal cell carcinoma (BCC) 01/10/2018   C. difficile colitis    CROHN'S DISEASE    DEPRESSION    DRY EYE SYNDROME    HEPATOMEGALY, HX OF    HYPERLIPIDEMIA    intol of statin trials   HYPOTHYROIDISM    OSTEOPENIA    Osteoporosis 05/2017   states Pre osteoporosis 8/18   Rectovaginal fistula 2012   chron's disease   Tubular adenoma 2013    Past Surgical History:  Procedure Laterality Date   ANAL FISSURE REPAIR     CATARACT EXTRACTION     EYE SURGERY  11/2017   macular pucker   HEMORRHOID SURGERY     Left thumb surgery  1989   MOHS SURGERY  2008   Nose [facility]   Theraputic abortion      There were no vitals filed for this visit.   Subjective Assessment - 05/27/21 0936     Subjective Feels tired today. Has no knee pain.    Pertinent History osteopenia - related to chronic steroid use for Crohn's disease - has mutliple anterior wedge vertebrae: T12, L1, L3, L5    Limitations Sitting;Standing;House hold activities;Lifting    How long can you sit comfortably? 20-30 min, Rt LE    How long can you stand comfortably? 20-30  min (for back)    How long can you walk comfortably? 30% better, I don't feel as tentative - I feel stronger    Diagnostic tests 03/2021 lumbar xray: severe lumbar degeneration, anterior wedging T12, L1, L3, L5, scoliosis, 03/2021 Rt knee xray negative    Patient Stated Goals pain relief, be able walk around the block or more for exercise (has housemate with cancer who needs to walk too)    Currently in Pain? Yes    Pain Score 2     Pain Location Back    Pain Orientation Lower    Pain Descriptors / Indicators Sore    Pain Score 0    Pain Location Knee                               [facility] Adult PT Treatment/Exercise - 05/27/21 0001       Exercises   Exercises Shoulder;Knee/Hip;Lumbar      Lumbar Exercises: Stretches   Hip Flexor Stretch Right;Left;1 rep;30 seconds    Hip Flexor Stretch Limitations 1/2 kneel    Other Lumbar Stretch Exercise --  Lumbar Exercises: Aerobic   Stationary Bike --      Lumbar Exercises: Sidelying   Clam Both;15 reps    Clam Limitations --    Hip Abduction Both;10 reps      Knee/Hip Exercises: Stretches   Piriformis Stretch Both;1 rep;20 seconds    Other Knee/Hip Stretches fig 4 1x20, bil      Knee/Hip Exercises: Machines for Strengthening   Cybex Leg Press 40# x 10 repetitions; bil LE   50lb was too heavy     Knee/Hip Exercises: Standing   Lateral Step Up Both;1 set;10 reps;Step Height: 4";Hand Hold: 1    Forward Step Up Both;1 set;10 reps;Hand Hold: 0;Step Height: 4"    Step Down Right;Left;1 set;10 reps;Hand Hold: 1;Step Height: 4"    Step Down Limitations --      Knee/Hip Exercises: Seated   Sit to Sand 10 reps   cuing for eccentric control     Knee/Hip Exercises: Supine   Bridges 10 reps    Bridges Limitations frog bridge      Shoulder Exercises: Standing   Other Standing Exercises palloff press green 1x15 each way      Shoulder Exercises: Power [employer] 10 reps    Row Limitations 20lb seated on ball                       PT Short Term Goals - 05/16/21 0935       PT SHORT TERM GOAL #1   Title ind with initial HEP    Baseline not compliant    Status Not Met      PT SHORT TERM GOAL #2   Title Pt will be able to demo improved SLS balance on each LE x 10 sec    Status Achieved      PT SHORT TERM GOAL #3   Title Pt will report tolerance of 20-30 min of light standing housework without exacerbation of pain    Baseline can do but increased pain    Status Partially Met      PT SHORT TERM GOAL #4   Title Pt will be able to sit x 30 min without exacerbation of pain    Baseline pain on standing afterwards but can sit    Status Achieved               PT Long Term Goals - 05/27/21 1708       PT LONG TERM GOAL #1   Title Pt will be ind with advanced HEP and learn dos and don'ts for bone health given ostepenia/compression fracture history.    Time 8    Period Weeks    Status On-going      PT LONG TERM GOAL #2   Title FOTO score improved by at least 10 points to 62%    Baseline 57% 05/16/21    Time 8    Period Weeks    Status On-going      PT LONG TERM GOAL #3   Title Improved LE strength of at least 4+/5 with functional ability to perform stairs at work/home and SLS x 10 sec with good knee and hip stability.    Baseline met for SLS, hips 4/5 for ext and flexion, met for all other motions, knees 4+/5    Time 8    Period Weeks    Status Partially Met      PT LONG TERM GOAL #4   Title Reduced pain by  at least 50% for back and Rt thigh with all daily tasks.    Time 8    Period Weeks    Status Achieved      PT LONG TERM GOAL #5   Title Able to walk for exercise x at least 2 laps around block at home without exacerbation of pain    Baseline hasn't tried    Time 8    Period Weeks    Status --   has not tried                  Plan - 05/27/21 1706     Clinical Impression Statement Patient having increased difficulty with eccentric control when  performing leg press exercise indicating continued quad strength impairments. Also having increased difficulty when attempting clamshell exercise. Would benefit from continued progression along current POC.    Personal Factors and Comorbidities Comorbidity 1;Comorbidity 2    Comorbidities osteopenia with multiple anterior wedge compression fractures, scoliosis    Examination-Activity Limitations Lift;Stand;Stairs;Sit;Bend;Locomotion Level    Examination-Participation Restrictions Meal Prep;Cleaning;Community Activity;Driving;Laundry;Yard Work    Rehab Potential Good    PT Frequency 2x / week    PT Duration 8 weeks    PT Treatment/Interventions ADLs/Self Care Home Management;Electrical Stimulation;Moist Heat;Cryotherapy;Neuromuscular re-education;Balance training;Therapeutic exercise;Therapeutic activities;Functional mobility training;Patient/family education;Manual techniques;Passive range of motion;Dry needling;Taping    PT Next Visit Plan continue progress lumbopelvic stabilization to patient tolerance; Rt hip strength and knee valgus control for step downs, revisit Rt adductor straddle lunge stretch at counter, f/u on HEP compliance    PT Home Exercise Plan Access Code: [email or web address]    Consulted and Agree with Plan of Care Patient             Patient will benefit from skilled therapeutic intervention in order to improve the following deficits and impairments:  Decreased range of motion, Increased muscle spasms, Postural dysfunction, Decreased strength, Decreased mobility, Impaired flexibility, Pain, Decreased balance, Decreased activity tolerance  Visit Diagnosis: Pain in right hip  Chronic right-sided low back pain without sciatica  Muscle weakness (generalized)  Abnormal posture     Problem List Patient Active Problem List   Diagnosis Date Noted   Degenerative disc disease, lumbar 03/27/2021   Right knee pain 07/29/2020   Hyperglycemia 04/01/2020   Lateral epicondylitis of  right elbow 11/11/2018   Basal cell carcinoma (BCC) 01/10/2018   Gastroesophageal reflux disease 11/12/2017   Idiopathic scoliosis 05/14/2016   Alcohol abuse 05/14/2016   Rectovaginal fistula    C. difficile colitis    Crohn's disease ([place]) 05/27/2009   HEPATOMEGALY, HX OF 08/31/2008   Hypothyroidism 07/30/2008   Hypertriglyceridemia 07/30/2008   Anxiety 07/30/2008   Depression 07/30/2008   RENAL CYST 07/30/2008   DEGENERATIVE DISC DISEASE 07/30/2008   Osteopenia 07/30/2008   Everardo All PT, DPT 05/27/21 5:10 PM   [facility] [address] [place], [place], [address] Phone: [phone]   Fax:  [phone]  Name: Tammy Kennedy MRN: [id number] Date of Birth: 27-Jun-1956

## 2021-05-29 ENCOUNTER — Other Ambulatory Visit: Payer: Self-pay

## 2021-05-29 ENCOUNTER — Ambulatory Visit: Payer: Medicare Other | Admitting: Physical Therapy

## 2021-05-29 DIAGNOSIS — M25551 Pain in right hip: Secondary | ICD-10-CM

## 2021-05-29 DIAGNOSIS — R293 Abnormal posture: Secondary | ICD-10-CM | POA: Diagnosis not present

## 2021-05-29 DIAGNOSIS — M545 Low back pain, unspecified: Secondary | ICD-10-CM

## 2021-05-29 DIAGNOSIS — M6281 Muscle weakness (generalized): Secondary | ICD-10-CM | POA: Diagnosis not present

## 2021-05-29 DIAGNOSIS — G8929 Other chronic pain: Secondary | ICD-10-CM

## 2021-05-29 NOTE — Therapy (Signed)
[facility] [address] [place], [place], [address] Phone: [phone]   Fax:  [phone]  Physical Therapy Treatment  Patient Details  Name: Tammy Kennedy MRN: [id number] Date of Birth: 1955-10-20 Referring Provider (PT): Lyndal Pulley, DO   Encounter Date: 05/29/2021   PT End of Session - 05/29/21 1500     Visit Number 14    Date for PT Re-Evaluation 06/11/21    Authorization Type BCBS - will be updating insurance soon to Medicare    Progress Note Due on Visit 20    PT Start Time 1019    PT Stop Time 1057    PT Time Calculation (min) 38 min    Activity Tolerance Patient tolerated treatment well;No increased pain    Behavior During Therapy [facility] for tasks assessed/performed             Past Medical History:  Diagnosis Date   Alcohol abuse 05/14/2016   ALCOHOL ABUSE, HX OF    ANEMIA    ANXIETY DISORDER    Basal cell carcinoma (BCC) 01/10/2018   C. difficile colitis    CROHN'S DISEASE    DEPRESSION    DRY EYE SYNDROME    HEPATOMEGALY, HX OF    HYPERLIPIDEMIA    intol of statin trials   HYPOTHYROIDISM    OSTEOPENIA    Osteoporosis 05/2017   states Pre osteoporosis 8/18   Rectovaginal fistula 2012   chron's disease   Tubular adenoma 2013    Past Surgical History:  Procedure Laterality Date   ANAL FISSURE REPAIR     CATARACT EXTRACTION     EYE SURGERY  11/2017   macular pucker   HEMORRHOID SURGERY     Left thumb surgery  1989   MOHS SURGERY  2008   Nose [facility]   Theraputic abortion      There were no vitals filed for this visit.   Subjective Assessment - 05/29/21 1022     Subjective "I'm a little stiff"    Pertinent History osteopenia - related to chronic steroid use for Crohn's disease - has mutliple anterior wedge vertebrae: T12, L1, L3, L5    Limitations Sitting;Standing;House hold activities;Lifting    How long can you sit comfortably? 20-30 min, Rt LE    How long can you stand  comfortably? 20-30 min (for back)    How long can you walk comfortably? 30% better, I don't feel as tentative - I feel stronger    Diagnostic tests 03/2021 lumbar xray: severe lumbar degeneration, anterior wedging T12, L1, L3, L5, scoliosis, 03/2021 Rt knee xray negative    Patient Stated Goals pain relief, be able walk around the block or more for exercise (has housemate with cancer who needs to walk too)    Currently in Pain? No/denies    Pain Score 1    Pain Location Knee    Pain Orientation Right    Pain Descriptors / Indicators Discomfort    Pain Type Chronic pain                [facility] PT Assessment - 05/29/21 0001       Strength   Overall Strength Comments bil hip extension and flexion 4+/5                           [facility] Adult PT Treatment/Exercise - 05/29/21 0001       Exercises   Exercises Shoulder;Knee/Hip;Lumbar  Lumbar Exercises: Stretches   Other Lumbar Stretch Exercise bil adductor standing straddle lunge stretch 2x15 sec      Lumbar Exercises: Sidelying   Clam Right;Left;10 reps    Clam Limitations green loop    Hip Abduction Both;15 reps      Knee/Hip Exercises: Machines for Strengthening   Cybex Leg Press 40#; 2 x 10 repetitions   50lb was too heavy     Knee/Hip Exercises: Standing   Lateral Step Up Both;1 set;10 reps;Step Height: 4";Hand Hold: 1    Forward Step Up Both;1 set;10 reps;Hand Hold: 0;Step Height: 4"    Step Down Right;Left;1 set;10 reps;Hand Hold: 1;Step Height: 4"      Knee/Hip Exercises: Seated   Sit to Sand 10 reps   yellow band at knees     Knee/Hip Exercises: Supine   Bridges 10 reps    Bridges Limitations frog bridge      Shoulder Exercises: Standing   Other Standing Exercises palloff press green 1x15 each way      Shoulder Exercises: Power [employer] 10 reps    Row Limitations 20lb seated on ball                      PT Short Term Goals - 05/29/21 1025       PT SHORT TERM GOAL #1    Title ind with initial HEP    Baseline not compliant    Time 4    Period Weeks    Status Achieved   independent but not compliant   Target Date 05/14/21      PT SHORT TERM GOAL #2   Title Pt will be able to demo improved SLS balance on each LE x 10 sec    Time 4    Period Weeks    Status Achieved    Target Date 05/14/21      PT SHORT TERM GOAL #3   Title Pt will report tolerance of 20-30 min of light standing housework without exacerbation of pain    Baseline can do but increased pain    Time 4    Period Weeks    Status On-going   can walk through a store for 30 minutes without pain but able to perform housework for only 10 minutes before back pain   Target Date 05/14/21      PT SHORT TERM GOAL #4   Title Pt will be able to sit x 30 min without exacerbation of pain    Baseline pain on standing afterwards but can sit    Time 4    Period Weeks    Status Achieved    Target Date 05/14/21               PT Long Term Goals - 05/29/21 1027       PT LONG TERM GOAL #1   Title Pt will be ind with advanced HEP and learn dos and don'ts for bone health given ostepenia/compression fracture history.    Time 8    Period Weeks    Status On-going      PT LONG TERM GOAL #3   Title Improved LE strength of at least 4+/5 with functional ability to perform stairs at work/home and SLS x 10 sec with good knee and hip stability.    Baseline met for SLS, hips 4/5 for ext and flexion, met for all other motions, knees 4+/5    Time 8    Period  Weeks    Status Achieved                   Plan - 05/29/21 1455     Clinical Impression Statement Patient demos improved bil LE strength as bilateral hip extension and hip flexion 4+/5. Has not attempted increasing ambulation distance. Standing tolerance somewhat improved but decreases with addiditonal activity.    Personal Factors and Comorbidities Comorbidity 1;Comorbidity 2    Comorbidities osteopenia with multiple anterior wedge  compression fractures, scoliosis    Examination-Activity Limitations Lift;Stand;Stairs;Sit;Bend;Locomotion Level    Examination-Participation Restrictions Meal Prep;Cleaning;Community Activity;Driving;Laundry;Yard Work    Rehab Potential Good    PT Frequency 2x / week    PT Duration 8 weeks    PT Treatment/Interventions ADLs/Self Care Home Management;Electrical Stimulation;Moist Heat;Cryotherapy;Neuromuscular re-education;Balance training;Therapeutic exercise;Therapeutic activities;Functional mobility training;Patient/family education;Manual techniques;Passive range of motion;Dry needling;Taping    PT Next Visit Plan continue progress lumbopelvic stabilization to patient tolerance; Rt hip strength and knee valgus control for step downs, revisit Rt adductor straddle lunge stretch at counter, f/u on HEP compliance    PT Home Exercise Plan Access Code: [email or web address]    Consulted and Agree with Plan of Care Patient             Patient will benefit from skilled therapeutic intervention in order to improve the following deficits and impairments:  Decreased range of motion, Increased muscle spasms, Postural dysfunction, Decreased strength, Decreased mobility, Impaired flexibility, Pain, Decreased balance, Decreased activity tolerance  Visit Diagnosis: Pain in right hip  Muscle weakness (generalized)  Abnormal posture  Chronic right-sided low back pain without sciatica     Problem List Patient Active Problem List   Diagnosis Date Noted   Degenerative disc disease, lumbar 03/27/2021   Right knee pain 07/29/2020   Hyperglycemia 04/01/2020   Lateral epicondylitis of right elbow 11/11/2018   Basal cell carcinoma (BCC) 01/10/2018   Gastroesophageal reflux disease 11/12/2017   Idiopathic scoliosis 05/14/2016   Alcohol abuse 05/14/2016   Rectovaginal fistula    C. difficile colitis    Crohn's disease ([place]) 05/27/2009   HEPATOMEGALY, HX OF 08/31/2008   Hypothyroidism 07/30/2008    Hypertriglyceridemia 07/30/2008   Anxiety 07/30/2008   Depression 07/30/2008   RENAL CYST 07/30/2008   DEGENERATIVE DISC DISEASE 07/30/2008   Osteopenia 07/30/2008   Everardo All PT, DPT  05/29/21 3:04 PM   [facility] [address] [place], [place], [address] Phone: [phone]   Fax:  [phone]  Name: Tammy Kennedy MRN: [id number] Date of Birth: March 14, 1956

## 2021-06-03 ENCOUNTER — Other Ambulatory Visit: Payer: Self-pay

## 2021-06-03 ENCOUNTER — Ambulatory Visit: Payer: Medicare Other | Admitting: Physical Therapy

## 2021-06-03 DIAGNOSIS — M25551 Pain in right hip: Secondary | ICD-10-CM

## 2021-06-03 DIAGNOSIS — M6281 Muscle weakness (generalized): Secondary | ICD-10-CM

## 2021-06-03 DIAGNOSIS — R293 Abnormal posture: Secondary | ICD-10-CM

## 2021-06-03 DIAGNOSIS — G8929 Other chronic pain: Secondary | ICD-10-CM

## 2021-06-03 DIAGNOSIS — M545 Low back pain, unspecified: Secondary | ICD-10-CM | POA: Diagnosis not present

## 2021-06-03 NOTE — Therapy (Signed)
[facility] [address], [place], [place], [address] Phone: [phone]   Fax:  [phone]  Physical Therapy Treatment  Patient Details  Name: Tammy Kennedy MRN: [id number] Date of Birth: Feb 11, 1956 Referring Provider (PT): Lyndal Pulley, DO   Encounter Date: 06/03/2021   PT End of Session - 06/03/21 1443     Visit Number 15    Date for PT Re-Evaluation 06/11/21    Authorization Type BCBS - will be updating insurance soon to Medicare    Progress Note Due on Visit 20    PT Start Time 1400    PT Stop Time 1439    PT Time Calculation (min) 39 min    Activity Tolerance Patient tolerated treatment well;No increased pain    Behavior During Therapy [facility] for tasks assessed/performed             Past Medical History:  Diagnosis Date   Alcohol abuse 05/14/2016   ALCOHOL ABUSE, HX OF    ANEMIA    ANXIETY DISORDER    Basal cell carcinoma (BCC) 01/10/2018   C. difficile colitis    CROHN'S DISEASE    DEPRESSION    DRY EYE SYNDROME    HEPATOMEGALY, HX OF    HYPERLIPIDEMIA    intol of statin trials   HYPOTHYROIDISM    OSTEOPENIA    Osteoporosis 05/2017   states Pre osteoporosis 8/18   Rectovaginal fistula 2012   chron's disease   Tubular adenoma 2013    Past Surgical History:  Procedure Laterality Date   ANAL FISSURE REPAIR     CATARACT EXTRACTION     EYE SURGERY  11/2017   macular pucker   HEMORRHOID SURGERY     Left thumb surgery  1989   MOHS SURGERY  2008   Nose [facility]   Theraputic abortion      There were no vitals filed for this visit.   Subjective Assessment - 06/03/21 1440     Subjective Has lumbar stiffness. Would like to address this.    Pertinent History osteopenia - related to chronic steroid use for Crohn's disease - has mutliple anterior wedge vertebrae: T12, L1, L3, L5    Limitations Sitting;Standing;House hold activities;Lifting    How long can you sit comfortably? 20-30 min, Rt LE     How long can you stand comfortably? 20-30 min (for back)    How long can you walk comfortably? 30% better, I don't feel as tentative - I feel stronger    Diagnostic tests 03/2021 lumbar xray: severe lumbar degeneration, anterior wedging T12, L1, L3, L5, scoliosis, 03/2021 Rt knee xray negative    Patient Stated Goals pain relief, be able walk around the block or more for exercise (has housemate with cancer who needs to walk too)    Currently in Pain? No/denies                               [facility] Adult PT Treatment/Exercise - 06/03/21 0001       Exercises   Exercises Shoulder;Knee/Hip;Lumbar      Lumbar Exercises: Stretches   Hip Flexor Stretch Right;Left;1 rep;30 seconds    Other Lumbar Stretch Exercise bil adductor standing straddle lunge stretch 2x15 sec    Other Lumbar Stretch Exercise standing prayer stretch x30 sec      Lumbar Exercises: Standing   Other Standing Lumbar Exercises chops; green tband; x15 Lt/Rt  Lumbar Exercises: Sidelying   Clam Right;Left;10 reps    Clam Limitations green loop    Hip Abduction Both;15 reps      Knee/Hip Exercises: Machines for Strengthening   Cybex Leg Press 45#; 2 x 10 repetitions   50lb was too heavy     Knee/Hip Exercises: Standing   Lateral Step Up Both;Step Height: 4";Hand Hold: 1    Lateral Step Up Limitations x12 repetitions    Forward Step Up Both;Hand Hold: 0;Step Height: 4"    Forward Step Up Limitations x12 repetitions    Step Down Hand Hold: 1;Step Height: 4";Both    Step Down Limitations x12 repetitions      Knee/Hip Exercises: Seated   Sit to Sand --   yellow band at knees x 12 repetitions     Knee/Hip Exercises: Supine   Bridges 15 reps    Bridges Limitations frog bridge      Knee/Hip Exercises: Sidelying   Hip ABduction Both;2 sets;10 reps    Clams 2 x 12 repetitions; green loop; Lt/Rt                      PT Short Term Goals - 05/29/21 1025       PT SHORT TERM GOAL #1    Title ind with initial HEP    Baseline not compliant    Time 4    Period Weeks    Status Achieved   independent but not compliant   Target Date 05/14/21      PT SHORT TERM GOAL #2   Title Pt will be able to demo improved SLS balance on each LE x 10 sec    Time 4    Period Weeks    Status Achieved    Target Date 05/14/21      PT SHORT TERM GOAL #3   Title Pt will report tolerance of 20-30 min of light standing housework without exacerbation of pain    Baseline can do but increased pain    Time 4    Period Weeks    Status On-going   can walk through a store for 30 minutes without pain but able to perform housework for only 10 minutes before back pain   Target Date 05/14/21      PT SHORT TERM GOAL #4   Title Pt will be able to sit x 30 min without exacerbation of pain    Baseline pain on standing afterwards but can sit    Time 4    Period Weeks    Status Achieved    Target Date 05/14/21               PT Long Term Goals - 05/29/21 1027       PT LONG TERM GOAL #1   Title Pt will be ind with advanced HEP and learn dos and don'ts for bone health given ostepenia/compression fracture history.    Time 8    Period Weeks    Status On-going      PT LONG TERM GOAL #3   Title Improved LE strength of at least 4+/5 with functional ability to perform stairs at work/home and SLS x 10 sec with good knee and hip stability.    Baseline met for SLS, hips 4/5 for ext and flexion, met for all other motions, knees 4+/5    Time 8    Period Weeks    Status Achieved  Plan - 06/03/21 1441     Clinical Impression Statement Patient demonstrates improved bil LE strength as she tolerated increased resistance and/or repetitions of select exercises without increased pain. She demos intermittent valgus with step down activity requiring verbal cues to correct. Reports decresaed stiffness after being introduced to standing prayer stretch. Expect that patient will be ready  for D/C at end of current POC.    Personal Factors and Comorbidities Comorbidity 1;Comorbidity 2    Comorbidities osteopenia with multiple anterior wedge compression fractures, scoliosis    Examination-Activity Limitations Lift;Stand;Stairs;Sit;Bend;Locomotion Level    Examination-Participation Restrictions Meal Prep;Cleaning;Community Activity;Driving;Laundry;Yard Work    Rehab Potential Good    PT Frequency 2x / week    PT Duration 8 weeks    PT Treatment/Interventions ADLs/Self Care Home Management;Electrical Stimulation;Moist Heat;Cryotherapy;Neuromuscular re-education;Balance training;Therapeutic exercise;Therapeutic activities;Functional mobility training;Patient/family education;Manual techniques;Passive range of motion;Dry needling;Taping    PT Next Visit Plan begin to finalize HEP    PT Home Exercise Plan Access Code: [email or web address]    Consulted and Agree with Plan of Care Patient             Patient will benefit from skilled therapeutic intervention in order to improve the following deficits and impairments:  Decreased range of motion, Increased muscle spasms, Postural dysfunction, Decreased strength, Decreased mobility, Impaired flexibility, Pain, Decreased balance, Decreased activity tolerance  Visit Diagnosis: Pain in right hip  Muscle weakness (generalized)  Abnormal posture  Chronic right-sided low back pain without sciatica     Problem List Patient Active Problem List   Diagnosis Date Noted   Degenerative disc disease, lumbar 03/27/2021   Right knee pain 07/29/2020   Hyperglycemia 04/01/2020   Lateral epicondylitis of right elbow 11/11/2018   Basal cell carcinoma (BCC) 01/10/2018   Gastroesophageal reflux disease 11/12/2017   Idiopathic scoliosis 05/14/2016   Alcohol abuse 05/14/2016   Rectovaginal fistula    C. difficile colitis    Crohn's disease ([place]) 05/27/2009   HEPATOMEGALY, HX OF 08/31/2008   Hypothyroidism 07/30/2008   Hypertriglyceridemia 07/30/2008    Anxiety 07/30/2008   Depression 07/30/2008   RENAL CYST 07/30/2008   DEGENERATIVE DISC DISEASE 07/30/2008   Osteopenia 07/30/2008   Everardo All PT, DPT 06/03/21 2:44 PM   [facility] [address] [place], [place], [address] Phone: [phone]   Fax:  [phone]  Name: Tammy Kennedy MRN: [id number] Date of Birth: 1956-06-26

## 2021-06-05 ENCOUNTER — Ambulatory Visit (INDEPENDENT_AMBULATORY_CARE_PROVIDER_SITE_OTHER)
Admission: RE | Admit: 2021-06-05 | Discharge: 2021-06-05 | Disposition: A | Discharge status: Home / Self Care | Payer: Medicare Other | Source: Ambulatory Visit | Attending: Gastroenterology | Admitting: Gastroenterology

## 2021-06-05 ENCOUNTER — Other Ambulatory Visit: Payer: Self-pay

## 2021-06-05 DIAGNOSIS — K5080 Crohn's disease of both small and large intestine without complications: Secondary | ICD-10-CM | POA: Diagnosis not present

## 2021-06-05 DIAGNOSIS — D84821 Immunodeficiency due to drugs: Secondary | ICD-10-CM | POA: Diagnosis not present

## 2021-06-05 DIAGNOSIS — Z79899 Other long term (current) drug therapy: Secondary | ICD-10-CM

## 2021-06-06 ENCOUNTER — Encounter: Payer: Self-pay | Admitting: Physical Therapy

## 2021-06-06 ENCOUNTER — Ambulatory Visit: Payer: Medicare Other | Admitting: Physical Therapy

## 2021-06-06 DIAGNOSIS — M545 Low back pain, unspecified: Secondary | ICD-10-CM | POA: Diagnosis not present

## 2021-06-06 DIAGNOSIS — R293 Abnormal posture: Secondary | ICD-10-CM

## 2021-06-06 DIAGNOSIS — M6281 Muscle weakness (generalized): Secondary | ICD-10-CM | POA: Diagnosis not present

## 2021-06-06 DIAGNOSIS — G8929 Other chronic pain: Secondary | ICD-10-CM | POA: Diagnosis not present

## 2021-06-06 DIAGNOSIS — M25551 Pain in right hip: Secondary | ICD-10-CM | POA: Diagnosis not present

## 2021-06-06 NOTE — Therapy (Signed)
[facility] [address] [place], [place], [address] Phone: [phone]   Fax:  [phone]  Physical Therapy Treatment  Patient Details  Name: DANIELL PARADISE MRN: [id number] Date of Birth: [birth year] Referring Provider (PT): Lyndal Pulley, DO   Encounter Date: 06/06/2021   PT End of Session - 06/06/21 0932     Visit Number 16    Date for PT Re-Evaluation 06/11/21    Authorization Type BCBS - will be updating insurance soon to Medicare    Progress Note Due on Visit 20    PT Start Time 0932    PT Stop Time 1015    PT Time Calculation (min) 43 min    Activity Tolerance Patient tolerated treatment well;No increased pain    Behavior During Therapy [facility] for tasks assessed/performed             Past Medical History:  Diagnosis Date   Alcohol abuse 05/14/2016   ALCOHOL ABUSE, HX OF    ANEMIA    ANXIETY DISORDER    Basal cell carcinoma (BCC) 01/10/2018   C. difficile colitis    CROHN'S DISEASE    DEPRESSION    DRY EYE SYNDROME    HEPATOMEGALY, HX OF    HYPERLIPIDEMIA    intol of statin trials   HYPOTHYROIDISM    OSTEOPENIA    Osteoporosis 05/2017   states Pre osteoporosis 8/18   Rectovaginal fistula 2012   chron's disease   Tubular adenoma 2013    Past Surgical History:  Procedure Laterality Date   ANAL FISSURE REPAIR     CATARACT EXTRACTION     EYE SURGERY  11/2017   macular pucker   HEMORRHOID SURGERY     Left thumb surgery  1989   MOHS SURGERY  2008   Nose [facility]   Theraputic abortion      There were no vitals filed for this visit.   Subjective Assessment - 06/06/21 0943     Subjective Pt reports increased LBP following unpacking her kitchen cabinets last weekend.  Pain took several days to improve.  Did walk with new shoes this week and felt better.    Pertinent History osteopenia - related to chronic steroid use for Crohn's disease - has mutliple anterior wedge vertebrae: T12, L1, L3, L5     Limitations Sitting;Standing;House hold activities;Lifting    How long can you sit comfortably? 20-30 min, Rt LE    How long can you stand comfortably? 20-30 min (for back)    How long can you walk comfortably? 30 min    Diagnostic tests 03/2021 lumbar xray: severe lumbar degeneration, anterior wedging T12, L1, L3, L5, scoliosis, 03/2021 Rt knee xray negative    Patient Stated Goals pain relief, be able walk around the block or more for exercise (has housemate with cancer who needs to walk too)    Currently in Pain? Yes    Pain Score 1     Pain Location Back    Pain Orientation Lower;Right    Pain Descriptors / Indicators Tightness    Pain Type Chronic pain    Pain Onset More than a month ago    Pain Frequency Intermittent    Aggravating Factors  standing, lifting, bending    Multiple Pain Sites Yes    Pain Score 1    Pain Location Knee    Pain Orientation Right    Pain Descriptors / Indicators Dull    Pain Type Chronic pain  Pain Radiating Towards along quad muscle    Pain Onset More than a month ago    Aggravating Factors  movement, sitting >30'                [facility] PT Assessment - 06/06/21 0001       Assessment   Medical Diagnosis M54.50 (ICD-10-CM) - Acute right-sided low back pain without sciatica  M25.50 (ICD-10-CM) - Polyarthralgia    Referring Provider (PT) Lyndal Pulley, DO    Onset Date/Surgical Date --   1 year for Rt leg, 2 months for LBP   Hand Dominance Right    Next MD Visit 2.5 weeks      Precautions   Precaution Comments osteopenia - has anterior wedge fractures      Observation/Other Assessments   Focus on Therapeutic Outcomes (FOTO)  53%      Single Leg Stance   Comments 10 sec Rt/Lt w/o UE support      Posture/Postural Control   Posture/Postural Control Postural limitations    Posture Comments scoliosis Lt SB, Rt Rot, signif rib hump on Rt, depressed Rt shoulder girdle      Strength   Overall Strength Comments 4+/5 Rt hip, 5/5 all  other LE muscle groups bil                           [facility] Adult PT Treatment/Exercise - 06/06/21 0001       Exercises   Exercises Shoulder;Lumbar;Knee/Hip      Lumbar Exercises: Stretches   Quad Stretch Right;30 seconds      Lumbar Exercises: Standing   Other Standing Lumbar Exercises chops; green tband; x15 Lt/Rt    Other Standing Lumbar Exercises thoracic extension at counter 5x5" holds      Lumbar Exercises: Seated   Other Seated Lumbar Exercises thoracic extension in chair over ball and along vertical foam roller      Knee/Hip Exercises: Supine   Other Supine Knee/Hip Exercises standing lateral, forward step ups 4" riser intermittent single UE support, step downs Lt x 10 for Rt knee valgus assessment and control x 10 each                      PT Short Term Goals - 05/29/21 1025       PT SHORT TERM GOAL #1   Title ind with initial HEP    Baseline not compliant    Time 4    Period Weeks    Status Achieved   independent but not compliant   Target Date 05/14/21      PT SHORT TERM GOAL #2   Title Pt will be able to demo improved SLS balance on each LE x 10 sec    Time 4    Period Weeks    Status Achieved    Target Date 05/14/21      PT SHORT TERM GOAL #3   Title Pt will report tolerance of 20-30 min of light standing housework without exacerbation of pain    Baseline can do but increased pain    Time 4    Period Weeks    Status On-going   can walk through a store for 30 minutes without pain but able to perform housework for only 10 minutes before back pain   Target Date 05/14/21      PT SHORT TERM GOAL #4   Title Pt will be able to sit x 30  min without exacerbation of pain    Baseline pain on standing afterwards but can sit    Time 4    Period Weeks    Status Achieved    Target Date 05/14/21               PT Long Term Goals - 06/06/21 0933       PT LONG TERM GOAL #1   Title Pt will be ind with advanced HEP and learn dos  and don'ts for bone health given ostepenia/compression fracture history.    Baseline non-compliant with HEP but does understand dos and don'ts    Status Partially Met      PT LONG TERM GOAL #2   Title FOTO score improved by at least 10 points to 62%    Baseline 53%    Status Partially Met      PT LONG TERM GOAL #3   Title Improved LE strength of at least 4+/5 with functional ability to perform stairs at work/home and SLS x 10 sec with good knee and hip stability.    Baseline 4+/5 Rt hip    Status Achieved      PT LONG TERM GOAL #4   Title Reduced pain by at least 50% for back and Rt thigh with all daily tasks.    Baseline 20-30% or more but not with heavier tasks    Status Partially Met      PT LONG TERM GOAL #5   Title Able to walk for exercise x at least 2 laps around block at home without exacerbation of pain    Baseline has done 1 lap and thinks could do 2 but hasn't tried    Status Partially Met                   Plan - 06/06/21 1004     Clinical Impression Statement Pt has met or partially met all LTGs.  FOTO score ranged from 53-57% over course of PT with baseline of 52%.  Pt's progress is somewhat limited secondary to non-compliance of HEP which targets her LE flexibility, gait endurance and back/hip strength.  Pt reports 20-30% improvement overall with PT.  She demos improved postural control and alignment during ther ex.  Rt hip strength has improved to 4+/5 for flexion and is grossly 5/5 for all other LE strength.  She has ongoing tightness in Rt quad which should improve with consistent stretching.  Rt knee valgus control is much improved with lateral step ups and step downs from 4" step.  PT reiterated dos and don'ts for osteoporosis as Pt had increased pain unloading cabinets at home for kitchen remodeling last weekend.  PT encouraged Pt to work towards greater gait distance, LE flexibility and commitment to strengthening aspect of HEP to seek ongoing improvement  and management of symptoms at home.  D/C to HEP for now.    Comorbidities osteopenia with multiple anterior wedge compression fractures, scoliosis    PT Treatment/Interventions ADLs/Self Care Home Management;Electrical Stimulation;Moist Heat;Cryotherapy;Neuromuscular re-education;Balance training;Therapeutic exercise;Therapeutic activities;Functional mobility training;Patient/family education;Manual techniques;Passive range of motion;Dry needling;Taping    PT Next Visit Plan d/c to HEP    PT Home Exercise Plan Access Code: [email or web address]    Consulted and Agree with Plan of Care Patient             Patient will benefit from skilled therapeutic intervention in order to improve the following deficits and impairments:     Visit Diagnosis: Pain in right hip  Muscle weakness (generalized)  Abnormal posture     Problem List Patient Active Problem List   Diagnosis Date Noted   Degenerative disc disease, lumbar 03/27/2021   Right knee pain 07/29/2020   Hyperglycemia 04/01/2020   Lateral epicondylitis of right elbow 11/11/2018   Basal cell carcinoma (BCC) 01/10/2018   Gastroesophageal reflux disease 11/12/2017   Idiopathic scoliosis 05/14/2016   Alcohol abuse 05/14/2016   Rectovaginal fistula    C. difficile colitis    Crohn's disease ([place]) 05/27/2009   HEPATOMEGALY, HX OF 08/31/2008   Hypothyroidism 07/30/2008   Hypertriglyceridemia 07/30/2008   Anxiety 07/30/2008   Depression 07/30/2008   RENAL CYST 07/30/2008   DEGENERATIVE [place] DISEASE 07/30/2008   Osteopenia 07/30/2008    PHYSICAL THERAPY DISCHARGE SUMMARY  Visits from Start of Care: 16  Current functional level related to goals / functional outcomes: See above   Remaining deficits: See above.  Pt understands she needs to focus on more compliance with HEP to gain further benefits.   Education / Equipment: HEP  Patient agrees to discharge. Patient goals were partially met. Patient is being discharged due to maximized  rehab potential.   Baruch Merl, PT 06/06/21 12:03 PM  [facility] [address] [place], [place], [address] Phone: [phone]   Fax:  [phone]  Name: ZIAN MOHAMED MRN: [id number] Date of Birth: [birth year]

## 2021-06-08 DIAGNOSIS — D84821 Immunodeficiency due to drugs: Secondary | ICD-10-CM | POA: Diagnosis not present

## 2021-06-08 DIAGNOSIS — K5080 Crohn's disease of both small and large intestine without complications: Secondary | ICD-10-CM | POA: Diagnosis not present

## 2021-06-08 DIAGNOSIS — Z79899 Other long term (current) drug therapy: Secondary | ICD-10-CM | POA: Diagnosis not present

## 2021-06-09 NOTE — Progress Notes (Signed)
Subjective:    Patient ID: Tammy Kennedy, female    DOB: 1955/11/01, 65 y.o.   MRN: [id number]   This visit occurred during the SARS-CoV-2 public health emergency.  Safety protocols were in place, including screening questions prior to the visit, additional usage of staff PPE, and extensive cleaning of exam room while observing appropriate contact time as indicated for disinfecting solutions.    HPI She is here for a physical exam.   No concerns.   Did PT for her back and it has helped.    Off both calcium and vitamin d since mid July.  Calcium level was high and vitamin D level was on the high side.  Medications and allergies reviewed with patient and updated if appropriate.  Patient Active Problem List   Diagnosis Date Noted   Degenerative disc disease, lumbar 03/27/2021   Right knee pain 07/29/2020   Hyperglycemia 04/01/2020   Lateral epicondylitis of right elbow 11/11/2018   Basal cell carcinoma (BCC) 01/10/2018   Gastroesophageal reflux disease 11/12/2017   Idiopathic scoliosis 05/14/2016   Alcohol abuse 05/14/2016   Rectovaginal fistula    C. difficile colitis    Crohn's disease ([place]) 05/27/2009   HEPATOMEGALY, HX OF 08/31/2008   Hypothyroidism 07/30/2008   Hypertriglyceridemia 07/30/2008   Anxiety 07/30/2008   Depression 07/30/2008   RENAL CYST 07/30/2008   DEGENERATIVE [place] DISEASE 07/30/2008   Osteopenia 07/30/2008    Current Outpatient Medications on File Prior to Visit  Medication Sig Dispense Refill   calcium-vitamin D (OSCAL WITH D) 500-200 MG-UNIT tablet Take 1 tablet by mouth daily.     cholecalciferol (VITAMIN D3) 25 MCG (1000 UNIT) tablet Take 1,000 Units by mouth daily.     fenofibrate 160 MG tablet Take 1 tablet (160 mg total) by mouth daily. 90 tablet 3   folic acid (FOLVITE) 1 MG tablet Take 2 mg by mouth daily.     levothyroxine (SYNTHROID) 88 MCG tablet TAKE 1 TABLET BY MOUTH DAILY 30 tablet 2   mesalamine (APRISO) 0.375 g 24 hr capsule  Take 3 capsules (1.125 g total) by mouth daily. Take 3 tabs daily for 1 Month, Then take 4 Tabs daily from then on (Patient taking differently: Take 1,125 mg by mouth daily. 4 Tabs daily) 90 capsule 7   Potassium Gluconate 550 MG TABS Take 2 tablets by mouth daily.      Sennosides (EX-LAX PO) Take by mouth as directed.     valACYclovir (VALTREX) 500 MG tablet TAKE 1 TABLET(500 MG) BY MOUTH DAILY 90 tablet 1   venlafaxine XR (EFFEXOR-XR) 150 MG 24 hr capsule TAKE 1 CAPSULE BY MOUTH EVERY MORNING WITH BREAKFAST 90 capsule 1   venlafaxine XR (EFFEXOR-XR) 75 MG 24 hr capsule TAKE 1 CAPSULE BY MOUTH DAILY WITH BREAKFAST IN ADDITION TO 150 MG DAILY 90 capsule 0   vitamin B-12 (CYANOCOBALAMIN) 500 MCG tablet Take 500 mcg by mouth daily.     No current facility-administered medications on file prior to visit.    Past Medical History:  Diagnosis Date   Alcohol abuse 05/14/2016   ALCOHOL ABUSE, HX OF    ANEMIA    ANXIETY DISORDER    Basal cell carcinoma (BCC) 01/10/2018   C. difficile colitis    CROHN'S DISEASE    DEPRESSION    DRY EYE SYNDROME    HEPATOMEGALY, HX OF    HYPERLIPIDEMIA    intol of statin trials   HYPOTHYROIDISM    OSTEOPENIA    Osteoporosis  05/2017   states Pre osteoporosis 8/18   Rectovaginal fistula 2012   chron's disease   Tubular adenoma 2013    Past Surgical History:  Procedure Laterality Date   ANAL FISSURE REPAIR     CATARACT EXTRACTION     EYE SURGERY  11/2017   macular pucker   HEMORRHOID SURGERY     Left thumb surgery  1989   MOHS SURGERY  2008   Nose BCC   Theraputic abortion      Social History   Socioeconomic History   Marital status: Divorced    Spouse name: Not on file   Number of children: Not on file   Years of education: Not on file   Highest education level: Not on file  Occupational History   Not on file  Tobacco Use   Smoking status: Never   Smokeless tobacco: Never  Vaping Use   Vaping Use: Never used  Substance and Sexual Activity    Alcohol use: Yes    Comment: has drank excessively, not drinking daily   Drug use: No   Sexual activity: Not on file  Other Topics Concern   Not on file  Social History Narrative   Works at [employer]- [employer]- part time   Social Determinants of Radio broadcast assistant Strain: Not on file  Food Insecurity: Not on file  Transportation Needs: Not on file  Physical Activity: Not on file  Stress: Not on file  Social Connections: Not on file    Family History  Adopted: Yes  Problem Relation Age of Onset   Breast cancer Neg Hx     Review of Systems  Constitutional:  Negative for chills and fever.  Eyes:  Negative for visual disturbance.  Respiratory:  Negative for cough, shortness of breath and wheezing.   Cardiovascular:  Negative for chest pain, palpitations and leg swelling.  Gastrointestinal:  Positive for constipation. Negative for abdominal pain, blood in stool, diarrhea and nausea.       Mild, occ gerd  Genitourinary:  Negative for dysuria and hematuria.  Musculoskeletal:  Positive for back pain. Negative for arthralgias.  Skin:  Negative for rash.  Neurological:  Negative for light-headedness and headaches.  Psychiatric/Behavioral:  Positive for dysphoric mood. The patient is nervous/anxious.       Objective:   Vitals:   06/10/21 1506  BP: 100/82  Pulse: 100  Temp: 98.4 F (36.9 C)  SpO2: 97%   Filed Weights   06/10/21 1506  Weight: 115 lb (52.2 kg)   Body mass index is 23.23 kg/m.  BP Readings from Last 3 Encounters:  06/10/21 100/82  05/02/21 110/78  04/29/21 110/80    Wt Readings from Last 3 Encounters:  06/10/21 115 lb (52.2 kg)  05/02/21 110 lb (49.9 kg)  04/29/21 111 lb 12.8 oz (50.7 kg)     Physical Exam Constitutional: She appears well-developed and well-nourished. No distress.  HENT:  Head: Normocephalic and atraumatic.  Right Ear: External ear normal. Normal ear canal and TM Left Ear: External ear  normal.  Normal ear canal and TM Mouth/Throat: Oropharynx is clear and moist.  Eyes: Conjunctivae and EOM are normal.  Neck: Neck supple. No tracheal deviation present. No thyromegaly present.  No carotid bruit  Cardiovascular: Normal rate, regular rhythm and normal heart sounds.   No murmur heard.  No edema. Pulmonary/Chest: Effort normal and breath sounds normal. No respiratory distress. She has no wheezes. She has no rales.  Breast: deferred  Abdominal: Soft. She exhibits no distension. There is no tenderness.  Lymphadenopathy: She has no cervical adenopathy.  Skin: Skin is warm and dry. She is not diaphoretic.  Psychiatric: She has a anxious mood and affect. Her behavior is normal.     Lab Results  Component Value Date   WBC 5.9 03/27/2021   HGB 12.9 03/27/2021   HCT 37.8 03/27/2021   PLT 336.0 03/27/2021   GLUCOSE 95 03/27/2021   CHOL 218 (H) 06/05/2020   TRIG 278 (H) 06/05/2020   HDL 70 06/05/2020   LDLDIRECT 121.0 11/16/2016   LDLCALC 108 (H) 06/05/2020   ALT 11 03/27/2021   AST 18 03/27/2021   NA 140 03/27/2021   K 4.2 03/27/2021   CL 103 03/27/2021   CREATININE 0.93 03/27/2021   BUN 26 (H) 03/27/2021   CO2 30 03/27/2021   TSH 0.18 (L) 03/27/2021   HGBA1C 5.4 06/05/2020         Assessment & Plan:   Physical exam: Screening blood work  ordered Exercise  trying to walk more Weight  normal Substance abuse  barely drinks at all Sees derm regularly  Reviewed recommended immunizations.  Pneumovax 20 today   Health Maintenance  Topic Date Due   PAP SMEAR-Modifier  05/29/2017   COVID-19 Vaccine (3 - Pfizer risk series) 02/23/2020   PNA vac Low Risk Adult (2 of 2 - PPSV23) 05/30/2021   Zoster Vaccines- Shingrix (1 of 2) 09/10/2021 (Originally 05/31/1975)   INFLUENZA VACCINE  01/10/2022 (Originally 05/12/2021)   COLONOSCOPY (Pts 45-49yr Insurance coverage will need to be confirmed)  04/24/2023   MAMMOGRAM  05/08/2023   DEXA SCAN  06/07/2023   TETANUS/TDAP   06/04/2029   Hepatitis C Screening  Completed   HIV Screening  Completed   HPV VACCINES  Aged Out          See Problem List for Assessment and Plan of chronic medical problems.

## 2021-06-09 NOTE — Patient Instructions (Addendum)
Blood work was ordered.     Medications changes include :   none     Please followup in 6 months    Health Maintenance, Female Adopting a healthy lifestyle and getting preventive care are important in promoting health and wellness. Ask your health care provider about: The right schedule for you to have regular tests and exams. Things you can do on your own to prevent diseases and keep yourself healthy. What should I know about diet, weight, and exercise? Eat a healthy diet  Eat a diet that includes plenty of vegetables, fruits, low-fat dairy products, and lean protein. Do not eat a lot of foods that are high in solid fats, added sugars, or sodium.  Maintain a healthy weight Body mass index (BMI) is used to identify weight problems. It estimates body fat based on height and weight. Your health care provider can help determineyour BMI and help you achieve or maintain a healthy weight. Get regular exercise Get regular exercise. This is one of the most important things you can do for your health. Most adults should: Exercise for at least 150 minutes each week. The exercise should increase your heart rate and make you sweat (moderate-intensity exercise). Do strengthening exercises at least twice a week. This is in addition to the moderate-intensity exercise. Spend less time sitting. Even light physical activity can be beneficial. Watch cholesterol and blood lipids Have your blood tested for lipids and cholesterol at 65 years of age, then havethis test every 5 years. Have your cholesterol levels checked more often if: Your lipid or cholesterol levels are high. You are older than 65 years of age. You are at high risk for heart disease. What should I know about cancer screening? Depending on your health history and family history, you may need to have cancer screening at various ages. This may include screening for: Breast cancer. Cervical cancer. Colorectal cancer. Skin  cancer. Lung cancer. What should I know about heart disease, diabetes, and high blood pressure? Blood pressure and heart disease High blood pressure causes heart disease and increases the risk of stroke. This is more likely to develop in people who have high blood pressure readings, are of African descent, or are overweight. Have your blood pressure checked: Every 3-5 years if you are 18-39 years of age. Every year if you are 40 years old or older. Diabetes Have regular diabetes screenings. This checks your fasting blood sugar level. Have the screening done: Once every three years after age 40 if you are at a normal weight and have a low risk for diabetes. More often and at a younger age if you are overweight or have a high risk for diabetes. What should I know about preventing infection? Hepatitis B If you have a higher risk for hepatitis B, you should be screened for this virus. Talk with your health care provider to find out if you are at risk forhepatitis B infection. Hepatitis C Testing is recommended for: Everyone born from 1945 through 1965. Anyone with known risk factors for hepatitis C. Sexually transmitted infections (STIs) Get screened for STIs, including gonorrhea and chlamydia, if: You are sexually active and are younger than 65 years of age. You are older than 65 years of age and your health care provider tells you that you are at risk for this type of infection. Your sexual activity has changed since you were last screened, and you are at increased risk for chlamydia or gonorrhea. Ask your health care provider if you are   at risk. Ask your health care provider about whether you are at high risk for HIV. Your health care provider may recommend a prescription medicine to help prevent HIV infection. If you choose to take medicine to prevent HIV, you should first get tested for HIV. You should then be tested every 3 months for as long as you are taking the medicine. Pregnancy If  you are about to stop having your period (premenopausal) and you may become pregnant, seek counseling before you get pregnant. Take 400 to 800 micrograms (mcg) of folic acid every day if you become pregnant. Ask for birth control (contraception) if you want to prevent pregnancy. Osteoporosis and menopause Osteoporosis is a disease in which the bones lose minerals and strength with aging. This can result in bone fractures. If you are 65 years old or older, or if you are at risk for osteoporosis and fractures, ask your health care provider if you should: Be screened for bone loss. Take a calcium or vitamin D supplement to lower your risk of fractures. Be given hormone replacement therapy (HRT) to treat symptoms of menopause. Follow these instructions at home: Lifestyle Do not use any products that contain nicotine or tobacco, such as cigarettes, e-cigarettes, and chewing tobacco. If you need help quitting, ask your health care provider. Do not use street drugs. Do not share needles. Ask your health care provider for help if you need support or information about quitting drugs. Alcohol use Do not drink alcohol if: Your health care provider tells you not to drink. You are pregnant, may be pregnant, or are planning to become pregnant. If you drink alcohol: Limit how much you use to 0-1 drink a day. Limit intake if you are breastfeeding. Be aware of how much alcohol is in your drink. In the [place] one drink equals one 12 oz bottle of beer (355 mL), one 5 oz glass of wine (148 mL), or one 1 oz glass of hard liquor (44 mL). General instructions Schedule regular health, dental, and eye exams. Stay current with your vaccines. Tell your health care provider if: You often feel depressed. You have ever been abused or do not feel safe at home. Summary Adopting a healthy lifestyle and getting preventive care are important in promoting health and wellness. Follow your health care provider's  instructions about healthy diet, exercising, and getting tested or screened for diseases. Follow your health care provider's instructions on monitoring your cholesterol and blood pressure. This information is not intended to replace advice given to you by your health care provider. Make sure you discuss any questions you have with your healthcare provider. Document Revised: 09/21/2018 Document Reviewed: 09/21/2018 Elsevier Patient Education  2022 Elsevier Inc.  

## 2021-06-10 ENCOUNTER — Ambulatory Visit (INDEPENDENT_AMBULATORY_CARE_PROVIDER_SITE_OTHER): Payer: Medicare Other | Admitting: Internal Medicine

## 2021-06-10 ENCOUNTER — Other Ambulatory Visit: Payer: Self-pay

## 2021-06-10 ENCOUNTER — Encounter: Payer: Self-pay | Admitting: Internal Medicine

## 2021-06-10 VITALS — BP 100/82 | HR 100 | Temp 98.4°F | Ht 59.0 in | Wt 115.0 lb

## 2021-06-10 DIAGNOSIS — E038 Other specified hypothyroidism: Secondary | ICD-10-CM

## 2021-06-10 DIAGNOSIS — Z23 Encounter for immunization: Secondary | ICD-10-CM

## 2021-06-10 DIAGNOSIS — E781 Pure hyperglyceridemia: Secondary | ICD-10-CM | POA: Diagnosis not present

## 2021-06-10 DIAGNOSIS — Z0000 Encounter for general adult medical examination without abnormal findings: Secondary | ICD-10-CM | POA: Diagnosis not present

## 2021-06-10 DIAGNOSIS — F1010 Alcohol abuse, uncomplicated: Secondary | ICD-10-CM

## 2021-06-10 DIAGNOSIS — K50919 Crohn's disease, unspecified, with unspecified complications: Secondary | ICD-10-CM | POA: Diagnosis not present

## 2021-06-10 DIAGNOSIS — R739 Hyperglycemia, unspecified: Secondary | ICD-10-CM

## 2021-06-10 DIAGNOSIS — M85852 Other specified disorders of bone density and structure, left thigh: Secondary | ICD-10-CM

## 2021-06-10 DIAGNOSIS — F419 Anxiety disorder, unspecified: Secondary | ICD-10-CM

## 2021-06-10 DIAGNOSIS — F3289 Other specified depressive episodes: Secondary | ICD-10-CM

## 2021-06-10 LAB — CBC WITH DIFFERENTIAL/PLATELET
Basophils Absolute: 0.1 10*3/uL (ref 0.0–0.1)
Basophils Relative: 1.1 % (ref 0.0–3.0)
Eosinophils Absolute: 0.1 10*3/uL (ref 0.0–0.7)
Eosinophils Relative: 1.5 % (ref 0.0–5.0)
HCT: 40.4 % (ref 36.0–46.0)
Hemoglobin: 13.5 g/dL (ref 12.0–15.0)
Lymphocytes Relative: 27.3 % (ref 12.0–46.0)
Lymphs Abs: 1.2 10*3/uL (ref 0.7–4.0)
MCHC: 33.3 g/dL (ref 30.0–36.0)
MCV: 101.4 fl — ABNORMAL HIGH (ref 78.0–100.0)
Monocytes Absolute: 0.6 10*3/uL (ref 0.1–1.0)
Monocytes Relative: 12.7 % — ABNORMAL HIGH (ref 3.0–12.0)
Neutro Abs: 2.6 10*3/uL (ref 1.4–7.7)
Neutrophils Relative %: 57.4 % (ref 43.0–77.0)
Platelets: 315 10*3/uL (ref 150.0–400.0)
RBC: 3.98 Mil/uL (ref 3.87–5.11)
RDW: 13.5 % (ref 11.5–15.5)
WBC: 4.6 10*3/uL (ref 4.0–10.5)

## 2021-06-10 LAB — COMPREHENSIVE METABOLIC PANEL
ALT: 21 U/L (ref 0–35)
AST: 31 U/L (ref 0–37)
Albumin: 4.2 g/dL (ref 3.5–5.2)
Alkaline Phosphatase: 52 U/L (ref 39–117)
BUN: 24 mg/dL — ABNORMAL HIGH (ref 6–23)
CO2: 26 mEq/L (ref 19–32)
Calcium: 10.7 mg/dL — ABNORMAL HIGH (ref 8.4–10.5)
Chloride: 104 mEq/L (ref 96–112)
Creatinine, Ser: 0.9 mg/dL (ref 0.40–1.20)
GFR: 67.31 mL/min (ref >60.00)
Glucose, Bld: 84 mg/dL (ref 70–99)
Potassium: 3.8 mEq/L (ref 3.5–5.1)
Sodium: 139 mEq/L (ref 135–145)
Total Bilirubin: 0.2 mg/dL (ref 0.2–1.2)
Total Protein: 7.2 g/dL (ref 6.0–8.3)

## 2021-06-10 LAB — LIPID PANEL
Cholesterol: 249 mg/dL — ABNORMAL HIGH (ref 0–200)
HDL: 71.3 mg/dL (ref >39.00)
NonHDL: 177.21
Total CHOL/HDL Ratio: 3
Triglycerides: 230 mg/dL — ABNORMAL HIGH (ref 0.0–149.0)
VLDL: 46 mg/dL — ABNORMAL HIGH (ref 0.0–40.0)

## 2021-06-10 LAB — T4, FREE: Free T4: 1.11 ng/dL (ref 0.60–1.60)

## 2021-06-10 LAB — TSH: TSH: 0.1 u[IU]/mL — ABNORMAL LOW (ref 0.35–5.50)

## 2021-06-10 LAB — LDL CHOLESTEROL, DIRECT: Direct LDL: 156 mg/dL

## 2021-06-10 MED ORDER — B COMPLEX VITAMINS PO CAPS: 1.0000 | ORAL_CAPSULE | Freq: Every day | ORAL | Status: DC

## 2021-06-10 NOTE — Assessment & Plan Note (Signed)
History of alcohol abuse States she is barely drinking at all

## 2021-06-10 NOTE — Assessment & Plan Note (Addendum)
Chronic  I am concerned about her compliance with medication-I do think sometimes she takes more than prescribed for hopes that it will help her energy level, but discussed her decreased energy level is not related to her thyroid Currently taking levothyroxine 88 mcg Check tsh  Titrate med dose if needed

## 2021-06-10 NOTE — Assessment & Plan Note (Signed)
Chronic Check lipid panel, CMP Continue fenofibrate 160 mg daily Encourage regular walking

## 2021-06-10 NOTE — Assessment & Plan Note (Signed)
She stopped taking her calcium and vitamin D in July CMP, PTH

## 2021-06-10 NOTE — Assessment & Plan Note (Addendum)
Chronic Still gets depressed at times, but ok controlled She would rather not make a change in medications we will continue her on Effexor 220 mg daily Continue regular walking

## 2021-06-10 NOTE — Assessment & Plan Note (Signed)
Fairly controlled, but still has some anxiety She does not want to change medications at this time Continue Effexor to 25 mg daily

## 2021-06-10 NOTE — Assessment & Plan Note (Signed)
Chronic CMP A1c has been in the normal range

## 2021-06-10 NOTE — Assessment & Plan Note (Signed)
Chronic Reviewed recent DEXA and she would prefer to hold off on treatment Stressed regular exercise Rechecking calcium and vitamin D and will get further direction on that Discussed importance of keeping her thyroid level in the normal range

## 2021-06-10 NOTE — Assessment & Plan Note (Signed)
Chronic Overall controlled On mesalamine

## 2021-06-11 NOTE — Addendum Note (Signed)
Addended by: Marcina Millard on: 06/11/2021 08:16 AM   Modules accepted: Orders

## 2021-06-12 LAB — PTH, INTACT AND CALCIUM
Calcium: 10.6 mg/dL — ABNORMAL HIGH (ref 8.6–10.4)
PTH: 21 pg/mL (ref 16–77)

## 2021-06-12 MED ORDER — LEVOTHYROXINE SODIUM 75 MCG PO TABS: 75.0000 ug | ORAL_TABLET | Freq: Every day | ORAL | 1 refills | Status: DC

## 2021-06-12 NOTE — Addendum Note (Signed)
Addended by: Binnie Rail on: 06/12/2021 06:59 PM   Modules accepted: Orders

## 2021-06-14 ENCOUNTER — Other Ambulatory Visit: Payer: Self-pay | Admitting: Internal Medicine

## 2021-06-14 DIAGNOSIS — E782 Mixed hyperlipidemia: Secondary | ICD-10-CM

## 2021-06-14 MED ORDER — ROSUVASTATIN CALCIUM 5 MG PO TABS: 5.0000 mg | ORAL_TABLET | Freq: Every day | ORAL | 3 refills | Status: DC

## 2021-07-02 ENCOUNTER — Other Ambulatory Visit: Payer: Self-pay

## 2021-07-02 ENCOUNTER — Ambulatory Visit (INDEPENDENT_AMBULATORY_CARE_PROVIDER_SITE_OTHER): Payer: Medicare Other | Admitting: Family Medicine

## 2021-07-02 ENCOUNTER — Ambulatory Visit (INDEPENDENT_AMBULATORY_CARE_PROVIDER_SITE_OTHER): Payer: Medicare Other

## 2021-07-02 VITALS — BP 110/80 | HR 100 | Ht 59.0 in | Wt 116.0 lb

## 2021-07-02 DIAGNOSIS — M5136 Other intervertebral disc degeneration, lumbar region: Secondary | ICD-10-CM | POA: Diagnosis not present

## 2021-07-02 NOTE — Assessment & Plan Note (Signed)
Patient does have degenerative disc disease as well as though the pain seems to be somewhat more in the groin area and patient does have some weakness noted.  At this point I do feel advanced imaging will be warranted.  I do think that we should get an x-ray also of the right hip to further evaluate the amount of arthritic changes that could be playing a role.  Patient has failed all other conservative therapy including oral anti-inflammatories, icing regimen, topical anti-inflammatories and formal physical therapy.  Pain has been going on significantly longer than 6 months patient has now plateaued on any type of improvement.  Depending on imaging patient could be a candidate for potential epidurals.  Follow-up with me again after imaging to discuss further.

## 2021-07-02 NOTE — Patient Instructions (Addendum)
Xray today [facility] [phone] Call Today  When we receive your results we will contact you.

## 2021-07-02 NOTE — Progress Notes (Signed)
Tammy Kennedy [place] [address] [place] Phone: [phone] Subjective:   IVilma Kennedy, am serving as a scribe for Dr. Hulan Saas. This visit occurred during the SARS-CoV-2 public health emergency.  Safety protocols were in place, including screening questions prior to the visit, additional usage of staff PPE, and extensive cleaning of exam room while observing appropriate contact time as indicated for disinfecting solutions.   I'm seeing this patient by the request  of:  Binnie Rail, MD  CC: Right leg pain and back pain follow-up  [id number]  05/02/2021 Known degenerative disc disease, history of Crohn's disease, patient also has compression fractures but responded well to conservative therapy.  No significant changes in management with patient doing so well.  Was having still some mild right-sided hip pain.  We discussed potential injections but patient does feel like she is improving.  Patient will follow up again 6 to 8 weeks.  Hopefully at that point we can release her if she is doing well.  Updated 07/02/2021 Tammy Kennedy is a 65 y.o. female coming in with complaint of back pain. Pain in the back has not gotten better. She just finished PT and pain remains the same. Her right leg pain has gotten worse, but she believes it's because she was doing a lot this past week. Pain in leg is sharp then it lingers. No numbness or tingling.  Patient feels like she has plateaued and has not made any significant improvement at this time.  Feels like she is continuing to maybe even potentially worsen again.  Dexa 06/05/2021 the BMD is low according to the [facility] classification for osteoporosis       Past Medical History:  Diagnosis Date   Alcohol abuse 05/14/2016   ALCOHOL ABUSE, HX OF    ANEMIA    ANXIETY DISORDER    Basal cell carcinoma (BCC) 01/10/2018   C. difficile colitis    CROHN'S DISEASE    DEPRESSION    DRY EYE SYNDROME     HEPATOMEGALY, HX OF    HYPERLIPIDEMIA    intol of statin trials   HYPOTHYROIDISM    OSTEOPENIA    Osteoporosis 05/2017   states Pre osteoporosis 8/18   Rectovaginal fistula 2012   chron's disease   Tubular adenoma 2013   Past Surgical History:  Procedure Laterality Date   ANAL FISSURE REPAIR     CATARACT EXTRACTION     EYE SURGERY  11/2017   macular pucker   HEMORRHOID SURGERY     Left thumb surgery  1989   MOHS SURGERY  2008   Nose BCC   Theraputic abortion     Social History   Socioeconomic History   Marital status: Divorced    Spouse name: Not on file   Number of children: Not on file   Years of education: Not on file   Highest education level: Not on file  Occupational History   Not on file  Tobacco Use   Smoking status: Never   Smokeless tobacco: Never  Vaping Use   Vaping Use: Never used  Substance and Sexual Activity   Alcohol use: Yes    Comment: has drank excessively, not drinking daily   Drug use: No   Sexual activity: Not on file  Other Topics Concern   Not on file  Social History Narrative   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  Current Outpatient Medications (Cardiovascular):    fenofibrate 160 MG tablet, Take 1 tablet (160 mg total) by mouth daily.   rosuvastatin (CRESTOR) 5 MG tablet, Take 1 tablet (5 mg total) by mouth daily.    Current Outpatient Medications (Hematological):    folic acid (FOLVITE) 1 MG tablet, Take 2 mg by mouth  daily.   vitamin B-12 (CYANOCOBALAMIN) 500 MCG tablet, Take 500 mcg by mouth daily.  Current Outpatient Medications (Other):    b complex vitamins capsule, Take 1 capsule by mouth daily.   calcium-vitamin D (OSCAL WITH D) 500-200 MG-UNIT tablet, Take 1 tablet by mouth daily.   cholecalciferol (VITAMIN D3) 25 MCG (1000 UNIT) tablet, Take 1,000 Units by mouth daily.   mesalamine (APRISO) 0.375 g 24 hr capsule, Take 3 capsules (1.125 g total) by mouth daily. Take 3 tabs daily for 1 Month, Then take 4 Tabs daily from then on (Patient taking differently: Take 1,125 mg by mouth daily. 4 Tabs daily)   Potassium Gluconate 550 MG TABS, Take 2 tablets by mouth daily.    Sennosides (EX-LAX PO), Take by mouth as directed.   valACYclovir (VALTREX) 500 MG tablet, TAKE 1 TABLET(500 MG) BY MOUTH DAILY   venlafaxine XR (EFFEXOR-XR) 150 MG 24 hr capsule, TAKE 1 CAPSULE BY MOUTH EVERY MORNING WITH BREAKFAST   venlafaxine XR (EFFEXOR-XR) 75 MG 24 hr capsule, TAKE 1 CAPSULE BY MOUTH DAILY WITH BREAKFAST IN ADDITION TO 150 MG DAILY   Reviewed prior external information including notes and imaging from  primary care provider As well as notes that were available from care everywhere and other healthcare systems.  Past medical history, social, surgical and family history all reviewed in electronic medical record.  No pertanent information unless stated regarding to the chief complaint.   Review of Systems:  No headache, visual changes, nausea, vomiting, diarrhea, constipation, dizziness, abdominal pain, skin rash, fevers, chills, night sweats, weight loss, swollen lymph nodes, body aches, joint swelling, chest pain, shortness of breath, mood changes. POSITIVE muscle aches  Objective  Blood pressure 110/80, pulse 100, height 4' 11"  (1.499 m), weight 116 lb (52.6 kg), last menstrual period 10/12/2005, SpO2 96 %.   General: No apparent distress alert and oriented x3 mood and affect normal, dressed appropriately.   HEENT: Pupils equal, extraocular movements intact  Respiratory: Patient's speak in full sentences and does not appear short of breath  Cardiovascular: No lower extremity edema, non tender, no erythema  Gait mild antalgic  MSK: Patient does have weakness in the right hip noted.  Patient only has 2 out of 5  Patient does have some mild limited range of motion but fairly symmetric to the contralateral side.  Mild tenderness of the greater trochanteric area.  significant loss of lordosis of the lumbar spine.  Questionable straight leg test on the right side.  Possible weakness also with 3-5 strength noted of the dorsiflexion of the right foot compared to the left   Impression and Recommendations:    The above documentation has been reviewed  and is accurate and complete Lyndal Pulley, DO

## 2021-07-22 ENCOUNTER — Other Ambulatory Visit (INDEPENDENT_AMBULATORY_CARE_PROVIDER_SITE_OTHER): Payer: Medicare Other

## 2021-07-22 ENCOUNTER — Other Ambulatory Visit: Payer: Self-pay | Admitting: Internal Medicine

## 2021-07-22 ENCOUNTER — Other Ambulatory Visit: Payer: Self-pay

## 2021-07-22 DIAGNOSIS — E038 Other specified hypothyroidism: Secondary | ICD-10-CM | POA: Diagnosis not present

## 2021-07-22 DIAGNOSIS — E782 Mixed hyperlipidemia: Secondary | ICD-10-CM

## 2021-07-22 LAB — LIPID PANEL
Cholesterol: 216 mg/dL — ABNORMAL HIGH (ref 0–200)
HDL: 71.2 mg/dL (ref >39.00)
NonHDL: 144.63
Total CHOL/HDL Ratio: 3
Triglycerides: 230 mg/dL — ABNORMAL HIGH (ref 0.0–149.0)
VLDL: 46 mg/dL — ABNORMAL HIGH (ref 0.0–40.0)

## 2021-07-22 LAB — HEPATIC FUNCTION PANEL
ALT: 28 U/L (ref 0–35)
AST: 32 U/L (ref 0–37)
Albumin: 4 g/dL (ref 3.5–5.2)
Alkaline Phosphatase: 73 U/L (ref 39–117)
Bilirubin, Direct: 0 mg/dL (ref 0.0–0.3)
Total Bilirubin: 0.3 mg/dL (ref 0.2–1.2)
Total Protein: 6.8 g/dL (ref 6.0–8.3)

## 2021-07-22 LAB — TSH: TSH: 0.24 u[IU]/mL — ABNORMAL LOW (ref 0.35–5.50)

## 2021-07-22 LAB — LDL CHOLESTEROL, DIRECT: Direct LDL: 129 mg/dL

## 2021-07-22 MED ORDER — LEVOTHYROXINE SODIUM 75 MCG PO TABS: 75.0000 ug | ORAL_TABLET | Freq: Every day | ORAL | 1 refills | Status: DC

## 2021-07-28 ENCOUNTER — Ambulatory Visit
Admission: RE | Admit: 2021-07-28 | Discharge: 2021-07-28 | Disposition: A | Discharge status: Home / Self Care | Payer: BC Managed Care – PPO | Source: Ambulatory Visit | Attending: Family Medicine | Admitting: Family Medicine

## 2021-07-28 DIAGNOSIS — M160 Bilateral primary osteoarthritis of hip: Secondary | ICD-10-CM | POA: Diagnosis not present

## 2021-07-28 DIAGNOSIS — M48061 Spinal stenosis, lumbar region without neurogenic claudication: Secondary | ICD-10-CM | POA: Diagnosis not present

## 2021-07-28 DIAGNOSIS — M4186 Other forms of scoliosis, lumbar region: Secondary | ICD-10-CM | POA: Diagnosis not present

## 2021-07-28 DIAGNOSIS — M545 Low back pain, unspecified: Secondary | ICD-10-CM | POA: Diagnosis not present

## 2021-07-28 DIAGNOSIS — M25451 Effusion, right hip: Secondary | ICD-10-CM | POA: Diagnosis not present

## 2021-07-28 DIAGNOSIS — M5136 Other intervertebral disc degeneration, lumbar region: Secondary | ICD-10-CM

## 2021-07-28 DIAGNOSIS — M47816 Spondylosis without myelopathy or radiculopathy, lumbar region: Secondary | ICD-10-CM | POA: Diagnosis not present

## 2021-07-28 MED ORDER — GADOBENATE DIMEGLUMINE 529 MG/ML IV SOLN
10.0000 mL | Freq: Once | INTRAVENOUS | Status: AC | PRN
  Administered 2021-07-28: 10 mL via INTRAVENOUS

## 2021-07-31 ENCOUNTER — Other Ambulatory Visit: Payer: Self-pay | Admitting: Internal Medicine

## 2021-08-01 ENCOUNTER — Other Ambulatory Visit: Payer: Self-pay

## 2021-08-01 ENCOUNTER — Other Ambulatory Visit: Payer: Self-pay | Admitting: Family Medicine

## 2021-08-01 ENCOUNTER — Other Ambulatory Visit: Payer: Self-pay | Admitting: Emergency Medicine

## 2021-08-01 ENCOUNTER — Other Ambulatory Visit (INDEPENDENT_AMBULATORY_CARE_PROVIDER_SITE_OTHER): Payer: Medicare Other

## 2021-08-01 ENCOUNTER — Telehealth: Payer: Self-pay | Admitting: Hematology

## 2021-08-01 DIAGNOSIS — M2550 Pain in unspecified joint: Secondary | ICD-10-CM | POA: Diagnosis not present

## 2021-08-01 DIAGNOSIS — S22000A Wedge compression fracture of unspecified thoracic vertebra, initial encounter for closed fracture: Secondary | ICD-10-CM

## 2021-08-01 DIAGNOSIS — S22080S Wedge compression fracture of T11-T12 vertebra, sequela: Secondary | ICD-10-CM

## 2021-08-01 DIAGNOSIS — M1611 Unilateral primary osteoarthritis, right hip: Secondary | ICD-10-CM

## 2021-08-01 LAB — CBC WITH DIFFERENTIAL/PLATELET
Basophils Absolute: 0 10*3/uL (ref 0.0–0.1)
Basophils Relative: 0.9 % (ref 0.0–3.0)
Eosinophils Absolute: 0.1 10*3/uL (ref 0.0–0.7)
Eosinophils Relative: 2 % (ref 0.0–5.0)
HCT: 43.7 % (ref 36.0–46.0)
Hemoglobin: 14.6 g/dL (ref 12.0–15.0)
Lymphocytes Relative: 28.1 % (ref 12.0–46.0)
Lymphs Abs: 1.2 10*3/uL (ref 0.7–4.0)
MCHC: 33.4 g/dL (ref 30.0–36.0)
MCV: 96.5 fl (ref 78.0–100.0)
Monocytes Absolute: 0.5 10*3/uL (ref 0.1–1.0)
Monocytes Relative: 11.4 % (ref 3.0–12.0)
Neutro Abs: 2.5 10*3/uL (ref 1.4–7.7)
Neutrophils Relative %: 57.6 % (ref 43.0–77.0)
Platelets: 290 10*3/uL (ref 150.0–400.0)
RBC: 4.53 Mil/uL (ref 3.87–5.11)
RDW: 12.5 % (ref 11.5–15.5)
WBC: 4.3 10*3/uL (ref 4.0–10.5)

## 2021-08-01 LAB — COMPREHENSIVE METABOLIC PANEL
ALT: 30 U/L (ref 0–35)
AST: 30 U/L (ref 0–37)
Albumin: 4.3 g/dL (ref 3.5–5.2)
Alkaline Phosphatase: 97 U/L (ref 39–117)
BUN: 18 mg/dL (ref 6–23)
CO2: 28 mEq/L (ref 19–32)
Calcium: 10 mg/dL (ref 8.4–10.5)
Chloride: 102 mEq/L (ref 96–112)
Creatinine, Ser: 0.77 mg/dL (ref 0.40–1.20)
GFR: 81.09 mL/min (ref >60.00)
Glucose, Bld: 98 mg/dL (ref 70–99)
Potassium: 4.4 mEq/L (ref 3.5–5.1)
Sodium: 138 mEq/L (ref 135–145)
Total Bilirubin: 0.5 mg/dL (ref 0.2–1.2)
Total Protein: 7.4 g/dL (ref 6.0–8.3)

## 2021-08-01 LAB — MAGNESIUM: Magnesium: 1.9 mg/dL (ref 1.5–2.5)

## 2021-08-01 LAB — IBC PANEL
Iron: 119 ug/dL (ref 42–145)
Saturation Ratios: 26.6 % (ref 20.0–50.0)
TIBC: 448 ug/dL (ref 250.0–450.0)
Transferrin: 320 mg/dL (ref 212.0–360.0)

## 2021-08-01 LAB — SEDIMENTATION RATE: Sed Rate: 21 mm/hr (ref 0–30)

## 2021-08-01 LAB — TSH: TSH: 0.31 u[IU]/mL — ABNORMAL LOW (ref 0.35–5.50)

## 2021-08-01 LAB — VITAMIN D 25 HYDROXY (VIT D DEFICIENCY, FRACTURES): VITD: 29.52 ng/mL — ABNORMAL LOW (ref 30.00–100.00)

## 2021-08-01 NOTE — Telephone Encounter (Signed)
Scheduled appt per 10/21 referral. Pt is aware of appt date and time.

## 2021-08-02 LAB — LACTATE DEHYDROGENASE: LDH: 137 U/L (ref 120–250)

## 2021-08-05 ENCOUNTER — Encounter: Payer: Self-pay | Admitting: *Deleted

## 2021-08-05 ENCOUNTER — Other Ambulatory Visit: Payer: Self-pay | Admitting: Family Medicine

## 2021-08-05 ENCOUNTER — Ambulatory Visit
Admission: RE | Admit: 2021-08-05 | Discharge: 2021-08-05 | Disposition: A | Discharge status: Home / Self Care | Payer: BC Managed Care – PPO | Source: Ambulatory Visit | Attending: Family Medicine | Admitting: Family Medicine

## 2021-08-05 ENCOUNTER — Other Ambulatory Visit: Payer: Self-pay

## 2021-08-05 ENCOUNTER — Telehealth: Payer: Self-pay | Admitting: Hematology

## 2021-08-05 DIAGNOSIS — S22000A Wedge compression fracture of unspecified thoracic vertebra, initial encounter for closed fracture: Secondary | ICD-10-CM

## 2021-08-05 DIAGNOSIS — S22080A Wedge compression fracture of T11-T12 vertebra, initial encounter for closed fracture: Secondary | ICD-10-CM | POA: Diagnosis not present

## 2021-08-05 DIAGNOSIS — S22080S Wedge compression fracture of T11-T12 vertebra, sequela: Secondary | ICD-10-CM

## 2021-08-05 HISTORY — PX: IR RADIOLOGIST EVAL & MGMT: IMG5224

## 2021-08-05 NOTE — Telephone Encounter (Signed)
Returned call to reschedule upcoming appointment per patient's request. Left message to call back to reschedule.

## 2021-08-05 NOTE — Consult Note (Addendum)
Chief Complaint: Patient was seen in consultation today for thoracic compression fracture deformity at the request of Smith,Zachary M  Referring Physician(s): Smith,Zachary M  History of Present Illness: Tammy Kennedy is a 65 y.o. female with a history of Crohn's disease, history of long-term steroid use, who Complains of low back and right hip pain for about 1 year, with some weakness and numbness.  No discrete inciting event or fall. 03/27/2021 lumbar spine radiographs demonstrated focal lumbar levoscoliosis with wedge deformities of T12 and L1.  These were not present on previous CT abdomen 08/30/2006. 07/02/2021 hip radiographs demonstrate moderately severe right hip DJD 07/28/2021 MR lumbar spine demonstrates Acute/subacute T12 compression fracture with 75% height loss. Possible underlying 1.2 cm marrow lesion which could indicate a pathologic fracture from multiple myeloma or metastatic disease. Lumbar levoscoliosis with diffuse disc and facet degeneration, most notable at L3-4 where there is moderate spinal stenosis. MR pelvis confirms Severe right and mild left degenerative hip arthropathy. Borderline right hip joint effusion. Scattered sigmoid colon diverticula.  Currently she is not having much in the way of back pain.  She is not taking any pain medications.  Past Medical History:  Diagnosis Date   Alcohol abuse 05/14/2016   ALCOHOL ABUSE, HX OF    ANEMIA    ANXIETY DISORDER    Basal cell carcinoma (BCC) 01/10/2018   C. difficile colitis    CROHN'S DISEASE    DEPRESSION    DRY EYE SYNDROME    HEPATOMEGALY, HX OF    HYPERLIPIDEMIA    intol of statin trials   HYPOTHYROIDISM    OSTEOPENIA    Osteoporosis 05/2017   states Pre osteoporosis 8/18   Rectovaginal fistula 2012   chron's disease   Tubular adenoma 2013    Past Surgical History:  Procedure Laterality Date   ANAL FISSURE REPAIR     CATARACT EXTRACTION     EYE SURGERY  11/2017   macular pucker    HEMORRHOID SURGERY     IR RADIOLOGIST EVAL & MGMT  08/05/2021   Left thumb surgery  1989   MOHS SURGERY  2008   Nose BCC   Theraputic abortion      Allergies: Patient has no known allergies.  Medications: Prior to Admission medications   Medication Sig Start Date End Date Taking? Authorizing Provider  b complex vitamins capsule Take 1 capsule by mouth daily. 06/10/21   Binnie Rail, MD  calcium-vitamin D (OSCAL WITH D) 500-200 MG-UNIT tablet Take 1 tablet by mouth daily.    [provider]  cholecalciferol (VITAMIN D3) 25 MCG (1000 UNIT) tablet Take 1,000 Units by mouth daily.    [provider]  fenofibrate 160 MG tablet Take 1 tablet (160 mg total) by mouth daily. 06/05/20   Binnie Rail, MD  folic acid (FOLVITE) 1 MG tablet Take 2 mg by mouth daily.    [provider]  levothyroxine (SYNTHROID) 75 MCG tablet Take 1 tablet (75 mcg total) by mouth daily. Take 6 days a week only. 07/22/21   Binnie Rail, MD  mesalamine (APRISO) 0.375 g 24 hr capsule Take 3 capsules (1.125 g total) by mouth daily. Take 3 tabs daily for 1 Month, Then take 4 Tabs daily from then on Patient taking differently: Take 1,125 mg by mouth daily. 4 Tabs daily 05/01/21   Mauri Pole, MD  Potassium Gluconate 550 MG TABS Take 2 tablets by mouth daily.  [provider]  rosuvastatin (CRESTOR) 5 MG tablet Take 1 tablet (5 mg total) by mouth daily. 06/14/21   Binnie Rail, MD  Sennosides (EX-LAX PO) Take by mouth as directed.    [provider]  valACYclovir (VALTREX) 500 MG tablet TAKE 1 TABLET(500 MG) BY MOUTH DAILY 05/14/21   Binnie Rail, MD  venlafaxine XR (EFFEXOR-XR) 150 MG 24 hr capsule TAKE 1 CAPSULE BY MOUTH EVERY MORNING WITH BREAKFAST 02/17/21   Binnie Rail, MD  venlafaxine XR (EFFEXOR-XR) 75 MG 24 hr capsule TAKE 1 CAPSULE BY MOUTH DAILY WITH BREAKFAST IN ADDITION TO 150 MG DAILY 08/01/21   Burns, Claudina Lick, MD  vitamin B-12 (CYANOCOBALAMIN) 500 MCG  tablet Take 500 mcg by mouth daily.    [provider]     Family History  Adopted: Yes  Problem Relation Age of Onset   Breast cancer Neg Hx     Social History   Socioeconomic History   Marital status: Divorced    Spouse name: Not on file   Number of children: Not on file   Years of education: Not on file   Highest education level: Not on file  Occupational History   Not on file  Tobacco Use   Smoking status: Never   Smokeless tobacco: Never  Vaping Use   Vaping Use: Never used  Substance and Sexual Activity   Alcohol use: Yes    Comment: has drank excessively, not drinking daily   Drug use: No   Sexual activity: Not on file  Other Topics Concern   Not on file  Social History Narrative   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  All other systems are negative.  Review of Systems  Vital Signs: LMP 10/12/2005 (LMP Unknown)   Physical Exam Constitutional: Oriented to person, place, and time. Well-developed and well-nourished. No distress.   HENT:  Head: Normocephalic and atraumatic.  Eyes: Conjunctivae and EOM are normal. Right eye exhibits no discharge. Left eye exhibits no discharge. No scleral icterus.  Neck: No JVD present.  Pulmonary/Chest: Effort normal. No stridor. No respiratory distress.  Abdomen: soft, non distended Neurological:  alert and oriented to person, place, and time.  Skin: Skin is warm and dry.  not diaphoretic.  Psychiatric:   normal mood and affect.   behavior is normal. Judgment and thought content normal.          Imaging: MR LUMBAR SPINE WO CONTRAST  Result Date: 07/29/2021 CLINICAL DATA:  Low back pain radiating to the right groin and hip and down the leg for 1 year with weakness and numbness. EXAM: MRI LUMBAR SPINE WITHOUT CONTRAST TECHNIQUE: Multiplanar, multisequence MR imaging of the lumbar spine was performed. No intravenous contrast was administered. COMPARISON:  Lumbar spine radiograph 03/27/2021 FINDINGS: Segmentation:  Standard. Alignment: Moderate to severe levoscoliosis with apex at L2-3. No significant listhesis in the sagittal plane. Vertebrae: T12 compression fracture with 75% vertebral body height loss and moderate marrow edema extending into the posterior elements. L1 compression fracture with 55% height loss and at most minimal  endplate edema, largely chronic in appearance. Mild chronic L5 vertebral body height loss. Mildly heterogeneous bone marrow signal diffusely as well as a more focal and rounded 1.2 cm region of T1 hypointensity in the T12 vertebral body. Conus medullaris and cauda equina: Conus extends to the L1 level. Conus and cauda equina appear normal. Paraspinal and other soft tissues: 1.9 cm cyst in the right kidney. Disc levels: Disc desiccation throughout the lumbar and included lower thoracic spine with exception of L5-S1. Multilevel disc space narrowing, severe at L2-3. T11-12: Minimal disc bulging, endplate spurring, and asymmetrically severe left facet hypertrophy without stenosis. T12-L1: Disc bulging, endplate spurring, and mild facet hypertrophy without significant stenosis. L1-2: Disc bulging, endplate spurring, and mild facet and ligamentum flavum hypertrophy without significant stenosis. L2-3: Disc bulging, endplate spurring, and mild facet and ligamentum flavum hypertrophy without significant stenosis. L3-4: Disc bulging, endplate spurring, and moderate facet and ligamentum flavum hypertrophy result in moderate spinal stenosis, mild bilateral lateral recess stenosis, and  mild right neural foraminal stenosis. L4-5: Minimal disc bulging and mild-to-moderate facet and ligamentum flavum hypertrophy result in mild left greater than right lateral recess stenosis and borderline spinal stenosis without neural foraminal stenosis. L5-S1: Minimal disc bulging and moderate facet and ligamentum flavum hypertrophy result in borderline to mild bilateral lateral recess stenosis without spinal or neural foraminal stenosis. IMPRESSION: 1. Acute/subacute T12 compression fracture with 75% height loss. Possible underlying 1.2 cm marrow lesion which could indicate a pathologic fracture from multiple myeloma or metastatic disease. 2. Lumbar levoscoliosis with diffuse disc and facet degeneration, most notable at L3-4 where there is moderate spinal stenosis. Electronically Signed   By: Logan Bores M.D.   On: 07/29/2021 19:41   MR PELVIS W WO CONTRAST  Result Date: 07/30/2021 CLINICAL DATA:  Low back pain radiating to the right groin and hip. EXAM: MRI PELVIS WITHOUT AND WITH CONTRAST TECHNIQUE: Multiplanar multisequence MR imaging of the pelvis was performed both before and after administration of intravenous contrast. CONTRAST:  60m MULTIHANCE GADOBENATE DIMEGLUMINE 529 MG/ML IV SOLN COMPARISON:  Radiographs 07/02/2021 and CT pelvis 08/30/2006 FINDINGS: Urinary Tract:  Unremarkable Bowel: Scattered sigmoid colon diverticula without findings of active diverticulitis. Vascular/Lymphatic: No pathologic adenopathy. Today's exam was not performed as an MR angiogram. Reproductive:  Unremarkable Other:  No supplemental non-categorized findings. Musculoskeletal: Severe osteoarthritis of the right hip with prominent femoral head and acetabular spurring, prominent craniocaudad loss of articular cartilage, subcortical marrow edema, and small confluent subcortical cystic lesions along the right femoral head. There is mild to moderate left hip osteoarthritis. Borderline right hip joint effusion. SI joints  unremarkable. No other significant abnormal marrow edema or marrow enhancement. Musculotendinous structures unremarkable. The pubis appears unremarkable. There is evidence of levoconvex lumbar scoliosis with spondylosis and degenerative disc disease better depicted on the dedicated lumbar spine MRI. Sacrum and coccyx unremarkable. IMPRESSION: 1. Severe right and mild left degenerative hip arthropathy. Borderline right hip joint effusion. 2. Scattered sigmoid colon diverticula. Electronically Signed   By: WVan ClinesM.D.   On: 07/30/2021 08:35   IR Radiologist Eval & Mgmt  Result Date: 08/05/2021 Please refer to notes tab for details about interventional procedure. (Op Note)   Labs:  CBC: Recent Labs    10/15/20 1022 03/27/21 1452 06/10/21 1557 08/01/21 1142  WBC 3.0* 5.9 4.6 4.3  HGB 13.4 12.9 13.5 14.6  HCT 39.1 37.8 40.4 43.7  PLT 268.0 336.0 315.0 290.0    COAGS: No results for input(s): INR, APTT in the last 8760  hours.  BMP: Recent Labs    10/15/20 1022 03/27/21 1452 06/10/21 1557 08/01/21 1142  NA 140 140 139 138  K 4.3 4.2 3.8 4.4  CL 105 103 104 102  CO2 _0 GLUCOSE 97 95 84 98  BUN 20 26* 24* 18  CALCIUM 10.3 11.3*  11.2* 10.6*  10.7* 10.0  CREATININE 0.86 0.93 0.90 0.77    LIVER FUNCTION TESTS: Recent Labs    03/27/21 1452 06/10/21 1557 07/22/21 0958 08/01/21 1142  BILITOT 0.3 0.2 0.3 0.5  AST 18 31 32 30  ALT _1 ALKPHOS 68 52 73 97  PROT 7.1 7.2 6.8 7.4  ALBUMIN 4.3 4.2 4.0 4.3    TUMOR MARKERS: No results for input(s): AFPTM, CEA, CA199, CHROMGRNA in the last 8760 hours.  Assessment and Plan:  My impression is that this patient has a subacute unhealed T12 compression fracture deformity, of indeterminate etiology.  Risk factors include scoliosis and chronic steroid use.  She is having minimal pain at  this level, requiring no significant pain medication regimen, and would thus NOT  be a candidate for percutaneous  cement vertebral augmentation.  However, with question of possible possible pathologic etiology with possible lesion seen in the mid body of T12, I would recommend percutaneous core bone biopsy for further assessment. I discussed with the patient  the pathophysiology of vertebral compression fracture deformities; the stable nature of these which does not require emergent treatment; natural history which includes healing over some unpredictable number of months.  We discussed his elevated risk of additional level fractures with or without vertebral augmentation.  We discussed the long-term need for continued bone building therapy managed by the patient's PCP.   She  seemed to understand, and did ask appropriate questions. The patient is motivated to proceed with  evaluation of the possible lesion.  Accordingly, we schedule percutaneous thoracic T12 core bone biopsy under moderate sedation   as an outpatient at the patient's convenience, pending carrier approval if needed.   Thank you for this interesting consult.  I greatly enjoyed meeting KENNITA PAVLOVICH and look forward to participating in their care.  A copy of this report was sent to the requesting provider on this date.  Electronically Signed: Rickard Rhymes 08/05/2021, 2:28 PM   I spent a total of  30 Minutes   in face to face in clinical consultation, greater than 50% of which was counseling/coordinating care for subacute T12 compression fracture.

## 2021-08-06 LAB — PROTEIN ELECTROPHORESIS, SERUM
Albumin ELP: 4.4 g/dL (ref 3.8–4.8)
Alpha 1: 0.3 g/dL (ref 0.2–0.3)
Alpha 2: 1 g/dL — ABNORMAL HIGH (ref 0.5–0.9)
Beta 2: 0.4 g/dL (ref 0.2–0.5)
Beta Globulin: 0.5 g/dL (ref 0.4–0.6)
Gamma Globulin: 0.9 g/dL (ref 0.8–1.7)
Total Protein: 7.4 g/dL (ref 6.1–8.1)

## 2021-08-06 LAB — PTH, INTACT AND CALCIUM
Calcium: 9.9 mg/dL (ref 8.6–10.4)
PTH: 62 pg/mL (ref 16–77)

## 2021-08-06 LAB — GLUCOSE 6 PHOSPHATE DEHYDROGENASE: G-6PDH: 14.6 U/g Hgb (ref 7.0–20.5)

## 2021-08-06 LAB — CALCIUM, IONIZED: Calcium, Ion: 5.44 mg/dL (ref 4.8–5.6)

## 2021-08-07 DIAGNOSIS — M1611 Unilateral primary osteoarthritis, right hip: Secondary | ICD-10-CM | POA: Diagnosis not present

## 2021-08-09 LAB — IMMUNOGLOBULINS A/E/G/M, SERUM
IgA/Immunoglobulin A, Serum: 122 mg/dL (ref 87–352)
IgE (Immunoglobulin E), Serum: 3 IU/mL — ABNORMAL LOW (ref 6–495)
IgG (Immunoglobin G), Serum: 940 mg/dL (ref 586–1602)
IgM (Immunoglobulin M), Srm: 77 mg/dL (ref 26–217)

## 2021-08-11 ENCOUNTER — Other Ambulatory Visit (HOSPITAL_COMMUNITY)
Admission: RE | Admit: 2021-08-11 | Discharge: 2021-08-11 | Disposition: A | Discharge status: Home / Self Care | Payer: Medicare Other | Source: Ambulatory Visit | Attending: Interventional Radiology | Admitting: Interventional Radiology

## 2021-08-11 ENCOUNTER — Ambulatory Visit
Admission: RE | Admit: 2021-08-11 | Discharge: 2021-08-11 | Disposition: A | Discharge status: Home / Self Care | Payer: BC Managed Care – PPO | Source: Ambulatory Visit | Attending: Family Medicine | Admitting: Family Medicine

## 2021-08-11 ENCOUNTER — Other Ambulatory Visit: Payer: Self-pay

## 2021-08-11 ENCOUNTER — Ambulatory Visit
Admission: RE | Admit: 2021-08-11 | Discharge: 2021-08-11 | Disposition: A | Discharge status: Home / Self Care | Payer: Medicare Other | Source: Ambulatory Visit | Attending: Family Medicine | Admitting: Family Medicine

## 2021-08-11 DIAGNOSIS — S22080A Wedge compression fracture of T11-T12 vertebra, initial encounter for closed fracture: Secondary | ICD-10-CM | POA: Diagnosis not present

## 2021-08-11 DIAGNOSIS — S22080S Wedge compression fracture of T11-T12 vertebra, sequela: Secondary | ICD-10-CM

## 2021-08-11 DIAGNOSIS — M898X9 Other specified disorders of bone, unspecified site: Secondary | ICD-10-CM | POA: Diagnosis not present

## 2021-08-11 DIAGNOSIS — M4854XA Collapsed vertebra, not elsewhere classified, thoracic region, initial encounter for fracture: Secondary | ICD-10-CM | POA: Insufficient documentation

## 2021-08-11 DIAGNOSIS — C9000 Multiple myeloma not having achieved remission: Secondary | ICD-10-CM | POA: Diagnosis not present

## 2021-08-11 DIAGNOSIS — M8458XA Pathological fracture in neoplastic disease, other specified site, initial encounter for fracture: Secondary | ICD-10-CM | POA: Diagnosis not present

## 2021-08-11 MED ORDER — KETOROLAC TROMETHAMINE 30 MG/ML IJ SOLN
30.0000 mg | Freq: Once | INTRAMUSCULAR | Status: AC
  Administered 2021-08-11: 30 mg via INTRAVENOUS

## 2021-08-11 MED ORDER — SODIUM CHLORIDE 0.9 % IV SOLN: INTRAVENOUS | Status: DC

## 2021-08-11 MED ORDER — FENTANYL CITRATE PF 50 MCG/ML IJ SOSY
25.0000 ug | PREFILLED_SYRINGE | INTRAMUSCULAR | Status: DC | PRN
Start: 2021-08-11 — End: 2021-08-12
  Administered 2021-08-11 (×2): 25 ug via INTRAVENOUS
  Administered 2021-08-11: 50 ug via INTRAVENOUS

## 2021-08-11 MED ORDER — MIDAZOLAM HCL 2 MG/2ML IJ SOLN
1.0000 mg | INTRAMUSCULAR | Status: DC | PRN
  Administered 2021-08-11: 0.5 mg via INTRAVENOUS
  Administered 2021-08-11: 1 mg via INTRAVENOUS

## 2021-08-11 NOTE — Discharge Instructions (Signed)
Bone Biopsy Post Procedure Discharge Instructions  May resume a regular diet and any medications that you routinely take (including pain medications). No driving day of procedure. Upon discharge go home and rest for at least 4 hours.  May use an ice pack as needed to injection sites on back. Remove bandades later, today.    Please contact our office at [phone] for the following symptoms:  Fever greater than 100 degrees Increased swelling, pain, or redness at injection site.   Thank you for visiting [facility].

## 2021-08-11 NOTE — Discharge Instructions (Signed)
Bone Biopsy Post Procedure Discharge Instructions  May resume a regular diet and any medications that you routinely take (including pain medications). No driving day of procedure. Upon discharge go home and rest for at least 4 hours.  May use an ice pack as needed to injection sites on back. Remove bandades later, today. Do not submerge yourself in any water for 2-3 days following procedure. Follow up with your ordering doctor within 2 weeks.     Please contact our office at [phone] for the following symptoms:  Fever greater than 100 degrees Increased swelling, pain, or redness at injection site.   Thank you for visiting [facility].

## 2021-08-11 NOTE — Progress Notes (Signed)
Pt back in nursing recovery area. Pt still drowsy from procedure but will wake up when spoken to. Pt follows commands, talks in complete sentences and has no complaints at this time. Pt will be monitored until discharged by Radiologist.

## 2021-08-12 ENCOUNTER — Inpatient Hospital Stay: Payer: Medicare Other | Attending: Hematology | Admitting: Hematology

## 2021-08-12 ENCOUNTER — Inpatient Hospital Stay: Payer: Medicare Other

## 2021-08-12 VITALS — BP 129/90 | HR 83 | Temp 98.5°F | Resp 17 | Ht 59.0 in | Wt 120.1 lb

## 2021-08-12 DIAGNOSIS — M4854XA Collapsed vertebra, not elsewhere classified, thoracic region, initial encounter for fracture: Secondary | ICD-10-CM | POA: Diagnosis not present

## 2021-08-12 DIAGNOSIS — M899 Disorder of bone, unspecified: Secondary | ICD-10-CM | POA: Diagnosis not present

## 2021-08-12 DIAGNOSIS — E039 Hypothyroidism, unspecified: Secondary | ICD-10-CM | POA: Insufficient documentation

## 2021-08-12 DIAGNOSIS — M545 Low back pain, unspecified: Secondary | ICD-10-CM | POA: Insufficient documentation

## 2021-08-12 DIAGNOSIS — M818 Other osteoporosis without current pathological fracture: Secondary | ICD-10-CM

## 2021-08-12 DIAGNOSIS — K5090 Crohn's disease, unspecified, without complications: Secondary | ICD-10-CM | POA: Insufficient documentation

## 2021-08-12 LAB — CBC WITH DIFFERENTIAL/PLATELET
Abs Immature Granulocytes: 0.01 10*3/uL (ref 0.00–0.07)
Basophils Absolute: 0.1 10*3/uL (ref 0.0–0.1)
Basophils Relative: 1 %
Eosinophils Absolute: 0.1 10*3/uL (ref 0.0–0.5)
Eosinophils Relative: 2 %
HCT: 39.1 % (ref 36.0–46.0)
Hemoglobin: 13.3 g/dL (ref 12.0–15.0)
Immature Granulocytes: 0 %
Lymphocytes Relative: 22 %
Lymphs Abs: 1 10*3/uL (ref 0.7–4.0)
MCH: 32.1 pg (ref 26.0–34.0)
MCHC: 34 g/dL (ref 30.0–36.0)
MCV: 94.4 fL (ref 80.0–100.0)
Monocytes Absolute: 0.4 10*3/uL (ref 0.1–1.0)
Monocytes Relative: 9 %
Neutro Abs: 3 10*3/uL (ref 1.7–7.7)
Neutrophils Relative %: 66 %
Platelets: 249 10*3/uL (ref 150–400)
RBC: 4.14 MIL/uL (ref 3.87–5.11)
RDW: 12.1 % (ref 11.5–15.5)
WBC: 4.6 10*3/uL (ref 4.0–10.5)
nRBC: 0 % (ref 0.0–0.2)

## 2021-08-12 LAB — CMP (CANCER CENTER ONLY)
ALT: 25 U/L (ref 0–44)
AST: 25 U/L (ref 15–41)
Albumin: 3.8 g/dL (ref 3.5–5.0)
Alkaline Phosphatase: 92 U/L (ref 38–126)
Anion gap: 6 (ref 5–15)
BUN: 19 mg/dL (ref 8–23)
CO2: 27 mmol/L (ref 22–32)
Calcium: 9.3 mg/dL (ref 8.9–10.3)
Chloride: 107 mmol/L (ref 98–111)
Creatinine: 0.75 mg/dL (ref 0.44–1.00)
GFR, Estimated: >60 mL/min (ref >60)
Glucose, Bld: 85 mg/dL (ref 70–99)
Potassium: 4.2 mmol/L (ref 3.5–5.1)
Sodium: 140 mmol/L (ref 135–145)
Total Bilirubin: 0.4 mg/dL (ref 0.3–1.2)
Total Protein: 6.9 g/dL (ref 6.5–8.1)

## 2021-08-12 LAB — SEDIMENTATION RATE: Sed Rate: 15 mm/hr (ref 0–22)

## 2021-08-12 LAB — LACTATE DEHYDROGENASE: LDH: 159 U/L (ref 98–192)

## 2021-08-12 NOTE — Progress Notes (Addendum)
Marland Kitchen   HEMATOLOGY/ONCOLOGY CONSULTATION NOTE  Date of Service: 08/12/2021  Patient Care Team: Binnie Rail, MD as PCP - General (Internal Medicine) Lafayette Dragon, MD (Inactive) as Referring Physician (Gastroenterology)  CHIEF COMPLAINTS/PURPOSE OF CONSULTATION:  Hypercalcemia and T12 compression fracture with possible underlying bone marrow lesion rule out multiple myeloma.  HISTORY OF PRESENTING ILLNESS:   Tammy Kennedy is a wonderful 65 y.o. female who has been referred to [place] by Dr .Quay Burow, Claudina Lick, MD  for evaluation and management of T12 pathologic compression fracture with 1.2 cm marrow lesion associated with hypercalcemia rule out multiple myeloma.  Patient has history of Crohn's disease, hypothyroidism, dyslipidemia, alcohol abuse, osteoporosis who presented with mid to lower back pain for many months.  She had a x-ray of the lumbar spine on 03/27/2021 which showed Scoliosis with rotatory component. Age uncertain anterior wedging at T12 and L1 with milder anterior wedging at L3 and L5. No increasing spondylolisthesis. Multilevel osteoarthritic change noted.  She had a DEXA scan on 06/05/2021 with L-spine T score of -2.0, right femoral neck -1.3 and left femoral neck -2.2. This was in the osteopenia range but with her compression fracture in the spine would likely qualify as osteoporosis.  Patient continues to have back pain and had an MRI of her lumbar spine without contrast on 07/28/2021 which showed Acute/subacute T12 compression fracture with 75% height loss.Possible underlying 1.2 cm marrow lesion which could indicate a pathologic fracture from multiple myeloma or metastatic disease.  Lumbar levoscoliosis with diffuse disc and facet degeneration, most notable at L3-4 where there is moderate spinal stenosis.  Patient was referred to interventional radiology and had a CT-guided deep bone core biopsy of T12 vertebral body on 08/11/2021 the pathology from which is pending at this  time.  Labs done by her primary care physician on 08/01/2021 showed normal CBC with a hemoglobin of 14.6 WBC count of 4.3k and platelets of 290k CMP in August showed some hypercalcemia up to 11.3 but on repeat labs on 08/01/2021 CMP is within normal limits with a calcium of 10 PTH 62 TSH is 0.31 LDH 137 Sedimentation rate within normal limits at 21 Serum protein electrophoresis did not report any monoclonal protein spike.  Patient was referred to [place] for further evaluation of this bone marrow lesion and our hypercalcemia (which has now resolved).  Patient notes no other focal bone pain at this time.  No fevers no chills no night sweats no unexpected weight loss. No other acute new focal lesions.   She reports that her Crohn's disease is well controlled with mesalamine and she notes no history of seronegative arthritis related to this.   MEDICAL HISTORY:  Past Medical History:  Diagnosis Date   Alcohol abuse 05/14/2016   ALCOHOL ABUSE, HX OF    ANEMIA    ANXIETY DISORDER    Basal cell carcinoma (BCC) 01/10/2018   C. difficile colitis    CROHN'S DISEASE    DEPRESSION    DRY EYE SYNDROME    HEPATOMEGALY, HX OF    HYPERLIPIDEMIA    intol of statin trials   HYPOTHYROIDISM    OSTEOPENIA    Osteoporosis 05/2017   states Pre osteoporosis 8/18   Rectovaginal fistula 2012   chron's disease   Tubular adenoma 2013    SURGICAL HISTORY: Past Surgical History:  Procedure Laterality Date   ANAL FISSURE REPAIR     CATARACT EXTRACTION     EYE SURGERY  11/2017   macular pucker  HEMORRHOID SURGERY     IR RADIOLOGIST EVAL & MGMT  08/05/2021   Left thumb surgery  1989   MOHS SURGERY  2008   Nose BCC   Theraputic abortion      SOCIAL HISTORY: Social History   Socioeconomic History   Marital status: Divorced    Spouse name: Not on file   Number of children: Not on file   Years of education: Not on file   Highest education level: Not on file  Occupational History   Not on file   Tobacco Use   Smoking status: Never   Smokeless tobacco: Never  Vaping Use   Vaping Use: Never used  Substance and Sexual Activity   Alcohol use: Yes    Comment: has drank excessively, not drinking daily   Drug use: No   Sexual activity: Not on file  Other Topics Concern   Not on file  Social History Narrative   Works at [employer]- part time   Social Determinants of Radio broadcast assistant Strain: Not on file  Food Insecurity: Not on file  Transportation Needs: Not on file  Physical Activity: Not on file  Stress: Not on file  Social Connections: Not on file  Intimate Partner Violence: Not on file    FAMILY HISTORY: Family History  Adopted: Yes  Problem Relation Age of Onset   Breast cancer Neg Hx     ALLERGIES:  has No Known Allergies.  MEDICATIONS:  Current Outpatient Medications  Medication Sig Dispense Refill   b complex vitamins capsule Take 1 capsule by mouth daily.     calcium-vitamin D (OSCAL WITH D) 500-200 MG-UNIT tablet Take 1 tablet by mouth daily.     cholecalciferol (VITAMIN D3) 25 MCG (1000 UNIT) tablet Take 1,000 Units by mouth daily.     fenofibrate 160 MG tablet Take 1 tablet (160 mg total) by mouth daily. 90 tablet 3   folic acid (FOLVITE) 1 MG tablet Take 2 mg by mouth daily.     levothyroxine (SYNTHROID) 75 MCG tablet Take 1 tablet (75 mcg total) by mouth daily. Take 6 days a week only. 90 tablet 1   mesalamine (APRISO) 0.375 g 24 hr capsule Take 3 capsules (1.125 g total) by mouth daily. Take 3 tabs daily for 1 Month, Then take 4 Tabs daily from then on (Patient taking differently: Take 1,125 mg by mouth daily. 4 Tabs daily) 90 capsule 7   Potassium Gluconate 550 MG TABS Take 2 tablets by mouth daily.      rosuvastatin (CRESTOR) 5 MG tablet Take 1 tablet (5 mg total) by mouth daily. 90 tablet 3   Sennosides (EX-LAX PO) Take by mouth as directed.     valACYclovir (VALTREX) 500 MG tablet TAKE 1 TABLET(500 MG) BY  MOUTH DAILY 90 tablet 1   venlafaxine XR (EFFEXOR-XR) 150 MG 24 hr capsule TAKE 1 CAPSULE BY MOUTH EVERY MORNING WITH BREAKFAST 90 capsule 1   venlafaxine XR (EFFEXOR-XR) 75 MG 24 hr capsule TAKE 1 CAPSULE BY MOUTH DAILY WITH BREAKFAST IN ADDITION TO 150 MG DAILY 90 capsule 0   vitamin B-12 (CYANOCOBALAMIN) 500 MCG tablet Take 500 mcg by mouth daily.     No current facility-administered medications for this visit.    REVIEW OF SYSTEMS:    10 Point review of Systems was done is negative except as noted above.  PHYSICAL EXAMINATION: ECOG PERFORMANCE STATUS:    . Vitals:   08/12/21 1107  BP: 129/90  Pulse: 83  Resp: 17  Temp: 98.5 F (36.9 C)  SpO2: 98%   Filed Weights   08/12/21 1107  Weight: 120 lb 1.6 oz (54.5 kg)   .Body mass index is 24.26 kg/m.  GENERAL:alert, in no acute distress and comfortable SKIN: no acute rashes, no significant lesions EYES: conjunctiva are pink and non-injected, sclera anicteric OROPHARYNX: MMM, no exudates, no oropharyngeal erythema or ulceration NECK: supple, no JVD LYMPH:  no palpable lymphadenopathy in the cervical, axillary or inguinal regions LUNGS: clear to auscultation b/l with normal respiratory effort HEART: regular rate & rhythm ABDOMEN:  normoactive bowel sounds , non tender, not distended. Extremity: no pedal edema PSYCH: alert & oriented x 3 with fluent speech NEURO: no focal motor/sensory deficits  LABORATORY DATA:  I have reviewed the data as listed  . CBC Latest Ref Rng & Units 08/01/2021 06/10/2021 03/27/2021  WBC 4.0 - 10.5 K/uL 4.3 4.6 5.9  Hemoglobin 12.0 - 15.0 g/dL 14.6 13.5 12.9  Hematocrit 36.0 - 46.0 % 43.7 40.4 37.8  Platelets 150.0 - 400.0 K/uL 290.0 315.0 336.0    . CMP Latest Ref Rng & Units 08/01/2021 08/01/2021 07/22/2021  Glucose 70 - 99 mg/dL - 98 -  BUN 6 - 23 mg/dL - 18 -  Creatinine 0.40 - 1.20 mg/dL - 0.77 -  Sodium 135 - 145 mEq/L - 138 -  Potassium 3.5 - 5.1 mEq/L - 4.4 -  Chloride 96 -  112 mEq/L - 102 -  CO2 19 - 32 mEq/L - 28 -  Calcium 8.6 - 10.4 mg/dL 9.9 10.0 -  Total Protein 6.1 - 8.1 g/dL 7.4 7.4 6.8  Total Bilirubin 0.2 - 1.2 mg/dL - 0.5 0.3  Alkaline Phos 39 - 117 U/L - 97 73  AST 0 - 37 U/L - 30 32  ALT 0 - 35 U/L - 30 28     RADIOGRAPHIC STUDIES: I have personally reviewed the radiological images as listed and agreed with the findings in the report. MR LUMBAR SPINE WO CONTRAST  Result Date: 07/29/2021 CLINICAL DATA:  Low back pain radiating to the right groin and hip and down the leg for 1 year with weakness and numbness. EXAM: MRI LUMBAR SPINE WITHOUT CONTRAST TECHNIQUE: Multiplanar, multisequence MR imaging of the lumbar spine was performed. No intravenous contrast was administered. COMPARISON:  Lumbar spine radiograph 03/27/2021 FINDINGS: Segmentation:  Standard. Alignment: Moderate to severe levoscoliosis with apex at L2-3. No significant listhesis in the sagittal plane. Vertebrae: T12 compression fracture with 75% vertebral body height loss and moderate marrow edema extending into the posterior elements. L1 compression fracture with 55% height loss and at most minimal endplate edema, largely chronic in appearance. Mild chronic L5 vertebral body height loss. Mildly heterogeneous bone marrow signal diffusely as well as a more focal and rounded 1.2 cm region of T1 hypointensity in the T12 vertebral body. Conus medullaris and cauda equina: Conus extends to the L1 level. Conus and cauda equina appear normal. Paraspinal and other soft tissues: 1.9 cm cyst in the right kidney. Disc levels: Disc desiccation throughout the lumbar and included lower thoracic spine with exception of L5-S1. Multilevel disc space narrowing, severe at L2-3. T11-12: Minimal disc bulging, endplate spurring, and asymmetrically severe left facet hypertrophy without stenosis. T12-L1: Disc bulging, endplate spurring, and mild facet hypertrophy without significant stenosis. L1-2: Disc bulging, endplate  spurring, and mild facet and ligamentum flavum hypertrophy without significant stenosis. L2-3: Disc bulging, endplate spurring, and mild facet and ligamentum flavum hypertrophy without significant  stenosis. L3-4: Disc bulging, endplate spurring, and moderate facet and ligamentum flavum hypertrophy result in moderate spinal stenosis, mild bilateral lateral recess stenosis, and mild right neural foraminal stenosis. L4-5: Minimal disc bulging and mild-to-moderate facet and ligamentum flavum hypertrophy result in mild left greater than right lateral recess stenosis and borderline spinal stenosis without neural foraminal stenosis. L5-S1: Minimal disc bulging and moderate facet and ligamentum flavum hypertrophy result in borderline to mild bilateral lateral recess stenosis without spinal or neural foraminal stenosis. IMPRESSION: 1. Acute/subacute T12 compression fracture with 75% height loss. Possible underlying 1.2 cm marrow lesion which could indicate a pathologic fracture from multiple myeloma or metastatic disease. 2. Lumbar levoscoliosis with diffuse disc and facet degeneration, most notable at L3-4 where there is moderate spinal stenosis. Electronically Signed   By: Logan Bores M.D.   On: 07/29/2021 19:41   MR PELVIS W WO CONTRAST  Result Date: 07/30/2021 CLINICAL DATA:  Low back pain radiating to the right groin and hip. EXAM: MRI PELVIS WITHOUT AND WITH CONTRAST TECHNIQUE: Multiplanar multisequence MR imaging of the pelvis was performed both before and after administration of intravenous contrast. CONTRAST:  684m MULTIHANCE GADOBENATE DIMEGLUMINE 529 MG/ML IV SOLN COMPARISON:  Radiographs 07/02/2021 and CT pelvis 08/30/2006 FINDINGS: Urinary Tract:  Unremarkable Bowel: Scattered sigmoid colon diverticula without findings of active diverticulitis. Vascular/Lymphatic: No pathologic adenopathy. Today's exam was not performed as an MR angiogram. Reproductive:  Unremarkable Other:  No supplemental non-categorized  findings. Musculoskeletal: Severe osteoarthritis of the right hip with prominent femoral head and acetabular spurring, prominent craniocaudad loss of articular cartilage, subcortical marrow edema, and small confluent subcortical cystic lesions along the right femoral head. There is mild to moderate left hip osteoarthritis. Borderline right hip joint effusion. SI joints unremarkable. No other significant abnormal marrow edema or marrow enhancement. Musculotendinous structures unremarkable. The pubis appears unremarkable. There is evidence of levoconvex lumbar scoliosis with spondylosis and degenerative disc disease better depicted on the dedicated lumbar spine MRI. Sacrum and coccyx unremarkable. IMPRESSION: 1. Severe right and mild left degenerative hip arthropathy. Borderline right hip joint effusion. 2. Scattered sigmoid colon diverticula. Electronically Signed   By: WVan ClinesM.D.   On: 07/30/2021 08:35   CT BONE TROCAR/NEEDLE BIOPSY DEEP  Result Date: 08/11/2021 CLINICAL DATA:  Acute/subacute T12 compression fracture with 75% height loss. Possible underlying 1.2 cm marrow lesion which could indicate a pathologic fracture from multiple myeloma or metastatic disease. Biopsy requested. EXAM: CT GUIDED DEEP BONE CORE BIOPSY OF T12 VERTEBRAL BODY ANESTHESIA/SEDATION: Intravenous Fentanyl 1032m and Versed 1.84m584mere administered as conscious sedation during continuous monitoring of the patient's level of consciousness and physiological / cardiorespiratory status by the radiology RN, with a total moderate sedation time of 26 minutes. PROCEDURE: The procedure risks, benefits, and alternatives were explained to the patient. Questions regarding the procedure were encouraged and answered. The patient understands and consents to the procedure. patient placed prone. the T12 vertebral body was localized and an appropriate skin site was determined and marked. The overlying skin was prepped with Betadinein a  sterile fashion, and a sterile drape was applied covering the operative field. A sterile gown and sterile gloves were used for the procedure. Local anesthesia was provided with 1% Lidocaine. Kyphon bone needle was advanced to the posterior third of the T12 vertebral body from an oblique right transpedicular approach. Multiple coaxial core bone biopsy samples were obtained through the middle third of the T12 vertebral body, region of lesion. A final core biopsy was obtained using the  bone needle itself which was then removed. Specimen placed in formalin and submitted to surgical pathology. The patient tolerated the procedure well. COMPLICATIONS: None immediate FINDINGS: T12 vertebral body was again identified, with note made of subacute compression deformity as before. Core biopsy samples of the region of the lesion in the middle third of the vertebral body were obtained, submitted to surgical pathology. IMPRESSION: 1. Technically successful core deep bone biopsy of T12 vertebral body lesion under fluoroscopic guidance. Electronically Signed   By: Lucrezia Europe M.D.   On: 08/11/2021 15:05   IR Radiologist Eval & Mgmt  Result Date: 08/05/2021 Please refer to notes tab for details about interventional procedure. (Op Note)   ASSESSMENT & PLAN:   65 yo female with   1) T12 pathologic compression fracture with reported 1.2 cm lateral lesion on MRI of the lumbar spine. Patient has several risk factors for osteoporosis including thyroid disorder, age, alcohol abuse history, chronic inflammation from Crohn's disease. Patient has had a CT-guided deep bone core biopsy on 08/11/2021 results from which are pending.  2) hypercalcemia possibly from dehydration.  Hypercalcemia is currently resolved on labs today. 3) Crohn's disease 4) HTN 5) Osteopenia on DEXA scan 05/2021 -- with compression fracture would qualify as osteoporosis PLAN -I reviewed all available labs and imaging studies in details with the  patient. -We discussed that her pathologic T12 compression fracture could very well be from osteoporosis but since MRI did show a potential bone marrow lesion would work this up further to rule out multiple myeloma or other metastatic disease. -We discussed that it is timely that she has already had a CT-guided deep bone core biopsy to try to evaluate her T12 lesion.  This was done on 08/11/2021 surgical pathology result on this is currently pending. -Labs were ordered as noted below for additional work-up of her hypercalcemia and her T12 bone lesion. -Patient notes no other overt focal symptoms suggestive of malignancy. -We discussed that if there is any evidence of malignancy on her biopsy she will likely need CT chest abdomen pelvis to evaluate for other evidence of malignancy.  She wants to hold off on additional imaging unless the T12 biopsy on blood tests are more concerning. -We discussed that if there is no evidence of malignancy or myeloma pathologic T12 compression fracture would qualify being from osteoporosis and she will need to follow-up with her primary care physician to optimize management of osteoporosis. Follow-up Labs today Phone visit with Dr Irene Limbo in 1 week . Orders Placed This Encounter  Procedures   CBC with Differential/Platelet    Standing Status:   Future    Number of Occurrences:   1    Standing Expiration Date:   08/12/2022   CMP ([place] only)    Standing Status:   Future    Number of Occurrences:   1    Standing Expiration Date:   08/12/2022   Lactate dehydrogenase    Standing Status:   Future    Number of Occurrences:   1    Standing Expiration Date:   08/12/2022   Multiple Myeloma Panel (SPEP&IFE w/QIG)    Standing Status:   Future    Number of Occurrences:   1    Standing Expiration Date:   08/12/2022   Kappa/lambda light chains    Standing Status:   Future    Number of Occurrences:   1    Standing Expiration Date:   08/12/2022   Beta 2 microglobulin  Standing Status:   Future    Number of Occurrences:   1    Standing Expiration Date:   08/12/2022   Sedimentation rate    Standing Status:   Future    Number of Occurrences:   1    Standing Expiration Date:   08/12/2022     All of the patients questions were answered with apparent satisfaction. The patient knows to call the clinic with any problems, questions or concerns.  I spent 40 minutes counseling the patient face to face. The total time spent in the appointment was 60 minutes and more than 50% was on counseling and direct patient cares.    Tammy Lone MD [place] AAHIVMS [facility] Hematology/Oncology Physician [facility]  (Office):       [phone] (Work cell):  [phone] (Fax):           [phone]  08/12/2021 11:15 AM

## 2021-08-13 LAB — KAPPA/LAMBDA LIGHT CHAINS
Kappa free light chain: 15.8 mg/L (ref 3.3–19.4)
Kappa, lambda light chain ratio: 1.08 (ref 0.26–1.65)
Lambda free light chains: 14.6 mg/L (ref 5.7–26.3)

## 2021-08-13 LAB — BETA 2 MICROGLOBULIN, SERUM: Beta-2 Microglobulin: 1.7 mg/L (ref 0.6–2.4)

## 2021-08-14 LAB — MULTIPLE MYELOMA PANEL, SERUM
Albumin SerPl Elph-Mcnc: 3.5 g/dL (ref 2.9–4.4)
Albumin/Glob SerPl: 1.3 (ref 0.7–1.7)
Alpha 1: 0.2 g/dL (ref 0.0–0.4)
Alpha2 Glob SerPl Elph-Mcnc: 0.8 g/dL (ref 0.4–1.0)
B-Globulin SerPl Elph-Mcnc: 1 g/dL (ref 0.7–1.3)
Gamma Glob SerPl Elph-Mcnc: 0.8 g/dL (ref 0.4–1.8)
Globulin, Total: 2.9 g/dL (ref 2.2–3.9)
IgA: 121 mg/dL (ref 87–352)
IgG (Immunoglobin G), Serum: 843 mg/dL (ref 586–1602)
IgM (Immunoglobulin M), Srm: 67 mg/dL (ref 26–217)
Total Protein ELP: 6.4 g/dL (ref 6.0–8.5)

## 2021-08-14 LAB — SURGICAL PATHOLOGY

## 2021-08-19 ENCOUNTER — Inpatient Hospital Stay (HOSPITAL_BASED_OUTPATIENT_CLINIC_OR_DEPARTMENT_OTHER): Payer: Medicare Other | Admitting: Hematology

## 2021-08-19 DIAGNOSIS — M899 Disorder of bone, unspecified: Secondary | ICD-10-CM

## 2021-08-19 DIAGNOSIS — K5090 Crohn's disease, unspecified, without complications: Secondary | ICD-10-CM | POA: Diagnosis not present

## 2021-08-19 DIAGNOSIS — M545 Low back pain, unspecified: Secondary | ICD-10-CM | POA: Diagnosis not present

## 2021-08-19 DIAGNOSIS — M4854XA Collapsed vertebra, not elsewhere classified, thoracic region, initial encounter for fracture: Secondary | ICD-10-CM | POA: Diagnosis not present

## 2021-08-19 DIAGNOSIS — E039 Hypothyroidism, unspecified: Secondary | ICD-10-CM | POA: Diagnosis not present

## 2021-08-26 NOTE — Addendum Note (Signed)
Addended by: Sullivan Lone on: 08/26/2021 12:00 PM   Modules accepted: Level of Service

## 2021-08-27 NOTE — Progress Notes (Signed)
Marland Kitchen   HEMATOLOGY/ONCOLOGY PHONE VISIT NOTE  Date of Service: 08/27/2021  Patient Care Team: Binnie Rail, MD as PCP - General (Internal Medicine) Lafayette Dragon, MD (Inactive) as Referring Physician (Gastroenterology)  CHIEF COMPLAINTS/PURPOSE OF CONSULTATION:  Hypercalcemia and T12 compression fracture with possible underlying bone marrow lesion rule out multiple myeloma.  HISTORY OF PRESENTING ILLNESS:   Tammy Kennedy is a wonderful 65 y.o. female who has been referred to [place] by Dr .Quay Burow, Claudina Lick, MD  for evaluation and management of T12 pathologic compression fracture with 1.2 cm marrow lesion associated with hypercalcemia rule out multiple myeloma.  Patient has history of Crohn's disease, hypothyroidism, dyslipidemia, alcohol abuse, osteoporosis who presented with mid to lower back pain for many months.  She had a x-ray of the lumbar spine on 03/27/2021 which showed Scoliosis with rotatory component. Age uncertain anterior wedging at T12 and L1 with milder anterior wedging at L3 and L5. No increasing spondylolisthesis. Multilevel osteoarthritic change noted.  She had a DEXA scan on 06/05/2021 with L-spine T score of -2.0, right femoral neck -1.3 and left femoral neck -2.2. This was in the osteopenia range but with her compression fracture in the spine would likely qualify as osteoporosis.  Patient continues to have back pain and had an MRI of her lumbar spine without contrast on 07/28/2021 which showed Acute/subacute T12 compression fracture with 75% height loss.Possible underlying 1.2 cm marrow lesion which could indicate a pathologic fracture from multiple myeloma or metastatic disease.  Lumbar levoscoliosis with diffuse disc and facet degeneration, most notable at L3-4 where there is moderate spinal stenosis.  Patient was referred to interventional radiology and had a CT-guided deep bone core biopsy of T12 vertebral body on 08/11/2021 the pathology from which is pending at this  time.  Labs done by her primary care physician on 08/01/2021 showed normal CBC with a hemoglobin of 14.6 WBC count of 4.3k and platelets of 290k CMP in August showed some hypercalcemia up to 11.3 but on repeat labs on 08/01/2021 CMP is within normal limits with a calcium of 10 PTH 62 TSH is 0.31 LDH 137 Sedimentation rate within normal limits at 21 Serum protein electrophoresis did not report any monoclonal protein spike.  Patient was referred to [place] for further evaluation of this bone marrow lesion and our hypercalcemia (which has now resolved).  Patient notes no other focal bone pain at this time.  No fevers no chills no night sweats no unexpected weight loss. No other acute new focal lesions.   She reports that her Crohn's disease is well controlled with mesalamine and she notes no history of seronegative arthritis related to this.   INTERVAL HISTORY  .I connected with [name] on .11/08/2022at  3:20 PM EST by telephone visit and verified that I am speaking with the correct person using two identifiers.   I discussed the limitations, risks, security and privacy concerns of performing an evaluation and management service by telemedicine and the availability of in-person appointments. I also discussed with the patient that there may be a patient responsible charge related to this service. The patient expressed understanding and agreed to proceed.   Other persons participating in the visit and their role in the encounter: None   Patient's location: Home Provider's location: [place]  Patient was called to discuss her results from her recent biopsy as well as other lab testing for her T12 marrow lesion and hypercalcemia. Patient notes no new symptoms since her last  clinic visit.  No back pains.  No fevers no chills no night sweats no unexpected weight loss.  Surgical pathology result from her T12 deep bone core biopsy from 08/11/2021 shows no evidence to suggest  metastatic malignancy or myeloma.   Labs done on 08/12/2021 showed normal CBC, CMP unremarkable with no hypercalcemia, LDH within normal limits at 159 Myeloma panel with no monoclonal paraproteinemia Kappa lambda light chains within normal limits.  Patient is glad with the results.  We discussed that based on current work-up there is no specific diagnosis of malignancy.  We discussed possible option of doing a CT chest abdomen pelvis and bone scan or PET/CT scan to ensure there is no other evidence of malignancy elsewhere in the chance that the biopsy missed the lesion.  Patient prefers to hold off on this and continue follow-up with primary care physician which is not unreasonable.  MEDICAL HISTORY:  Past Medical History:  Diagnosis Date   Alcohol abuse 05/14/2016   ALCOHOL ABUSE, HX OF    ANEMIA    ANXIETY DISORDER    Basal cell carcinoma (BCC) 01/10/2018   C. difficile colitis    CROHN'S DISEASE    DEPRESSION    DRY EYE SYNDROME    HEPATOMEGALY, HX OF    HYPERLIPIDEMIA    intol of statin trials   HYPOTHYROIDISM    OSTEOPENIA    Osteoporosis 05/2017   states Pre osteoporosis 8/18   Rectovaginal fistula 2012   chron's disease   Tubular adenoma 2013    SURGICAL HISTORY: Past Surgical History:  Procedure Laterality Date   ANAL FISSURE REPAIR     CATARACT EXTRACTION     EYE SURGERY  11/2017   macular pucker   HEMORRHOID SURGERY     IR RADIOLOGIST EVAL & MGMT  08/05/2021   Left thumb surgery  1989   MOHS SURGERY  2008   Nose BCC   Theraputic abortion      SOCIAL HISTORY: Social History   Socioeconomic History   Marital status: Divorced    Spouse name: Not on file   Number of children: Not on file   Years of education: Not on file   Highest education level: Not on file  Occupational History   Not on file  Tobacco Use   Smoking status: Never   Smokeless tobacco: Never  Vaping Use   Vaping Use: Never used  Substance and Sexual Activity   Alcohol use: Yes     Comment: has drank excessively, not drinking daily   Drug use: No   Sexual activity: Not on file  Other Topics Concern   Not on file  Social History Narrative   Works at [employer]- part time   Social Determinants of Health   Financial Resource Strain: Not on file  Food Insecurity: Not on file  Transportation Needs: Not on file  Physical Activity: Not on file  Stress: Not on file  Social Connections: Not on file  Intimate Partner Violence: Not on file    FAMILY HISTORY: Family History  Adopted: Yes  Problem Relation Age of Onset   Breast cancer Neg Hx     ALLERGIES:  has No Known Allergies.  MEDICATIONS:  Current Outpatient Medications  Medication Sig Dispense Refill   b complex vitamins capsule Take 1 capsule by mouth daily.     calcium-vitamin D (OSCAL WITH D) 500-200 MG-UNIT tablet Take 1 tablet by mouth daily. (Patient not taking: Reported on 08/12/2021)     cholecalciferol (  VITAMIN D3) 25 MCG (1000 UNIT) tablet Take 1,000 Units by mouth daily. (Patient not taking: Reported on 08/12/2021)     fenofibrate 160 MG tablet Take 1 tablet (160 mg total) by mouth daily. (Patient not taking: Reported on 08/12/2021) 90 tablet 3   folic acid (FOLVITE) 1 MG tablet Take 2 mg by mouth daily.     levothyroxine (SYNTHROID) 75 MCG tablet Take 1 tablet (75 mcg total) by mouth daily. Take 6 days a week only. 90 tablet 1   mesalamine (APRISO) 0.375 g 24 hr capsule Take 3 capsules (1.125 g total) by mouth daily. Take 3 tabs daily for 1 Month, Then take 4 Tabs daily from then on (Patient taking differently: Take 1,125 mg by mouth daily. 4 Tabs daily) 90 capsule 7   Potassium Gluconate 550 MG TABS Take 2 tablets by mouth daily.      rosuvastatin (CRESTOR) 5 MG tablet Take 1 tablet (5 mg total) by mouth daily. 90 tablet 3   Sennosides (EX-LAX PO) Take by mouth as directed.     valACYclovir (VALTREX) 500 MG tablet TAKE 1 TABLET(500 MG) BY MOUTH DAILY 90 tablet 1    venlafaxine XR (EFFEXOR-XR) 150 MG 24 hr capsule TAKE 1 CAPSULE BY MOUTH EVERY MORNING WITH BREAKFAST 90 capsule 1   venlafaxine XR (EFFEXOR-XR) 75 MG 24 hr capsule TAKE 1 CAPSULE BY MOUTH DAILY WITH BREAKFAST IN ADDITION TO 150 MG DAILY 90 capsule 0   vitamin B-12 (CYANOCOBALAMIN) 500 MCG tablet Take 500 mcg by mouth daily.     No current facility-administered medications for this visit.    REVIEW OF SYSTEMS:    .10 Point review of Systems was done is negative except as noted above.   PHYSICAL EXAMINATION: Telemedicine visit  LABORATORY DATA:  I have reviewed the data as listed  . CBC Latest Ref Rng & Units 08/12/2021 08/01/2021 06/10/2021  WBC 4.0 - 10.5 K/uL 4.6 4.3 4.6  Hemoglobin 12.0 - 15.0 g/dL 13.3 14.6 13.5  Hematocrit 36.0 - 46.0 % 39.1 43.7 40.4  Platelets 150 - 400 K/uL 249 290.0 315.0    . CMP Latest Ref Rng & Units 08/12/2021 08/01/2021 08/01/2021  Glucose 70 - 99 mg/dL 85 - 98  BUN 8 - 23 mg/dL 19 - 18  Creatinine 0.44 - 1.00 mg/dL 0.75 - 0.77  Sodium 135 - 145 mmol/L 140 - 138  Potassium 3.5 - 5.1 mmol/L 4.2 - 4.4  Chloride 98 - 111 mmol/L 107 - 102  CO2 22 - 32 mmol/L 27 - 28  Calcium 8.9 - 10.3 mg/dL 9.3 9.9 10.0  Total Protein 6.5 - 8.1 g/dL 6.9 7.4 7.4  Total Bilirubin 0.3 - 1.2 mg/dL 0.4 - 0.5  Alkaline Phos 38 - 126 U/L 92 - 97  AST 15 - 41 U/L 25 - 30  ALT 0 - 44 U/L 25 - 30   SURGICAL PATHOLOGY  CASE: [id number]  PATIENT: Tammy Kennedy  Surgical Pathology Report      Clinical History: acute/subacute T12 compression fracture with 75%  height loss, possible underlying 1.2 cm marrow lesion which could  indicate a pathologic fracture from multiple myeloma or metastatic  disease (cm)    FINAL MICROSCOPIC DIAGNOSIS:   A. BONE, T12, BIOPSY:  -  Fragments of reactive bone  -  Marrow with serous atrophy-like changes and polytypic plasma cells  -  No extrinsic cell population identified  -  See comment   COMMENT:   The biopsy consists of  multiple fragments   of reactive bone with crush  artifact and serous atrophy-like changes.  Cytokeratin 7, cytokeratin 20  and cytokeratin AE1/3 are negative, supporting the absence of an  extrinsic epithelial cell population.  CD138 highlights scattered plasma  cells, which are polytypic by kappa and lambda in situ hybridization.  Overall, there are no features to suggest malignancy in the examined  material.   RADIOGRAPHIC STUDIES: I have personally reviewed the radiological images as listed and agreed with the findings in the report. MR LUMBAR SPINE WO CONTRAST  Result Date: 07/29/2021 CLINICAL DATA:  Low back pain radiating to the right groin and hip and down the leg for 1 year with weakness and numbness. EXAM: MRI LUMBAR SPINE WITHOUT CONTRAST TECHNIQUE: Multiplanar, multisequence MR imaging of the lumbar spine was performed. No intravenous contrast was administered. COMPARISON:  Lumbar spine radiograph 03/27/2021 FINDINGS: Segmentation:  Standard. Alignment: Moderate to severe levoscoliosis with apex at L2-3. No significant listhesis in the sagittal plane. Vertebrae: T12 compression fracture with 75% vertebral body height loss and moderate marrow edema extending into the posterior elements. L1 compression fracture with 55% height loss and at most minimal endplate edema, largely chronic in appearance. Mild chronic L5 vertebral body height loss. Mildly heterogeneous bone marrow signal diffusely as well as a more focal and rounded 1.2 cm region of T1 hypointensity in the T12 vertebral body. Conus medullaris and cauda equina: Conus extends to the L1 level. Conus and cauda equina appear normal. Paraspinal and other soft tissues: 1.9 cm cyst in the right kidney. Disc levels: Disc desiccation throughout the lumbar and included lower thoracic spine with exception of L5-S1. Multilevel disc space narrowing, severe at L2-3. T11-12: Minimal disc bulging, endplate spurring, and asymmetrically severe left facet  hypertrophy without stenosis. T12-L1: Disc bulging, endplate spurring, and mild facet hypertrophy without significant stenosis. L1-2: Disc bulging, endplate spurring, and mild facet and ligamentum flavum hypertrophy without significant stenosis. L2-3: Disc bulging, endplate spurring, and mild facet and ligamentum flavum hypertrophy without significant stenosis. L3-4: Disc bulging, endplate spurring, and moderate facet and ligamentum flavum hypertrophy result in moderate spinal stenosis, mild bilateral lateral recess stenosis, and mild right neural foraminal stenosis. L4-5: Minimal disc bulging and mild-to-moderate facet and ligamentum flavum hypertrophy result in mild left greater than right lateral recess stenosis and borderline spinal stenosis without neural foraminal stenosis. L5-S1: Minimal disc bulging and moderate facet and ligamentum flavum hypertrophy result in borderline to mild bilateral lateral recess stenosis without spinal or neural foraminal stenosis. IMPRESSION: 1. Acute/subacute T12 compression fracture with 75% height loss. Possible underlying 1.2 cm marrow lesion which could indicate a pathologic fracture from multiple myeloma or metastatic disease. 2. Lumbar levoscoliosis with diffuse disc and facet degeneration, most notable at L3-4 where there is moderate spinal stenosis. Electronically Signed   By: Allen  Grady M.D.   On: 07/29/2021 19:41   MR PELVIS W WO CONTRAST  Result Date: 07/30/2021 CLINICAL DATA:  Low back pain radiating to the right groin and hip. EXAM: MRI PELVIS WITHOUT AND WITH CONTRAST TECHNIQUE: Multiplanar multisequence MR imaging of the pelvis was performed both before and after administration of intravenous contrast. CONTRAST:  10mL MULTIHANCE GADOBENATE DIMEGLUMINE 529 MG/ML IV SOLN COMPARISON:  Radiographs 07/02/2021 and CT pelvis 08/30/2006 FINDINGS: Urinary Tract:  Unremarkable Bowel: Scattered sigmoid colon diverticula without findings of active diverticulitis.  Vascular/Lymphatic: No pathologic adenopathy. Today's exam was not performed as an MR angiogram. Reproductive:  Unremarkable Other:  No supplemental non-categorized findings. Musculoskeletal: Severe osteoarthritis of the right hip with prominent   femoral head and acetabular spurring, prominent craniocaudad loss of articular cartilage, subcortical marrow edema, and small confluent subcortical cystic lesions along the right femoral head. There is mild to moderate left hip osteoarthritis. Borderline right hip joint effusion. SI joints unremarkable. No other significant abnormal marrow edema or marrow enhancement. Musculotendinous structures unremarkable. The pubis appears unremarkable. There is evidence of levoconvex lumbar scoliosis with spondylosis and degenerative disc disease better depicted on the dedicated lumbar spine MRI. Sacrum and coccyx unremarkable. IMPRESSION: 1. Severe right and mild left degenerative hip arthropathy. Borderline right hip joint effusion. 2. Scattered sigmoid colon diverticula. Electronically Signed   By: Walter  Liebkemann M.D.   On: 07/30/2021 08:35   CT BONE TROCAR/NEEDLE BIOPSY DEEP  Result Date: 08/11/2021 CLINICAL DATA:  Acute/subacute T12 compression fracture with 75% height loss. Possible underlying 1.2 cm marrow lesion which could indicate a pathologic fracture from multiple myeloma or metastatic disease. Biopsy requested. EXAM: CT GUIDED DEEP BONE CORE BIOPSY OF T12 VERTEBRAL BODY ANESTHESIA/SEDATION: Intravenous Fentanyl 100mcg and Versed 1.5mg were administered as conscious sedation during continuous monitoring of the patient's level of consciousness and physiological / cardiorespiratory status by the radiology RN, with a total moderate sedation time of 26 minutes. PROCEDURE: The procedure risks, benefits, and alternatives were explained to the patient. Questions regarding the procedure were encouraged and answered. The patient understands and consents to the procedure.  patient placed prone. the T12 vertebral body was localized and an appropriate skin site was determined and marked. The overlying skin was prepped with Betadinein a sterile fashion, and a sterile drape was applied covering the operative field. A sterile gown and sterile gloves were used for the procedure. Local anesthesia was provided with 1% Lidocaine. Kyphon bone needle was advanced to the posterior third of the T12 vertebral body from an oblique right transpedicular approach. Multiple coaxial core bone biopsy samples were obtained through the middle third of the T12 vertebral body, region of lesion. A final core biopsy was obtained using the bone needle itself which was then removed. Specimen placed in formalin and submitted to surgical pathology. The patient tolerated the procedure well. COMPLICATIONS: None immediate FINDINGS: T12 vertebral body was again identified, with note made of subacute compression deformity as before. Core biopsy samples of the region of the lesion in the middle third of the vertebral body were obtained, submitted to surgical pathology. IMPRESSION: 1. Technically successful core deep bone biopsy of T12 vertebral body lesion under fluoroscopic guidance. Electronically Signed   By: D  Hassell M.D.   On: 08/11/2021 15:05   IR Radiologist Eval & Mgmt  Result Date: 08/05/2021 Please refer to notes tab for details about interventional procedure. (Op Note)   ASSESSMENT & PLAN:   65 yo female with   1) T12 pathologic compression fracture with reported 1.2 cm lateral lesion on MRI of the lumbar spine. Patient has several risk factors for osteoporosis including thyroid disorder, age, alcohol abuse history, chronic inflammation from Crohn's disease. Patient has had a CT-guided deep bone core biopsy on 08/11/2021 results from which are pending.  2) hypercalcemia possibly from dehydration.  Hypercalcemia is currently resolved on labs today. 3) Crohn's disease 4) HTN 5) Osteopenia on  DEXA scan 05/2021 -- with compression fracture would qualify as osteoporosis PLAN -Patient notes no new symptoms since her last clinic visit.  No back pains.  No fevers no chills no night sweats no unexpected weight loss.  Surgical pathology result from her T12 deep bone core biopsy from 08/11/2021 shows no evidence   to suggest metastatic malignancy or myeloma.   Labs done on 08/12/2021 showed normal CBC, CMP unremarkable with no hypercalcemia, LDH within normal limits at 159 Myeloma panel with no monoclonal paraproteinemia Kappa lambda light chains within normal limits.  Patient is glad with the results.  We discussed that based on current work-up there is no specific diagnosis of malignancy.  We discussed possible option of doing a CT chest abdomen pelvis and bone scan or PET/CT scan to ensure there is no other evidence of malignancy elsewhere in the chance that the biopsy missed the lesion.  Patient prefers to hold off on this and continue follow-up with primary care physician which is not unreasonable.  -We discussed that her pathologic T12 compression fracture could very well be from osteoporosis  -We discussed that since there is no evidence of malignancy or myeloma pathologic T12 compression fracture would qualify being from osteoporosis and she will need to follow-up with her primary care physician to optimize aggressive management of osteoporosis to avoid additional vertebral and other fractures.  Follow-up Continue follow-up with primary care physician Dr. Billey Gosling  No orders of the defined types were placed in this encounter.    All of the patients questions were answered with apparent satisfaction. The patient knows to call the clinic with any problems, questions or concerns.  . The total time spent in the appointment was 20 minutes and more than 50% was on counseling and direct patient cares.     Sullivan Lone MD [place] AAHIVMS [facility] Hematology/Oncology Physician [facility]

## 2021-09-02 ENCOUNTER — Other Ambulatory Visit: Payer: Self-pay | Admitting: Internal Medicine

## 2021-09-15 ENCOUNTER — Encounter: Payer: Self-pay | Admitting: Gastroenterology

## 2021-09-15 ENCOUNTER — Ambulatory Visit: Payer: Medicare Other | Admitting: Gastroenterology

## 2021-09-15 VITALS — BP 104/70 | HR 88 | Ht 59.0 in | Wt 117.4 lb

## 2021-09-15 DIAGNOSIS — E559 Vitamin D deficiency, unspecified: Secondary | ICD-10-CM

## 2021-09-15 DIAGNOSIS — K5080 Crohn's disease of both small and large intestine without complications: Secondary | ICD-10-CM

## 2021-09-15 NOTE — Patient Instructions (Addendum)
Use Vitamin D 400 IU daily   Balsalazide Is preferred Level 1 through your insurance, call us with your mail order information   When you have your next labs drawn you will need a ESR CRP drawn  Follow up in 4 months   If you are age 65 or older, your body mass index should be between 23-30. Your Body mass index is 23.71 kg/m. If this is out of the aforementioned range listed, please consider follow up with your Primary Care Provider.  If you are age 35 or younger, your body mass index should be between 19-25. Your Body mass index is 23.71 kg/m. If this is out of the aformentioned range listed, please consider follow up with your Primary Care Provider.   ________________________________________________________  The [facility] providers would like to encourage you to use [facility] to communicate with providers for non-urgent requests or questions.  Due to long hold times on the telephone, sending your provider a message by [facility] may be a faster and more efficient way to get a response.  Please allow 48 business hours for a response.  Please remember that this is for non-urgent requests.  _______________________________________________________   Due to recent changes in healthcare laws, you may see the results of your imaging and laboratory studies on MyChart before your provider has had a chance to review them.  We understand that in some cases there may be results that are confusing or concerning to you. Not all laboratory results come back in the same time frame and the provider may be waiting for multiple results in order to interpret others.  Please give [place] 48 hours in order for your provider to thoroughly review all the results before contacting the office for clarification of your results.    Thank you for choosing [facility]  Karleen Hampshire [place],MD

## 2021-09-15 NOTE — Progress Notes (Signed)
Tammy Kennedy    [id number]    04-15-1956  Primary Care Physician:Burns, Claudina Lick, MD  Referring Physician: Binnie Rail, MD [place],  [place] [address]   Chief complaint:  Crohn's disease  HPI:  65 year old very pleasant female with history of Crohn's disease involving ileum and colon, history of fistulizing disease with rectovaginal fistula here for follow-up visit   She is overall doing well on daily mesalamine.  She was tapered off Imuran and was on budesonide during transition   Denies any nausea, vomiting, abdominal pain, melena or bright red blood per rectum Overall doing well from GI standpoint   Lower back compression fracture with osteoarthritic changes L3-L5, high vitamin D level.   Vitamin D level was elevated at 73 in June, has since dropped down to below 30.  She has started not started taking vitamin D supplements.  Colonoscopy April 23, 2020: Mild inactive colitis, quiescent.  Right and left colon biopsies showed benign mucosa with no active inflammation.  Diverticulosis   Outpatient Encounter Medications as of 09/15/2021  Medication Sig   b complex vitamins capsule Take 1 capsule by mouth daily.   calcium-vitamin D (OSCAL WITH D) 500-200 MG-UNIT tablet Take 1 tablet by mouth daily.   cholecalciferol (VITAMIN D3) 25 MCG (1000 UNIT) tablet Take 1,000 Units by mouth daily.   folic acid (FOLVITE) 1 MG tablet Take 2 mg by mouth daily.   levothyroxine (SYNTHROID) 75 MCG tablet Take 1 tablet (75 mcg total) by mouth daily. Take 6 days a week only.   mesalamine (APRISO) 0.375 g 24 hr capsule Take 3 capsules (1.125 g total) by mouth daily. Take 3 tabs daily for 1 Month, Then take 4 Tabs daily from then on (Patient taking differently: Take 1,125 mg by mouth daily. 4 Tabs daily)   Potassium Gluconate 550 MG TABS Take 2 tablets by mouth daily.    rosuvastatin (CRESTOR) 5 MG tablet Take 1 tablet (5 mg total) by mouth daily.   Sennosides (EX-LAX  PO) Take by mouth as directed.   valACYclovir (VALTREX) 500 MG tablet TAKE 1 TABLET(500 MG) BY MOUTH DAILY   venlafaxine XR (EFFEXOR-XR) 150 MG 24 hr capsule TAKE 1 CAPSULE BY MOUTH EVERY MORNING WITH BREAKFAST   venlafaxine XR (EFFEXOR-XR) 75 MG 24 hr capsule TAKE 1 CAPSULE BY MOUTH DAILY WITH BREAKFAST IN ADDITION TO 150 MG DAILY   vitamin B-12 (CYANOCOBALAMIN) 500 MCG tablet Take 500 mcg by mouth daily.   [DISCONTINUED] fenofibrate 160 MG tablet Take 1 tablet (160 mg total) by mouth daily. (Patient not taking: Reported on 08/12/2021)   No facility-administered encounter medications on file as of 09/15/2021.    Allergies as of 09/15/2021   (No Known Allergies)    Past Medical History:  Diagnosis Date   Alcohol abuse 05/14/2016   ALCOHOL ABUSE, HX OF    ANEMIA    ANXIETY DISORDER    Basal cell carcinoma (BCC) 01/10/2018   C. difficile colitis    CROHN'S DISEASE    DEPRESSION    DRY EYE SYNDROME    HEPATOMEGALY, HX OF    HYPERLIPIDEMIA    intol of statin trials   HYPOTHYROIDISM    OSTEOPENIA    Osteoporosis 05/2017   states Pre osteoporosis 8/18   Rectovaginal fistula 2012   chron's disease   Tubular adenoma 2013    Past Surgical History:  Procedure Laterality Date   ANAL FISSURE REPAIR  CATARACT EXTRACTION     EYE SURGERY  11/2017   macular pucker   HEMORRHOID SURGERY     IR RADIOLOGIST EVAL & MGMT  08/05/2021   Left thumb surgery  1989   MOHS SURGERY  2008   Nose BCC   Theraputic abortion      Family History  Adopted: Yes  Problem Relation Age of Onset   Breast cancer Neg Hx     Social History   Socioeconomic History   Marital status: Divorced    Spouse name: Not on file   Number of children: Not on file   Years of education: Not on file   Highest education level: Not on file  Occupational History   Not on file  Tobacco Use   Smoking status: Never   Smokeless tobacco: Never  Vaping Use   Vaping Use: Never used  Substance and Sexual Activity    Alcohol use: Yes    Comment: has drank excessively, not drinking daily   Drug use: No   Sexual activity: Not on file  Other Topics Concern   Not on file  Social History Narrative   Works at Ha[employer]- [employer]- part time   Social Determinants of Radio broadcast assistant Strain: Not on file  Food Insecurity: Not on file  Transportation Needs: Not on file  Physical Activity: Not on file  Stress: Not on file  Social Connections: Not on file  Intimate Partner Violence: Not on file      Review of systems: All other review of systems negative except as mentioned in the HPI.   Physical Exam: Vitals:   09/15/21 1512  BP: 104/70  Pulse: 88   Body mass index is 23.71 kg/m. Gen:      No acute distress HEENT:  sclera anicteric Abd:      soft, non-tender; no palpable masses, no distension Ext:    No edema Neuro: alert and oriented x 3 Psych: normal mood and affect  Data Reviewed:  Reviewed labs, radiology imaging, old records and pertinent past GI work up   Assessment and Plan/Recommendations:  65 year old very pleasant female with history of Crohn's disease rectovaginal fistula currently in remission on mesalamine  Crohn's disease: Inactive colitis based on recent colonoscopy, remains asymptomatic Follow-up ESR/CRP to check inflammatory markers.  She is due for her annual labs through Dr. Quay Burow.    Return in 4 months or sooner if needed  This visit required 30 minutes of patient care (this includes precharting, chart review, review of results, face-to-face time used for counseling as well as treatment plan and follow-up. The patient was provided an opportunity to ask questions and all were answered. The patient agreed with the plan and demonstrated an understanding of the instructions.  Damaris Hippo , MD    CC: Binnie Rail, MD

## 2021-09-18 ENCOUNTER — Telehealth: Payer: Self-pay

## 2021-09-22 NOTE — Progress Notes (Signed)
Subjective:    Patient ID: Tammy Kennedy, female    DOB: Nov 13, 1955, 65 y.o.   MRN: [id number]   This visit occurred during the SARS-CoV-2 public health emergency.  Safety protocols were in place, including screening questions prior to the visit, additional usage of staff PPE, and extensive cleaning of exam room while observing appropriate contact time as indicated for disinfecting solutions.    HPI She is here for pre-operative clearance at the request of Dr Mayer Camel for right anterior hip arthroplasty under spinal anesthesia scheduled for 10/10/21. She is also here for follow up of her chronic medical conditions.   She denies any personal of problems with anesthesia or bleeding/blood clot problems.    She has no concerns.  She is taking all her medication as prescribed.   She is not exercising regularly.  With her daily activities she denies chest pain, palpitations, SOB and lightheadedness.       Medications and allergies reviewed with patient and updated if appropriate.  Patient Active Problem List   Diagnosis Date Noted   Hypercalcemia 06/10/2021   Degenerative disc disease, lumbar 03/27/2021   Right knee pain 07/29/2020   Hyperglycemia 04/01/2020   Lateral epicondylitis of right elbow 11/11/2018   Basal cell carcinoma (BCC) 01/10/2018   Gastroesophageal reflux disease 11/12/2017   Idiopathic scoliosis 05/14/2016   Alcohol abuse 05/14/2016   Rectovaginal fistula    C. difficile colitis    Crohn's disease (HCC)-Dr. Nandigam 05/27/2009   HEPATOMEGALY, HX OF 08/31/2008   Hypothyroidism 07/30/2008   Hypertriglyceridemia 07/30/2008   Anxiety 07/30/2008   Depression 07/30/2008   RENAL CYST 07/30/2008   DEGENERATIVE [place] DISEASE 07/30/2008   Osteopenia 07/30/2008    Current Outpatient Medications on File Prior to Visit  Medication Sig Dispense Refill   b complex vitamins capsule Take 1 capsule by mouth daily.     cholecalciferol (VITAMIN D3) 25 MCG (1000 UNIT)  tablet Take 1,000 Units by mouth daily.     folic acid (FOLVITE) 1 MG tablet Take 2 mg by mouth daily.     levothyroxine (SYNTHROID) 75 MCG tablet Take 1 tablet (75 mcg total) by mouth daily. Take 6 days a week only. 90 tablet 1   mesalamine (APRISO) 0.375 g 24 hr capsule Take 3 capsules (1.125 g total) by mouth daily. Take 3 tabs daily for 1 Month, Then take 4 Tabs daily from then on (Patient taking differently: Take 1,125 mg by mouth daily. 4 Tabs daily) 90 capsule 7   Potassium Gluconate 550 MG TABS Take 2 tablets by mouth daily.      rosuvastatin (CRESTOR) 5 MG tablet Take 1 tablet (5 mg total) by mouth daily. 90 tablet 3   Sennosides (EX-LAX PO) Take by mouth as directed.     valACYclovir (VALTREX) 500 MG tablet TAKE 1 TABLET(500 MG) BY MOUTH DAILY 90 tablet 1   venlafaxine XR (EFFEXOR-XR) 150 MG 24 hr capsule TAKE 1 CAPSULE BY MOUTH EVERY MORNING WITH BREAKFAST 90 capsule 1   venlafaxine XR (EFFEXOR-XR) 75 MG 24 hr capsule TAKE 1 CAPSULE BY MOUTH DAILY WITH BREAKFAST IN ADDITION TO 150 MG DAILY 90 capsule 0   vitamin B-12 (CYANOCOBALAMIN) 500 MCG tablet Take 500 mcg by mouth daily.     calcium-vitamin D (OSCAL WITH D) 500-200 MG-UNIT tablet Take 1 tablet by mouth daily. (Patient not taking: Reported on 09/23/2021)     No current facility-administered medications on file prior to visit.    Past Medical History:  Diagnosis  Date   Alcohol abuse 05/14/2016   ALCOHOL ABUSE, HX OF    ANEMIA    ANXIETY DISORDER    Basal cell carcinoma (BCC) 01/10/2018   C. difficile colitis    CROHN'S DISEASE    DEPRESSION    DRY EYE SYNDROME    HEPATOMEGALY, HX OF    HYPERLIPIDEMIA    intol of statin trials   HYPOTHYROIDISM    OSTEOPENIA    Osteoporosis 05/2017   states Pre osteoporosis 8/18   Rectovaginal fistula 2012   chron's disease   Tubular adenoma 2013    Past Surgical History:  Procedure Laterality Date   ANAL FISSURE REPAIR     CATARACT EXTRACTION     EYE SURGERY  11/2017   macular  pucker   HEMORRHOID SURGERY     IR RADIOLOGIST EVAL & MGMT  08/05/2021   Left thumb surgery  1989   MOHS SURGERY  2008   Nose BCC   Theraputic abortion      Social History   Socioeconomic History   Marital status: Divorced    Spouse name: Not on file   Number of children: Not on file   Years of education: Not on file   Highest education level: Not on file  Occupational History   Not on file  Tobacco Use   Smoking status: Never   Smokeless tobacco: Never  Vaping Use   Vaping Use: Never used  Substance and Sexual Activity   Alcohol use: Yes    Comment: has drank excessively, not drinking daily   Drug use: No   Sexual activity: Not on file  Other Topics Concern   Not on file  Social History Narrative   Works at Ha[employer]- [employer]- part time   Social Determinants of Radio broadcast assistant Strain: Not on file  Food Insecurity: Not on file  Transportation Needs: Not on file  Physical Activity: Not on file  Stress: Not on file  Social Connections: Not on file    Family History  Adopted: Yes  Problem Relation Age of Onset   Breast cancer Neg Hx     Review of Systems  Constitutional:  Positive for fatigue. Negative for appetite change, chills and fever.  Eyes:  Negative for visual disturbance.  Respiratory:  Negative for cough, shortness of breath and wheezing.   Cardiovascular:  Negative for chest pain, palpitations and leg swelling.  Gastrointestinal:  Negative for abdominal pain, blood in stool, constipation, diarrhea and nausea.       Occ gerd  Genitourinary:  Negative for dysuria and hematuria.  Musculoskeletal:  Positive for arthralgias.  Skin:  Negative for rash.  Neurological:  Negative for dizziness, light-headedness and headaches.  Psychiatric/Behavioral:  Positive for dysphoric mood. Negative for sleep disturbance. The patient is nervous/anxious.       Objective:   Vitals:   09/23/21 1504  BP: 106/78  Pulse: (!) 102   Temp: 98.5 F (36.9 C)  SpO2: 97%   Filed Weights   09/23/21 1504  Weight: 118 lb (53.5 kg)   Body mass index is 23.83 kg/m.  BP Readings from Last 3 Encounters:  09/23/21 106/78  09/15/21 104/70  08/12/21 129/90    Wt Readings from Last 3 Encounters:  09/23/21 118 lb (53.5 kg)  09/15/21 117 lb 6.4 oz (53.3 kg)  08/12/21 120 lb 1.6 oz (54.5 kg)         Physical Exam Constitutional: She appears well-developed and well-nourished. No distress.  HENT:  Head: Normocephalic and atraumatic.  Right Ear: External ear normal. Normal ear canal and TM Left Ear: External ear normal.  Normal ear canal and TM Mouth/Throat: Oropharynx is clear and moist.  Eyes: Conjunctivae and EOM are normal.  Neck: Neck supple. No tracheal deviation present. No thyromegaly present.  No carotid bruit  Cardiovascular: Normal rate, regular rhythm and normal heart sounds.   No murmur heard.  No edema. Pulmonary/Chest: Effort normal and breath sounds normal. No respiratory distress. She has no wheezes. She has no rales.  Abdominal: Soft. She exhibits no distension. There is no tenderness.  Lymphadenopathy: She has no cervical adenopathy.  Skin: Skin is warm and dry. She is not diaphoretic.  Psychiatric: She has a normal mood and affect. Her behavior is normal.     Lab Results  Component Value Date   WBC 8.2 09/23/2021   HGB 13.5 09/23/2021   HCT 40.7 09/23/2021   PLT 232.0 09/23/2021   GLUCOSE 99 09/23/2021   CHOL 216 (H) 07/22/2021   TRIG 230.0 (H) 07/22/2021   HDL 71.20 07/22/2021   LDLDIRECT 129.0 07/22/2021   LDLCALC 108 (H) 06/05/2020   ALT 19 09/23/2021   AST 20 09/23/2021   NA 140 09/23/2021   K 3.7 09/23/2021   CL 105 09/23/2021   CREATININE 0.98 09/23/2021   BUN 17 09/23/2021   CO2 30 09/23/2021   TSH 0.31 (L) 08/01/2021   HGBA1C 5.7 09/23/2021         Assessment & Plan:       See Problem List for Assessment and Plan of chronic medical problems.

## 2021-09-22 NOTE — Patient Instructions (Addendum)
    An EKG was done.    Blood work was ordered.      Medications changes include :   none    Please followup in 6 months

## 2021-09-23 ENCOUNTER — Encounter: Payer: Self-pay | Admitting: Internal Medicine

## 2021-09-23 ENCOUNTER — Ambulatory Visit (INDEPENDENT_AMBULATORY_CARE_PROVIDER_SITE_OTHER): Payer: Medicare Other | Admitting: Internal Medicine

## 2021-09-23 ENCOUNTER — Other Ambulatory Visit: Payer: Self-pay

## 2021-09-23 VITALS — BP 106/78 | HR 102 | Temp 98.5°F | Ht 59.0 in | Wt 118.0 lb

## 2021-09-23 DIAGNOSIS — E038 Other specified hypothyroidism: Secondary | ICD-10-CM

## 2021-09-23 DIAGNOSIS — K50919 Crohn's disease, unspecified, with unspecified complications: Secondary | ICD-10-CM | POA: Diagnosis not present

## 2021-09-23 DIAGNOSIS — F1010 Alcohol abuse, uncomplicated: Secondary | ICD-10-CM

## 2021-09-23 DIAGNOSIS — R739 Hyperglycemia, unspecified: Secondary | ICD-10-CM

## 2021-09-23 DIAGNOSIS — Z01818 Encounter for other preprocedural examination: Secondary | ICD-10-CM | POA: Diagnosis not present

## 2021-09-23 DIAGNOSIS — M810 Age-related osteoporosis without current pathological fracture: Secondary | ICD-10-CM

## 2021-09-23 DIAGNOSIS — E559 Vitamin D deficiency, unspecified: Secondary | ICD-10-CM

## 2021-09-23 DIAGNOSIS — K5080 Crohn's disease of both small and large intestine without complications: Secondary | ICD-10-CM | POA: Diagnosis not present

## 2021-09-23 DIAGNOSIS — F3289 Other specified depressive episodes: Secondary | ICD-10-CM

## 2021-09-23 DIAGNOSIS — F419 Anxiety disorder, unspecified: Secondary | ICD-10-CM

## 2021-09-23 LAB — COMPREHENSIVE METABOLIC PANEL
ALT: 19 U/L (ref 0–35)
AST: 20 U/L (ref 0–37)
Albumin: 3.9 g/dL (ref 3.5–5.2)
Alkaline Phosphatase: 76 U/L (ref 39–117)
BUN: 17 mg/dL (ref 6–23)
CO2: 30 mEq/L (ref 19–32)
Calcium: 9.8 mg/dL (ref 8.4–10.5)
Chloride: 105 mEq/L (ref 96–112)
Creatinine, Ser: 0.98 mg/dL (ref 0.40–1.20)
GFR: 60.65 mL/min (ref >60.00)
Glucose, Bld: 99 mg/dL (ref 70–99)
Potassium: 3.7 mEq/L (ref 3.5–5.1)
Sodium: 140 mEq/L (ref 135–145)
Total Bilirubin: 0.4 mg/dL (ref 0.2–1.2)
Total Protein: 6.8 g/dL (ref 6.0–8.3)

## 2021-09-23 LAB — SEDIMENTATION RATE: Sed Rate: 16 mm/hr (ref 0–30)

## 2021-09-23 LAB — CBC WITH DIFFERENTIAL/PLATELET
Basophils Absolute: 0.1 10*3/uL (ref 0.0–0.1)
Basophils Relative: 0.6 % (ref 0.0–3.0)
Eosinophils Absolute: 0.2 10*3/uL (ref 0.0–0.7)
Eosinophils Relative: 2.5 % (ref 0.0–5.0)
HCT: 40.7 % (ref 36.0–46.0)
Hemoglobin: 13.5 g/dL (ref 12.0–15.0)
Lymphocytes Relative: 14.4 % (ref 12.0–46.0)
Lymphs Abs: 1.2 10*3/uL (ref 0.7–4.0)
MCHC: 33.1 g/dL (ref 30.0–36.0)
MCV: 93.7 fl (ref 78.0–100.0)
Monocytes Absolute: 0.8 10*3/uL (ref 0.1–1.0)
Monocytes Relative: 9.8 % (ref 3.0–12.0)
Neutro Abs: 6 10*3/uL (ref 1.4–7.7)
Neutrophils Relative %: 72.7 % (ref 43.0–77.0)
Platelets: 232 10*3/uL (ref 150.0–400.0)
RBC: 4.34 Mil/uL (ref 3.87–5.11)
RDW: 13.1 % (ref 11.5–15.5)
WBC: 8.2 10*3/uL (ref 4.0–10.5)

## 2021-09-23 LAB — HEMOGLOBIN A1C: Hgb A1c MFr Bld: 5.7 % (ref 4.6–6.5)

## 2021-09-23 LAB — VITAMIN D 25 HYDROXY (VIT D DEFICIENCY, FRACTURES): VITD: 22.03 ng/mL — ABNORMAL LOW (ref 30.00–100.00)

## 2021-09-23 NOTE — Assessment & Plan Note (Addendum)
Chronic Controlled Management per Dr Silverio Decamp To have esr, vitamin d checked today

## 2021-09-23 NOTE — Assessment & Plan Note (Signed)
Chronic Taking vitamin D daily Check vitamin D level

## 2021-09-23 NOTE — Assessment & Plan Note (Signed)
Chronic Not ideally controlled Discussed options - she does not want to make any changes at this time She declined psych referral at this time Continue Effexor 225 mg daily

## 2021-09-23 NOTE — Assessment & Plan Note (Signed)
Chronic  Clinically euthyroid Currently taking levothyroxine 75 mcg daily

## 2021-09-23 NOTE — Assessment & Plan Note (Addendum)
Discuss calcium, vitamin d Advised starting medication for her bone - discussed prolia  And she is interested in that, which would be a good choice for her given side effects to boniva  She will review info on prolia and let  Me know if she wants to try it

## 2021-09-23 NOTE — Assessment & Plan Note (Signed)
States she is not drinking at this time

## 2021-09-23 NOTE — Assessment & Plan Note (Signed)
Chronic Check a1c Low sugar / carb diet Stressed regular exercise  

## 2021-09-23 NOTE — Assessment & Plan Note (Signed)
Here for preop for R hip replacement by Dr Mayer Camel She has no concerns and no symptoms suggestive of CAD or pulmonary disease EKG today  - NSR at 85 bmp, normal EKG, no change from prior compared to EKG from 2012 Ck labs cbc, cmp, a1c, pt/inr   She is considered low risk for surgery - as long as blood work is wnl

## 2021-09-24 DIAGNOSIS — M1611 Unilateral primary osteoarthritis, right hip: Secondary | ICD-10-CM | POA: Diagnosis not present

## 2021-09-26 LAB — PROTIME-INR
INR: 1 ratio (ref 0.8–1.0)
Prothrombin Time: 10.8 s (ref 9.6–13.1)

## 2021-09-29 ENCOUNTER — Encounter: Payer: Self-pay | Admitting: Gastroenterology

## 2021-10-01 DIAGNOSIS — M1611 Unilateral primary osteoarthritis, right hip: Secondary | ICD-10-CM | POA: Diagnosis not present

## 2021-10-10 DIAGNOSIS — M1611 Unilateral primary osteoarthritis, right hip: Secondary | ICD-10-CM | POA: Diagnosis not present

## 2021-10-10 DIAGNOSIS — Z96641 Presence of right artificial hip joint: Secondary | ICD-10-CM | POA: Diagnosis not present

## 2021-10-14 DIAGNOSIS — R531 Weakness: Secondary | ICD-10-CM | POA: Diagnosis not present

## 2021-10-14 DIAGNOSIS — M25651 Stiffness of right hip, not elsewhere classified: Secondary | ICD-10-CM | POA: Diagnosis not present

## 2021-10-14 DIAGNOSIS — Z96641 Presence of right artificial hip joint: Secondary | ICD-10-CM | POA: Diagnosis not present

## 2021-10-16 DIAGNOSIS — R531 Weakness: Secondary | ICD-10-CM | POA: Diagnosis not present

## 2021-10-16 DIAGNOSIS — M25651 Stiffness of right hip, not elsewhere classified: Secondary | ICD-10-CM | POA: Diagnosis not present

## 2021-10-16 DIAGNOSIS — Z96641 Presence of right artificial hip joint: Secondary | ICD-10-CM | POA: Diagnosis not present

## 2021-10-21 DIAGNOSIS — Z9889 Other specified postprocedural states: Secondary | ICD-10-CM | POA: Diagnosis not present

## 2021-10-21 DIAGNOSIS — M1611 Unilateral primary osteoarthritis, right hip: Secondary | ICD-10-CM | POA: Diagnosis not present

## 2021-10-21 DIAGNOSIS — R531 Weakness: Secondary | ICD-10-CM | POA: Diagnosis not present

## 2021-10-21 DIAGNOSIS — Z96641 Presence of right artificial hip joint: Secondary | ICD-10-CM | POA: Diagnosis not present

## 2021-10-21 DIAGNOSIS — M25651 Stiffness of right hip, not elsewhere classified: Secondary | ICD-10-CM | POA: Diagnosis not present

## 2021-10-24 DIAGNOSIS — R531 Weakness: Secondary | ICD-10-CM | POA: Diagnosis not present

## 2021-10-24 DIAGNOSIS — M25651 Stiffness of right hip, not elsewhere classified: Secondary | ICD-10-CM | POA: Diagnosis not present

## 2021-10-24 DIAGNOSIS — Z96641 Presence of right artificial hip joint: Secondary | ICD-10-CM | POA: Diagnosis not present

## 2021-10-26 ENCOUNTER — Encounter: Payer: Self-pay | Admitting: Gastroenterology

## 2021-10-27 ENCOUNTER — Telehealth: Payer: Self-pay

## 2021-10-27 ENCOUNTER — Other Ambulatory Visit: Payer: Self-pay

## 2021-10-27 MED ORDER — BALSALAZIDE DISODIUM 750 MG PO CAPS: ORAL_CAPSULE | ORAL | 3 refills | Status: DC

## 2021-10-27 NOTE — Telephone Encounter (Signed)
Please send Rx for Balsalazide 741m TID X 90 days with 3 refills. Thanks

## 2021-10-27 NOTE — Telephone Encounter (Signed)
Discussed with the patient. Called [facility] to cancel the mesalamine. Cautioned the patient to check her prescription purchase before she leaves the counter.

## 2021-10-27 NOTE — Telephone Encounter (Signed)
Rx to the [facility]. Message sent to the patient.

## 2021-10-27 NOTE — Telephone Encounter (Signed)
The patient has sent the following message.  At my last appointment, you said there are other medications that you could prescribe instead of the mesalamine.  I would like for you to do that.  [employer] suggested Sulfasalazin or Balsalazide.  Sulfasalazin costs the same as Mesalamine and I have taken it before.  Balsalazide is a third of the cost, so I would like to try that one.  The price they quote is for a 782m capsule.   So please send in this prescription and make sure to cancel the past prescription for mesalamine.  I don't want any confusion. Thank you!  Please advise.

## 2021-10-28 DIAGNOSIS — R531 Weakness: Secondary | ICD-10-CM | POA: Diagnosis not present

## 2021-10-28 DIAGNOSIS — M25651 Stiffness of right hip, not elsewhere classified: Secondary | ICD-10-CM | POA: Diagnosis not present

## 2021-10-28 DIAGNOSIS — Z96641 Presence of right artificial hip joint: Secondary | ICD-10-CM | POA: Diagnosis not present

## 2021-10-31 DIAGNOSIS — M25651 Stiffness of right hip, not elsewhere classified: Secondary | ICD-10-CM | POA: Diagnosis not present

## 2021-10-31 DIAGNOSIS — R531 Weakness: Secondary | ICD-10-CM | POA: Diagnosis not present

## 2021-10-31 DIAGNOSIS — Z96641 Presence of right artificial hip joint: Secondary | ICD-10-CM | POA: Diagnosis not present

## 2021-11-04 DIAGNOSIS — M25651 Stiffness of right hip, not elsewhere classified: Secondary | ICD-10-CM | POA: Diagnosis not present

## 2021-11-04 DIAGNOSIS — R531 Weakness: Secondary | ICD-10-CM | POA: Diagnosis not present

## 2021-11-04 DIAGNOSIS — Z96641 Presence of right artificial hip joint: Secondary | ICD-10-CM | POA: Diagnosis not present

## 2021-11-06 DIAGNOSIS — M25651 Stiffness of right hip, not elsewhere classified: Secondary | ICD-10-CM | POA: Diagnosis not present

## 2021-11-06 DIAGNOSIS — R531 Weakness: Secondary | ICD-10-CM | POA: Diagnosis not present

## 2021-11-06 DIAGNOSIS — Z96641 Presence of right artificial hip joint: Secondary | ICD-10-CM | POA: Diagnosis not present

## 2021-11-13 ENCOUNTER — Other Ambulatory Visit: Payer: Self-pay | Admitting: Internal Medicine

## 2021-12-09 NOTE — Telephone Encounter (Signed)
Appointment was made

## 2021-12-10 ENCOUNTER — Ambulatory Visit: Payer: BC Managed Care – PPO | Admitting: Internal Medicine

## 2021-12-12 ENCOUNTER — Other Ambulatory Visit: Payer: Self-pay | Admitting: Internal Medicine

## 2021-12-12 DIAGNOSIS — E038 Other specified hypothyroidism: Secondary | ICD-10-CM

## 2021-12-30 DIAGNOSIS — Z9889 Other specified postprocedural states: Secondary | ICD-10-CM | POA: Diagnosis not present

## 2021-12-30 DIAGNOSIS — M1611 Unilateral primary osteoarthritis, right hip: Secondary | ICD-10-CM | POA: Diagnosis not present

## 2022-01-13 ENCOUNTER — Encounter: Payer: Self-pay | Admitting: Gastroenterology

## 2022-01-13 ENCOUNTER — Ambulatory Visit: Payer: Medicare Other | Admitting: Gastroenterology

## 2022-01-13 VITALS — BP 110/70 | HR 76 | Ht 59.0 in | Wt 114.6 lb

## 2022-01-13 DIAGNOSIS — K5180 Other ulcerative colitis without complications: Secondary | ICD-10-CM | POA: Diagnosis not present

## 2022-01-13 MED ORDER — BALSALAZIDE DISODIUM 750 MG PO CAPS: 1500.0000 mg | ORAL_CAPSULE | Freq: Three times a day (TID) | ORAL | 5 refills | Status: DC

## 2022-01-13 NOTE — Patient Instructions (Signed)
Increase Balsalazide 2 capsules three times a day  ? ?We have sent the following medications to your pharmacy for you to pick up at your convenience:   Balsalazide ? ?Follow up in 4 months ? ?If you are age 66 or older, your body mass index should be between 23-30. Your Body mass index is 23.15 kg/m?Marland Kitchen If this is out of the aforementioned range listed, please consider follow up with your Primary Care Provider. ? ?If you are age 61 or younger, your body mass index should be between 19-25. Your Body mass index is 23.15 kg/m?Marland Kitchen If this is out of the aformentioned range listed, please consider follow up with your Primary Care Provider.  ? ?________________________________________________________ ? ?The [facility] providers would like to encourage you to use [facility] to communicate with providers for non-urgent requests or questions.  Due to long hold times on the telephone, sending your provider a message by [facility] may be a faster and more efficient way to get a response.  Please allow 48 business hours for a response.  Please remember that this is for non-urgent requests.  ?_______________________________________________________  ? ?I appreciate the  opportunity to care for you ? ?Thank You  ? ?Harl Bowie , MD  ? ?

## 2022-01-13 NOTE — Progress Notes (Signed)
? ?       ? ?Tammy Kennedy    [id number]    1956-02-18 ? ?Primary Care Physician:Kennedy, Tammy Lick, MD ? ?Referring Physician: Binnie Rail, MD ?[address] [place],  [place] [address] ? ? ?Chief complaint:  Crohn's disease ? ?HPI: ? ?66 year old very pleasant female with history of Crohn's disease involving ileum and colon, history of fistulizing disease with rectovaginal fistula here for follow-up visit ?  ?She was overall doing well on daily mesalamine.  She was tapered off Imuran and was on budesonide during transition ?We had to switch therapy to Balsalazide due to insurance change in coverage, she is having increased bowel frequency but denies any mucus or blood in stool ? ?  ?Denies any nausea, vomiting, abdominal pain, melena or bright red blood per rectum ?Overall doing well from GI standpoint ?  ?Lower back compression fracture with osteoarthritic changes L3-L5, high vitamin D level.   ?Vitamin D level was elevated at 73 in June, has since dropped down to below 30.  She has started not started taking vitamin D supplements. ? ?Colonoscopy April 23, 2020: Mild inactive colitis, quiescent.  Right and left colon biopsies showed benign mucosa with no active inflammation.  Diverticulosis ? ? ?Outpatient Encounter Medications as of 01/13/2022  ?Medication Sig  ? b complex vitamins capsule Take 1 capsule by mouth daily.  ? balsalazide (COLAZAL) 750 MG capsule 1 capsule PO TID  ? calcium-vitamin D (OSCAL WITH D) 500-200 MG-UNIT tablet Take 1 tablet by mouth daily.  ? cholecalciferol (VITAMIN D3) 25 MCG (1000 UNIT) tablet Take 1,000 Units by mouth daily.  ? folic acid (FOLVITE) 1 MG tablet Take 2 mg by mouth daily.  ? levothyroxine (SYNTHROID) 75 MCG tablet TAKE 1 TABLET(75 MCG) BY MOUTH DAILY  ? Potassium Gluconate 550 MG TABS Take 2 tablets by mouth daily.   ? rosuvastatin (CRESTOR) 5 MG tablet Take 1 tablet (5 mg total) by mouth daily.  ? Sennosides (EX-LAX PO) Take by mouth as directed.  ? valACYclovir  (VALTREX) 500 MG tablet TAKE 1 TABLET(500 MG) BY MOUTH DAILY  ? venlafaxine XR (EFFEXOR-XR) 150 MG 24 hr capsule TAKE 1 CAPSULE BY MOUTH EVERY MORNING WITH BREAKFAST  ? venlafaxine XR (EFFEXOR-XR) 75 MG 24 hr capsule TAKE 1 CAPSULE BY MOUTH DAILY WITH BREAKFAST IN ADDITION TO 150 MG DAILY  ? vitamin B-12 (CYANOCOBALAMIN) 500 MCG tablet Take 500 mcg by mouth daily.  ? ?No facility-administered encounter medications on file as of 01/13/2022.  ? ? ?Allergies as of 01/13/2022  ? (No Known Allergies)  ? ? ?Past Medical History:  ?Diagnosis Date  ? Alcohol abuse 05/14/2016  ? ALCOHOL ABUSE, HX OF   ? ANEMIA   ? ANXIETY DISORDER   ? Basal cell carcinoma (BCC) 01/10/2018  ? C. difficile colitis   ? CROHN'S DISEASE   ? DEPRESSION   ? DRY EYE SYNDROME   ? HEPATOMEGALY, HX OF   ? HYPERLIPIDEMIA   ? intol of statin trials  ? HYPOTHYROIDISM   ? OSTEOPENIA   ? Osteoporosis 05/2017  ? states Pre osteoporosis 8/18  ? Rectovaginal fistula 2012  ? chron's disease  ? Tubular adenoma 2013  ? ? ?Past Surgical History:  ?Procedure Laterality Date  ? ANAL FISSURE REPAIR    ? CATARACT EXTRACTION    ? EYE SURGERY  11/2017  ? macular pucker  ? HEMORRHOID SURGERY    ? IR RADIOLOGIST EVAL & MGMT  08/05/2021  ? Left thumb  surgery  1989  ? MOHS SURGERY  2008  ? Nose BCC  ? Theraputic abortion    ? TOTAL HIP ARTHROPLASTY Right   ? 09/2021  ? ? ?Family History  ?Adopted: Yes  ?Problem Relation Age of Onset  ? Breast cancer Neg Hx   ? ? ?Social History  ? ?Socioeconomic History  ? Marital status: Divorced  ?  Spouse name: Not on file  ? Number of children: Not on file  ? Years of education: Not on file  ? Highest education level: Not on file  ?Occupational History  ? Not on file  ?Tobacco Use  ? Smoking status: Never  ? Smokeless tobacco: Never  ?Vaping Use  ? Vaping Use: Never used  ?Substance and Sexual Activity  ? Alcohol use: Yes  ?  Comment: has drank excessively, not drinking daily  ? Drug use: No  ? Sexual activity: Not on file  ?Other Topics  Concern  ? Not on file  ?Social History Narrative  ? Works at [employer]- part time  ? ?Social Determinants of Health  ? ?Financial Resource Strain: Not on file  ?Food Insecurity: Not on file  ?Transportation Needs: Not on file  ?Physical Activity: Not on file  ?Stress: Not on file  ?Social Connections: Not on file  ?Intimate Partner Violence: Not on file  ? ? ? ? ?Review of systems: ?All other review of systems negative except as mentioned in the HPI. ? ? ?Physical Exam: ?Vitals:  ? 01/13/22 0923  ?BP: 110/70  ?Pulse: 76  ? ?Body mass index is 23.15 kg/m?. ?Gen:      No acute distress ?HEENT:  sclera anicteric ?Abd:      soft, non-tender; no palpable masses, no distension ?Ext:    No edema ?Neuro: alert and oriented x 3 ?Psych: normal mood and affect ? ?Data Reviewed: ? ?Reviewed labs, radiology imaging, old records and pertinent past GI work up ? ? ?Assessment and Plan/Recommendations: ? ?66 year-old very pleasant female with history of Crohn's disease rectovaginal fistula currently in remission on mesalamine ?  ?Crohn's disease: Inactive colitis based on recent colonoscopy ?Increased bowel movements, she is currently on low dose, will increase to 2 capsules, 1.5gm Balsalazide TID ? ?She is uptodate with health maintenance ?   ?Return in 4 months or sooner if needed ? ?This visit required 30 minutes of patient care (this includes precharting, chart review, review of results, face-to-face time used for counseling as well as treatment plan and follow-up. The patient was provided an opportunity to ask questions and all were answered. The patient agreed with the plan and demonstrated an understanding of the instructions. ? ?K. Denzil Magnuson , MD ?  ? ?CC: Tammy Rail, MD ? ? ?

## 2022-01-22 ENCOUNTER — Other Ambulatory Visit: Payer: Self-pay | Admitting: Internal Medicine

## 2022-02-13 ENCOUNTER — Other Ambulatory Visit: Payer: Self-pay | Admitting: Internal Medicine

## 2022-03-14 ENCOUNTER — Other Ambulatory Visit: Payer: Self-pay | Admitting: Internal Medicine

## 2022-03-23 ENCOUNTER — Encounter: Payer: Self-pay | Admitting: Internal Medicine

## 2022-03-23 NOTE — Patient Instructions (Addendum)
     Blood work was ordered.     Medications changes include abilify 5 mg daily      Your prescription(s) have been sent to your pharmacy.      Return in about 2 weeks (around 04/07/2022) for follow up.

## 2022-03-23 NOTE — Progress Notes (Signed)
Subjective:    Patient ID: Tammy Kennedy, female    DOB: 1956-06-16, 66 y.o.   MRN: [id number]     HPI Tammy Kennedy is here for follow up of her chronic medical problems, including hypothyroid, hyperglycemia, dyslipidemia, OP, anxiety  Her roommate and friend died since she was here last.  She is having a difficult time with this.  She is drinking some, but not drinking too much.  She has increased anxiety and depression.  She is taking her medication regularly does not want to change it but feels like she needs something else to help with her anxiety.  She wondered about adding something to her current medication to see if that helps.  Medications and allergies reviewed with patient and updated if appropriate.  Current Outpatient Medications on File Prior to Visit  Medication Sig Dispense Refill   b complex vitamins capsule Take 1 capsule by mouth daily.     balsalazide (COLAZAL) 750 MG capsule Take 2 capsules (1,500 mg total) by mouth 3 (three) times daily. 210 capsule 5   Calcium Carbonate-Vitamin D 600-5 MG-MCG TABS Take 1 tablet by mouth daily.     cholecalciferol (VITAMIN D3) 25 MCG (1000 UNIT) tablet Take 1,000 Units by mouth daily.     folic acid (FOLVITE) 1 MG tablet Take 2 mg by mouth daily.     levothyroxine (SYNTHROID) 75 MCG tablet TAKE 1 TABLET(75 MCG) BY MOUTH DAILY 90 tablet 1   Potassium Gluconate 550 MG TABS Take 2 tablets by mouth daily.      rosuvastatin (CRESTOR) 5 MG tablet Take 1 tablet (5 mg total) by mouth daily. 90 tablet 3   Sennosides (EX-LAX PO) Take by mouth as directed.     valACYclovir (VALTREX) 500 MG tablet TAKE 1 TABLET(500 MG) BY MOUTH DAILY 90 tablet 1   venlafaxine XR (EFFEXOR-XR) 150 MG 24 hr capsule TAKE 1 CAPSULE BY MOUTH EVERY MORNING WITH BREAKFAST 90 capsule 1   venlafaxine XR (EFFEXOR-XR) 75 MG 24 hr capsule TAKE 1 CAPSULE BY MOUTH DAILY WITH BREAKFAST IN ADDITION TO 150 MG DAILY 90 capsule 0   vitamin B-12 (CYANOCOBALAMIN) 500 MCG  tablet Take 500 mcg by mouth daily.     No current facility-administered medications on file prior to visit.     Review of Systems  Constitutional:  Positive for fatigue. Negative for chills and fever.  Respiratory:  Positive for cough (allergies). Negative for shortness of breath and wheezing.   Cardiovascular:  Negative for chest pain, palpitations and leg swelling.  Neurological:  Negative for light-headedness and headaches.  Psychiatric/Behavioral:  Positive for dysphoric mood. The patient is nervous/anxious.        Objective:   Vitals:   03/24/22 1358  BP: 118/70  Pulse: 80  Temp: 98 F (36.7 C)  SpO2: 96%   BP Readings from Last 3 Encounters:  03/24/22 118/70  01/13/22 110/70  09/23/21 106/78   Wt Readings from Last 3 Encounters:  03/24/22 113 lb (51.3 kg)  01/13/22 114 lb 9.6 oz (52 kg)  09/23/21 118 lb (53.5 kg)   Body mass index is 22.82 kg/m.    Physical Exam Constitutional:      General: She is not in acute distress.    Appearance: Normal appearance.  HENT:     Head: Normocephalic and atraumatic.  Eyes:     Conjunctiva/sclera: Conjunctivae normal.  Cardiovascular:     Rate and Rhythm: Normal rate and regular rhythm.  Heart sounds: Normal heart sounds. No murmur heard. Pulmonary:     Effort: Pulmonary effort is normal. No respiratory distress.     Breath sounds: Normal breath sounds. No wheezing.  Musculoskeletal:     Cervical back: Neck supple.     Right lower leg: No edema.     Left lower leg: No edema.  Lymphadenopathy:     Cervical: No cervical adenopathy.  Skin:    General: Skin is warm and dry.     Findings: No rash.  Neurological:     Mental Status: She is alert. Mental status is at baseline.  Psychiatric:        Mood and Affect: Mood normal.        Behavior: Behavior normal.        Lab Results  Component Value Date   WBC 8.2 09/23/2021   HGB 13.5 09/23/2021   HCT 40.7 09/23/2021   PLT 232.0 09/23/2021   GLUCOSE 99  09/23/2021   CHOL 216 (H) 07/22/2021   TRIG 230.0 (H) 07/22/2021   HDL 71.20 07/22/2021   LDLDIRECT 129.0 07/22/2021   LDLCALC 108 (H) 06/05/2020   ALT 19 09/23/2021   AST 20 09/23/2021   NA 140 09/23/2021   K 3.7 09/23/2021   CL 105 09/23/2021   CREATININE 0.98 09/23/2021   BUN 17 09/23/2021   CO2 30 09/23/2021   TSH 0.31 (L) 08/01/2021   INR 1.0 09/23/2021   HGBA1C 5.7 09/23/2021     Assessment & Plan:    See Problem List for Assessment and Plan of chronic medical problems.

## 2022-03-24 ENCOUNTER — Ambulatory Visit (INDEPENDENT_AMBULATORY_CARE_PROVIDER_SITE_OTHER): Payer: Medicare Other | Admitting: Internal Medicine

## 2022-03-24 VITALS — BP 118/70 | HR 80 | Temp 98.0°F | Ht 59.0 in | Wt 113.0 lb

## 2022-03-24 DIAGNOSIS — M810 Age-related osteoporosis without current pathological fracture: Secondary | ICD-10-CM

## 2022-03-24 DIAGNOSIS — R739 Hyperglycemia, unspecified: Secondary | ICD-10-CM

## 2022-03-24 DIAGNOSIS — E038 Other specified hypothyroidism: Secondary | ICD-10-CM | POA: Diagnosis not present

## 2022-03-24 DIAGNOSIS — E785 Hyperlipidemia, unspecified: Secondary | ICD-10-CM | POA: Diagnosis not present

## 2022-03-24 DIAGNOSIS — F419 Anxiety disorder, unspecified: Secondary | ICD-10-CM

## 2022-03-24 DIAGNOSIS — F3289 Other specified depressive episodes: Secondary | ICD-10-CM

## 2022-03-24 LAB — HEMOGLOBIN A1C: Hgb A1c MFr Bld: 5.7 % (ref 4.6–6.5)

## 2022-03-24 LAB — COMPREHENSIVE METABOLIC PANEL
ALT: 12 U/L (ref 0–35)
AST: 28 U/L (ref 0–37)
Albumin: 4.2 g/dL (ref 3.5–5.2)
Alkaline Phosphatase: 84 U/L (ref 39–117)
BUN: 18 mg/dL (ref 6–23)
CO2: 32 mEq/L (ref 19–32)
Calcium: 10.4 mg/dL (ref 8.4–10.5)
Chloride: 103 mEq/L (ref 96–112)
Creatinine, Ser: 0.87 mg/dL (ref 0.40–1.20)
GFR: 69.72 mL/min (ref >60.00)
Glucose, Bld: 97 mg/dL (ref 70–99)
Potassium: 4.4 mEq/L (ref 3.5–5.1)
Sodium: 140 mEq/L (ref 135–145)
Total Bilirubin: 0.5 mg/dL (ref 0.2–1.2)
Total Protein: 7 g/dL (ref 6.0–8.3)

## 2022-03-24 LAB — TSH: TSH: 0.31 u[IU]/mL — ABNORMAL LOW (ref 0.35–5.50)

## 2022-03-24 LAB — LDL CHOLESTEROL, DIRECT: Direct LDL: 97 mg/dL

## 2022-03-24 LAB — LIPID PANEL
Cholesterol: 235 mg/dL — ABNORMAL HIGH (ref 0–200)
HDL: 83.1 mg/dL (ref >39.00)
Total CHOL/HDL Ratio: 3
Triglycerides: 568 mg/dL — ABNORMAL HIGH (ref 0.0–149.0)

## 2022-03-24 LAB — VITAMIN D 25 HYDROXY (VIT D DEFICIENCY, FRACTURES): VITD: 51.6 ng/mL (ref 30.00–100.00)

## 2022-03-24 MED ORDER — ARIPIPRAZOLE 5 MG PO TABS: 5.0000 mg | ORAL_TABLET | Freq: Every day | ORAL | 1 refills | Status: DC

## 2022-03-24 NOTE — Assessment & Plan Note (Signed)
Chronic Check vitamin D level

## 2022-03-24 NOTE — Assessment & Plan Note (Signed)
Chronic Regular exercise and healthy diet encouraged Check lipid panel  Continue crestor 5 mg daily

## 2022-03-24 NOTE — Assessment & Plan Note (Signed)
Chronic  Clinically euthyroid Currently taking levothyroxine 75 mcg daily Check tsh  Titrate med dose if needed

## 2022-03-24 NOTE — Assessment & Plan Note (Signed)
Chronic Not ideally controlled Increased depression recently related to friends/family members deaths, but even before this her depression was not ideally controlled He has not wanted to change the Effexor because of the difficulty with tapering off and trying something different She would like to try to add something to her current dose of Effexor to see if that helps We will try Abilify 5 mg daily Continue Effexor to 25 mg daily Follow-up in 2 weeks Discussed possible side effects

## 2022-03-24 NOTE — Assessment & Plan Note (Signed)
Chronic Check a1c Low sugar / carb diet Stressed regular exercise  

## 2022-03-24 NOTE — Assessment & Plan Note (Signed)
Chronic Not controlled Taking Effexor 225 mg daily Increased anxiety recently with more than 1 death of close family/friends We both agree discontinuing Effexor would be very difficult We will try adding Abilify 5 mg daily to see if this helps Follow-up in 2 weeks May need to consider psych referral

## 2022-03-26 MED ORDER — ICOSAPENT ETHYL 1 G PO CAPS: 2.0000 g | ORAL_CAPSULE | Freq: Two times a day (BID) | ORAL | 5 refills | Status: DC

## 2022-03-26 MED ORDER — LEVOTHYROXINE SODIUM 50 MCG PO TABS: 50.0000 ug | ORAL_TABLET | Freq: Every day | ORAL | 3 refills | Status: DC

## 2022-03-26 NOTE — Addendum Note (Signed)
Addended by: Binnie Rail on: 03/26/2022 08:18 PM   Modules accepted: Orders

## 2022-03-27 ENCOUNTER — Encounter: Payer: Self-pay | Admitting: Internal Medicine

## 2022-03-27 ENCOUNTER — Other Ambulatory Visit: Payer: Self-pay

## 2022-03-27 MED ORDER — ARIPIPRAZOLE 5 MG PO TABS: 5.0000 mg | ORAL_TABLET | Freq: Every day | ORAL | 1 refills | Status: DC

## 2022-04-02 DIAGNOSIS — H5212 Myopia, left eye: Secondary | ICD-10-CM | POA: Diagnosis not present

## 2022-04-06 ENCOUNTER — Encounter: Payer: Self-pay | Admitting: Internal Medicine

## 2022-04-07 ENCOUNTER — Ambulatory Visit (INDEPENDENT_AMBULATORY_CARE_PROVIDER_SITE_OTHER): Payer: Medicare Other | Admitting: Internal Medicine

## 2022-04-07 VITALS — BP 130/78 | HR 85 | Temp 99.6°F | Ht 59.0 in | Wt 112.0 lb

## 2022-04-07 DIAGNOSIS — F419 Anxiety disorder, unspecified: Secondary | ICD-10-CM

## 2022-04-07 DIAGNOSIS — F3289 Other specified depressive episodes: Secondary | ICD-10-CM | POA: Diagnosis not present

## 2022-04-07 MED ORDER — CARIPRAZINE HCL 1.5 MG PO CAPS: 1.5000 mg | ORAL_CAPSULE | Freq: Every day | ORAL | 1 refills | Status: DC

## 2022-04-07 NOTE — Assessment & Plan Note (Signed)
Chronic Not controlled Continue Effexor to 25 mg daily We will try adding Vraylar 1.5 mg daily-Abilify was currently not approved by insurance and hopefully this 1 will be approved She is aware of possible side effects Follow-up in 2 months Deferred referral to psychiatry

## 2022-04-21 ENCOUNTER — Telehealth: Payer: Self-pay

## 2022-04-21 NOTE — Telephone Encounter (Signed)
Dickie La ([place])  Your information has been submitted to [place]. [employer] [place] will review the request and notify you of the determination decision directly, typically within 3 business days of your submission and once all necessary information is received.  You will also receive your request decision electronically. To check for an update later, open the request again from your dashboard.  If [employer] [place] has not responded within the specified timeframe or if you have any questions about your PA submission, contact [place] directly at [facility]) [phone] or ([place]) [phone].

## 2022-04-23 DIAGNOSIS — L821 Other seborrheic keratosis: Secondary | ICD-10-CM | POA: Diagnosis not present

## 2022-04-23 DIAGNOSIS — L578 Other skin changes due to chronic exposure to nonionizing radiation: Secondary | ICD-10-CM | POA: Diagnosis not present

## 2022-04-23 DIAGNOSIS — D225 Melanocytic nevi of trunk: Secondary | ICD-10-CM | POA: Diagnosis not present

## 2022-04-23 DIAGNOSIS — L814 Other melanin hyperpigmentation: Secondary | ICD-10-CM | POA: Diagnosis not present

## 2022-05-03 MED ORDER — ARIPIPRAZOLE 5 MG PO TABS: 5.0000 mg | ORAL_TABLET | Freq: Every day | ORAL | 1 refills | Status: DC

## 2022-05-06 ENCOUNTER — Telehealth: Payer: Self-pay

## 2022-05-06 NOTE — Telephone Encounter (Signed)
LVM for pt to call back to set up an apptmnt for an AWV with Dr. Maudie Mercury or to inform us if she has had one with a home visit from a nurse on what date.

## 2022-05-11 ENCOUNTER — Other Ambulatory Visit: Payer: Self-pay | Admitting: Internal Medicine

## 2022-05-11 DIAGNOSIS — Z1231 Encounter for screening mammogram for malignant neoplasm of breast: Secondary | ICD-10-CM

## 2022-05-13 ENCOUNTER — Ambulatory Visit
Admission: RE | Admit: 2022-05-13 | Discharge: 2022-05-13 | Disposition: A | Discharge status: Home / Self Care | Payer: Medicare Other | Source: Ambulatory Visit | Attending: Internal Medicine | Admitting: Internal Medicine

## 2022-05-13 DIAGNOSIS — Z1231 Encounter for screening mammogram for malignant neoplasm of breast: Secondary | ICD-10-CM

## 2022-05-18 ENCOUNTER — Other Ambulatory Visit: Payer: Self-pay | Admitting: Internal Medicine

## 2022-06-01 ENCOUNTER — Ambulatory Visit (INDEPENDENT_AMBULATORY_CARE_PROVIDER_SITE_OTHER): Payer: Medicare Other

## 2022-06-01 ENCOUNTER — Encounter: Payer: Self-pay | Admitting: Internal Medicine

## 2022-06-01 VITALS — BP 116/80 | HR 86 | Temp 97.4°F | Resp 16 | Ht 59.0 in | Wt 112.0 lb

## 2022-06-01 DIAGNOSIS — Z0000 Encounter for general adult medical examination without abnormal findings: Secondary | ICD-10-CM | POA: Diagnosis not present

## 2022-06-01 NOTE — Progress Notes (Addendum)
Subjective:   Tammy Kennedy is a 66 y.o. female who presents for an Initial Medicare Annual Wellness Visit.  Review of Systems     Cardiac Risk Factors include: advanced age (>24mn, >>91women);dyslipidemia;sedentary lifestyle     Objective:    Today's Vitals   06/01/22 1109  BP: 116/80  Pulse: 86  Resp: 16  Temp: (!) 97.4 F (36.3 C)  SpO2: 97%  Weight: 112 lb (50.8 kg)  Height: 4' 11"  (1.499 m)  PainSc: 0-No pain   Body mass index is 22.62 kg/m.     06/01/2022   11:21 AM 07/02/2017    1:20 PM  Advanced Directives  Does Patient Have a Medical Advance Directive? No No  Would patient like information on creating a medical advance directive? No - Patient declined     Current Medications (verified) Outpatient Encounter Medications as of 06/01/2022  Medication Sig   ARIPiprazole (ABILIFY) 5 MG tablet Take 1 tablet (5 mg total) by mouth daily.   b complex vitamins capsule Take 1 capsule by mouth daily.   balsalazide (COLAZAL) 750 MG capsule Take 2 capsules (1,500 mg total) by mouth 3 (three) times daily.   Calcium Carbonate-Vitamin D 600-5 MG-MCG TABS Take 1 tablet by mouth daily.   cholecalciferol (VITAMIN D3) 25 MCG (1000 UNIT) tablet Take 1,000 Units by mouth daily.   folic acid (FOLVITE) 1 MG tablet Take 2 mg by mouth daily.   icosapent Ethyl (VASCEPA) 1 g capsule Take 2 capsules (2 g total) by mouth 2 (two) times daily.   levothyroxine (SYNTHROID) 50 MCG tablet Take 1 tablet (50 mcg total) by mouth daily.   magnesium gluconate (MAGONATE) 500 MG tablet Take 500 mg by mouth daily.   Multiple Vitamins-Minerals (CENTRUM SILVER 50+WOMEN) TABS Take by mouth.   Potassium Gluconate 550 MG TABS Take 2 tablets by mouth daily.    rosuvastatin (CRESTOR) 5 MG tablet Take 1 tablet (5 mg total) by mouth daily.   Sennosides (EX-LAX PO) Take by mouth as directed.   valACYclovir (VALTREX) 500 MG tablet TAKE 1 TABLET(500 MG) BY MOUTH DAILY   venlafaxine XR (EFFEXOR-XR) 150 MG 24  hr capsule TAKE 1 CAPSULE BY MOUTH EVERY MORNING WITH BREAKFAST   venlafaxine XR (EFFEXOR-XR) 75 MG 24 hr capsule TAKE 1 CAPSULE BY MOUTH DAILY WITH BREAKFAST IN ADDITION TO 150 MG DAILY   vitamin B-12 (CYANOCOBALAMIN) 500 MCG tablet Take 500 mcg by mouth daily.   No facility-administered encounter medications on file as of 06/01/2022.    Allergies (verified) Patient has no known allergies.   History: Past Medical History:  Diagnosis Date   Alcohol abuse 05/14/2016   ALCOHOL ABUSE, HX OF    ANEMIA    ANXIETY DISORDER    Basal cell carcinoma (BCC) 01/10/2018   C. difficile colitis    CROHN'S DISEASE    DEPRESSION    DRY EYE SYNDROME    HEPATOMEGALY, HX OF    HYPERLIPIDEMIA    intol of statin trials   HYPOTHYROIDISM    OSTEOPENIA    Osteoporosis 05/2017   states Pre osteoporosis 8/18   Rectovaginal fistula 2012   chron's disease   Tubular adenoma 2013   Past Surgical History:  Procedure Laterality Date   ANAL FISSURE REPAIR     CATARACT EXTRACTION     EYE SURGERY  11/2017   macular pucker   HEMORRHOID SURGERY     IR RADIOLOGIST EVAL & MGMT  08/05/2021   Left thumb surgery  1989  MOHS SURGERY  2008   Nose BCC   Theraputic abortion     TOTAL HIP ARTHROPLASTY Right    09/2021   Family History  Adopted: Yes  Problem Relation Age of Onset   Breast cancer Neg Hx    Social History   Socioeconomic History   Marital status: Divorced    Spouse name: Not on file   Number of children: Not on file   Years of education: Not on file   Highest education level: Not on file  Occupational History   Not on file  Tobacco Use   Smoking status: Never   Smokeless tobacco: Never  Vaping Use   Vaping Use: Never used  Substance and Sexual Activity   Alcohol use: Yes    Comment: has drank excessively, not drinking daily   Drug use: No   Sexual activity: Not on file  Other Topics Concern   Not on file  Social History Narrative   Works at [employer]- part time   Social Determinants of Radio broadcast assistant Strain: [place]  (06/01/2022)   Overall Financial Resource Strain (CARDIA)    Difficulty of Paying Living Expenses: Not hard at all  Food Insecurity: No Food Insecurity (06/01/2022)   Hunger Vital Sign    Worried About Running Out of Food in the Last Year: Never true    [place] in the Last Year: Never true  Transportation Needs: No Transportation Needs (06/01/2022)   PRAPARE - Hydrologist (Medical): No    Lack of Transportation (Non-Medical): No  Physical Activity: Inactive (06/01/2022)   Exercise Vital Sign    Days of Exercise per Week: 0 days    Minutes of Exercise per Session: 0 min  Stress: No Stress Concern Present (06/01/2022)   [place]    Feeling of Stress : Not at all  Social Connections: Moderately Integrated (06/01/2022)   Social Connection and Isolation Panel [NHANES]    Frequency of Communication with Friends and Family: More than three times a week    Frequency of Social Gatherings with Friends and Family: More than three times a week    Attends Religious Services: 1 to 4 times per year    Active Member of [employer] or Organizations: Yes    Attends Archivist Meetings: 1 to 4 times per year    Marital Status: Divorced    Tobacco Counseling Counseling given: Not Answered   Clinical Intake:  Pre-visit preparation completed: Yes  Pain : No/denies pain Pain Score: 0-No pain     BMI - recorded: 22.62 Nutritional Status: BMI of 19-24  Normal Nutritional Risks: Other (Comment) (HX OF CROHN'S DISEASE) Diabetes: No  How often do you need to have someone help you when you read instructions, pamphlets, or other written materials from your doctor or pharmacy?: 1 - Never What is the last grade level you completed in school?: [employer]REE  Diabetic? NO  Interpreter Needed?:  No  Information entered by :: Harun Brumley, LPN.   Activities of Daily Living    06/01/2022   11:37 AM 06/10/2021    3:12 PM  In your present state of health, do you have any difficulty performing the following activities:  Hearing? 1 0  Vision? 0 0  Difficulty concentrating or making decisions? 0 0  Walking or climbing stairs? 0 0  Dressing or bathing? 0 0  Doing errands,  shopping? 0 0  Preparing Food and eating ? N   Using the Toilet? N   In the past six months, have you accidently leaked urine? N   Do you have problems with loss of bowel control? N   Managing your Medications? N   Managing your Finances? N   Housekeeping or managing your Housekeeping? N     Patient Care Team: Binnie Rail, MD as PCP - General (Internal Medicine) Lafayette Dragon, MD (Inactive) as Referring Physician (Gastroenterology)  Indicate any recent Medical Services you may have received from other than [facility] providers in the past year (date may be approximate).     Assessment:   This is a routine wellness examination for [place].  Hearing/Vision screen Hearing Screening - Comments:: Patient has some hearing difficulty and does not wear hearing aids.   Vision Screening - Comments:: Patient does wear prescription readers.  Eye exam done by: Marica Otter, OD.   Dietary issues and exercise activities discussed: Current Exercise Habits: The patient does not participate in regular exercise at present, Exercise limited by: None identified   Goals Addressed             This Visit's Progress    MY GOAL IS TO INCREASE MY PHYSICAL ACTIIVITY AND GET MORE MOTIVATED.        Depression Screen    06/01/2022   11:37 AM 04/07/2022    1:23 PM 06/05/2019    9:03 AM  PHQ 2/9 Scores  PHQ - 2 Score 0 6 5  PHQ- 9 Score  19 15    Fall Risk    06/01/2022   11:36 AM 04/07/2022    1:23 PM 06/10/2021    3:11 PM 06/05/2020    9:34 AM 06/05/2019    8:04 AM  Fall Risk   Falls in the past year? 0 0 0 1 0   Number falls in past yr: 0  0 1 0  Injury with Fall? 0  0 1   Risk for fall due to : No Fall Risks  No Fall Risks    Follow up Falls evaluation completed  Falls evaluation completed      [place]:  Any stairs in or around the home? Yes  If so, are there any without handrails? No  Home free of loose throw rugs in walkways, pet beds, electrical cords, etc? Yes  Adequate lighting in your home to reduce risk of falls? Yes   ASSISTIVE DEVICES UTILIZED TO PREVENT FALLS:  Life alert? No  Use of a cane, walker or w/c? No  Grab bars in the bathroom? No  Shower chair or bench in shower? Yes  Elevated toilet seat or a handicapped toilet? No   TIMED UP AND GO:  Was the test performed? Yes .  Length of time to ambulate 10 feet: 6 sec.   Gait steady and fast without use of assistive device  Cognitive Function: Normal cognitive status assessed by direct observation by this Nurse Health Advisor. No abnormalities found.         06/01/2022   11:38 AM  6CIT Screen  What Year? 0 points  What month? 0 points  What time? 0 points  Count back from 20 0 points  Months in reverse 0 points  Repeat phrase 0 points  Total Score 0 points    Immunizations Immunization History  Administered Date(s) Administered   PFIZER(Purple Top)SARS-COV-2 Vaccination 01/05/2020, 01/26/2020, 08/24/2020   PNEUMOCOCCAL CONJUGATE-20 06/10/2021  Pension scheme manager 35yr & up 08/22/2021   Pneumococcal Conjugate-13 04/24/2015   Pneumococcal Polysaccharide-23 03/15/2013   Td 07/25/2007   Tdap 06/05/2019    TDAP status: Up to date  Flu Vaccine status: Declined, Education has been provided regarding the importance of this vaccine but patient still declined. Advised may receive this vaccine at local pharmacy or Health Dept. Aware to provide a copy of the vaccination record if obtained from local pharmacy or Health Dept. Verbalized acceptance and  understanding.  Pneumococcal vaccine status: Up to date  Covid-19 vaccine status: Completed vaccines  Qualifies for Shingles Vaccine? Yes   Zostavax completed No   Shingrix Completed?: No.    Education has been provided regarding the importance of this vaccine. Patient has been advised to call insurance company to determine out of pocket expense if they have not yet received this vaccine. Advised may also receive vaccine at local pharmacy or Health Dept. Verbalized acceptance and understanding.  Screening Tests Health Maintenance  Topic Date Due   COVID-19 Vaccine (5 - Pfizer risk series) 10/17/2021   INFLUENZA VACCINE  05/12/2022   Zoster Vaccines- Shingrix (1 of 2) 07/08/2022 (Originally 05/31/1975)   COLONOSCOPY (Pts [id number] coverage will need to be confirmed)  04/24/2023   DEXA SCAN  06/07/2023   MAMMOGRAM  05/13/2024   TETANUS/TDAP  06/04/2029   Pneumonia Vaccine 6550Years old  Completed   Hepatitis C Screening  Completed   HPV VACCINES  Aged Out    Health Maintenance  Health Maintenance Due  Topic Date Due   COVID-19 Vaccine (5 - Pfizer risk series) 10/17/2021   INFLUENZA VACCINE  05/12/2022    Colorectal cancer screening: Type of screening: Colonoscopy. Completed 04/23/2020. Repeat every 3 years  Mammogram status: Completed 05/13/2022. Repeat every year  Bone Density status: Completed 06/06/2021. Results reflect: Bone density results: OSTEOPENIA. Repeat every 2 years.  Lung Cancer Screening: (Low Dose CT Chest recommended if Age 721-80ears, 30 pack-year currently smoking OR have quit w/in 15years.) does not qualify.   Lung Cancer Screening Referral: NO  Additional Screening:  Hepatitis C Screening: does qualify; Completed 11/16/2016  Vision Screening: Recommended annual ophthalmology exams for early detection of glaucoma and other disorders of the eye. Is the patient up to date with their annual eye exam?  Yes  Who is the provider or what is the name of  the office in which the patient attends annual eye exams? SALLY MILLER, OD. If pt is not established with a provider, would they like to be referred to a provider to establish care? No .   Dental Screening: Recommended annual dental exams for proper oral hygiene  Community Resource Referral / Chronic Care Management: CRR required this visit?  No   CCM required this visit?  No      Plan:     I have personally reviewed and noted the following in the patient's chart:   Medical and social history Use of alcohol, tobacco or illicit drugs  Current medications and supplements including opioid prescriptions. Patient is not currently taking opioid prescriptions. Functional ability and status Nutritional status Physical activity Advanced directives List of other physicians Hospitalizations, surgeries, and ER visits in previous 12 months Vitals Screenings to include cognitive, depression, and falls Referrals and appointments  In addition, I have reviewed and discussed with patient certain preventive protocols, quality metrics, and best practice recommendations. A written personalized care plan for preventive services as well as general preventive health recommendations were provided to  patient.     Sheral Flow, LPN   [id number]   Nurse Notes:  Hearing Screening - Comments:: Patient has some hearing difficulty and does not wear hearing aids.   Vision Screening - Comments:: Patient does wear prescription readers.  Eye exam done by: Marica Otter, OD.

## 2022-06-01 NOTE — Patient Instructions (Addendum)
     Blood work was ordered.     Medications changes include : none      A referral was ordered for a hearing evaluation    Return in about 6 months (around 12/03/2022) for Physical Exam.

## 2022-06-01 NOTE — Progress Notes (Unsigned)
Subjective:    Patient ID: Tammy Kennedy, female    DOB: Dec 01, 1955, 66 y.o.   MRN: [id number]     HPI Amna is here for follow up of her chronic medical problems, including anxiety, depression, hypothyroidism, hypertrigs  She had a cold over the weekend - has residual hoarseness.  Symptoms otherwise better.  Still has drainage.   Since starting the abilify she is much happier - depression and anxiety are much better.   Medications and allergies reviewed with patient and updated if appropriate.  Current Outpatient Medications on File Prior to Visit  Medication Sig Dispense Refill   ARIPiprazole (ABILIFY) 5 MG tablet Take 1 tablet (5 mg total) by mouth daily. 30 tablet 1   b complex vitamins capsule Take 1 capsule by mouth daily.     balsalazide (COLAZAL) 750 MG capsule Take 2 capsules (1,500 mg total) by mouth 3 (three) times daily. 210 capsule 5   Calcium Carbonate-Vitamin D 600-5 MG-MCG TABS Take 1 tablet by mouth daily.     cholecalciferol (VITAMIN D3) 25 MCG (1000 UNIT) tablet Take 1,000 Units by mouth daily.     folic acid (FOLVITE) 1 MG tablet Take 2 mg by mouth daily.     levothyroxine (SYNTHROID) 50 MCG tablet Take 1 tablet (50 mcg total) by mouth daily. 90 tablet 3   magnesium gluconate (MAGONATE) 500 MG tablet Take 500 mg by mouth daily.     Multiple Vitamins-Minerals (CENTRUM SILVER 50+WOMEN) TABS Take by mouth.     Potassium Gluconate 550 MG TABS Take 2 tablets by mouth daily.      rosuvastatin (CRESTOR) 5 MG tablet Take 1 tablet (5 mg total) by mouth daily. 90 tablet 3   Sennosides (EX-LAX PO) Take by mouth as directed.     valACYclovir (VALTREX) 500 MG tablet TAKE 1 TABLET(500 MG) BY MOUTH DAILY 90 tablet 1   venlafaxine XR (EFFEXOR-XR) 150 MG 24 hr capsule TAKE 1 CAPSULE BY MOUTH EVERY MORNING WITH BREAKFAST 90 capsule 1   venlafaxine XR (EFFEXOR-XR) 75 MG 24 hr capsule TAKE 1 CAPSULE BY MOUTH DAILY WITH BREAKFAST IN ADDITION TO 150 MG DAILY 90 capsule 0    vitamin B-12 (CYANOCOBALAMIN) 500 MCG tablet Take 500 mcg by mouth daily.     No current facility-administered medications on file prior to visit.     Review of Systems     Objective:   Vitals:   06/02/22 1458  BP: 112/76  Pulse: 83  Temp: 98 F (36.7 C)  SpO2: 99%   BP Readings from Last 3 Encounters:  06/02/22 112/76  06/01/22 116/80  04/07/22 130/78   Wt Readings from Last 3 Encounters:  06/02/22 112 lb (50.8 kg)  06/01/22 112 lb (50.8 kg)  04/07/22 112 lb (50.8 kg)   Body mass index is 22.62 kg/m.    Physical Exam Constitutional:      General: She is not in acute distress.    Appearance: Normal appearance. She is not ill-appearing.  HENT:     Head: Normocephalic and atraumatic.  Skin:    General: Skin is warm and dry.  Neurological:     Mental Status: She is alert. Mental status is at baseline.  Psychiatric:        Mood and Affect: Mood normal.        Behavior: Behavior normal.        Thought Content: Thought content normal.        Judgment: Judgment normal.  Lab Results  Component Value Date   WBC 8.2 09/23/2021   HGB 13.5 09/23/2021   HCT 40.7 09/23/2021   PLT 232.0 09/23/2021   GLUCOSE 97 03/24/2022   CHOL 235 (H) 03/24/2022   TRIG (H) 03/24/2022    568.0 Triglyceride is over 400; calculations on Lipids are invalid.   HDL 83.10 03/24/2022   LDLDIRECT 97.0 03/24/2022   LDLCALC 108 (H) 06/05/2020   ALT 12 03/24/2022   AST 28 03/24/2022   NA 140 03/24/2022   K 4.4 03/24/2022   CL 103 03/24/2022   CREATININE 0.87 03/24/2022   BUN 18 03/24/2022   CO2 32 03/24/2022   TSH 0.31 (L) 03/24/2022   INR 1.0 09/23/2021   HGBA1C 5.7 03/24/2022     Assessment & Plan:    See Problem List for Assessment and Plan of chronic medical problems.

## 2022-06-01 NOTE — Patient Instructions (Signed)
Tammy Kennedy , Thank you for taking time to come for your Medicare Wellness Visit. I appreciate your ongoing commitment to your health goals. Please review the following plan we discussed and let me know if I can assist you in the future.   Screening recommendations/referrals: Colonoscopy: 04/23/2020; due every 3 years (due 04/24/2023) Mammogram: 05/13/2022; due ever year (due 05/14/2023) Bone Density: 06/06/2021; due every 2 years (due 06/07/2023) Recommended yearly ophthalmology/optometry visit for glaucoma screening and checkup Recommended yearly dental visit for hygiene and checkup  Vaccinations: Influenza vaccine: declined Pneumococcal vaccine: 03/15/2013 (Pneumococcal 23), 06/10/2021 (Prevnar 13, Prevnar 20) Tdap vaccine: 06/05/2019; due every 10 years (due 06/04/2029) Shingles vaccine: due; recommend Shingrix which is 2 doses 2-6 months apart and over 90% effective     Covid-19: 01/05/2020, 01/26/2020, 08/24/2020, 08/22/2021  Advanced directives: No  Conditions/risks identified: Yes  Next appointment: Please schedule your next Medicare Wellness Visit with your Nurse Health Advisor in 1 year by calling [phone].   Preventive Care 66 Years and Older, Female Preventive care refers to lifestyle choices and visits with your health care provider that can promote health and wellness. What does preventive care include? A yearly physical exam. This is also called an annual well check. Dental exams once or twice a year. Routine eye exams. Ask your health care provider how often you should have your eyes checked. Personal lifestyle choices, including: Daily care of your teeth and gums. Regular physical activity. Eating a healthy diet. Avoiding tobacco and drug use. Limiting alcohol use. Practicing safe sex. Taking low-dose aspirin every day. Taking vitamin and mineral supplements as recommended by your health care provider. What happens during an annual well check? The services and screenings  done by your health care provider during your annual well check will depend on your age, overall health, lifestyle risk factors, and family history of disease. Counseling  Your health care provider may ask you questions about your: Alcohol use. Tobacco use. Drug use. Emotional well-being. Home and relationship well-being. Sexual activity. Eating habits. History of falls. Memory and ability to understand (cognition). Work and work Statistician. Reproductive health. Screening  You may have the following tests or measurements: Height, weight, and BMI. Blood pressure. Lipid and cholesterol levels. These may be checked every 5 years, or more frequently if you are over 26 years old. Skin check. Lung cancer screening. You may have this screening every year starting at age 74 if you have a 30-pack-year history of smoking and currently smoke or have quit within the past 15 years. Fecal occult blood test (FOBT) of the stool. You may have this test every year starting at age 50. Flexible sigmoidoscopy or colonoscopy. You may have a sigmoidoscopy every 5 years or a colonoscopy every 10 years starting at age 58. Hepatitis C blood test. Hepatitis B blood test. Sexually transmitted disease (STD) testing. Diabetes screening. This is done by checking your blood sugar (glucose) after you have not eaten for a while (fasting). You may have this done every 1-3 years. Bone density scan. This is done to screen for osteoporosis. You may have this done starting at age 70. Mammogram. This may be done every 1-2 years. Talk to your health care provider about how often you should have regular mammograms. Talk with your health care provider about your test results, treatment options, and if necessary, the need for more tests. Vaccines  Your health care provider may recommend certain vaccines, such as: Influenza vaccine. This is recommended every year. Tetanus, diphtheria, and acellular pertussis (  Tdap, Td)  vaccine. You may need a Td booster every 10 years. Zoster vaccine. You may need this after age 61. Pneumococcal 13-valent conjugate (PCV13) vaccine. One dose is recommended after age 70. Pneumococcal polysaccharide (PPSV23) vaccine. One dose is recommended after age 15. Talk to your health care provider about which screenings and vaccines you need and how often you need them. This information is not intended to replace advice given to you by your health care provider. Make sure you discuss any questions you have with your health care provider. Document Released: 10/25/2015 Document Revised: 06/17/2016 Document Reviewed: 07/30/2015 Elsevier Interactive Patient Education  2017 [place] Prevention in the Home Falls can cause injuries. They can happen to people of all ages. There are many things you can do to make your home safe and to help prevent falls. What can I do on the outside of my home? Regularly fix the edges of walkways and driveways and fix any cracks. Remove anything that might make you trip as you walk through a door, such as a raised step or threshold. Trim any bushes or trees on the path to your home. Use bright outdoor lighting. Clear any walking paths of anything that might make someone trip, such as rocks or tools. Regularly check to see if handrails are loose or broken. Make sure that both sides of any steps have handrails. Any raised decks and porches should have guardrails on the edges. Have any leaves, snow, or ice cleared regularly. Use sand or salt on walking paths during winter. Clean up any spills in your garage right away. This includes oil or grease spills. What can I do in the bathroom? Use night lights. Install grab bars by the toilet and in the tub and shower. Do not use towel bars as grab bars. Use non-skid mats or decals in the tub or shower. If you need to sit down in the shower, use a plastic, non-slip stool. Keep the floor dry. Clean up any  water that spills on the floor as soon as it happens. Remove soap buildup in the tub or shower regularly. Attach bath mats securely with double-sided non-slip rug tape. Do not have throw rugs and other things on the floor that can make you trip. What can I do in the bedroom? Use night lights. Make sure that you have a light by your bed that is easy to reach. Do not use any sheets or blankets that are too big for your bed. They should not hang down onto the floor. Have a firm chair that has side arms. You can use this for support while you get dressed. Do not have throw rugs and other things on the floor that can make you trip. What can I do in the kitchen? Clean up any spills right away. Avoid walking on wet floors. Keep items that you use a lot in easy-to-reach places. If you need to reach something above you, use a strong step stool that has a grab bar. Keep electrical cords out of the way. Do not use floor polish or wax that makes floors slippery. If you must use wax, use non-skid floor wax. Do not have throw rugs and other things on the floor that can make you trip. What can I do with my stairs? Do not leave any items on the stairs. Make sure that there are handrails on both sides of the stairs and use them. Fix handrails that are broken or loose. Make sure that handrails are  as long as the stairways. Check any carpeting to make sure that it is firmly attached to the stairs. Fix any carpet that is loose or worn. Avoid having throw rugs at the top or bottom of the stairs. If you do have throw rugs, attach them to the floor with carpet tape. Make sure that you have a light switch at the top of the stairs and the bottom of the stairs. If you do not have them, ask someone to add them for you. What else can I do to help prevent falls? Wear shoes that: Do not have high heels. Have rubber bottoms. Are comfortable and fit you well. Are closed at the toe. Do not wear sandals. If you use a  stepladder: Make sure that it is fully opened. Do not climb a closed stepladder. Make sure that both sides of the stepladder are locked into place. Ask someone to hold it for you, if possible. Clearly mark and make sure that you can see: Any grab bars or handrails. First and last steps. Where the edge of each step is. Use tools that help you move around (mobility aids) if they are needed. These include: Canes. Walkers. Scooters. Crutches. Turn on the lights when you go into a dark area. Replace any light bulbs as soon as they burn out. Set up your furniture so you have a clear path. Avoid moving your furniture around. If any of your floors are uneven, fix them. If there are any pets around you, be aware of where they are. Review your medicines with your doctor. Some medicines can make you feel dizzy. This can increase your chance of falling. Ask your doctor what other things that you can do to help prevent falls. This information is not intended to replace advice given to you by your health care provider. Make sure you discuss any questions you have with your health care provider. Document Released: 07/25/2009 Document Revised: 03/05/2016 Document Reviewed: 11/02/2014 Elsevier Interactive Patient Education  2017 [employer].

## 2022-06-02 ENCOUNTER — Ambulatory Visit (INDEPENDENT_AMBULATORY_CARE_PROVIDER_SITE_OTHER): Payer: Medicare Other | Admitting: Internal Medicine

## 2022-06-02 VITALS — BP 112/76 | HR 83 | Temp 98.0°F | Ht 59.0 in | Wt 112.0 lb

## 2022-06-02 DIAGNOSIS — H9193 Unspecified hearing loss, bilateral: Secondary | ICD-10-CM | POA: Diagnosis not present

## 2022-06-02 DIAGNOSIS — F419 Anxiety disorder, unspecified: Secondary | ICD-10-CM | POA: Diagnosis not present

## 2022-06-02 DIAGNOSIS — E038 Other specified hypothyroidism: Secondary | ICD-10-CM | POA: Diagnosis not present

## 2022-06-02 DIAGNOSIS — F3289 Other specified depressive episodes: Secondary | ICD-10-CM

## 2022-06-02 DIAGNOSIS — E785 Hyperlipidemia, unspecified: Secondary | ICD-10-CM

## 2022-06-02 LAB — TSH: TSH: 6.92 u[IU]/mL — ABNORMAL HIGH (ref 0.35–5.50)

## 2022-06-02 MED ORDER — OMEGA 3 1200 MG PO CAPS: 1200.0000 mg | ORAL_CAPSULE | Freq: Every day | ORAL | Status: DC

## 2022-06-02 NOTE — Assessment & Plan Note (Signed)
Chronic Improved controlled Continue effexor 225 mg daily Continue Abilify 5 mg daily in am

## 2022-06-02 NOTE — Assessment & Plan Note (Signed)
Chronic  Clinically euthyroid Currently taking levothyroxine 50 mcg Check tsh  Titrate med dose if needed

## 2022-06-02 NOTE — Assessment & Plan Note (Signed)
Chronic High trigs vascepa too expensive Will start otc omega 3 Recheck in 6 months

## 2022-06-02 NOTE — Assessment & Plan Note (Signed)
Chronic Improved controlled Continue effexor 225 mg daily Continue Abilify 5 mg daily in am - causes a little drowsiness but overall has increased energy

## 2022-06-03 MED ORDER — LEVOTHYROXINE SODIUM 75 MCG PO TABS: 75.0000 ug | ORAL_TABLET | Freq: Every day | ORAL | 3 refills | Status: DC

## 2022-06-03 NOTE — Addendum Note (Signed)
Addended by: Binnie Rail on: 06/03/2022 09:26 PM   Modules accepted: Orders

## 2022-06-23 ENCOUNTER — Other Ambulatory Visit: Payer: Self-pay | Admitting: Internal Medicine

## 2022-07-01 ENCOUNTER — Ambulatory Visit: Payer: Medicare Other | Attending: Audiologist | Admitting: Audiologist

## 2022-07-01 DIAGNOSIS — H903 Sensorineural hearing loss, bilateral: Secondary | ICD-10-CM | POA: Insufficient documentation

## 2022-07-01 NOTE — Procedures (Signed)
  [facility] [place], [place]  [address] [phone]  AUDIOLOGICAL  EVALUATION  NAME: Tammy Kennedy     DOB:   [birth year]      MRN: [id number]                                                                                     DATE: 07/01/2022     REFERENT: Binnie Rail, MD STATUS: Outpatient DIAGNOSIS: Sensorineural Hearing Loss Bilatearl    History: Rosellen was seen for an audiological evaluation.  Miniya would like a baseline hearing test. Angelika has no concerns for her hearing, but would like to have a hearing test to know where her hearing is now to track future changes. No pain or pressure reported in either ear. Tinnitus denied in both ears. Vyla has a history no significant noise exposure. No rick factors for hearing loss found in medical history. No other relevant case history reported.   Evaluation:  Otoscopy showed a clear view of the tympanic membranes, bilaterally Tympanometry results were consistent with normal middle ear function, bilaterally   Audiometric testing was completed using conventional audiometry with insert transducer. Speech Recognition Thresholds were  30dB in the right ear and 30dB in the left ear. Word Recognition was performed  70dB HL, scored  100% in the right ear and 100% in the left ear. Pure tone thresholds show normal hearing sloping to moderate high pitched sensorineural hearing loss in each ear.   Results:  The test results were reviewed with Charlett Nose. Chamya has normal hearing sloping to moderate high pitched sensorineural hearing loss bilaterally. She does not feel this is impacting her on a significant basis. Recommend she just practice healthy communication, face to face within five feet. Start monitoring hearing with a hearing test every other year. If she has increased difficulty hearing, or new symptoms then return sooner.   Recommendations: 1.   Audiologic monitoring is recommended  every other year or sooner if concerns arise.  reported understanding.    22 minutes spent testing and counseling on results.   Alfonse Alpers  Audiologist, Au.D., CCC-A 07/01/2022  10:16 AM  Cc: Binnie Rail, MD

## 2022-07-16 ENCOUNTER — Encounter: Payer: Self-pay | Admitting: Internal Medicine

## 2022-07-21 ENCOUNTER — Other Ambulatory Visit: Payer: Self-pay | Admitting: Internal Medicine

## 2022-08-04 ENCOUNTER — Other Ambulatory Visit: Payer: Self-pay | Admitting: Internal Medicine

## 2022-08-04 NOTE — Progress Notes (Signed)
Order(s) created erroneously. Erroneous order ID: [id number]  Order canceled by: CHART CORRECTION ANALYST TWELVE, IDENTITY  Order cancel date/time: 08/04/2022 1:07 PM

## 2022-08-12 ENCOUNTER — Other Ambulatory Visit: Payer: Self-pay | Admitting: Internal Medicine

## 2022-08-18 ENCOUNTER — Other Ambulatory Visit: Payer: Self-pay

## 2022-08-18 ENCOUNTER — Encounter: Payer: Self-pay | Admitting: Internal Medicine

## 2022-08-18 MED ORDER — PANTOPRAZOLE SODIUM 40 MG PO TBEC: DELAYED_RELEASE_TABLET | ORAL | 3 refills | Status: DC

## 2022-09-09 ENCOUNTER — Other Ambulatory Visit: Payer: Self-pay | Admitting: Internal Medicine

## 2022-09-22 DIAGNOSIS — M1611 Unilateral primary osteoarthritis, right hip: Secondary | ICD-10-CM | POA: Diagnosis not present

## 2022-11-04 ENCOUNTER — Other Ambulatory Visit: Payer: Self-pay | Admitting: Gastroenterology

## 2022-11-16 ENCOUNTER — Other Ambulatory Visit: Payer: Self-pay | Admitting: Internal Medicine

## 2022-11-25 DIAGNOSIS — K080 Exfoliation of teeth due to systemic causes: Secondary | ICD-10-CM | POA: Diagnosis not present

## 2022-12-03 ENCOUNTER — Encounter: Payer: Self-pay | Admitting: Internal Medicine

## 2022-12-03 NOTE — Patient Instructions (Addendum)
Blood work was ordered.   The lab is on the first floor.    Medications changes include :   none     Return in about 6 months (around 06/04/2023) for follow up.     [facility] [place]   Sandford Craze - clinical social worker/therapist -  [phone]  [facility] [place] [phone]   [place]  [address] [phone]  [facility] [place] [phone] Office  [facility]            [place] Maintenance, Female Adopting a healthy lifestyle and getting preventive care are important in promoting health and wellness. Ask your health care provider about: The right schedule for you to have regular tests and exams. Things you can do on your own to prevent diseases and keep yourself healthy. What should I know about diet, weight, and exercise? Eat a healthy diet  Eat a diet that includes plenty of vegetables, fruits, low-fat dairy products, and lean protein. Do not eat a lot of foods that are high in solid fats, added sugars, or sodium. Maintain a healthy weight Body mass index (BMI) is used to identify weight problems. It estimates body fat based on height and weight. Your health care provider can help determine your BMI and help you achieve or maintain a healthy weight. Get regular exercise Get regular exercise. This is one of the most important things you can do for your health. Most adults should: Exercise for at least 150 minutes each week. The exercise should increase your heart rate and make you sweat (moderate-intensity exercise). Do strengthening exercises at least twice a week. This is in addition to the moderate-intensity exercise. Spend less time sitting. Even light physical activity can be  beneficial. Watch cholesterol and blood lipids Have your blood tested for lipids and cholesterol at 67 years of age, then have this test every 5 years. Have your cholesterol levels checked more often if: Your lipid or cholesterol levels are high. You are older than 67 years of age. You are at high risk for heart disease. What should I know about cancer screening? Depending on your health history and family history, you may need to have cancer screening at various ages. This may include screening for: Breast cancer. Cervical cancer. Colorectal cancer. Skin cancer. Lung cancer. What should I know about heart disease, diabetes, and high blood pressure? Blood pressure and heart disease High blood pressure causes heart disease and increases the risk of stroke. This is more likely to develop in people who have high blood pressure readings or are overweight. Have your blood pressure checked: Every 3-5 years if you are 58-9 years of age. Every year if you are 81 years old or older. Diabetes Have regular diabetes screenings. This checks your fasting blood sugar level. Have the screening done: Once every three years after age 16 if you are at a normal weight and have a low risk for diabetes. More often and at a younger age if you are overweight or have a high risk for diabetes. What should I know about preventing infection? Hepatitis B If you have a higher risk for hepatitis B, you should be screened for this virus. Talk with your health care provider to find out if you are at risk for  hepatitis B infection. Hepatitis C Testing is recommended for: Everyone born from [birth year] through 1965. Anyone with known risk factors for hepatitis C. Sexually transmitted infections (STIs) Get screened for STIs, including gonorrhea and chlamydia, if: You are sexually active and are younger than 68 years of age. You are older than 67 years of age and your health care provider tells you that you are at risk for  this type of infection. Your sexual activity has changed since you were last screened, and you are at increased risk for chlamydia or gonorrhea. Ask your health care provider if you are at risk. Ask your health care provider about whether you are at high risk for HIV. Your health care provider may recommend a prescription medicine to help prevent HIV infection. If you choose to take medicine to prevent HIV, you should first get tested for HIV. You should then be tested every 3 months for as long as you are taking the medicine. Pregnancy If you are about to stop having your period (premenopausal) and you may become pregnant, seek counseling before you get pregnant. Take 400 to 800 micrograms (mcg) of folic acid every day if you become pregnant. Ask for birth control (contraception) if you want to prevent pregnancy. Osteoporosis and menopause Osteoporosis is a disease in which the bones lose minerals and strength with aging. This can result in bone fractures. If you are 65 years old or older, or if you are at risk for osteoporosis and fractures, ask your health care provider if you should: Be screened for bone loss. Take a calcium or vitamin D supplement to lower your risk of fractures. Be given hormone replacement therapy (HRT) to treat symptoms of menopause. Follow these instructions at home: Alcohol use Do not drink alcohol if: Your health care provider tells you not to drink. You are pregnant, may be pregnant, or are planning to become pregnant. If you drink alcohol: Limit how much you have to: 0-1 drink a day. Know how much alcohol is in your drink. In the [place] one drink equals one 12 oz bottle of beer (355 mL), one 5 oz glass of wine (148 mL), or one 1 oz glass of hard liquor (44 mL). Lifestyle Do not use any products that contain nicotine or tobacco. These products include cigarettes, chewing tobacco, and vaping devices, such as e-cigarettes. If you need help quitting, ask your health  care provider. Do not use street drugs. Do not share needles. Ask your health care provider for help if you need support or information about quitting drugs. General instructions Schedule regular health, dental, and eye exams. Stay current with your vaccines. Tell your health care provider if: You often feel depressed. You have ever been abused or do not feel safe at home. Summary Adopting a healthy lifestyle and getting preventive care are important in promoting health and wellness. Follow your health care provider's instructions about healthy diet, exercising, and getting tested or screened for diseases. Follow your health care provider's instructions on monitoring your cholesterol and blood pressure. This information is not intended to replace advice given to you by your health care provider. Make sure you discuss any questions you have with your health care provider. Document Revised: 02/17/2021 Document Reviewed: 02/17/2021 Elsevier Patient Education  [place].

## 2022-12-03 NOTE — Progress Notes (Signed)
Subjective:    Patient ID: Tammy Kennedy, female    DOB: [birth year], 67 y.o.   MRN: [id number]      HPI Ciin is here for a Physical exam and follow-up of her chronic medical problems.   She has been feeling a little dizziness - feels lightheadedness and some spinning sensation.  The room will spin when she gets up in the morning, when she lays down to go to bed.  The spinning can linger for a while.  If she reaches up she can get a little.  This week it is a little better.  She has been drinking a lot of fluids this week, but states last week she may have been a little dehydrated.  She denies any cold symptoms.   She is feeling a little depressed.  She is still grieving her friend that died last year.  She is interested in a referral to see a therapist.  Medications and allergies reviewed with patient and updated if appropriate.  Current Outpatient Medications on File Prior to Visit  Medication Sig Dispense Refill   ARIPiprazole (ABILIFY) 5 MG tablet TAKE 1 TABLET(5 MG) BY MOUTH DAILY 30 tablet 5   b complex vitamins capsule Take 1 capsule by mouth daily.     balsalazide (COLAZAL) 750 MG capsule TAKE 2 CAPSULES(1500 MG) BY MOUTH THREE TIMES DAILY 210 capsule 5   Calcium Carbonate-Vitamin D 600-5 MG-MCG TABS Take 1 tablet by mouth daily.     cholecalciferol (VITAMIN D3) 25 MCG (1000 UNIT) tablet Take 1,000 Units by mouth daily.     folic acid (FOLVITE) 1 MG tablet Take 2 mg by mouth daily.     levothyroxine (SYNTHROID) 75 MCG tablet Take 1 tablet (75 mcg total) by mouth daily. 90 tablet 3   magnesium gluconate (MAGONATE) 500 MG tablet Take 500 mg by mouth daily.     Multiple Vitamins-Minerals (CENTRUM SILVER 50+WOMEN) TABS Take by mouth.     Omega 3 1200 MG CAPS Take 1 capsule (1,200 mg total) by mouth daily.     pantoprazole (PROTONIX) 40 MG tablet Take twice daily for 1-2 weeks and then decrease to once daily.  Take 30 minutes prior to a meal 60 tablet 3   Potassium Gluconate  550 MG TABS Take 2 tablets by mouth daily.      rosuvastatin (CRESTOR) 5 MG tablet TAKE 1 TABLET(5 MG) BY MOUTH DAILY 90 tablet 3   Sennosides (EX-LAX PO) Take by mouth as directed.     valACYclovir (VALTREX) 500 MG tablet TAKE 1 TABLET(500 MG) BY MOUTH DAILY 90 tablet 1   venlafaxine XR (EFFEXOR-XR) 150 MG 24 hr capsule TAKE 1 CAPSULE BY MOUTH EVERY MORNING WITH BREAKFAST 90 capsule 1   venlafaxine XR (EFFEXOR-XR) 75 MG 24 hr capsule TAKE 1 CAPSULE BY MOUTH DAILY WITH BREAKFAST IN ADDITION TO 150 MG DAILY 90 capsule 0   vitamin B-12 (CYANOCOBALAMIN) 500 MCG tablet Take 500 mcg by mouth daily.     No current facility-administered medications on file prior to visit.    Review of Systems  Constitutional:  Negative for fever.  HENT:  Positive for congestion. Negative for ear pain, sinus pressure, sinus pain and sore throat.   Eyes:  Negative for visual disturbance.  Respiratory:  Negative for cough, shortness of breath and wheezing.   Cardiovascular:  Negative for chest pain, palpitations and leg swelling.  Gastrointestinal:  Negative for abdominal pain, blood in stool, constipation, diarrhea and nausea.  No gerd  Genitourinary:  Negative for dysuria.  Musculoskeletal:  Positive for back pain (lower back). Negative for arthralgias.  Skin:  Negative for rash.  Neurological:  Positive for dizziness and light-headedness. Negative for headaches.  Psychiatric/Behavioral:  Positive for dysphoric mood. Negative for sleep disturbance. The patient is nervous/anxious.        Objective:   Vitals:   12/04/22 1108  BP: 124/76  Pulse: 76  Temp: 98.9 F (37.2 C)  SpO2: 98%   Filed Weights   12/04/22 1108  Weight: 119 lb (54 kg)   Body mass index is 24.04 kg/m.  BP Readings from Last 3 Encounters:  12/04/22 124/76  06/02/22 112/76  06/01/22 116/80    Wt Readings from Last 3 Encounters:  12/04/22 119 lb (54 kg)  06/02/22 112 lb (50.8 kg)  06/01/22 112 lb (50.8 kg)        Physical Exam Constitutional: She appears well-developed and well-nourished. No distress.  HENT:  Head: Normocephalic and atraumatic.  Right Ear: External ear normal. Normal ear canal and TM Left Ear: External ear normal.  Normal ear canal and TM Mouth/Throat: Oropharynx is clear and moist.  Eyes: Conjunctivae normal.  Neck: Neck supple. No tracheal deviation present. No thyromegaly present.  No carotid bruit  Cardiovascular: Normal rate, regular rhythm and normal heart sounds.   No murmur heard.  No edema. Pulmonary/Chest: Effort normal and breath sounds normal. No respiratory distress. She has no wheezes. She has no rales.  Breast: deferred   Abdominal: Soft. She exhibits no distension. There is no tenderness.  Lymphadenopathy: She has no cervical adenopathy.  Skin: Skin is warm and dry. She is not diaphoretic.  Psychiatric: She has a normal mood and affect. Her behavior is normal.     Lab Results  Component Value Date   WBC 8.2 09/23/2021   HGB 13.5 09/23/2021   HCT 40.7 09/23/2021   PLT 232.0 09/23/2021   GLUCOSE 97 03/24/2022   CHOL 235 (H) 03/24/2022   TRIG (H) 03/24/2022    568.0 Triglyceride is over 400; calculations on Lipids are invalid.   HDL 83.10 03/24/2022   LDLDIRECT 97.0 03/24/2022   LDLCALC 108 (H) 06/05/2020   ALT 12 03/24/2022   AST 28 03/24/2022   NA 140 03/24/2022   K 4.4 03/24/2022   CL 103 03/24/2022   CREATININE 0.87 03/24/2022   BUN 18 03/24/2022   CO2 32 03/24/2022   TSH 6.92 (H) 06/02/2022   INR 1.0 09/23/2021   HGBA1C 5.7 03/24/2022         Assessment & Plan:   Physical exam: Screening blood work  ordered Exercise  walking or stationary bike Weight  normal.  Substance abuse  none   Reviewed recommended immunizations.   Health Maintenance  Topic Date Due   Zoster Vaccines- Shingrix (1 of 2) Never done   COVID-19 Vaccine (5 - 2023-24 season) 12/20/2022 (Originally 06/12/2022)   INFLUENZA VACCINE  01/10/2023 (Originally  05/12/2022)   COLONOSCOPY (Pts 45-37yr Insurance coverage will need to be confirmed)  04/24/2023   Medicare Annual Wellness (AWV)  06/02/2023   DEXA SCAN  06/07/2023   MAMMOGRAM  05/13/2024   DTaP/Tdap/Td (3 - Td or Tdap) 06/04/2029   Pneumonia Vaccine 67 Years old  Completed   Hepatitis C Screening  Completed   HPV VACCINES  Aged Out          See Problem List for Assessment and Plan of chronic medical problems.

## 2022-12-04 ENCOUNTER — Encounter: Payer: Self-pay | Admitting: Internal Medicine

## 2022-12-04 ENCOUNTER — Ambulatory Visit (INDEPENDENT_AMBULATORY_CARE_PROVIDER_SITE_OTHER): Payer: Medicare Other | Admitting: Internal Medicine

## 2022-12-04 VITALS — BP 124/76 | HR 76 | Temp 98.9°F | Ht 59.0 in | Wt 119.0 lb

## 2022-12-04 DIAGNOSIS — Z85828 Personal history of other malignant neoplasm of skin: Secondary | ICD-10-CM | POA: Insufficient documentation

## 2022-12-04 DIAGNOSIS — E559 Vitamin D deficiency, unspecified: Secondary | ICD-10-CM

## 2022-12-04 DIAGNOSIS — L570 Actinic keratosis: Secondary | ICD-10-CM | POA: Insufficient documentation

## 2022-12-04 DIAGNOSIS — K50919 Crohn's disease, unspecified, with unspecified complications: Secondary | ICD-10-CM | POA: Diagnosis not present

## 2022-12-04 DIAGNOSIS — L814 Other melanin hyperpigmentation: Secondary | ICD-10-CM | POA: Insufficient documentation

## 2022-12-04 DIAGNOSIS — E038 Other specified hypothyroidism: Secondary | ICD-10-CM

## 2022-12-04 DIAGNOSIS — H8110 Benign paroxysmal vertigo, unspecified ear: Secondary | ICD-10-CM

## 2022-12-04 DIAGNOSIS — F419 Anxiety disorder, unspecified: Secondary | ICD-10-CM | POA: Diagnosis not present

## 2022-12-04 DIAGNOSIS — D2271 Melanocytic nevi of right lower limb, including hip: Secondary | ICD-10-CM | POA: Insufficient documentation

## 2022-12-04 DIAGNOSIS — M810 Age-related osteoporosis without current pathological fracture: Secondary | ICD-10-CM

## 2022-12-04 DIAGNOSIS — L578 Other skin changes due to chronic exposure to nonionizing radiation: Secondary | ICD-10-CM | POA: Insufficient documentation

## 2022-12-04 DIAGNOSIS — D1801 Hemangioma of skin and subcutaneous tissue: Secondary | ICD-10-CM | POA: Insufficient documentation

## 2022-12-04 DIAGNOSIS — E785 Hyperlipidemia, unspecified: Secondary | ICD-10-CM

## 2022-12-04 DIAGNOSIS — K219 Gastro-esophageal reflux disease without esophagitis: Secondary | ICD-10-CM

## 2022-12-04 DIAGNOSIS — Z0000 Encounter for general adult medical examination without abnormal findings: Secondary | ICD-10-CM | POA: Diagnosis not present

## 2022-12-04 DIAGNOSIS — R739 Hyperglycemia, unspecified: Secondary | ICD-10-CM

## 2022-12-04 DIAGNOSIS — F3289 Other specified depressive episodes: Secondary | ICD-10-CM

## 2022-12-04 DIAGNOSIS — D225 Melanocytic nevi of trunk: Secondary | ICD-10-CM | POA: Insufficient documentation

## 2022-12-04 LAB — LIPID PANEL
Cholesterol: 222 mg/dL — ABNORMAL HIGH (ref 0–200)
HDL: 70.6 mg/dL (ref >39.00)
NonHDL: 151.49
Total CHOL/HDL Ratio: 3
Triglycerides: 385 mg/dL — ABNORMAL HIGH (ref 0.0–149.0)
VLDL: 77 mg/dL — ABNORMAL HIGH (ref 0.0–40.0)

## 2022-12-04 LAB — COMPREHENSIVE METABOLIC PANEL
ALT: 17 U/L (ref 0–35)
AST: 41 U/L — ABNORMAL HIGH (ref 0–37)
Albumin: 4.4 g/dL (ref 3.5–5.2)
Alkaline Phosphatase: 91 U/L (ref 39–117)
BUN: 15 mg/dL (ref 6–23)
CO2: 28 mEq/L (ref 19–32)
Calcium: 10.4 mg/dL (ref 8.4–10.5)
Chloride: 102 mEq/L (ref 96–112)
Creatinine, Ser: 0.76 mg/dL (ref 0.40–1.20)
GFR: 81.6 mL/min (ref >60.00)
Glucose, Bld: 84 mg/dL (ref 70–99)
Potassium: 4.4 mEq/L (ref 3.5–5.1)
Sodium: 139 mEq/L (ref 135–145)
Total Bilirubin: 0.7 mg/dL (ref 0.2–1.2)
Total Protein: 7.3 g/dL (ref 6.0–8.3)

## 2022-12-04 LAB — CBC WITH DIFFERENTIAL/PLATELET
Basophils Absolute: 0.1 10*3/uL (ref 0.0–0.1)
Basophils Relative: 1.4 % (ref 0.0–3.0)
Eosinophils Absolute: 0.1 10*3/uL (ref 0.0–0.7)
Eosinophils Relative: 1.9 % (ref 0.0–5.0)
HCT: 45.5 % (ref 36.0–46.0)
Hemoglobin: 15.1 g/dL — ABNORMAL HIGH (ref 12.0–15.0)
Lymphocytes Relative: 18.3 % (ref 12.0–46.0)
Lymphs Abs: 1.1 10*3/uL (ref 0.7–4.0)
MCHC: 33.2 g/dL (ref 30.0–36.0)
MCV: 95.5 fl (ref 78.0–100.0)
Monocytes Absolute: 0.4 10*3/uL (ref 0.1–1.0)
Monocytes Relative: 7.2 % (ref 3.0–12.0)
Neutro Abs: 4.2 10*3/uL (ref 1.4–7.7)
Neutrophils Relative %: 71.2 % (ref 43.0–77.0)
Platelets: 271 10*3/uL (ref 150.0–400.0)
RBC: 4.77 Mil/uL (ref 3.87–5.11)
RDW: 12.4 % (ref 11.5–15.5)
WBC: 5.9 10*3/uL (ref 4.0–10.5)

## 2022-12-04 LAB — VITAMIN D 25 HYDROXY (VIT D DEFICIENCY, FRACTURES): VITD: 27.56 ng/mL — ABNORMAL LOW (ref 30.00–100.00)

## 2022-12-04 LAB — VITAMIN B12: Vitamin B-12: 398 pg/mL (ref 211–911)

## 2022-12-04 LAB — LDL CHOLESTEROL, DIRECT: Direct LDL: 106 mg/dL

## 2022-12-04 LAB — HEMOGLOBIN A1C: Hgb A1c MFr Bld: 5.8 % (ref 4.6–6.5)

## 2022-12-04 LAB — TSH: TSH: 0.08 u[IU]/mL — ABNORMAL LOW (ref 0.35–5.50)

## 2022-12-04 NOTE — Assessment & Plan Note (Signed)
Chronic Check a1c Low sugar / carb diet Stressed regular exercise  

## 2022-12-04 NOTE — Assessment & Plan Note (Addendum)
Chronic Controlled, Stable Continue Effexor 225 mg daily, Abilify 5 mg daily

## 2022-12-04 NOTE — Assessment & Plan Note (Signed)
Chronic GERD controlled Continue pantoprazole 40 mg 1-2 times a day

## 2022-12-04 NOTE — Assessment & Plan Note (Signed)
Chronic  Clinically euthyroid Check tsh and will titrate med dose if needed Currently taking levothyroxine 75 mcg daily

## 2022-12-04 NOTE — Assessment & Plan Note (Signed)
Chronic Regular exercise and healthy diet encouraged Check lipid panel  Continue Crestor 5 mg daily

## 2022-12-04 NOTE — Assessment & Plan Note (Signed)
Chronic Controlled Has colonoscopy coming up this summer Management per Dr. Silverio Decamp

## 2022-12-04 NOTE — Assessment & Plan Note (Signed)
Chronic Taking vitamin D daily Check vitamin D level  

## 2022-12-04 NOTE — Assessment & Plan Note (Signed)
Chronic DEXA up-to-date Has been on Boniva in the past and had side effects Not interested in trying any other medication Can discuss after DEXA later this year She is exercising regularly-continue Continue calcium and vitamin D supplementation Check vitamin D level

## 2022-12-04 NOTE — Assessment & Plan Note (Signed)
Acute Dizziness symptoms she is describing are suggestive of BPPV-no neurological symptoms, cold symptoms and exam is normal Symptoms occur with changes in position and head movements Symptoms have improved Continue good hydration Discussed that she can try over-the-counter nondrowsy Dramamine or Antivert Deferred referral for PT now, but if symptoms do not improve/resolve advise she can let me know if she wants referral

## 2022-12-04 NOTE — Assessment & Plan Note (Signed)
Chronic Not ideally controlled-she is experiencing some depression Some of that is related to grieving her friend that died last year Interested in seeing a therapist-referral to behavioral health Continue Effexor to 25 mg daily, Abilify 5 mg daily

## 2022-12-06 MED ORDER — LEVOTHYROXINE SODIUM 75 MCG PO TABS: 75.0000 ug | ORAL_TABLET | Freq: Every day | ORAL | 3 refills | Status: DC

## 2022-12-06 NOTE — Addendum Note (Signed)
Addended by: Binnie Rail on: 12/06/2022 08:42 AM   Modules accepted: Orders

## 2022-12-11 IMAGING — DX DG KNEE COMPLETE 4+V*R*
4 series · 4 of 4 positions shown · non-contrast
Comparison: None.

CLINICAL DATA: Pain

EXAM:
RIGHT KNEE - COMPLETE 4+ VIEW

[knee ap]
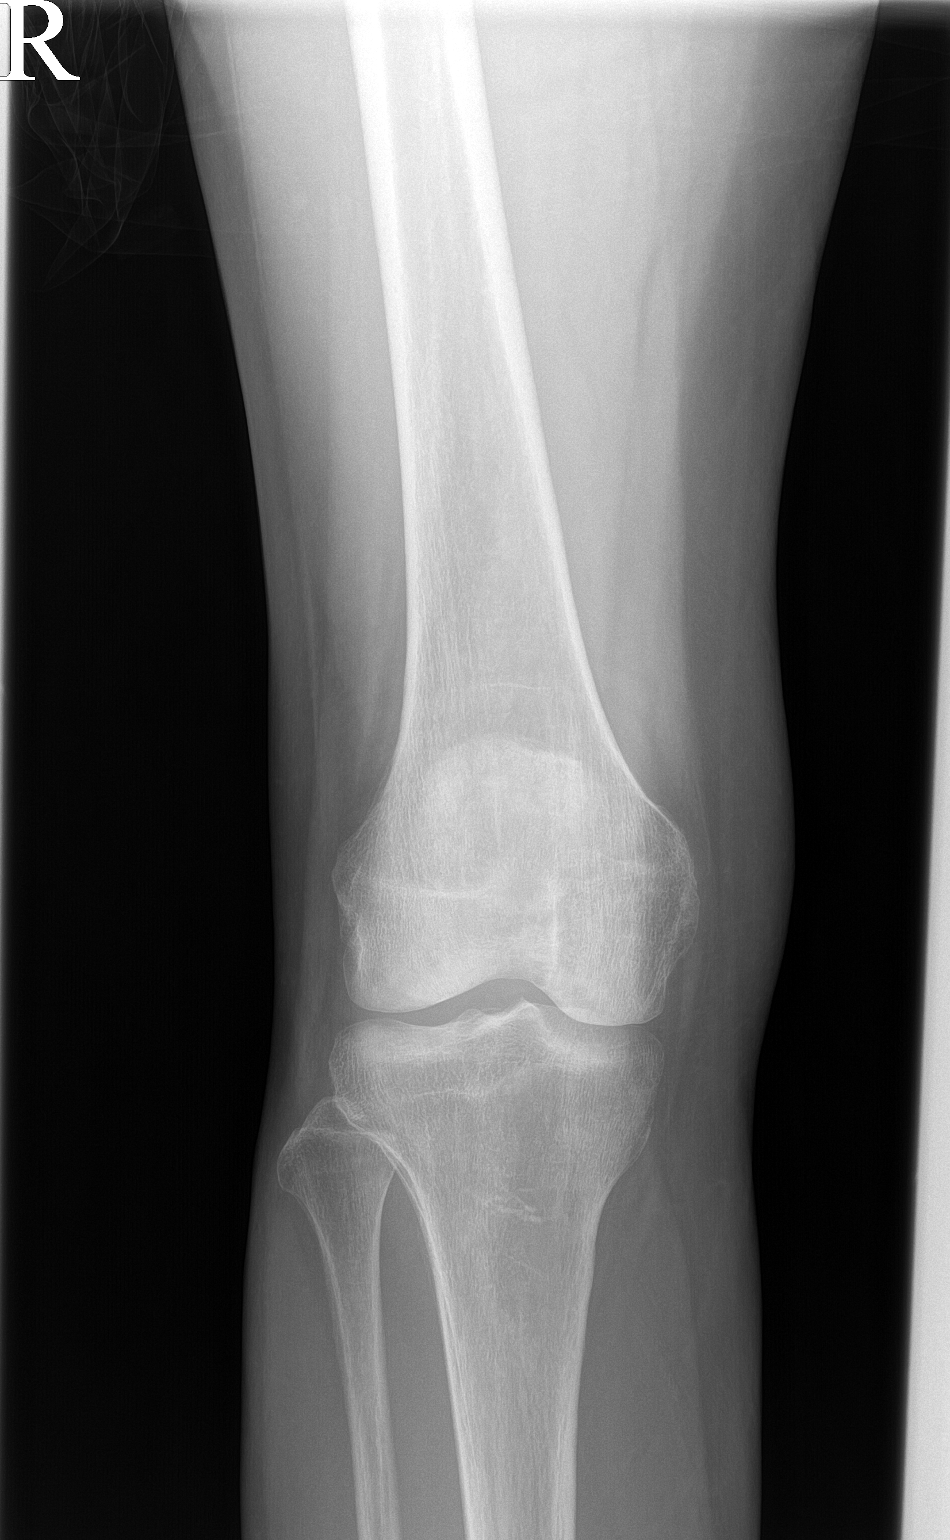

[knee obl (1 of 2)]
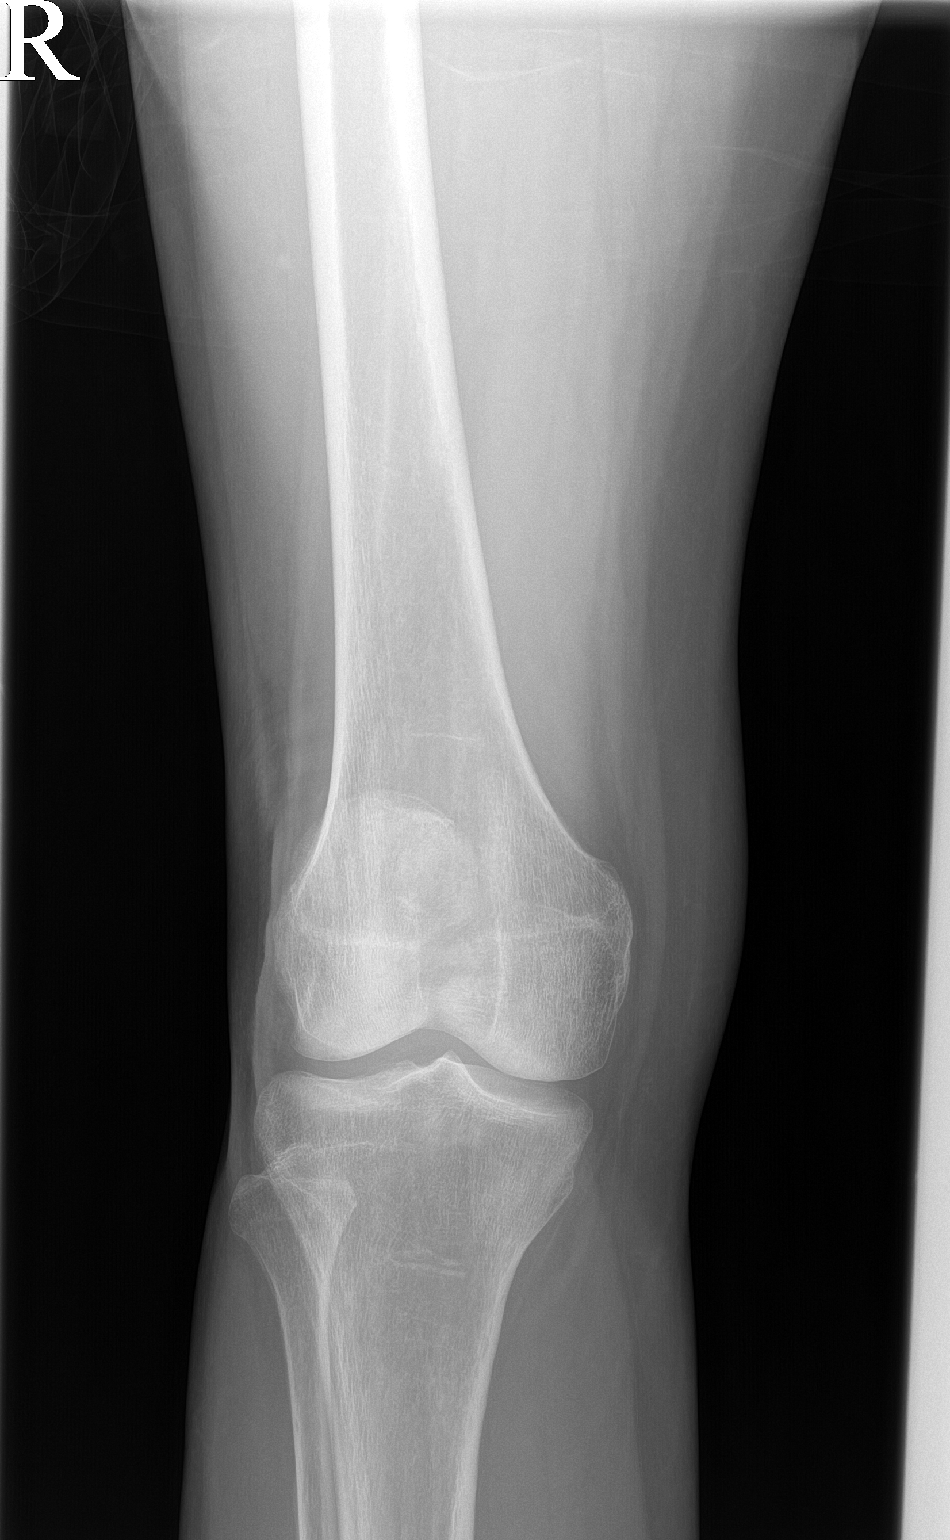

[knee obl (2 of 2)]
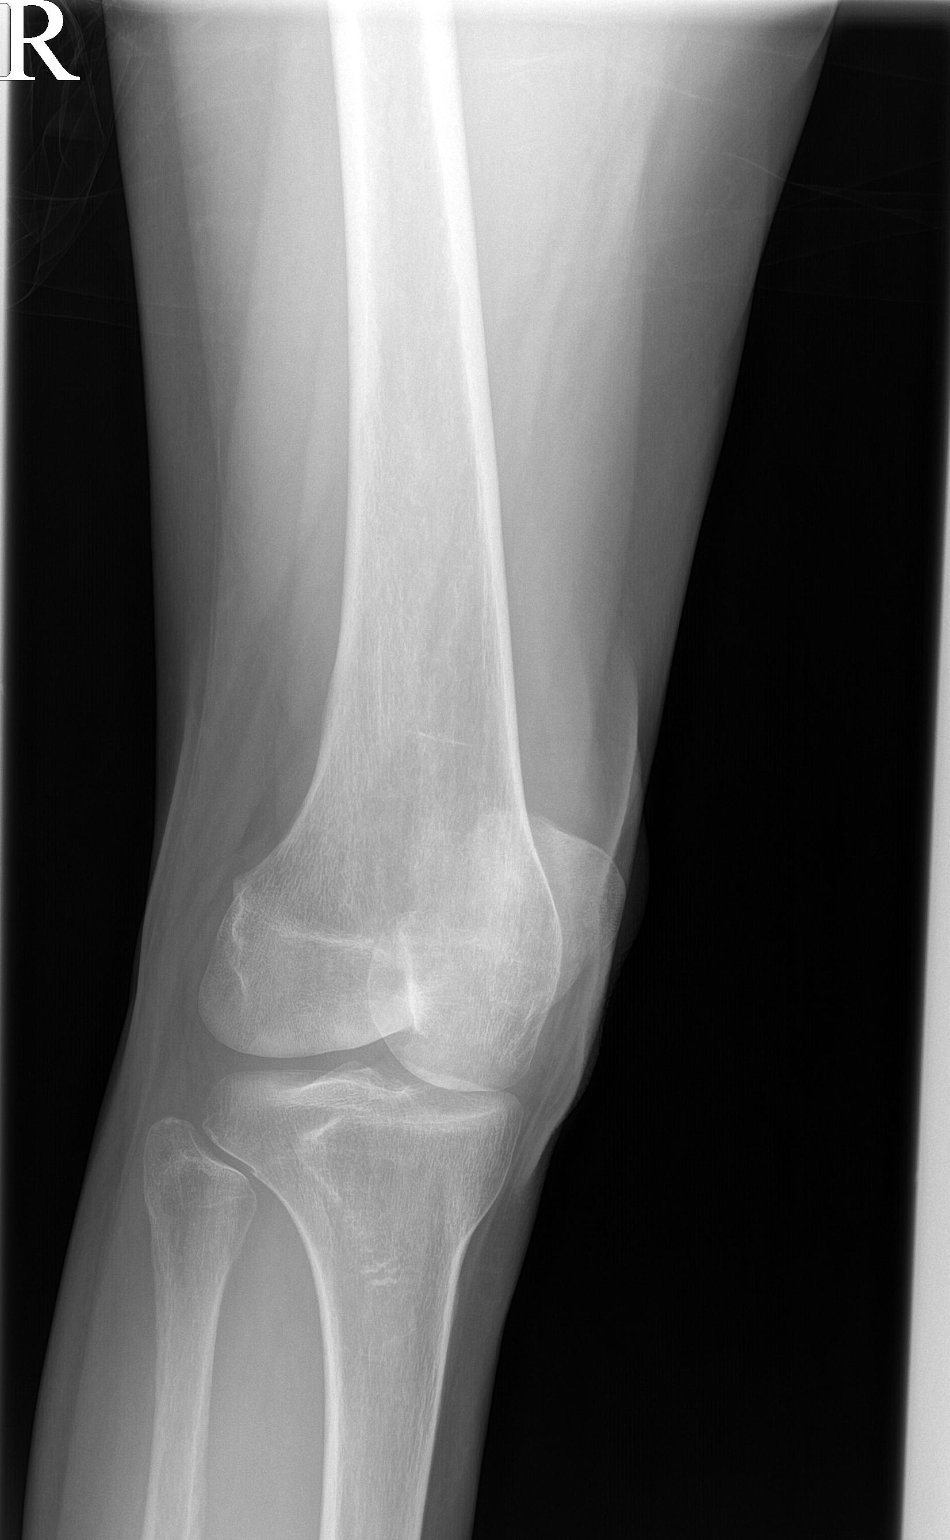

[knee lat]
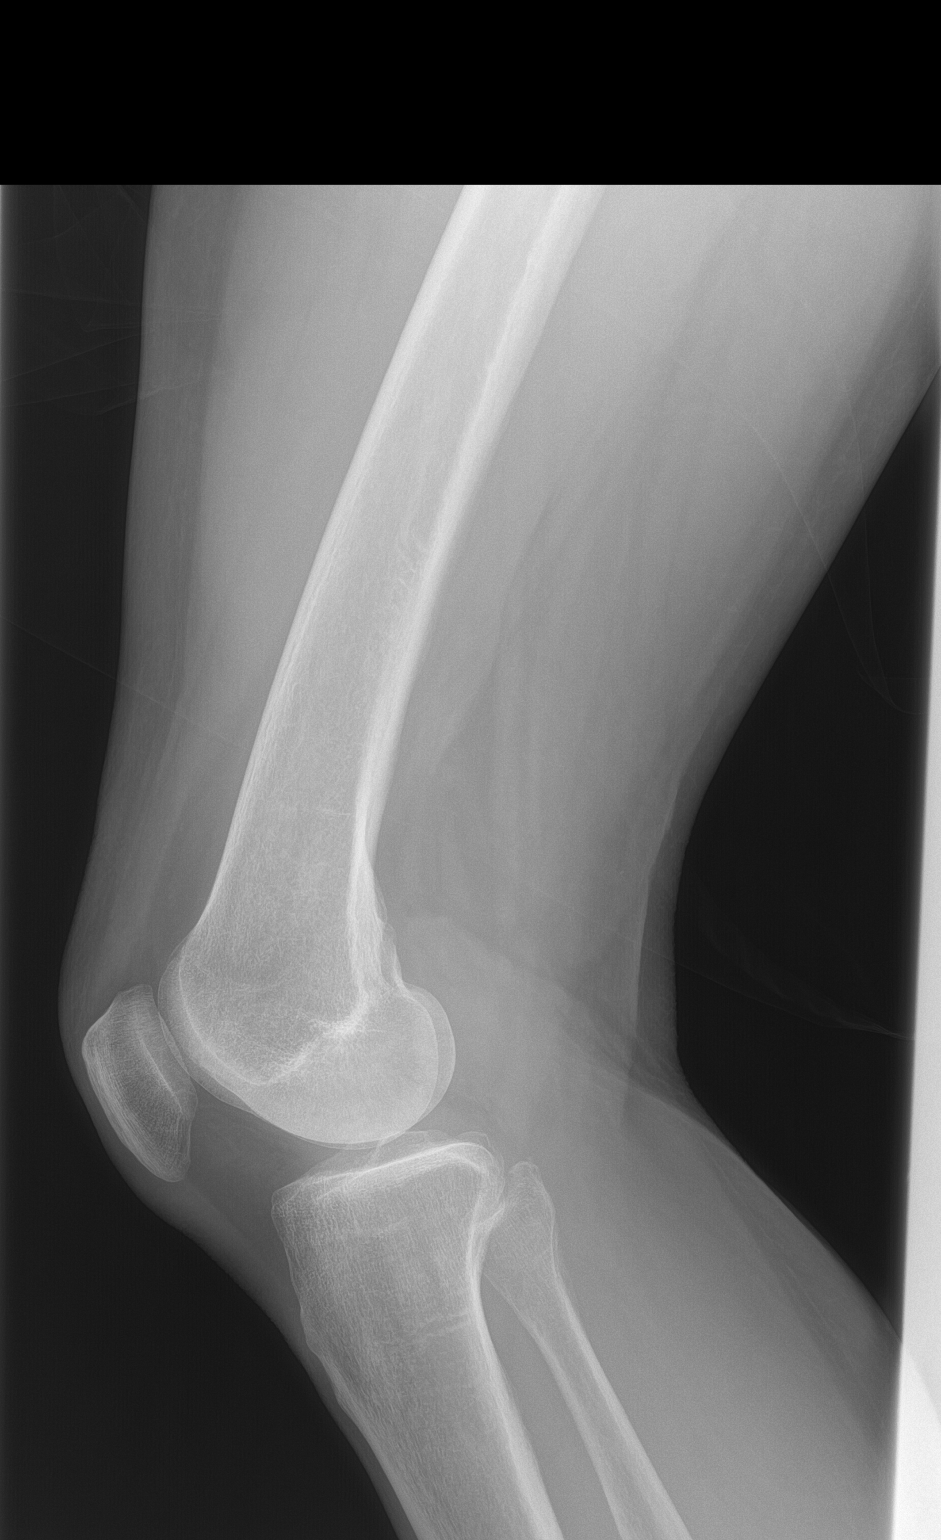

[4 of 4 positions shown; findings below may reference images not displayed]

FINDINGS: Frontal, lateral, and bilateral oblique views were obtained. No
evident fracture or dislocation. No joint effusion. Joint spaces
appear normal. No erosive change.
IMPRESSION: No fracture, dislocation, or joint effusion. No appreciable
arthropathy.

## 2023-01-21 IMAGING — MG MM DIGITAL SCREENING BILAT W/ TOMO AND CAD
8 series · 8 of 24 positions shown · non-contrast
Comparison: Previous exam(s).

CLINICAL DATA: Screening.

EXAM:
DIGITAL SCREENING BILATERAL MAMMOGRAM WITH TOMOSYNTHESIS AND CAD
TECHNIQUE: Bilateral screening digital craniocaudal and mediolateral oblique
mammograms were obtained. Bilateral screening digital breast
tomosynthesis was performed. The images were evaluated with
computer-aided detection.

[R CC synth-2D]
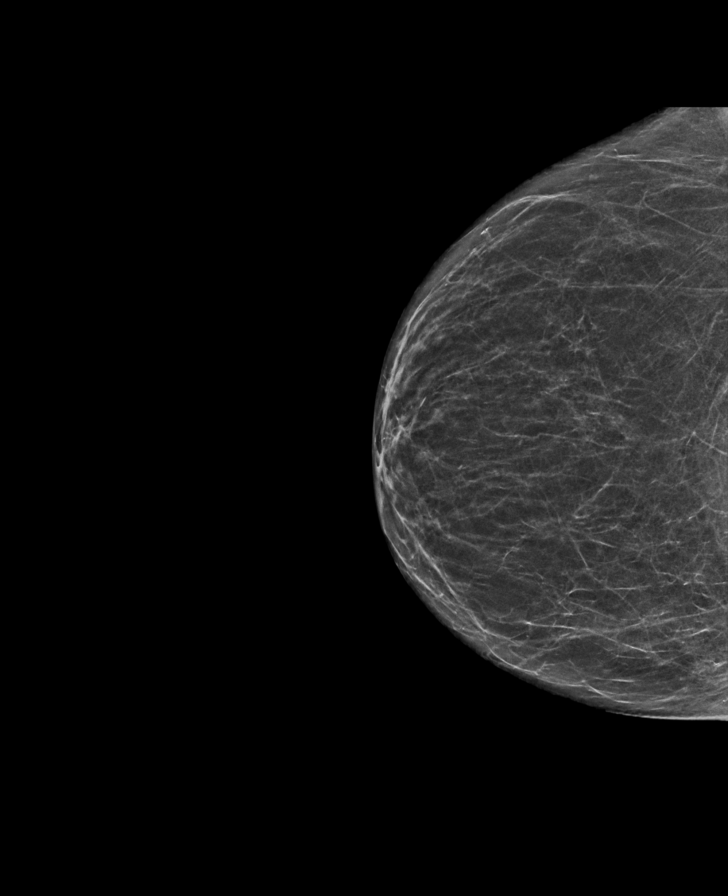

[R MLO synth-2D]
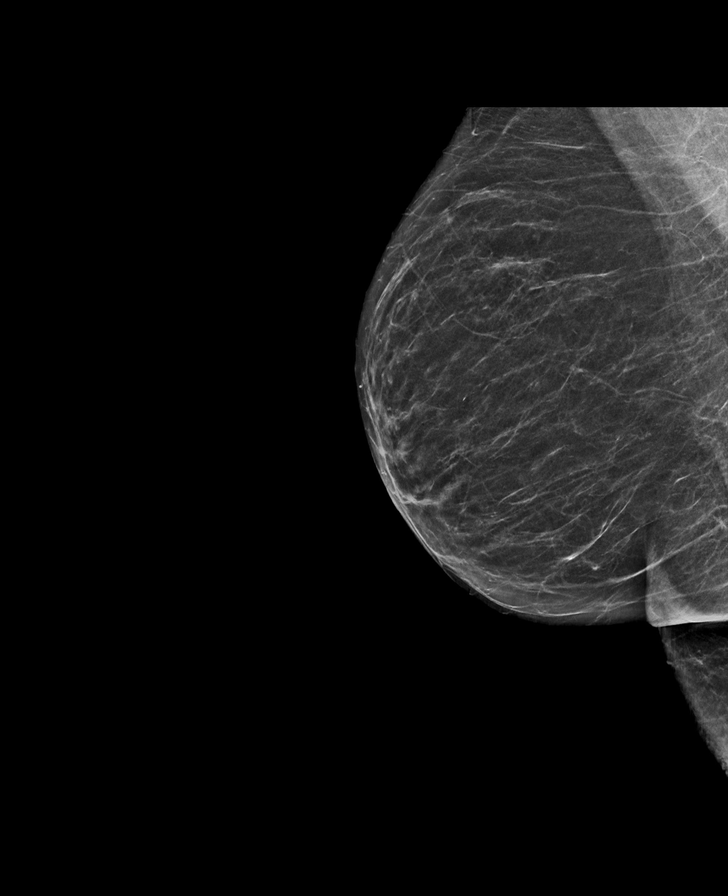

[L CC synth-2D]
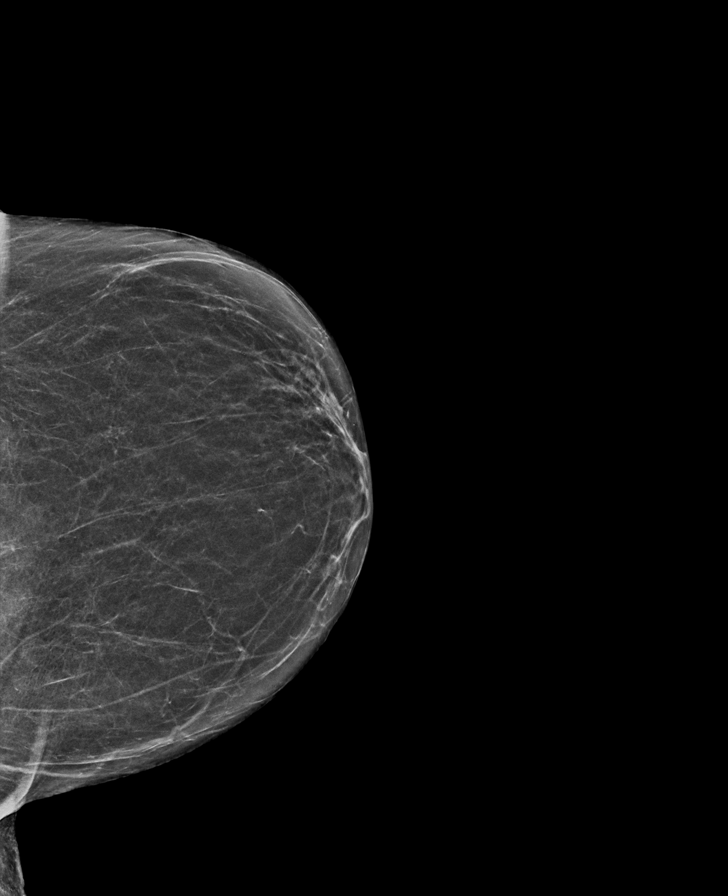

[L MLO synth-2D]
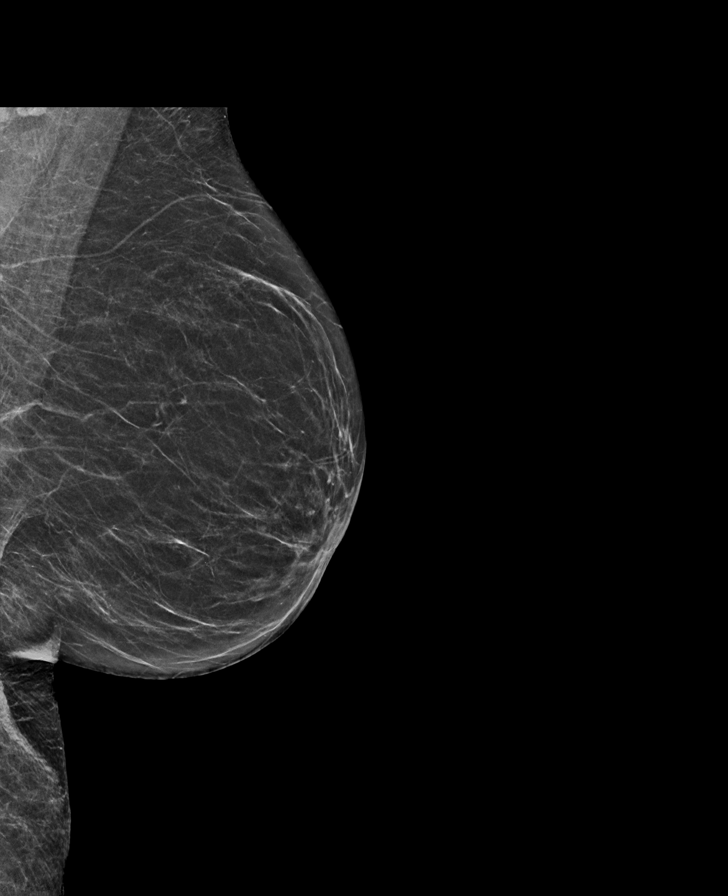

[L MLO tomo · tomo slice 28/55.0]
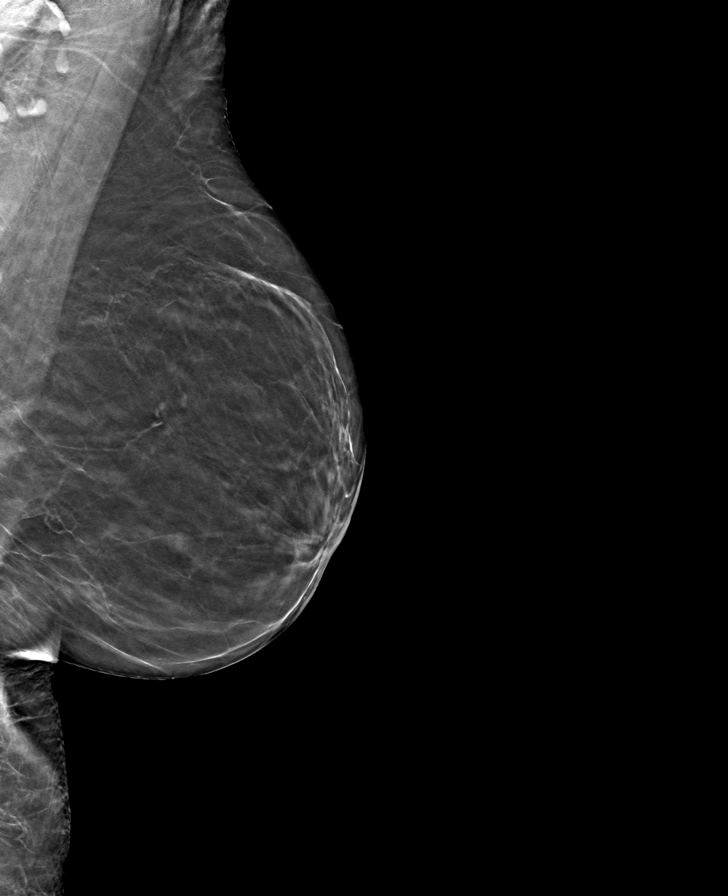

[R CC tomo · tomo slice 27/53.0]
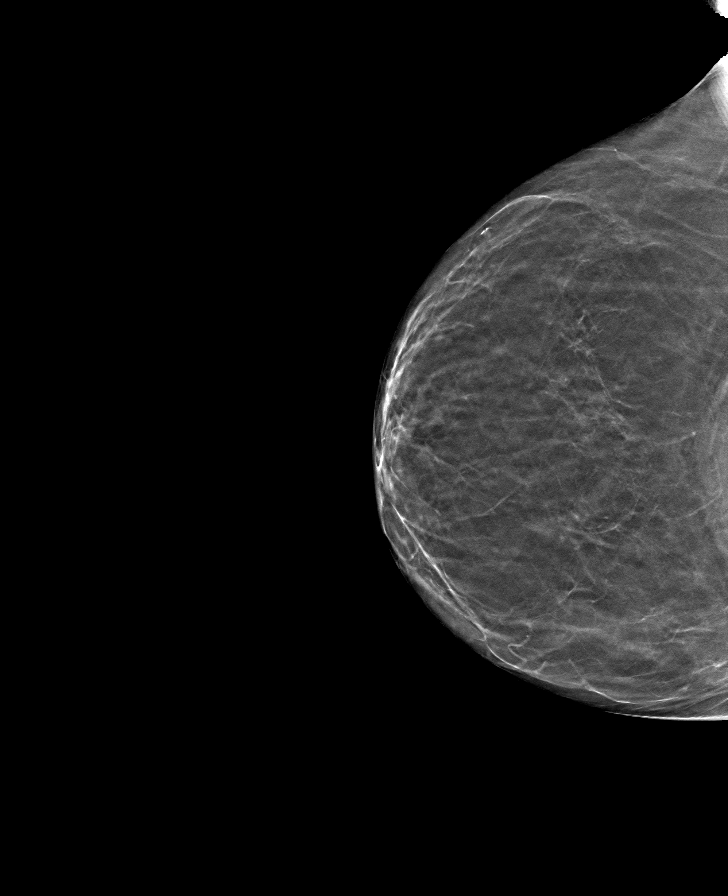

[R MLO tomo · tomo slice 29/58.0]
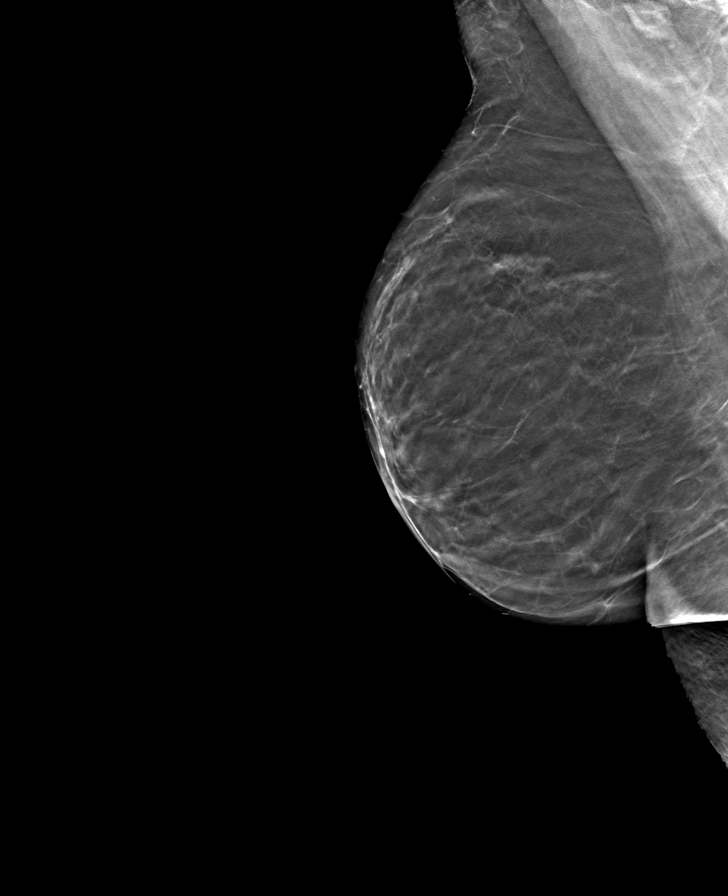

[L CC tomo · tomo slice 26/51.0]
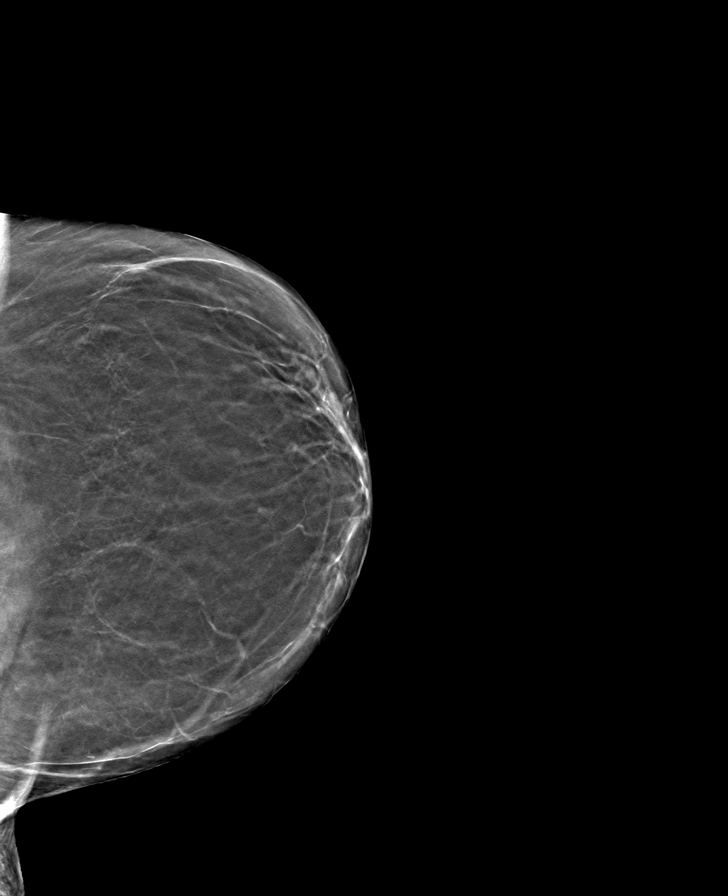

[8 of 24 positions shown; findings below may reference images not displayed]

ACR Breast Density Category b: There are scattered areas of
fibroglandular density.
FINDINGS: There are no findings suspicious for malignancy.
IMPRESSION: No mammographic evidence of malignancy. A result letter of this
screening mammogram will be mailed directly to the patient.

RECOMMENDATION:
Screening mammogram in one year. (Code:51-O-LD2)

BI-RADS CATEGORY  1: Negative.

## 2023-01-26 ENCOUNTER — Ambulatory Visit (INDEPENDENT_AMBULATORY_CARE_PROVIDER_SITE_OTHER): Payer: Medicare Other | Admitting: Psychology

## 2023-01-26 DIAGNOSIS — F3289 Other specified depressive episodes: Secondary | ICD-10-CM
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
## 2023-02-08 ENCOUNTER — Ambulatory Visit (INDEPENDENT_AMBULATORY_CARE_PROVIDER_SITE_OTHER): Payer: Medicare Other | Admitting: Psychology

## 2023-02-08 DIAGNOSIS — F3289 Other specified depressive episodes: Secondary | ICD-10-CM

## 2023-02-08 NOTE — Progress Notes (Signed)
[facility] Counselor/Therapist Progress Note  Patient ID: Tammy Kennedy, MRN: [id number]    Date: 02/08/23  Time Spent: 9:02  am - 9:53 am : 51 Minutes  Treatment Type: Individual Therapy.  Reported Symptoms: depression and anxiety.   Mental Status Exam: Appearance:  Casual     Behavior: Appropriate  Motor: Normal  Speech/Language:  Clear and Coherent  Affect: Flat  Mood: anxious  Thought process: normal  Thought content:   WNL  Sensory/Perceptual disturbances:   WNL  Orientation: oriented to person, place, time/date, and situation  Attention: Good  Concentration: Good  Memory: WNL  Fund of knowledge:  Good  Insight:   Good  Judgment:  Good  Impulse Control: Good   Risk Assessment: Danger to Self:  No Self-injurious Behavior: No Danger to Others: No Duty to Warn:no Physical Aggression / Violence:No  Access to Firearms a concern: No  Gang Involvement:No   Subjective:   Tammy Kennedy participated in the session, in person in the office with the therapist, and consented to treatment Tammy Kennedy reviewed the events of the past week. We reviewed numerous treatment approaches including CBT, BA, Problem Solving, and Solution focused therapy. Psych-education regarding the Tammy Kennedy's diagnosis of Other depression was provided during the session. We discussed Tammy Kennedy goals treatment goals which include to become more social, reduce procrastination, manage her symptoms, increase motivation to lead to task completion. Additionally, we highlighted reducing rumination, challenging negative thoughts, feelings, and self-talk, creating a routine to support a positive mood, problem-solving, and engagement in enjoyable activities.Tammy Kennedy provided verbal approval of the treatment plan. Therapist provided hangouts for BA and challenging negative thoughts and feelings. Handouts were provided via email for reference and review. We we worked on problem-solving an  issue that she noted having difficulty engaging with the task due to poor motivation and feeling overwhelmed.    Interventions: Psycho-education & Goal Setting.   Diagnosis:   Other depression  Psychiatric Treatment: Yes , [name], MD.   Treatment Plan:  Client Abilities/Strengths Tammy Kennedy is intelligent, self-aware, and motivated for change.   Support System: Friends.   Client Treatment Preferences Outpatient therapy.   Client Statement of Needs Tammy Kennedy would like to become more social, reduce procrastination, manage her symptoms, increase motivation to lead to task completion. Additionally, we highlighted reducing rumination, challenging negative thoughts, feelings, and self-talk, creating a routine to support a positive mood, problem-solving, and engagement in enjoyable activities.   Treatment Level Weekly  Symptoms  Anxiety: feeling nervous, difficulty managing worry, worrying about different things, trouble relaxing, and restlessness.    (Status: maintained) Depression: loss of interest, feeling down, lethargy, fluctuating appetite, feeling bad about self, and difficulty concentrating.     (Status: maintained)  Goals:   Tammy Kennedy experiences symptoms of depression and anxiety.    Target Date: 02/08/24 Frequency: Weekly  Progress: 0 Modality: individual    Therapist will provide referrals for additional resources as appropriate.  Therapist will provide psycho-education regarding Tammy Kennedy's diagnosis and corresponding treatment approaches and interventions. Licensed Clinical Social Worker, [place], LCSW will support the patient's ability to achieve the goals identified. will employ CBT, BA, Problem-solving, Solution Focused, Mindfulness,  coping skills, & other evidenced-based practices will be used to promote progress towards healthy functioning to help manage decrease symptoms associated with her diagnosis.   Reduce overall level, frequency, and intensity of the  feelings of depression and anxiety as evidenced by decreased overall symptoms from 6 to 7 days/week to 0 to  1 days/week per client report for at least 3 consecutive months. Verbally express understanding of the relationship between feelings of depression, anxiety and their impact on thinking patterns and behaviors. Verbalize an understanding of the role that distorted thinking plays in creating fears, excessive worry, and ruminations.  Tammy Kennedy participated in the creation of the treatment plan)    Tammy Ovens, LCSW

## 2023-02-14 ENCOUNTER — Other Ambulatory Visit: Payer: Self-pay | Admitting: Internal Medicine

## 2023-02-19 ENCOUNTER — Ambulatory Visit: Payer: Medicare Other | Admitting: Gastroenterology

## 2023-02-19 ENCOUNTER — Encounter: Payer: Self-pay | Admitting: Gastroenterology

## 2023-02-19 VITALS — BP 100/60 | HR 98 | Ht 59.0 in | Wt 121.0 lb

## 2023-02-19 DIAGNOSIS — K5010 Crohn's disease of large intestine without complications: Secondary | ICD-10-CM | POA: Diagnosis not present

## 2023-02-19 MED ORDER — NA SULFATE-K SULFATE-MG SULF 17.5-3.13-1.6 GM/177ML PO SOLN: ORAL | 0 refills | Status: DC

## 2023-02-19 NOTE — Patient Instructions (Signed)
You have been scheduled for a colonoscopy. Please follow written instructions given to you at your visit today.  Please pick up your prep supplies at the pharmacy within the next 1-3 days. If you use inhalers (even only as needed), please bring them with you on the day of your procedure.   Due to recent changes in healthcare laws, you may see the results of your imaging and laboratory studies on MyChart before your provider has had a chance to review them.  We understand that in some cases there may be results that are confusing or concerning to you. Not all laboratory results come back in the same time frame and the provider may be waiting for multiple results in order to interpret others.  Please give [place] 48 hours in order for your provider to thoroughly review all the results before contacting the office for clarification of your results.    _______________________________________________________  If your blood pressure at your visit was 140/90 or greater, please contact your primary care physician to follow up on this.  _______________________________________________________  If you are age 67 or older, your body mass index should be between 23-30. Your Body mass index is 24.44 kg/m. If this is out of the aforementioned range listed, please consider follow up with your Primary Care Provider.  If you are age 15 or younger, your body mass index should be between 19-25. Your Body mass index is 24.44 kg/m. If this is out of the aformentioned range listed, please consider follow up with your Primary Care Provider.   ________________________________________________________  The [facility] providers would like to encourage you to use [facility] to communicate with providers for non-urgent requests or questions.  Due to long hold times on the telephone, sending your provider a message by [facility] may be a faster and more efficient way to get a response.  Please allow 48 business hours for a response.   Please remember that this is for non-urgent requests.  _______________________________________________________   I appreciate the  opportunity to care for you  Thank You   Marsa Aris , MD

## 2023-02-19 NOTE — Progress Notes (Signed)
Tammy Kennedy    [id number]    Nov 22, 1955  Primary Care Physician:Burns, Bobette Mo, MD  Referring Physician: Pincus Sanes, MD [address] [place],  [place] [address]   Chief complaint: Ulcerative colitis  HPI:  67 year old very pleasant female with history of Crohn's disease involving ileum and colon, history of fistulizing disease with rectovaginal fistula here for follow-up visit   She was overall doing well on daily mesalamine.  She was tapered off Imuran and was on budesonide during transition We had to switch therapy to Balsalazide due to insurance change in coverage, she is having increased bowel frequency but denies any mucus or blood in stool She is tolerating balsalazide well, is only able to take 4 tablets daily.  On average she has 2-3 soft bowel movements per day Denies any nausea, vomiting, abdominal pain, melena or bright red blood per rectum Overall doing well from GI standpoint   Lower back compression fracture with osteoarthritic changes L3-L5, high vitamin D level.   Vitamin D level was elevated at 73 in June, has since dropped down to below 30.   She is taking daily vitamin D   Colonoscopy April 23, 2020: Mild inactive colitis, quiescent.  Right and left colon biopsies showed benign mucosa with no active inflammation.  Diverticulosis   Outpatient Encounter Medications as of 02/19/2023  Medication Sig   ARIPiprazole (ABILIFY) 5 MG tablet TAKE 1 TABLET(5 MG) BY MOUTH DAILY   b complex vitamins capsule Take 1 capsule by mouth daily.   balsalazide (COLAZAL) 750 MG capsule TAKE 2 CAPSULES(1500 MG) BY MOUTH THREE TIMES DAILY   Calcium Carbonate-Vitamin D 600-5 MG-MCG TABS Take 1 tablet by mouth daily.   cholecalciferol (VITAMIN D3) 25 MCG (1000 UNIT) tablet Take 1,000 Units by mouth daily.   folic acid (FOLVITE) 1 MG tablet Take 2 mg by mouth daily.   levothyroxine (SYNTHROID) 75 MCG tablet Take 1 tablet (75 mcg total) by mouth daily. Take 6 days  a week only   Multiple Vitamins-Minerals (CENTRUM SILVER 50+WOMEN) TABS Take by mouth.   Omega 3 1200 MG CAPS Take 1 capsule (1,200 mg total) by mouth daily.   pantoprazole (PROTONIX) 40 MG tablet Take twice daily for 1-2 weeks and then decrease to once daily.  Take 30 minutes prior to a meal   Potassium Gluconate 550 MG TABS Take 2 tablets by mouth daily.    rosuvastatin (CRESTOR) 5 MG tablet TAKE 1 TABLET(5 MG) BY MOUTH DAILY   Sennosides (EX-LAX PO) Take by mouth as directed.   valACYclovir (VALTREX) 500 MG tablet TAKE 1 TABLET(500 MG) BY MOUTH DAILY   venlafaxine XR (EFFEXOR-XR) 150 MG 24 hr capsule TAKE 1 CAPSULE BY MOUTH EVERY MORNING WITH BREAKFAST   venlafaxine XR (EFFEXOR-XR) 75 MG 24 hr capsule TAKE 1 CAPSULE BY MOUTH DAILY WITH BREAKFAST IN ADDITION TO 150 MG DAILY   [DISCONTINUED] magnesium gluconate (MAGONATE) 500 MG tablet Take 500 mg by mouth daily.   [DISCONTINUED] vitamin B-12 (CYANOCOBALAMIN) 500 MCG tablet Take 500 mcg by mouth daily.   No facility-administered encounter medications on file as of 02/19/2023.    Allergies as of 02/19/2023   (No Known Allergies)    Past Medical History:  Diagnosis Date   Alcohol abuse 05/14/2016   ALCOHOL ABUSE, HX OF    ANEMIA    ANXIETY DISORDER    Basal cell carcinoma (BCC) 01/10/2018   C. difficile colitis    CROHN'S  DISEASE    DEPRESSION    DRY EYE SYNDROME    HEPATOMEGALY, HX OF    HYPERLIPIDEMIA    intol of statin trials   HYPOTHYROIDISM    OSTEOPENIA    Osteoporosis 05/2017   states Pre osteoporosis 8/18   Rectovaginal fistula 2012   chron's disease   Tubular adenoma 2013    Past Surgical History:  Procedure Laterality Date   ANAL FISSURE REPAIR     CATARACT EXTRACTION     EYE SURGERY  11/2017   macular pucker   HEMORRHOID SURGERY     IR RADIOLOGIST EVAL & MGMT  08/05/2021   Left thumb surgery  1989   MOHS SURGERY  2008   Nose BCC   Theraputic abortion     TOTAL HIP ARTHROPLASTY Right    09/2021    Family  History  Adopted: Yes  Problem Relation Age of Onset   Breast cancer Neg Hx     Social History   Socioeconomic History   Marital status: Divorced    Spouse name: Not on file   Number of children: Not on file   Years of education: Not on file   Highest education level: Not on file  Occupational History   Not on file  Tobacco Use   Smoking status: Never   Smokeless tobacco: Never  Vaping Use   Vaping Use: Never used  Substance and Sexual Activity   Alcohol use: Yes    Comment: has drank excessively, not drinking daily   Drug use: No   Sexual activity: Not on file  Other Topics Concern   Not on file  Social History Narrative   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   Social Determinants of Corporate investment banker Strain: Low Risk  (06/01/2022)   Overall Financial Resource Strain (CARDIA)    Difficulty of Paying Living Expenses: Not hard at all  Food Insecurity: No Food Insecurity (06/01/2022)   Hunger Vital Sign    Worried About Running Out of Food in the Last Year: Never true    Ran Out of Food in the Last Year: Never true  Transportation Needs: No Transportation Needs (06/01/2022)   PRAPARE - Administrator, Civil Service (Medical): No    Lack of Transportation (Non-Medical): No  Physical Activity: Inactive (06/01/2022)   Exercise Vital Sign    Days of Exercise per Week: 0 days    Minutes of Exercise per Session: 0 min  Stress: No Stress Concern Present (06/01/2022)   [employer] of Occupational Health - Occupational Stress Questionnaire    Feeling of Stress : Not at all  Social Connections: Moderately Integrated (06/01/2022)   Social Connection and Isolation Panel [NHANES]    Frequency of Communication with Friends and Family: More than three times a week    Frequency of Social Gatherings with Friends and Family: More than three times a week    Attends Religious Services: 1 to 4 times per year    Active Member of [employer] or  Organizations: Yes    Attends Banker Meetings: 1 to 4 times per year    Marital Status: Divorced  Intimate Partner Violence: Not At Risk (06/01/2022)   Humiliation, Afraid, Rape, and Kick questionnaire    Fear of Current or Ex-Partner: No    Emotionally Abused: No    Physically Abused: No    Sexually Abused: No      Review of systems: All other review of systems  negative except as mentioned in the HPI.   Physical Exam: Vitals:   02/19/23 1037  BP: 100/60  Pulse: 98   Body mass index is 24.44 kg/m. Gen:      No acute distress HEENT:  sclera anicteric Abd:      soft, non-tender; no palpable masses, no distension Ext:    No edema Neuro: alert and oriented x 3 Psych: normal mood and affect  Data Reviewed:  Reviewed labs, radiology imaging, old records and pertinent past GI work up   Assessment and Plan/Recommendations:  67 year-old very pleasant female with history of Crohn's disease rectovaginal fistula currently in remission on balsalazide   Crohn's disease: Inactive colitis based on recent colonoscopy Due for surveillance colonoscopy, will schedule it The risks and benefits as well as alternatives of endoscopic procedure(s) have been discussed and reviewed. All questions answered. The patient agrees to proceed.   She is only able to take 4 capsules, total 3 g of balsalazide daily Continue current dose  Vitamin D deficiency: Continue daily vitamin D She is up-to-date with IBD health maintenance  Return in 1 year or sooner if needed  The patient was provided an opportunity to ask questions and all were answered. The patient agreed with the plan and demonstrated an understanding of the instructions.  Iona Beard , MD    CC: Pincus Sanes, MD

## 2023-02-21 ENCOUNTER — Other Ambulatory Visit: Payer: Self-pay | Admitting: Internal Medicine

## 2023-02-22 ENCOUNTER — Ambulatory Visit (INDEPENDENT_AMBULATORY_CARE_PROVIDER_SITE_OTHER): Payer: Medicare Other | Admitting: Psychology

## 2023-02-22 DIAGNOSIS — F3289 Other specified depressive episodes: Secondary | ICD-10-CM | POA: Diagnosis not present

## 2023-02-22 NOTE — Progress Notes (Signed)
[facility] Counselor/Therapist Progress Note  Patient ID: Tammy Kennedy, MRN: [id number]    Date: 02/22/23  Time Spent: 11:05  am - 11:58 am : 53 Minutes  Treatment Type: Individual Therapy.  Reported Symptoms: depression and anxiety.   Mental Status Exam: Appearance:  Casual     Behavior: Appropriate  Motor: Normal  Speech/Language:  Clear and Coherent  Affect: Flat  Mood: anxious  Thought process: normal  Thought content:   WNL  Sensory/Perceptual disturbances:   WNL  Orientation: oriented to person, place, time/date, and situation  Attention: Good  Concentration: Good  Memory: WNL  Fund of knowledge:  Good  Insight:   Good  Judgment:  Good  Impulse Control: Good   Risk Assessment: Danger to Self:  No Self-injurious Behavior: No Danger to Others: No Duty to Warn:no Physical Aggression / Violence:No  Access to Firearms a concern: No  Gang Involvement:No   Subjective:   Tammy Kennedy participated in the session, in person in the office with the therapist, and consented to treatment Tammy Kennedy reviewed the events of the past week. She noted receiving support from a friend who has been motivating her to take on smaller tasks. She noted this being helpful and noted feeling more accomplished. She noted having external accountability being helpful. She has not completed the activity log but noted printing it out. We discussed the purpose of this in relation to identifying engagement and mood while engaging. We discussed the importance this in relation to mood. Therapist discussed creating short lists for activities/tasks to complete daily. Tammy Kennedy noted concrete plans becoming stressful, pressure, possible vertigo, lethargy. She noted feeling tense, scarred, worried, afraid. She noted feeling afraid she won't finish or doing things "on time". She noted feeling bad about self if task isn't done. She noted her perspective changes after retirement. We discussed the  importance of re-framing, challenging negative thoughts and assumptions, identifying tasks to increase enjoyment (reading/music). Therapist provided psycho-education regarding the benefits of for mood management. Therapist encouraged Tammy Kennedy to work on being more mindful to reframe, identify feelings, and asking for assistance with topics and areas where she is uninformed. Tammy Kennedy was engaged and omtivated during the session. Therapist prasied Tammy Kennedy for her effort and energy.   Interventions: CBT   Diagnosis:   Other depression  Psychiatric Treatment: Yes , [name], MD.   Treatment Plan:  Client Abilities/Strengths Tammy Kennedy is intelligent, self-aware, and motivated for change.   Support System: Friends.   Client Treatment Preferences Outpatient therapy.   Client Statement of Needs [name] would like to become more social, reduce procrastination, manage her symptoms, increase motivation to lead to task completion. Additionally, we highlighted reducing rumination, challenging negative thoughts, feelings, and self-talk, creating a routine to support a positive mood, problem-solving, and engagement in enjoyable activities.   Treatment Level Weekly  Symptoms  Anxiety: feeling nervous, difficulty managing worry, worrying about different things, trouble relaxing, and restlessness.    (Status: maintained) Depression: loss of interest, feeling down, lethargy, fluctuating appetite, feeling bad about self, and difficulty concentrating.     (Status: maintained)  Goals:   Tammy Kennedy experiences symptoms of depression and anxiety.    Target Date: 02/08/24 Frequency: Weekly  Progress: 0 Modality: individual    Therapist will provide referrals for additional resources as appropriate.  Therapist will provide psycho-education regarding [name]'s diagnosis and corresponding treatment approaches and interventions. Licensed Clinical Social Worker, [place], LCSW will support the patient's ability to  achieve the goals identified. will employ CBT,  BA, Problem-solving, Solution Focused, Mindfulness,  coping skills, & other evidenced-based practices will be used to promote progress towards healthy functioning to help manage decrease symptoms associated with her diagnosis.   Reduce overall level, frequency, and intensity of the feelings of depression and anxiety as evidenced by decreased overall symptoms from 6 to 7 days/week to 0 to 1 days/week per client report for at least 3 consecutive months. Verbally express understanding of the relationship between feelings of depression, anxiety and their impact on thinking patterns and behaviors. Verbalize an understanding of the role that distorted thinking plays in creating fears, excessive worry, and ruminations.  Tammy Kennedy participated in the creation of the treatment plan)    [name], LCSW

## 2023-02-24 ENCOUNTER — Encounter: Payer: Self-pay | Admitting: Internal Medicine

## 2023-03-09 ENCOUNTER — Ambulatory Visit: Payer: Medicare Other | Admitting: Psychology

## 2023-03-15 ENCOUNTER — Ambulatory Visit (INDEPENDENT_AMBULATORY_CARE_PROVIDER_SITE_OTHER): Payer: Medicare Other | Admitting: Psychology

## 2023-03-15 DIAGNOSIS — F3289 Other specified depressive episodes: Secondary | ICD-10-CM

## 2023-03-15 NOTE — Progress Notes (Signed)
[facility] Counselor/Therapist Progress Note  Patient ID: Tammy Kennedy, MRN: [id number]    Date: 03/15/23  Time Spent: 10:04 am - 10:52 am : 48 Minutes  Treatment Type: Individual Therapy.  Reported Symptoms: depression and anxiety.   Mental Status Exam: Appearance:  Casual     Behavior: Appropriate  Motor: Normal  Speech/Language:  Clear and Coherent  Affect: Flat  Mood: anxious  Thought process: normal  Thought content:   WNL  Sensory/Perceptual disturbances:   WNL  Orientation: oriented to person, place, time/date, and situation  Attention: Good  Concentration: Good  Memory: WNL  Fund of knowledge:  Good  Insight:   Good  Judgment:  Good  Impulse Control: Good   Risk Assessment: Danger to Self:  No Self-injurious Behavior: No Danger to Others: No Duty to Warn:no Physical Aggression / Violence:No  Access to Firearms a concern: No  Gang Involvement:No   Subjective:   Tammy Kennedy participated in the session, in person in the office with the therapist, and consented to treatment Tammy Kennedy reviewed the events of the past week. Tammy Kennedy noted visiting the beach and noted this being enjoyable, overall. She noted continued grief, as her roommate Molly Maduro passed April 1st, 2023,  and noted this improving but still present. She worked on describing her grief including feeling "frozen in place" and sadness. She noted the improvement being due to less grief and better functioning. She noted that time passing helped in this regard. She noted continued expectations that her roommate would come down the stairs. Barriers include reminders such as photos and belongings, such as  photos, computers, furniture. She noted missing various activities with her roommate and noted having a standing Friday evening meal with him and her best friend, who also was deceased. She noted sadness regarding the lack of her routine for Friday dinners. She noted a lack of consistency being  difficult. She noted enjoying the predictability. Therapist validated Tammy Kennedy's feelings and experience and normalized her grief. Therapist provided psycho-education regarding the stages of grief during the session. We discussed ways to engage in activities that could improve our mood and create structure in her weekly routine. Therapist provided a handout for an activity log and a values worksheet to be used to identify her current routine and ways to integrate her values into a potential routine. Tammy Kennedy was engaged and omtivated during the session. Therapist prasied Tammy Kennedy for her effort and energy.   Interventions: CBT & grief  Diagnosis:   Other depression  Psychiatric Treatment: Yes , [name], MD.   Treatment Plan:  Client Abilities/Strengths Tammy Kennedy is intelligent, self-aware, and motivated for change.   Support System: Friends.   Client Treatment Preferences Outpatient therapy.   Client Statement of Needs Tammy Kennedy would like to become more social, reduce procrastination, manage her symptoms, increase motivation to lead to task completion. Additionally, we highlighted reducing rumination, challenging negative thoughts, feelings, and self-talk, creating a routine to support a positive mood, problem-solving, and engagement in enjoyable activities.   Treatment Level Weekly  Symptoms  Anxiety: feeling nervous, difficulty managing worry, worrying about different things, trouble relaxing, and restlessness.    (Status: maintained) Depression: loss of interest, feeling down, lethargy, fluctuating appetite, feeling bad about self, and difficulty concentrating.     (Status: maintained)  Goals:   Tammy Kennedy experiences symptoms of depression and anxiety.    Target Date: 02/08/24 Frequency: Weekly  Progress: 0 Modality: individual    Therapist will provide referrals for additional resources as appropriate.  Therapist will provide psycho-education regarding [name]'s diagnosis and corresponding  treatment approaches and interventions. Licensed Clinical Social Worker, [place], LCSW will support the patient's ability to achieve the goals identified. will employ CBT, BA, Problem-solving, Solution Focused, Mindfulness,  coping skills, & other evidenced-based practices will be used to promote progress towards healthy functioning to help manage decrease symptoms associated with her diagnosis.   Reduce overall level, frequency, and intensity of the feelings of depression and anxiety as evidenced by decreased overall symptoms from 6 to 7 days/week to 0 to 1 days/week per client report for at least 3 consecutive months. Verbally express understanding of the relationship between feelings of depression, anxiety and their impact on thinking patterns and behaviors. Verbalize an understanding of the role that distorted thinking plays in creating fears, excessive worry, and ruminations.  Tammy Kennedy participated in the creation of the treatment plan)    Tammy Ovens, LCSW

## 2023-03-22 ENCOUNTER — Other Ambulatory Visit: Payer: Self-pay | Admitting: Internal Medicine

## 2023-03-22 ENCOUNTER — Ambulatory Visit: Payer: Medicare Other | Admitting: Psychology

## 2023-03-29 ENCOUNTER — Ambulatory Visit (INDEPENDENT_AMBULATORY_CARE_PROVIDER_SITE_OTHER): Payer: Medicare Other | Admitting: Psychology

## 2023-03-29 DIAGNOSIS — F3289 Other specified depressive episodes: Secondary | ICD-10-CM

## 2023-03-29 NOTE — Progress Notes (Signed)
[facility] Counselor/Therapist Progress Note  Patient ID: ANALUISA BURKER, MRN: [id number]    Date: 03/29/23  Time Spent: 9:55 am - 10:52 am : 57 Minutes  Treatment Type: Individual Therapy.  Reported Symptoms: depression and anxiety.   Mental Status Exam: Appearance:  Casual     Behavior: Appropriate  Motor: Normal  Speech/Language:  Clear and Coherent  Affect: Flat  Mood: anxious  Thought process: normal  Thought content:   WNL  Sensory/Perceptual disturbances:   WNL  Orientation: oriented to person, place, time/date, and situation  Attention: Good  Concentration: Good  Memory: WNL  Fund of knowledge:  Good  Insight:   Good  Judgment:  Good  Impulse Control: Good   Risk Assessment: Danger to Self:  No Self-injurious Behavior: No Danger to Others: No Duty to Warn:no Physical Aggression / Violence:No  Access to Firearms a concern: No  Gang Involvement:No   Subjective:   [name] participated in the session, in person in the office with the therapist, and consented to treatment Amariya reviewed the events of the past week. [name] noted beginning to order groceries to the home and noted this aiding in managing her distress of leaving the home, lifting heavy groceries, and dealing with the task of shopping itself. We worked processing this and Deenah acknowledged some avoidance. Therapist provided psycho-education regarding avoidance during the session and provided examples. We explored her avoidance of leaving the home. She noted more distressed during winter than warmer months. She noted talking about it helped with experiencing less distress. She noted barriers including Task specifics and physical strain (lifting cat litter), Leaving the comfort of home, the build-up (getting dressed, driving, etc), and Lack of companionship on trips to help Molly Maduro). Self-talk includes I don't want to go out, boring, I'd rather stay home, I have to pick up too many  things, I wish my list was shorter, unenjoyable task. We worked on procesisng her self-talk and the effects of self-talk on mood and outlook. She often watches TV, works on needle point, household chores (bill paying). She noted often feeling relief when avoiding the task and noted  leaving the house means "I am going to be uncomfortable". Therapist provided psycho-education regarding anxiety and provided handouts regarding anxiety and challenging negative thoughts and feelings. We discussed the importance of being mindful of self-talk and challenging it. Therapist modeled this during the session. We will work on managing rumination going forward.  [name] was engaged and omtivated during the session and expressed her commitment towards our goals. Therapist prasied [name] for her effort and energy and provided supportive therapy.   Interventions: CBT   Diagnosis:   Other depression  Psychiatric Treatment: Yes , [name], MD.   Treatment Plan:  Client Abilities/Strengths Nyomii is intelligent, self-aware, and motivated for change.   Support System: Friends.   Client Treatment Preferences Outpatient therapy.   Client Statement of Needs Maylynn would like to become more social, reduce procrastination, manage her symptoms, increase motivation to lead to task completion. Additionally, we highlighted reducing rumination, challenging negative thoughts, feelings, and self-talk, creating a routine to support a positive mood, problem-solving, and engagement in enjoyable activities.   Treatment Level Weekly  Symptoms  Anxiety: feeling nervous, difficulty managing worry, worrying about different things, trouble relaxing, and restlessness.    (Status: maintained) Depression: loss of interest, feeling down, lethargy, fluctuating appetite, feeling bad about self, and difficulty concentrating.     (Status: maintained)  Goals:   [name] experiences symptoms  of depression and anxiety.    Target Date:  02/08/24 Frequency: Weekly  Progress: 0 Modality: individual    Therapist will provide referrals for additional resources as appropriate.  Therapist will provide psycho-education regarding Alexiana's diagnosis and corresponding treatment approaches and interventions. Licensed Clinical Social Worker, [place], LCSW will support the patient's ability to achieve the goals identified. will employ CBT, BA, Problem-solving, Solution Focused, Mindfulness,  coping skills, & other evidenced-based practices will be used to promote progress towards healthy functioning to help manage decrease symptoms associated with her diagnosis.   Reduce overall level, frequency, and intensity of the feelings of depression and anxiety as evidenced by decreased overall symptoms from 6 to 7 days/week to 0 to 1 days/week per client report for at least 3 consecutive months. Verbally express understanding of the relationship between feelings of depression, anxiety and their impact on thinking patterns and behaviors. Verbalize an understanding of the role that distorted thinking plays in creating fears, excessive worry, and ruminations.  [name] participated in the creation of the treatment plan)    [name], LCSW

## 2023-04-02 ENCOUNTER — Other Ambulatory Visit: Payer: Self-pay | Admitting: Internal Medicine

## 2023-04-12 ENCOUNTER — Ambulatory Visit (INDEPENDENT_AMBULATORY_CARE_PROVIDER_SITE_OTHER): Payer: Medicare Other | Admitting: Psychology

## 2023-04-12 DIAGNOSIS — F3289 Other specified depressive episodes: Secondary | ICD-10-CM | POA: Diagnosis not present

## 2023-04-12 NOTE — Progress Notes (Signed)
[facility] Counselor/Therapist Progress Note  Patient ID: AKIYA KADRMAS, MRN: [id number]    Date: 04/12/23  Time Spent: 12:32 pm - 1:23 pm : 51 Minutes  Treatment Type: Individual Therapy.  Reported Symptoms: depression and anxiety.   Mental Status Exam: Appearance:  Casual     Behavior: Appropriate  Motor: Normal  Speech/Language:  Clear and Coherent  Affect: Flat  Mood: anxious  Thought process: normal  Thought content:   WNL  Sensory/Perceptual disturbances:   WNL  Orientation: oriented to person, place, time/date, and situation  Attention: Good  Concentration: Good  Memory: WNL  Fund of knowledge:  Good  Insight:   Good  Judgment:  Good  Impulse Control: Good   Risk Assessment: Danger to Self:  No Self-injurious Behavior: No Danger to Others: No Duty to Warn:no Physical Aggression / Violence:No  Access to Firearms a concern: No  Gang Involvement:No   Subjective:   Tammy Kennedy participated in the session, in person in the office with the therapist, and consented to treatment Tammy Kennedy reviewed the events of the past week. She noted being more social, both at home and out-of. She noted enjoying both experiences. She noted working on leaving the home to go grocery store and noted working managing her stress leaving the home. She experiencing anxiety 5/10 and noted going due to having friend coming for a meal, noting external pressure. We discussed her negative self-talk regarding this task and similar tasks. We explored this during the session and ways to begin challenging this via self-talk, being mindful of this and challenging this, identifying positives in the experience, before and after the task. She noted feeling emotionally and physically tense, feeling shaky, mild nausea. She noted that she often things "something is coming up, something we don't like that you should be cautious or worried about". She highlighted this being the "unknown" and  noted the feeling that it would be a "arduous tasks, going to take a long time, that I am not going to be happy about it, that I really don't want to be there". Tammy Kennedy noted that she does not have any tools to use that can calm her system down.  Therapist discussed the importance of managing physiological symptoms when managing anxiety overall.  Psychoeducation was provided regarding anxiety and symptom management.  Therapist provided 4 - 7 - 8 breathing exercise along with grounding techniques and modeled them during the session.  Tammy Kennedy was emailed a copy of both exercises to practice between sessions.  She was receptive to this and expressed her commitment towards proactive symptom management.  Therapist praised Tammy Kennedy for her effort and energy and encouraged continued practice of these tools.  We scheduled a follow-up for continued treatment as she benefits from sessions.  Therapist validated Tammy Kennedy's feelings and experience and provided supportive therapy.  Interventions: CBT & relaxation  Diagnosis:   Other depression  Psychiatric Treatment: Yes , [name], MD.   Treatment Plan:  Client Abilities/Strengths Tammy Kennedy is intelligent, self-aware, and motivated for change.   Support System: Friends.   Client Treatment Preferences Outpatient therapy.   Client Statement of Needs Tammy Kennedy would like to become more social, reduce procrastination, manage her symptoms, increase motivation to lead to task completion. Additionally, we highlighted reducing rumination, challenging negative thoughts, feelings, and self-talk, creating a routine to support a positive mood, problem-solving, and engagement in enjoyable activities.   Treatment Level Weekly  Symptoms  Anxiety: feeling nervous, difficulty managing worry, worrying about different things, trouble relaxing,  and restlessness.    (Status: maintained) Depression: loss of interest, feeling down, lethargy, fluctuating appetite, feeling bad about self,  and difficulty concentrating.     (Status: maintained)  Goals:   Shaiya experiences symptoms of depression and anxiety.    Target Date: 02/08/24 Frequency: Weekly  Progress: 0 Modality: individual    Therapist will provide referrals for additional resources as appropriate.  Therapist will provide psycho-education regarding Tammy Kennedy's diagnosis and corresponding treatment approaches and interventions. Licensed Clinical Social Worker, [place], LCSW will support the patient's ability to achieve the goals identified. will employ CBT, BA, Problem-solving, Solution Focused, Mindfulness,  coping skills, & other evidenced-based practices will be used to promote progress towards healthy functioning to help manage decrease symptoms associated with her diagnosis.   Reduce overall level, frequency, and intensity of the feelings of depression and anxiety as evidenced by decreased overall symptoms from 6 to 7 days/week to 0 to 1 days/week per client report for at least 3 consecutive months. Verbally express understanding of the relationship between feelings of depression, anxiety and their impact on thinking patterns and behaviors. Verbalize an understanding of the role that distorted thinking plays in creating fears, excessive worry, and ruminations.  Tammy Kennedy participated in the creation of the treatment plan)    [name], LCSW

## 2023-04-27 ENCOUNTER — Ambulatory Visit: Payer: Medicare Other | Admitting: Psychology

## 2023-04-27 DIAGNOSIS — F3289 Other specified depressive episodes: Secondary | ICD-10-CM

## 2023-04-27 NOTE — Progress Notes (Addendum)
[facility] Counselor/Therapist Progress Note  Patient ID: Tammy Kennedy, MRN: [id number]    Date: 04/27/23  Time Spent: 10:04 am - 10:50 am : 46 Minutes  Treatment Type: Individual Therapy.  Reported Symptoms: depression and anxiety.   Mental Status Exam: Appearance:  Casual     Behavior: Appropriate  Motor: Normal  Speech/Language:  Clear and Coherent  Affect: Flat  Mood: anxious  Thought process: normal  Thought content:   WNL  Sensory/Perceptual disturbances:   WNL  Orientation: oriented to person, place, time/date, and situation  Attention: Good  Concentration: Good  Memory: WNL  Fund of knowledge:  Good  Insight:   Good  Judgment:  Good  Impulse Control: Good   Risk Assessment: Danger to Self:  No Self-injurious Behavior: No Danger to Others: No Duty to Warn:no Physical Aggression / Violence:No  Access to Firearms a concern: No  Gang Involvement:No   Subjective:   Tammy Kennedy participated in the session, from home, is aware of the limitations of tele-sessions, and consented to treatment. Therapist participated from home-office. The session was conducted via video. Tevin reviewed the events of the past week. Tammy Kennedy noted continued efforts to be social and noted leaving the home, for a social outing, around 4 times in the past two weeks. She noted often being anxious when she gets ready but that this largely dissipates prior to leaving. We worked on identifying her self-talk regarding shopping and leaving the home. She noted "feeling anxious, wondering if she has to go alone or with others, am I going to a store I like, do I have to carry heavy things", "I wont enjoy myself", "this is a task rather than a fun thing". We worked on processing this during the session. She noted what "keeps" her home including comfort, being able to do enjoyable things at home, "freedom of time", and "not being anxious". Therapist provided psycho-education regarding  anxiety and the importance of managing perspective, somatic symptoms, and how somatic symptoms affect mood and outlook. Tammy Kennedy noted that her changes in breathing being noticeable when leaving the home. Therapist encouraged Tammy Kennedy to use a breathing log and breathing exercises, provided via email, to log her breathing and work on building mindfulness of. Therapist modeled this during the session. She has note worked on Pharmacist, hospital and relaxation yet due to computer issues which have since been resolved. Therapist encouraged Tammy Kennedy to employ these tools and build a level of comfort with them between sessions. Tammy Kennedy was engaged and motivated during the session and expressed commitment towards goals. We scheduled a follow-up for continued treatment. Therapist provided supportive therapy.    Interventions: CBT & relaxation  Diagnosis:   Other depression  Psychiatric Treatment: Yes , [name], MD.   Treatment Plan:  Client Abilities/Strengths [name] is intelligent, self-aware, and motivated for change.   Support System: Friends.   Client Treatment Preferences Outpatient therapy.   Client Statement of Needs Tammy Kennedy would like to become more social, reduce procrastination, manage her symptoms, increase motivation to lead to task completion. Additionally, we highlighted reducing rumination, challenging negative thoughts, feelings, and self-talk, creating a routine to support a positive mood, problem-solving, and engagement in enjoyable activities.   Treatment Level Weekly  Symptoms  Anxiety: feeling nervous, difficulty managing worry, worrying about different things, trouble relaxing, and restlessness.    (Status: maintained) Depression: loss of interest, feeling down, lethargy, fluctuating appetite, feeling bad about self, and difficulty concentrating.     (Status: maintained)  Goals:  Tammy Kennedy experiences symptoms of depression and anxiety.    Target Date: 02/08/24 Frequency: Weekly   Progress: 0 Modality: individual    Therapist will provide referrals for additional resources as appropriate.  Therapist will provide psycho-education regarding Tammy Kennedy's diagnosis and corresponding treatment approaches and interventions. Licensed Clinical Social Worker, [place], LCSW will support the patient's ability to achieve the goals identified. will employ CBT, BA, Problem-solving, Solution Focused, Mindfulness,  coping skills, & other evidenced-based practices will be used to promote progress towards healthy functioning to help manage decrease symptoms associated with her diagnosis.   Reduce overall level, frequency, and intensity of the feelings of depression and anxiety as evidenced by decreased overall symptoms from 6 to 7 days/week to 0 to 1 days/week per client report for at least 3 consecutive months. Verbally express understanding of the relationship between feelings of depression, anxiety and their impact on thinking patterns and behaviors. Verbalize an understanding of the role that distorted thinking plays in creating fears, excessive worry, and ruminations.  Tammy Kennedy participated in the creation of the treatment plan)    Tammy Ovens, LCSW

## 2023-05-02 ENCOUNTER — Encounter: Payer: Self-pay | Admitting: Certified Registered Nurse Anesthetist

## 2023-05-04 ENCOUNTER — Ambulatory Visit (AMBULATORY_SURGERY_CENTER): Payer: Medicare Other | Admitting: Gastroenterology

## 2023-05-04 ENCOUNTER — Encounter: Payer: Self-pay | Admitting: Gastroenterology

## 2023-05-04 VITALS — BP 92/55 | HR 67 | Temp 97.7°F | Resp 11 | Ht 59.0 in | Wt 121.0 lb

## 2023-05-04 DIAGNOSIS — Z8719 Personal history of other diseases of the digestive system: Secondary | ICD-10-CM

## 2023-05-04 DIAGNOSIS — K529 Noninfective gastroenteritis and colitis, unspecified: Secondary | ICD-10-CM | POA: Diagnosis not present

## 2023-05-04 DIAGNOSIS — K5010 Crohn's disease of large intestine without complications: Secondary | ICD-10-CM

## 2023-05-04 DIAGNOSIS — Z09 Encounter for follow-up examination after completed treatment for conditions other than malignant neoplasm: Secondary | ICD-10-CM

## 2023-05-04 MED ORDER — SODIUM CHLORIDE 0.9 % IV SOLN: 500.0000 mL | Freq: Once | INTRAVENOUS | Status: DC

## 2023-05-04 NOTE — Op Note (Signed)
[facility] Patient Name: Tammy Kennedy Procedure Date: 05/04/2023 10:14 AM MRN: [id number] Endoscopist: Napoleon Form , MD, [id number] Age: 67 Referring MD:  Date of Birth: 01-Oct-1956 Gender: Female Account #: [id number] Procedure:                Colonoscopy Indications:              High risk colon cancer surveillance: Crohn's                            colitis of 8 (or more) years duration with                            one-third (or more) of the colon involved Medicines:                Monitored Anesthesia Care Procedure:                Pre-Anesthesia Assessment:                           - Prior to the procedure, a History and Physical                            was performed, and patient medications and                            allergies were reviewed. The patient's tolerance of                            previous anesthesia was also reviewed. The risks                            and benefits of the procedure and the sedation                            options and risks were discussed with the patient.                            All questions were answered, and informed consent                            was obtained. Prior Anticoagulants: The patient has                            taken no anticoagulant or antiplatelet agents. ASA                            Grade Assessment: II - A patient with mild systemic                            disease. After reviewing the risks and benefits,                            the patient was deemed in satisfactory condition to  undergo the procedure.                           After obtaining informed consent, the colonoscope                            was passed under direct vision. Throughout the                            procedure, the patient's blood pressure, pulse, and                            oxygen saturations were monitored continuously. The                            [id number]-[id number]  Colonoscope [id number] was introduced                            through the anus and advanced to the the cecum,                            identified by appendiceal orifice and ileocecal                            valve. The colonoscopy was performed without                            difficulty. The patient tolerated the procedure                            well. The quality of the bowel preparation was                            good. The ileocecal valve, appendiceal orifice, and                            rectum were photographed. Scope In: 10:26:08 AM Scope Out: 10:46:33 AM Scope Withdrawal Time: 0 hours 11 minutes 33 seconds  Total Procedure Duration: 0 hours 20 minutes 25 seconds  Findings:                 The perianal and digital rectal examinations were                            normal.                           The Simple Endoscopic Score for Crohn's Disease was                            determined based on the endoscopic appearance of                            the mucosa in the following segments: Segment  score: 0.                           - Right Colon: Findings include no ulcers present,                            no ulcerated surfaces, no affected surfaces and no                            narrowings. Segment score: 0.                           - Transverse Colon: Findings include no ulcers                            present, no ulcerated surfaces, no affected                            surfaces and no narrowings. Segment score: 0.                           - Left Colon: Findings include no ulcers present,                            no ulcerated surfaces, no affected surfaces and no                            narrowings. Segment score: 0.                           - Rectum: Findings include no ulcers present, no                            ulcerated surfaces, no affected surfaces and no                            narrowings. Segment score: 0.                            - Total SES-CD aggregate score: 0. Biopsies were                            taken with a cold forceps for histology.                           A few small-mouthed diverticula were found in the                            sigmoid colon, descending colon, transverse colon                            and ascending colon.                           Non-bleeding internal hemorrhoids were found during  retroflexion. The hemorrhoids were medium-sized. Complications:            No immediate complications. Estimated Blood Loss:     Estimated blood loss was minimal. Impression:               - Simple Endoscopic Score for Crohn's Disease: 0,                            mucosal inflammatory changes secondary to Crohn's                            disease, in remission. Biopsied.                           - Diverticulosis in the sigmoid colon, in the                            descending colon, in the transverse colon and in                            the ascending colon.                           - Non-bleeding internal hemorrhoids. Recommendation:           - Patient has a contact number available for                            emergencies. The signs and symptoms of potential                            delayed complications were discussed with the                            patient. Return to normal activities tomorrow.                            Written discharge instructions were provided to the                            patient.                           - Resume previous diet.                           - Continue present medications.                           - Await pathology results.                           - Repeat colonoscopy in 3 years for surveillance                            based on pathology results. Napoleon Form, MD 05/04/2023 11:00:34 AM This report has been signed electronically.

## 2023-05-04 NOTE — Progress Notes (Signed)
Pt's states no medical or surgical changes since previsit or office visit. 

## 2023-05-04 NOTE — Patient Instructions (Addendum)
Handout hemorrhoids and diverticulosis given to patient  Await pathology results from biopsies taken today Resume previous diet and continue present medications Repeat colonoscopy in 3 years for surveillance    YOU HAD AN ENDOSCOPIC PROCEDURE TODAY AT THE [facility]:   Refer to the procedure report that was given to you for any specific questions about what was found during the examination.  If the procedure report does not answer your questions, please call your gastroenterologist to clarify.  If you requested that your care partner not be given the details of your procedure findings, then the procedure report has been included in a sealed envelope for you to review at your convenience later.  YOU SHOULD EXPECT: Some feelings of bloating in the abdomen. Passage of more gas than usual.  Walking can help get rid of the air that was put into your GI tract during the procedure and reduce the bloating. If you had a lower endoscopy (such as a colonoscopy or flexible sigmoidoscopy) you may notice spotting of blood in your stool or on the toilet paper. If you underwent a bowel prep for your procedure, you may not have a normal bowel movement for a few days.  Please Note:  You might notice some irritation and congestion in your nose or some drainage.  This is from the oxygen used during your procedure.  There is no need for concern and it should clear up in a day or so.  SYMPTOMS TO REPORT IMMEDIATELY:  Following lower endoscopy (colonoscopy or flexible sigmoidoscopy):  Excessive amounts of blood in the stool  Significant tenderness or worsening of abdominal pains  Swelling of the abdomen that is new, acute  Fever of 100F or higher   For urgent or emergent issues, a gastroenterologist can be reached at any hour by calling [phone]. Do not use MyChart messaging for urgent concerns.    DIET:  We do recommend a small meal at first, but then you may proceed to your regular diet.   Drink plenty of fluids but you should avoid alcoholic beverages for 24 hours.  ACTIVITY:  You should plan to take it easy for the rest of today and you should NOT DRIVE or use heavy machinery until tomorrow (because of the sedation medicines used during the test).    FOLLOW UP: Our staff will call the number listed on your records the next business day following your procedure.  We will call around 7:15- 8:00 am to check on you and address any questions or concerns that you may have regarding the information given to you following your procedure. If we do not reach you, we will leave a message.     If any biopsies were taken you will be contacted by phone or by letter within the next 1-3 weeks.  Please call us at [phone] if you have not heard about the biopsies in 3 weeks.    SIGNATURES/CONFIDENTIALITY: You and/or your care partner have signed paperwork which will be entered into your electronic medical record.  These signatures attest to the fact that that the information above on your After Visit Summary has been reviewed and is understood.  Full responsibility of the confidentiality of this discharge information lies with you and/or your care-partner.

## 2023-05-04 NOTE — Progress Notes (Signed)
[facility] History and Physical   Primary Care Physician:  Pincus Sanes, MD   Reason for Procedure:  Crohn's colitis  Plan:    colonoscopy with possible interventions as needed     HPI: Tammy Kennedy is a very pleasant 67 y.o. female here for surveillance colonoscopy for h/o crohn's colitis.   The risks and benefits as well as alternatives of endoscopic procedure(s) have been discussed and reviewed. All questions answered. The patient agrees to proceed.    Past Medical History:  Diagnosis Date   Alcohol abuse 05/14/2016   ALCOHOL ABUSE, HX OF    ANEMIA    ANXIETY DISORDER    Basal cell carcinoma (BCC) 01/10/2018   C. difficile colitis    CROHN'S DISEASE    DEPRESSION    DRY EYE SYNDROME    HEPATOMEGALY, HX OF    HYPERLIPIDEMIA    intol of statin trials   HYPOTHYROIDISM    OSTEOPENIA    Osteoporosis 05/2017   states Pre osteoporosis 8/18   Rectovaginal fistula 2012   chron's disease   Tubular adenoma 2013    Past Surgical History:  Procedure Laterality Date   ANAL FISSURE REPAIR     CATARACT EXTRACTION     EYE SURGERY  11/2017   macular pucker   HEMORRHOID SURGERY     IR RADIOLOGIST EVAL & MGMT  08/05/2021   Left thumb surgery  1989   MOHS SURGERY  2008   Nose BCC   Theraputic abortion     TOTAL HIP ARTHROPLASTY Right    09/2021    Prior to Admission medications   Medication Sig Start Date End Date Taking? Authorizing Provider  ARIPiprazole (ABILIFY) 5 MG tablet TAKE 1 TABLET(5 MG) BY MOUTH DAILY 06/23/22  Yes Burns, Bobette Mo, MD  balsalazide (COLAZAL) 750 MG capsule TAKE 2 CAPSULES(1500 MG) BY MOUTH THREE TIMES DAILY 11/04/22  Yes Dorna Mallet, Eleonore Chiquito, MD  cholecalciferol (VITAMIN D3) 25 MCG (1000 UNIT) tablet Take 1,000 Units by mouth daily.   Yes [provider]  folic acid (FOLVITE) 1 MG tablet Take 2 mg by mouth daily.   Yes [provider]  levothyroxine (SYNTHROID) 75 MCG tablet Take 1 tablet (75 mcg total) by mouth  daily. Take 6 days a week only 12/06/22  Yes Burns, Bobette Mo, MD  Multiple Vitamins-Minerals (CENTRUM SILVER 50+WOMEN) TABS Take by mouth.   Yes [provider]  pantoprazole (PROTONIX) 40 MG tablet TAKE 1 TABLET BY MOUTH TWICE DAILY FOR 1 TO 2 WEEKS THEN DECREASE TO ONCE DAILY. TAKE 30 MINUTES PRIOR TO A MEAL 04/02/23  Yes Burns, Bobette Mo, MD  Potassium Gluconate 550 MG TABS Take 2 tablets by mouth daily.    Yes [provider]  rosuvastatin (CRESTOR) 5 MG tablet TAKE 1 TABLET(5 MG) BY MOUTH DAILY 07/21/22  Yes Burns, Bobette Mo, MD  valACYclovir (VALTREX) 500 MG tablet TAKE 1 TABLET(500 MG) BY MOUTH DAILY 02/15/23  Yes Burns, Bobette Mo, MD  venlafaxine XR (EFFEXOR-XR) 150 MG 24 hr capsule TAKE 1 CAPSULE BY MOUTH EVERY MORNING WITH BREAKFAST 03/22/23  Yes Burns, Bobette Mo, MD  venlafaxine XR (EFFEXOR-XR) 75 MG 24 hr capsule TAKE 1 CAPSULE BY MOUTH DAILY WITH BREAKFAST IN ADDITION TO 150 MG DAILY 02/22/23  Yes Burns, Bobette Mo, MD  b complex vitamins capsule Take 1 capsule by mouth daily. 06/10/21   Pincus Sanes, MD  Calcium Carbonate-Vitamin D 600-5 MG-MCG TABS Take 1 tablet by mouth daily.  [provider]  Omega 3 1200 MG CAPS Take 1 capsule (1,200 mg total) by mouth daily. 06/02/22   Pincus Sanes, MD  Sennosides (EX-LAX PO) Take by mouth as directed.    [provider]    Current Outpatient Medications  Medication Sig Dispense Refill   ARIPiprazole (ABILIFY) 5 MG tablet TAKE 1 TABLET(5 MG) BY MOUTH DAILY 30 tablet 5   balsalazide (COLAZAL) 750 MG capsule TAKE 2 CAPSULES(1500 MG) BY MOUTH THREE TIMES DAILY 210 capsule 5   cholecalciferol (VITAMIN D3) 25 MCG (1000 UNIT) tablet Take 1,000 Units by mouth daily.     folic acid (FOLVITE) 1 MG tablet Take 2 mg by mouth daily.     levothyroxine (SYNTHROID) 75 MCG tablet Take 1 tablet (75 mcg total) by mouth daily. Take 6 days a week only 86 tablet 3   Multiple Vitamins-Minerals (CENTRUM SILVER 50+WOMEN) TABS Take by mouth.      pantoprazole (PROTONIX) 40 MG tablet TAKE 1 TABLET BY MOUTH TWICE DAILY FOR 1 TO 2 WEEKS THEN DECREASE TO ONCE DAILY. TAKE 30 MINUTES PRIOR TO A MEAL 60 tablet 3   Potassium Gluconate 550 MG TABS Take 2 tablets by mouth daily.      rosuvastatin (CRESTOR) 5 MG tablet TAKE 1 TABLET(5 MG) BY MOUTH DAILY 90 tablet 3   valACYclovir (VALTREX) 500 MG tablet TAKE 1 TABLET(500 MG) BY MOUTH DAILY 90 tablet 1   venlafaxine XR (EFFEXOR-XR) 150 MG 24 hr capsule TAKE 1 CAPSULE BY MOUTH EVERY MORNING WITH BREAKFAST 90 capsule 1   venlafaxine XR (EFFEXOR-XR) 75 MG 24 hr capsule TAKE 1 CAPSULE BY MOUTH DAILY WITH BREAKFAST IN ADDITION TO 150 MG DAILY 90 capsule 0   b complex vitamins capsule Take 1 capsule by mouth daily.     Calcium Carbonate-Vitamin D 600-5 MG-MCG TABS Take 1 tablet by mouth daily.     Omega 3 1200 MG CAPS Take 1 capsule (1,200 mg total) by mouth daily.     Sennosides (EX-LAX PO) Take by mouth as directed.     Current Facility-Administered Medications  Medication Dose Route Frequency Provider Last Rate Last Admin   0.9 %  sodium chloride infusion  500 mL Intravenous Once Napoleon Form, MD        Allergies as of 05/04/2023   (No Known Allergies)    Family History  Adopted: Yes  Problem Relation Age of Onset   Breast cancer Neg Hx     Social History   Socioeconomic History   Marital status: Divorced    Spouse name: Not on file   Number of children: Not on file   Years of education: Not on file   Highest education level: Not on file  Occupational History   Not on file  Tobacco Use   Smoking status: Never   Smokeless tobacco: Never  Vaping Use   Vaping status: Never Used  Substance and Sexual Activity   Alcohol use: Yes    Comment: has drank excessively, not drinking daily   Drug use: No   Sexual activity: Not on file  Other Topics Concern   Not on file  Social History Narrative   Works at [employer]- part time   Social  Determinants of Health   Financial Resource Strain: Low Risk  (06/01/2022)   Overall Financial Resource Strain (CARDIA)    Difficulty of Paying Living Expenses: Not hard at all  Food Insecurity: No Food Insecurity (06/01/2022)   Hunger Vital Sign  Worried About Programme researcher, broadcasting/film/video in the Last Year: Never true    Ran Out of Food in the Last Year: Never true  Transportation Needs: No Transportation Needs (06/01/2022)   PRAPARE - Administrator, Civil Service (Medical): No    Lack of Transportation (Non-Medical): No  Physical Activity: Inactive (06/01/2022)   Exercise Vital Sign    Days of Exercise per Week: 0 days    Minutes of Exercise per Session: 0 min  Stress: No Stress Concern Present (06/01/2022)   [employer] of Occupational Health - Occupational Stress Questionnaire    Feeling of Stress : Not at all  Social Connections: Moderately Integrated (06/01/2022)   Social Connection and Isolation Panel [NHANES]    Frequency of Communication with Friends and Family: More than three times a week    Frequency of Social Gatherings with Friends and Family: More than three times a week    Attends Religious Services: 1 to 4 times per year    Active Member of [employer] or Organizations: Yes    Attends Banker Meetings: 1 to 4 times per year    Marital Status: Divorced  Catering manager Violence: Not At Risk (06/01/2022)   Humiliation, Afraid, Rape, and Kick questionnaire    Fear of Current or Ex-Partner: No    Emotionally Abused: No    Physically Abused: No    Sexually Abused: No    Review of Systems:  All other review of systems negative except as mentioned in the HPI.  Physical Exam: Vital signs in last 24 hours: BP 117/77   Pulse 76   Temp 97.7 F (36.5 C) (Skin)   Resp 10   Ht 4\' 11"  (1.499 m)   Wt 121 lb (54.9 kg)   LMP 10/12/2005 (LMP Unknown)   SpO2 100%   BMI 24.44 kg/m  General:   Alert, NAD Lungs:  Clear .   Heart:  Regular rate and  rhythm Abdomen:  Soft, nontender and nondistended. Neuro/Psych:  Alert and cooperative. Normal mood and affect. A and O x 3  Reviewed labs, radiology imaging, old records and pertinent past GI work up  Patient is appropriate for planned procedure(s) and anesthesia in an ambulatory setting   K. Scherry Ran , MD [phone]

## 2023-05-04 NOTE — Progress Notes (Unsigned)
Called to room to assist during endoscopic procedure.  Patient ID and intended procedure confirmed with present staff. Received instructions for my participation in the procedure from the performing physician.  

## 2023-05-04 NOTE — Progress Notes (Unsigned)
Report given to PACU, vss 

## 2023-05-05 ENCOUNTER — Telehealth: Payer: Self-pay

## 2023-05-05 DIAGNOSIS — L570 Actinic keratosis: Secondary | ICD-10-CM | POA: Diagnosis not present

## 2023-05-05 DIAGNOSIS — L821 Other seborrheic keratosis: Secondary | ICD-10-CM | POA: Diagnosis not present

## 2023-05-05 DIAGNOSIS — L814 Other melanin hyperpigmentation: Secondary | ICD-10-CM | POA: Diagnosis not present

## 2023-05-05 DIAGNOSIS — D225 Melanocytic nevi of trunk: Secondary | ICD-10-CM | POA: Diagnosis not present

## 2023-05-05 DIAGNOSIS — L578 Other skin changes due to chronic exposure to nonionizing radiation: Secondary | ICD-10-CM | POA: Diagnosis not present

## 2023-05-05 NOTE — Telephone Encounter (Signed)
  Follow up Call-     05/04/2023    9:21 AM  Call back number  Post procedure Call Back phone  # [phone]  Permission to leave phone message Yes     Patient questions:  Do you have a fever, pain , or abdominal swelling? No. Pain Score  0 *  Have you tolerated food without any problems? Yes.    Have you been able to return to your normal activities? Yes.    Do you have any questions about your discharge instructions: Diet   No. Medications  No. Follow up visit  No.  Do you have questions or concerns about your Care? No.  Actions: * If pain score is 4 or above: No action needed, pain <4.

## 2023-05-06 ENCOUNTER — Ambulatory Visit (INDEPENDENT_AMBULATORY_CARE_PROVIDER_SITE_OTHER): Payer: Medicare Other

## 2023-05-06 ENCOUNTER — Other Ambulatory Visit: Payer: Self-pay | Admitting: Internal Medicine

## 2023-05-06 VITALS — Ht 59.0 in | Wt 124.2 lb

## 2023-05-06 DIAGNOSIS — Z0000 Encounter for general adult medical examination without abnormal findings: Secondary | ICD-10-CM

## 2023-05-06 DIAGNOSIS — Z1231 Encounter for screening mammogram for malignant neoplasm of breast: Secondary | ICD-10-CM

## 2023-05-06 NOTE — Progress Notes (Addendum)
Subjective:   Tammy Kennedy is a 67 y.o. female who presents for Medicare Annual (Subsequent) preventive examination.  Visit Complete: In person   Review of Systems    Cardiac Risk Factors include: advanced age (>22men, >71 women)     Objective:    Today's Vitals   05/06/23 1341  Weight: 124 lb 3.2 oz (56.3 kg)  Height: 4\' 11"  (1.499 m)   Body mass index is 25.09 kg/m.     05/06/2023    1:47 PM 06/01/2022   11:21 AM 07/02/2017    1:20 PM  Advanced Directives  Does Patient Have a Medical Advance Directive? Yes No No  Type of Electronics engineer of Healthcare Power of Attorney in Chart? No - copy requested    Would patient like information on creating a medical advance directive?  No - Patient declined     Current Medications (verified) Outpatient Encounter Medications as of 05/06/2023  Medication Sig   ARIPiprazole (ABILIFY) 5 MG tablet TAKE 1 TABLET(5 MG) BY MOUTH DAILY   balsalazide (COLAZAL) 750 MG capsule TAKE 2 CAPSULES(1500 MG) BY MOUTH THREE TIMES DAILY   cholecalciferol (VITAMIN D3) 25 MCG (1000 UNIT) tablet Take 1,000 Units by mouth daily.   folic acid (FOLVITE) 1 MG tablet Take 2 mg by mouth daily.   levothyroxine (SYNTHROID) 75 MCG tablet Take 1 tablet (75 mcg total) by mouth daily. Take 6 days a week only   Multiple Vitamins-Minerals (CENTRUM SILVER 50+WOMEN) TABS Take by mouth.   Omega 3 1200 MG CAPS Take 1 capsule (1,200 mg total) by mouth daily.   pantoprazole (PROTONIX) 40 MG tablet TAKE 1 TABLET BY MOUTH TWICE DAILY FOR 1 TO 2 WEEKS THEN DECREASE TO ONCE DAILY. TAKE 30 MINUTES PRIOR TO A MEAL   Potassium Gluconate 550 MG TABS Take 2 tablets by mouth daily.    rosuvastatin (CRESTOR) 5 MG tablet TAKE 1 TABLET(5 MG) BY MOUTH DAILY   Sennosides (EX-LAX PO) Take by mouth as directed.   valACYclovir (VALTREX) 500 MG tablet TAKE 1 TABLET(500 MG) BY MOUTH DAILY   venlafaxine XR (EFFEXOR-XR) 150 MG 24 hr capsule TAKE 1  CAPSULE BY MOUTH EVERY MORNING WITH BREAKFAST   venlafaxine XR (EFFEXOR-XR) 75 MG 24 hr capsule TAKE 1 CAPSULE BY MOUTH DAILY WITH BREAKFAST IN ADDITION TO 150 MG DAILY   b complex vitamins capsule Take 1 capsule by mouth daily. (Patient not taking: Reported on 05/06/2023)   Calcium Carbonate-Vitamin D 600-5 MG-MCG TABS Take 1 tablet by mouth daily. (Patient not taking: Reported on 05/06/2023)   [DISCONTINUED] ARIPiprazole (ABILIFY) 5 MG tablet TAKE 1 TABLET(5 MG) BY MOUTH DAILY   No facility-administered encounter medications on file as of 05/06/2023.    Allergies (verified) Patient has no known allergies.   History: Past Medical History:  Diagnosis Date   Alcohol abuse 05/14/2016   ALCOHOL ABUSE, HX OF    ANEMIA    ANXIETY DISORDER    Basal cell carcinoma (BCC) 01/10/2018   C. difficile colitis    CROHN'S DISEASE    DEPRESSION    DRY EYE SYNDROME    HEPATOMEGALY, HX OF    HYPERLIPIDEMIA    intol of statin trials   HYPOTHYROIDISM    OSTEOPENIA    Osteoporosis 05/2017   states Pre osteoporosis 8/18   Rectovaginal fistula 2012   chron's disease   Tubular adenoma 2013   Past Surgical History:  Procedure Laterality Date   ANAL FISSURE  REPAIR     CATARACT EXTRACTION     EYE SURGERY  11/2017   macular pucker   HEMORRHOID SURGERY     IR RADIOLOGIST EVAL & MGMT  08/05/2021   Left thumb surgery  1989   MOHS SURGERY  2008   Nose BCC   Theraputic abortion     TOTAL HIP ARTHROPLASTY Right    09/2021   Family History  Adopted: Yes  Problem Relation Age of Onset   Breast cancer Neg Hx    Social History   Socioeconomic History   Marital status: Divorced    Spouse name: Not on file   Number of children: Not on file   Years of education: Not on file   Highest education level: Not on file  Occupational History   Occupation: Retider  Tobacco Use   Smoking status: Never   Smokeless tobacco: Never  Vaping Use   Vaping status: Never Used  Substance and Sexual Activity    Alcohol use: Yes    Comment: has drank excessively, not drinking daily   Drug use: No   Sexual activity: Not on file  Other Topics Concern   Not on file  Social History Narrative   Works at [employer]- part time   Social Determinants of Corporate investment banker Strain: Low Risk  (05/06/2023)   Overall Financial Resource Strain (CARDIA)    Difficulty of Paying Living Expenses: Not very hard  Food Insecurity: No Food Insecurity (05/06/2023)   Hunger Vital Sign    Worried About Running Out of Food in the Last Year: Never true    Ran Out of Food in the Last Year: Never true  Transportation Needs: No Transportation Needs (05/06/2023)   PRAPARE - Administrator, Civil Service (Medical): No    Lack of Transportation (Non-Medical): No  Physical Activity: Inactive (05/06/2023)   Exercise Vital Sign    Days of Exercise per Week: 0 days    Minutes of Exercise per Session: 0 min  Stress: No Stress Concern Present (05/06/2023)   [employer] of Occupational Health - Occupational Stress Questionnaire    Feeling of Stress : Not at all  Social Connections: Moderately Isolated (05/06/2023)   Social Connection and Isolation Panel [NHANES]    Frequency of Communication with Friends and Family: Three times a week    Frequency of Social Gatherings with Friends and Family: Once a week    Attends Religious Services: Never    Database administrator or Organizations: Yes    Attends Engineer, structural: 1 to 4 times per year    Marital Status: Divorced    Tobacco Counseling Counseling given: Not Answered   Clinical Intake:  Pre-visit preparation completed: Yes  Pain : No/denies pain     BMI - recorded: 25.09 Nutritional Risks: None Diabetes: No  How often do you need to have someone help you when you read instructions, pamphlets, or other written materials from your doctor or pharmacy?: 1 - Never  Interpreter Needed?:  No  Information entered by :: Gerald Kuehl,RMA   Activities of Daily Living    05/06/2023    1:43 PM 06/01/2022   11:37 AM  In your present state of health, do you have any difficulty performing the following activities:  Hearing? 0 1  Vision? 0 0  Difficulty concentrating or making decisions? 0 0  Walking or climbing stairs? 0 0  Dressing or bathing? 0 0  Doing errands, shopping?  0 0  Preparing Food and eating ? N N  Using the Toilet?  N  In the past six months, have you accidently leaked urine? N N  Do you have problems with loss of bowel control? N N  Managing your Medications? N N  Managing your Finances? N N  Housekeeping or managing your Housekeeping? N N    Patient Care Team: Pincus Sanes, MD as PCP - General (Internal Medicine) Hart Carwin, MD (Inactive) as Referring Physician (Gastroenterology)  Indicate any recent Medical Services you may have received from other than [facility] providers in the past year (date may be approximate).     Assessment:   This is a routine wellness examination for Tammy Kennedy.  Hearing/Vision screen Hearing Screening - Comments:: Denies hearing difficulties   Vision Screening - Comments:: Denies eye issues  Dietary issues and exercise activities discussed:     Goals Addressed             This Visit's Progress    MY GOAL IS TO INCREASE MY PHYSICAL ACTIIVITY AND GET MORE MOTIVATED.        Depression Screen    05/06/2023    1:52 PM 01/26/2023   11:23 AM 12/04/2022   11:08 AM 06/02/2022    3:06 PM 06/01/2022   11:37 AM 04/07/2022    1:23 PM 06/05/2019    9:03 AM  PHQ 2/9 Scores  PHQ - 2 Score 2  0 0 0 6 5  PHQ- 9 Score 4   2  19 15      Information is confidential and restricted. Go to Review Flowsheets to unlock data.     Fall Risk    05/06/2023    1:47 PM 12/04/2022   11:08 AM 06/02/2022    3:06 PM 06/01/2022   11:36 AM 04/07/2022    1:23 PM  Fall Risk   Falls in the past year? 0 0 0 0 0  Number falls in past yr: 0 0 0 0    Injury with Fall? 0 0 0 0   Risk for fall due to : No Fall Risks No Fall Risks No Fall Risks No Fall Risks   Follow up Falls prevention discussed Falls evaluation completed Falls evaluation completed Falls evaluation completed     MEDICARE RISK AT HOME:  Medicare Risk at Home - 05/06/23 1348     Any stairs in or around the home? Yes    If so, are there any without handrails? Yes    Home free of loose throw rugs in walkways, pet beds, electrical cords, etc? Yes    Adequate lighting in your home to reduce risk of falls? Yes    Life alert? No    Use of a cane, walker or w/c? No    Grab bars in the bathroom? No    Shower chair or bench in shower? Yes    Elevated toilet seat or a handicapped toilet? No             TIMED UP AND GO:  Was the test performed?  Yes  Length of time to ambulate 10 feet: 10 sec Gait steady and fast without use of assistive device    Cognitive Function:        05/06/2023    1:49 PM 06/01/2022   11:38 AM  6CIT Screen  What Year? 0 points 0 points  What month? 0 points 0 points  What time? 0 points 0 points  Count back from 20 0  points 0 points  Months in reverse 0 points 0 points  Repeat phrase 0 points 0 points  Total Score 0 points 0 points    Immunizations Immunization History  Administered Date(s) Administered   PFIZER(Purple Top)SARS-COV-2 Vaccination 01/05/2020, 01/26/2020, 08/24/2020   PNEUMOCOCCAL CONJUGATE-20 06/10/2021   Pfizer Covid-19 Vaccine Bivalent Booster 47yrs & up 08/22/2021   Pneumococcal Conjugate-13 04/24/2015   Pneumococcal Polysaccharide-23 03/15/2013   Td 07/25/2007   Tdap 06/05/2019    TDAP status: Up to date  Flu Vaccine status: Declined, Education has been provided regarding the importance of this vaccine but patient still declined. Advised may receive this vaccine at local pharmacy or Health Dept. Aware to provide a copy of the vaccination record if obtained from local pharmacy or Health Dept. Verbalized  acceptance and understanding.  Pneumococcal vaccine status: Up to date  Covid-19 vaccine status: Completed vaccines  Qualifies for Shingles Vaccine? Yes   Zostavax completed No   Shingrix Completed?: No.    Education has been provided regarding the importance of this vaccine. Patient has been advised to call insurance company to determine out of pocket expense if they have not yet received this vaccine. Advised may also receive vaccine at local pharmacy or Health Dept. Verbalized acceptance and understanding.  Screening Tests Health Maintenance  Topic Date Due   Zoster Vaccines- Shingrix (1 of 2) Never done   COVID-19 Vaccine (5 - 2023-24 season) 06/12/2022   INFLUENZA VACCINE  05/13/2023   DEXA SCAN  06/07/2023   Medicare Annual Wellness (AWV)  05/05/2024   MAMMOGRAM  05/13/2024   Colonoscopy  05/03/2026   DTaP/Tdap/Td (3 - Td or Tdap) 06/04/2029   Pneumonia Vaccine 36+ Years old  Completed   Hepatitis C Screening  Completed   HPV VACCINES  Aged Out    Health Maintenance  Health Maintenance Due  Topic Date Due   Zoster Vaccines- Shingrix (1 of 2) Never done   COVID-19 Vaccine (5 - 2023-24 season) 06/12/2022    Colorectal cancer screening: Type of screening: Colonoscopy. Completed 05/04/2023. Repeat every 3 years  Mammogram status: Ordered 05/13/2022. Pt provided with contact info and advised to call to schedule appt.   Lung Cancer Screening: (Low Dose CT Chest recommended if Age 13-80 years, 20 pack-year currently smoking OR have quit w/in 15years.) does not qualify.   Lung Cancer Screening Referral: N/A  Additional Screening:  Hepatitis C Screening: does qualify; Completed 11/16/2016  Vision Screening: Recommended annual ophthalmology exams for early detection of glaucoma and other disorders of the eye. Is the patient up to date with their annual eye exam?  No  Who is the provider or what is the name of the office in which the patient attends annual eye exams?  [facility] If pt is not established with a provider, would they like to be referred to a provider to establish care? No .   Dental Screening: Recommended annual dental exams for proper oral hygiene   Community Resource Referral / Chronic Care Management: CRR required this visit?  No   CCM required this visit?  No     Plan:     I have personally reviewed and noted the following in the patient's chart:   Medical and social history Use of alcohol, tobacco or illicit drugs  Current medications and supplements including opioid prescriptions. Patient is not currently taking opioid prescriptions. Functional ability and status Nutritional status Physical activity Advanced directives List of other physicians Hospitalizations, surgeries, and ER visits in previous 12 months Vitals Screenings  to include cognitive, depression, and falls Referrals and appointments  In addition, I have reviewed and discussed with patient certain preventive protocols, quality metrics, and best practice recommendations. A written personalized care plan for preventive services as well as general preventive health recommendations were provided to patient.     Adriana Lina L Justinn Welter, CMA   05/06/2023   After Visit Summary: (MyChart) Due to this being a telephonic visit, the after visit summary with patients personalized plan was offered to patient via MyChart   Nurse Notes: Patient is due for annual eye exam and is scheduled in September.  Patient is due for a DEXA but would like to speak with Dr. Lawerance Bach about it during her up coming visit in August.    Medical screening examination/treatment/procedure(s) were performed by non-physician practitioner and as supervising physician I was immediately available for consultation/collaboration.  I agree with above. Jacinta Shoe, MD

## 2023-05-06 NOTE — Patient Instructions (Signed)
Tammy Kennedy , Thank you for taking time to come for your Medicare Wellness Visit. I appreciate your ongoing commitment to your health goals. Please review the following plan we discussed and let me know if I can assist you in the future.   Referrals/Orders/Follow-Ups/Clinician Recommendations: Bone density screening never been performed.  Remember to speak with PCP about this screening.   This is a list of the screening recommended for you and due dates:  Health Maintenance  Topic Date Due   Zoster (Shingles) Vaccine (1 of 2) Never done   COVID-19 Vaccine (5 - 2023-24 season) 06/12/2022   Flu Shot  05/13/2023   DEXA scan (bone density measurement)  06/07/2023   Medicare Annual Wellness Visit  05/05/2024   Mammogram  05/13/2024   Colon Cancer Screening  05/03/2026   DTaP/Tdap/Td vaccine (3 - Td or Tdap) 06/04/2029   Pneumonia Vaccine  Completed   Hepatitis C Screening  Completed   HPV Vaccine  Aged Out    Advanced directives: (Copy Requested) Please bring a copy of your health care power of attorney and living will to the office to be added to your chart at your convenience.  Next Medicare Annual Wellness Visit scheduled for next year: Yes  Preventive Care 40 Years and Older, Female Preventive care refers to lifestyle choices and visits with your health care provider that can promote health and wellness. What does preventive care include? A yearly physical exam. This is also called an annual well check. Dental exams once or twice a year. Routine eye exams. Ask your health care provider how often you should have your eyes checked. Personal lifestyle choices, including: Daily care of your teeth and gums. Regular physical activity. Eating a healthy diet. Avoiding tobacco and drug use. Limiting alcohol use. Practicing safe sex. Taking low-dose aspirin every day. Taking vitamin and mineral supplements as recommended by your health care provider. What happens during an annual well  check? The services and screenings done by your health care provider during your annual well check will depend on your age, overall health, lifestyle risk factors, and family history of disease. Counseling  Your health care provider may ask you questions about your: Alcohol use. Tobacco use. Drug use. Emotional well-being. Home and relationship well-being. Sexual activity. Eating habits. History of falls. Memory and ability to understand (cognition). Work and work Astronomer. Reproductive health. Screening  You may have the following tests or measurements: Height, weight, and BMI. Blood pressure. Lipid and cholesterol levels. These may be checked every 5 years, or more frequently if you are over 65 years old. Skin check. Lung cancer screening. You may have this screening every year starting at age 83 if you have a 30-pack-year history of smoking and currently smoke or have quit within the past 15 years. Fecal occult blood test (FOBT) of the stool. You may have this test every year starting at age 4. Flexible sigmoidoscopy or colonoscopy. You may have a sigmoidoscopy every 5 years or a colonoscopy every 10 years starting at age 18. Hepatitis C blood test. Hepatitis B blood test. Sexually transmitted disease (STD) testing. Diabetes screening. This is done by checking your blood sugar (glucose) after you have not eaten for a while (fasting). You may have this done every 1-3 years. Bone density scan. This is done to screen for osteoporosis. You may have this done starting at age 3. Mammogram. This may be done every 1-2 years. Talk to your health care provider about how often you should have regular  mammograms. Talk with your health care provider about your test results, treatment options, and if necessary, the need for more tests. Vaccines  Your health care provider may recommend certain vaccines, such as: Influenza vaccine. This is recommended every year. Tetanus, diphtheria, and  acellular pertussis (Tdap, Td) vaccine. You may need a Td booster every 10 years. Zoster vaccine. You may need this after age 31. Pneumococcal 13-valent conjugate (PCV13) vaccine. One dose is recommended after age 33. Pneumococcal polysaccharide (PPSV23) vaccine. One dose is recommended after age 29. Talk to your health care provider about which screenings and vaccines you need and how often you need them. This information is not intended to replace advice given to you by your health care provider. Make sure you discuss any questions you have with your health care provider. Document Released: 10/25/2015 Document Revised: 06/17/2016 Document Reviewed: 07/30/2015 Elsevier Interactive Patient Education  2017 [employer].  Fall Prevention in the Home Falls can cause injuries. They can happen to people of all ages. There are many things you can do to make your home safe and to help prevent falls. What can I do on the outside of my home? Regularly fix the edges of walkways and driveways and fix any cracks. Remove anything that might make you trip as you walk through a door, such as a raised step or threshold. Trim any bushes or trees on the path to your home. Use bright outdoor lighting. Clear any walking paths of anything that might make someone trip, such as rocks or tools. Regularly check to see if handrails are loose or broken. Make sure that both sides of any steps have handrails. Any raised decks and porches should have guardrails on the edges. Have any leaves, snow, or ice cleared regularly. Use sand or salt on walking paths during winter. Clean up any spills in your garage right away. This includes oil or grease spills. What can I do in the bathroom? Use night lights. Install grab bars by the toilet and in the tub and shower. Do not use towel bars as grab bars. Use non-skid mats or decals in the tub or shower. If you need to sit down in the shower, use a plastic, non-slip stool. Keep  the floor dry. Clean up any water that spills on the floor as soon as it happens. Remove soap buildup in the tub or shower regularly. Attach bath mats securely with double-sided non-slip rug tape. Do not have throw rugs and other things on the floor that can make you trip. What can I do in the bedroom? Use night lights. Make sure that you have a light by your bed that is easy to reach. Do not use any sheets or blankets that are too big for your bed. They should not hang down onto the floor. Have a firm chair that has side arms. You can use this for support while you get dressed. Do not have throw rugs and other things on the floor that can make you trip. What can I do in the kitchen? Clean up any spills right away. Avoid walking on wet floors. Keep items that you use a lot in easy-to-reach places. If you need to reach something above you, use a strong step stool that has a grab bar. Keep electrical cords out of the way. Do not use floor polish or wax that makes floors slippery. If you must use wax, use non-skid floor wax. Do not have throw rugs and other things on the floor that can  make you trip. What can I do with my stairs? Do not leave any items on the stairs. Make sure that there are handrails on both sides of the stairs and use them. Fix handrails that are broken or loose. Make sure that handrails are as long as the stairways. Check any carpeting to make sure that it is firmly attached to the stairs. Fix any carpet that is loose or worn. Avoid having throw rugs at the top or bottom of the stairs. If you do have throw rugs, attach them to the floor with carpet tape. Make sure that you have a light switch at the top of the stairs and the bottom of the stairs. If you do not have them, ask someone to add them for you. What else can I do to help prevent falls? Wear shoes that: Do not have high heels. Have rubber bottoms. Are comfortable and fit you well. Are closed at the toe. Do not  wear sandals. If you use a stepladder: Make sure that it is fully opened. Do not climb a closed stepladder. Make sure that both sides of the stepladder are locked into place. Ask someone to hold it for you, if possible. Clearly mark and make sure that you can see: Any grab bars or handrails. First and last steps. Where the edge of each step is. Use tools that help you move around (mobility aids) if they are needed. These include: Canes. Walkers. Scooters. Crutches. Turn on the lights when you go into a dark area. Replace any light bulbs as soon as they burn out. Set up your furniture so you have a clear path. Avoid moving your furniture around. If any of your floors are uneven, fix them. If there are any pets around you, be aware of where they are. Review your medicines with your doctor. Some medicines can make you feel dizzy. This can increase your chance of falling. Ask your doctor what other things that you can do to help prevent falls. This information is not intended to replace advice given to you by your health care provider. Make sure you discuss any questions you have with your health care provider. Document Released: 07/25/2009 Document Revised: 03/05/2016 Document Reviewed: 11/02/2014 Elsevier Interactive Patient Education  2017 [employer].

## 2023-05-11 ENCOUNTER — Ambulatory Visit (INDEPENDENT_AMBULATORY_CARE_PROVIDER_SITE_OTHER): Payer: Medicare Other | Admitting: Psychology

## 2023-05-11 DIAGNOSIS — F3289 Other specified depressive episodes: Secondary | ICD-10-CM | POA: Diagnosis not present

## 2023-05-11 NOTE — Progress Notes (Signed)
[facility] Counselor/Therapist Progress Note  Patient ID: Tammy Kennedy, MRN: [id number]    Date: 05/11/23  Time Spent: 2:04 am - 2:59 am : 55 Minutes  Treatment Type: Individual Therapy.  Reported Symptoms: depression and anxiety.   Mental Status Exam: Appearance:  Casual     Behavior: Appropriate  Motor: Normal  Speech/Language:  Clear and Coherent  Affect: Flat  Mood: anxious  Thought process: normal  Thought content:   WNL  Sensory/Perceptual disturbances:   WNL  Orientation: oriented to person, place, time/date, and situation  Attention: Good  Concentration: Good  Memory: WNL  Fund of knowledge:  Good  Insight:   Good  Judgment:  Good  Impulse Control: Good   Risk Assessment: Danger to Self:  No Self-injurious Behavior: No Danger to Others: No Duty to Warn:no Physical Aggression / Violence:No  Access to Firearms a concern: No  Gang Involvement:No   Subjective:   Tammy Kennedy participated in the session, from the office, with the therapist. Tammy Kennedy noted making progress in managing her grief. She noted continuing to put off leaving the home and noted this results in more anxiety and increased barriers to leave the home. She noted wanting increase her motivation and noted "always procrastinating". 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 She noted often leaning towards enjoyable activities in lieu of unenjoyable tasks. She noted her motivation shifted in late 30's after her divorce and noted the realization that the cost of not completing most tasks isn't high. 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 "At that point I realized that I didn't have to push myself so hard to get things perfect. That I could let things go. That I didn't have to be perfect.". She added "I used to think everything had to be on". She  noted things previously having to be "perfect". She noted that her test is "is the world going to end?". Therapist highlighted that this system is binary leading to generalizing and delay in engagement. She discussed the task of emptying numerous boxes that have been in her house for a long period of time and the lack of action towards addressing this.  We worked on identifying ways to reclassify tasks and build a system that is more nuanced. She discussed barriers to task engagement including not having room, lack of motivation, the lack of perceived importance of the task, and the scope of the task including time, energy, and duration to task completion. We worked on challenging this via identifying the positives of engagement and the negatives of not engaging. The positive include being able to clean, organize/decorate, invite friends and socialize, not feel social judgement or critique due to the state of the house, having a peaceful feeling, and organized belongings. She noted by not engaging, it would be tough to feel comfortable in her home and that these boxes could invite bugs into the home.  We worked on [employer] of BA including scheduling, setting reasonable expectations, breaking down larger tasks into smaller more manageable tasks, preparation, managing negative self-talk, time behavior to values.  Therapist modeled this during the session.  Therapist highlighted Tammy Kennedy's overemphasis on addressing tasks a specific and rigid way, which could affect task engagement and completion.  Tammy Kennedy was engaged and motivated during the session and expressed towards her goals.  We set a tentative schedule and discussed the importance  of managing barriers should they arise.  Therapist validated and normalized Tammy Kennedy's feelings and experience and provided supportive therapy.  Follow-up was scheduled for continued treatment.  Interventions: CBT & BA  Diagnosis:   Other depression  Psychiatric Treatment:  Yes , [name], MD.   Treatment Plan:  Client Abilities/Strengths Tammy Kennedy is intelligent, self-aware, and motivated for change.   Support System: Friends.   Client Treatment Preferences Outpatient therapy.   Client Statement of Needs Tammy Kennedy would like to become more social, reduce procrastination, manage her symptoms, increase motivation to lead to task completion. Additionally, we highlighted reducing rumination, challenging negative thoughts, feelings, and self-talk, creating a routine to support a positive mood, problem-solving, and engagement in enjoyable activities.   Treatment Level Weekly  Symptoms  Anxiety: feeling nervous, difficulty managing worry, worrying about different things, trouble relaxing, and restlessness.    (Status: maintained) Depression: loss of interest, feeling down, lethargy, fluctuating appetite, feeling bad about self, and difficulty concentrating.     (Status: maintained)  Goals:   Tammy Kennedy experiences symptoms of depression and anxiety.    Target Date: 02/08/24 Frequency: Weekly  Progress: 0 Modality: individual    Therapist will provide referrals for additional resources as appropriate.  Therapist will provide psycho-education regarding Elease's diagnosis and corresponding treatment approaches and interventions. Licensed Clinical Social Worker, Reedy, LCSW will support the patient's ability to achieve the goals identified. will employ CBT, BA, Problem-solving, Solution Focused, Mindfulness,  coping skills, & other evidenced-based practices will be used to promote progress towards healthy functioning to help manage decrease symptoms associated with her diagnosis.   Reduce overall level, frequency, and intensity of the feelings of depression and anxiety as evidenced by decreased overall symptoms from 6 to 7 days/week to 0 to 1 days/week per client report for at least 3 consecutive months. Verbally express understanding of the relationship  between feelings of depression, anxiety and their impact on thinking patterns and behaviors. Verbalize an understanding of the role that distorted thinking plays in creating fears, excessive worry, and ruminations.  [name] participated in the creation of the treatment plan)    [name], LCSW

## 2023-05-12 ENCOUNTER — Encounter (INDEPENDENT_AMBULATORY_CARE_PROVIDER_SITE_OTHER): Payer: Self-pay

## 2023-05-14 ENCOUNTER — Encounter: Payer: Self-pay | Admitting: Gastroenterology

## 2023-05-17 ENCOUNTER — Ambulatory Visit (HOSPITAL_BASED_OUTPATIENT_CLINIC_OR_DEPARTMENT_OTHER): Payer: Medicare Other | Admitting: Student

## 2023-05-17 VITALS — BP 99/69 | HR 68 | Ht 59.0 in | Wt 123.0 lb

## 2023-05-17 DIAGNOSIS — F411 Generalized anxiety disorder: Secondary | ICD-10-CM | POA: Diagnosis not present

## 2023-05-17 DIAGNOSIS — F3341 Major depressive disorder, recurrent, in partial remission: Secondary | ICD-10-CM

## 2023-05-17 MED ORDER — ARIPIPRAZOLE 2 MG PO TABS
2.0000 mg | ORAL_TABLET | Freq: Every day | ORAL | 0 refills | Status: DC
Start: 2023-05-17 — End: 2023-06-04

## 2023-05-18 ENCOUNTER — Encounter (HOSPITAL_COMMUNITY): Payer: Self-pay | Admitting: Student

## 2023-05-18 ENCOUNTER — Telehealth: Payer: Self-pay | Admitting: Internal Medicine

## 2023-05-18 DIAGNOSIS — F3341 Major depressive disorder, recurrent, in partial remission: Secondary | ICD-10-CM | POA: Insufficient documentation

## 2023-05-18 DIAGNOSIS — F329 Major depressive disorder, single episode, unspecified: Secondary | ICD-10-CM | POA: Insufficient documentation

## 2023-05-18 NOTE — Telephone Encounter (Signed)
-----   Message from [name] sent at 05/18/2023 11:11 AM EDT ----- Hello Dr Lawerance Bach, we met Tammy Kennedy yesterday in our clinic. You can see my note for our recommendations, but I had a quick questions about her Synthroid. Her last TSH was low and she seems to still be on the same dose of synthroid. My concern is that if she is taking more synthroid than she needs that that could worsen her anxiety. Does the dose need to be lowered? I just want to make sure nothing is being missed.

## 2023-05-18 NOTE — Telephone Encounter (Signed)
Patient coming in for lab tomorrow after mammogram.

## 2023-05-18 NOTE — Telephone Encounter (Signed)
Please call her.  She was post come back to have her TSH rechecked after her medication dose was adjusted in February.  She should be taking her thyroid medication 6 days a week only.  She needs to come back to have her TSH rechecked-this was ordered at that time.
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
## 2023-05-19 ENCOUNTER — Other Ambulatory Visit (INDEPENDENT_AMBULATORY_CARE_PROVIDER_SITE_OTHER): Payer: Medicare Other

## 2023-05-19 ENCOUNTER — Ambulatory Visit: Admission: RE | Admit: 2023-05-19 | Payer: Medicare Other | Source: Ambulatory Visit

## 2023-05-19 DIAGNOSIS — E038 Other specified hypothyroidism: Secondary | ICD-10-CM

## 2023-05-19 DIAGNOSIS — Z1231 Encounter for screening mammogram for malignant neoplasm of breast: Secondary | ICD-10-CM

## 2023-05-19 LAB — TSH: TSH: 0.18 u[IU]/mL — ABNORMAL LOW (ref 0.35–5.50)

## 2023-05-19 NOTE — Addendum Note (Signed)
Addended by: Everlena Cooper on: 05/19/2023 09:01 AM   Modules accepted: Level of Service

## 2023-05-20 MED ORDER — LEVOTHYROXINE SODIUM 50 MCG PO TABS: 50.0000 ug | ORAL_TABLET | Freq: Every day | ORAL | 1 refills | Status: DC

## 2023-05-25 ENCOUNTER — Ambulatory Visit (INDEPENDENT_AMBULATORY_CARE_PROVIDER_SITE_OTHER): Payer: Medicare Other | Admitting: Psychology

## 2023-05-25 DIAGNOSIS — F3341 Major depressive disorder, recurrent, in partial remission: Secondary | ICD-10-CM | POA: Diagnosis not present

## 2023-05-25 DIAGNOSIS — F411 Generalized anxiety disorder: Secondary | ICD-10-CM

## 2023-05-25 NOTE — Progress Notes (Signed)
[facility] Counselor/Therapist Progress Note  Patient ID: Tammy Kennedy, MRN: [id number]    Date: 05/25/23  Time Spent: 10:06 am - 10:46 am : 40 Minutes  Treatment Type: Individual Therapy.  Reported Symptoms: depression and anxiety.   Mental Status Exam: Appearance:  Casual     Behavior: Appropriate  Motor: Normal  Speech/Language:  Clear and Coherent  Affect: Flat  Mood: anxious  Thought process: normal  Thought content:   WNL  Sensory/Perceptual disturbances:   WNL  Orientation: oriented to person, place, time/date, and situation  Attention: Good  Concentration: Good  Memory: WNL  Fund of knowledge:  Good  Insight:   Good  Judgment:  Good  Impulse Control: Good   Risk Assessment: Danger to Self:  No Self-injurious Behavior: No Danger to Others: No Duty to Warn:no Physical Aggression / Violence:No  Access to Firearms a concern: No  Gang Involvement:No   Subjective:   Tammy Kennedy participated in the session, from the office, with the therapist. Tammy Kennedy noted her upcoming birthday and discussed accepting invites to different social gatherings, which therapist praised. She noted not accomplishing her session goal between sessions. She noted the barriers to this including her house mate using the table that Tammy Kennedy was going to use to start getting organized. She noted her roommate asking for help regarding a task that occupied Tammy Kennedy's time. We worked on identifying and addressing barriers going forward to task completion. She did work on preparation and noted moving the needed items closer to the table to engage in the task. We worked on identifying any other possible barriers. She noted meeting with her new psychiatric provider, Tammy Kennedy - MD, and noted a reduction in her Abilify and an adjustment to her thyroid medicine. She is awaiting additional labs regarding her thyroid. She noted being medication compliant. We reviewed BA and the importance of  scheduling, setting reasonable goals, highlighting progress, and addressing barriers proactively. She noted a lack of exercise due to her dizziness but noted this being possibly a side-effect of her Abilify which was recently reduced in dosage. We discussed identifying ways to address this barrier temporarily including doing chair exercises. We worked on additional resourcing to to work on managing her anxiety and therapist reviewed and modeled isometric muscle exercises. Exercises were provided via email for reference and review. Tammy Kennedy was engaged and motivated during the session and expressed commitment to session goals. Therapist provided supportive therapy and a follow-up was scheduled for continued treatment.   Interventions: CBT, relaxation, BA  Diagnosis:   MDD (major depressive disorder), recurrent, in partial remission (HCC)  Generalized anxiety disorder  Psychiatric Treatment: Yes , Tammy Cockayne, MD.   Treatment Plan:  Client Abilities/Strengths Tammy Kennedy is intelligent, self-aware, and motivated for change.   Support System: Friends.   Client Treatment Preferences Outpatient therapy.   Client Statement of Needs Tammy Kennedy would like to become more social, reduce procrastination, manage her symptoms, increase motivation to lead to task completion. Additionally, we highlighted reducing rumination, challenging negative thoughts, feelings, and self-talk, creating a routine to support a positive mood, problem-solving, and engagement in enjoyable activities.   Treatment Level Weekly  Symptoms  Anxiety: feeling nervous, difficulty managing worry, worrying about different things, trouble relaxing, and restlessness.    (Status: maintained) Depression: loss of interest, feeling down, lethargy, fluctuating appetite, feeling bad about self, and difficulty concentrating.     (Status: maintained)  Goals:   Tammy Kennedy experiences symptoms of depression and anxiety.    Target Date:  02/08/24  Frequency: Weekly  Progress: 0 Modality: individual    Therapist will provide referrals for additional resources as appropriate.  Therapist will provide psycho-education regarding Tammy Kennedy's diagnosis and corresponding treatment approaches and interventions. Licensed Clinical Social Worker, Tammy [place], LCSW will support the patient's ability to achieve the goals identified. will employ CBT, BA, Problem-solving, Solution Focused, Mindfulness,  coping skills, & other evidenced-based practices will be used to promote progress towards healthy functioning to help manage decrease symptoms associated with her diagnosis.   Reduce overall level, frequency, and intensity of the feelings of depression and anxiety as evidenced by decreased overall symptoms from 6 to 7 days/week to 0 to 1 days/week per client report for at least 3 consecutive months. Verbally express understanding of the relationship between feelings of depression, anxiety and their impact on thinking patterns and behaviors. Verbalize an understanding of the role that distorted thinking plays in creating fears, excessive worry, and ruminations.  [name] participated in the creation of the treatment plan)    [name], LCSW

## 2023-06-01 DIAGNOSIS — K080 Exfoliation of teeth due to systemic causes: Secondary | ICD-10-CM | POA: Diagnosis not present

## 2023-06-03 ENCOUNTER — Other Ambulatory Visit: Payer: Self-pay | Admitting: Internal Medicine

## 2023-06-03 ENCOUNTER — Encounter: Payer: Self-pay | Admitting: Internal Medicine

## 2023-06-03 NOTE — Progress Notes (Signed)
Subjective:    Patient ID: Tammy Kennedy, female    DOB: [birth year], 67 y.o.   MRN: [id number]     HPI Tammy Kennedy is here for follow up of her chronic medical problems.  No regular exercise.   Eating pretty good.  Decreased dose of Abilify has improved the dizziness a little.  Still feels pretty good-has only been 2 weeks  Medications and allergies reviewed with patient and updated if appropriate.  Current Outpatient Medications on File Prior to Visit  Medication Sig Dispense Refill   ARIPiprazole (ABILIFY) 2 MG tablet Take 1 tablet (2 mg total) by mouth daily. 30 tablet 0   balsalazide (COLAZAL) 750 MG capsule TAKE 2 CAPSULES(1500 MG) BY MOUTH THREE TIMES DAILY 210 capsule 5   cholecalciferol (VITAMIN D3) 25 MCG (1000 UNIT) tablet Take 1,000 Units by mouth daily.     folic acid (FOLVITE) 1 MG tablet Take 2 mg by mouth daily.     levothyroxine (SYNTHROID) 50 MCG tablet Take 1 tablet (50 mcg total) by mouth daily. 90 tablet 1   Multiple Vitamins-Minerals (CENTRUM SILVER 50+WOMEN) TABS Take by mouth.     Omega 3 1200 MG CAPS Take 1 capsule (1,200 mg total) by mouth daily.     pantoprazole (PROTONIX) 40 MG tablet TAKE 1 TABLET BY MOUTH TWICE DAILY FOR 1 TO 2 WEEKS THEN DECREASE TO ONCE DAILY. TAKE 30 MINUTES PRIOR TO A MEAL 60 tablet 3   Potassium Gluconate 550 MG TABS Take 2 tablets by mouth daily.      rosuvastatin (CRESTOR) 5 MG tablet TAKE 1 TABLET(5 MG) BY MOUTH DAILY 90 tablet 3   Sennosides (EX-LAX PO) Take by mouth as directed.     valACYclovir (VALTREX) 500 MG tablet TAKE 1 TABLET(500 MG) BY MOUTH DAILY 90 tablet 1   venlafaxine XR (EFFEXOR-XR) 150 MG 24 hr capsule TAKE 1 CAPSULE BY MOUTH EVERY MORNING WITH BREAKFAST 90 capsule 1   venlafaxine XR (EFFEXOR-XR) 75 MG 24 hr capsule TAKE 1 CAPSULE BY MOUTH DAILY WITH BREAKFAST IN ADDITION TO 150 MG DAILY 90 capsule 0   No current facility-administered medications on file prior to visit.     Review of Systems   Constitutional:  Negative for fever.  Respiratory:  Positive for shortness of breath (sometimes in shower when there is steam). Negative for cough and wheezing.   Cardiovascular:  Negative for chest pain, palpitations and leg swelling.  Neurological:  Positive for dizziness. Negative for headaches.       Objective:   Vitals:   06/04/23 1042  BP: 104/68  Pulse: 80  Temp: 98.6 F (37 C)  SpO2: 95%   BP Readings from Last 3 Encounters:  06/04/23 104/68  05/04/23 (!) 92/55  02/19/23 100/60   Wt Readings from Last 3 Encounters:  06/04/23 125 lb 6.4 oz (56.9 kg)  05/06/23 124 lb 3.2 oz (56.3 kg)  05/04/23 121 lb (54.9 kg)   Body mass index is 25.33 kg/m.    Physical Exam Constitutional:      General: She is not in acute distress.    Appearance: Normal appearance.  HENT:     Head: Normocephalic and atraumatic.  Eyes:     Conjunctiva/sclera: Conjunctivae normal.  Cardiovascular:     Rate and Rhythm: Normal rate and regular rhythm.     Heart sounds: Normal heart sounds.  Pulmonary:     Effort: Pulmonary effort is normal. No respiratory distress.     Breath sounds: Normal  breath sounds. No wheezing.  Musculoskeletal:     Cervical back: Neck supple.     Right lower leg: No edema.     Left lower leg: No edema.  Lymphadenopathy:     Cervical: No cervical adenopathy.  Skin:    General: Skin is warm and dry.     Findings: No rash.  Neurological:     Mental Status: She is alert. Mental status is at baseline.  Psychiatric:        Mood and Affect: Mood normal.        Behavior: Behavior normal.        Lab Results  Component Value Date   WBC 5.9 12/04/2022   HGB 15.1 (H) 12/04/2022   HCT 45.5 12/04/2022   PLT 271.0 12/04/2022   GLUCOSE 84 12/04/2022   CHOL 222 (H) 12/04/2022   TRIG 385.0 (H) 12/04/2022   HDL 70.60 12/04/2022   LDLDIRECT 106.0 12/04/2022   LDLCALC 108 (H) 06/05/2020   ALT 17 12/04/2022   AST 41 (H) 12/04/2022   NA 139 12/04/2022   K 4.4  12/04/2022   CL 102 12/04/2022   CREATININE 0.76 12/04/2022   BUN 15 12/04/2022   CO2 28 12/04/2022   TSH 0.18 (L) 05/19/2023   INR 1.0 09/23/2021   HGBA1C 5.8 12/04/2022     Assessment & Plan:    See Problem List for Assessment and Plan of chronic medical problems.

## 2023-06-03 NOTE — Patient Instructions (Addendum)
      Blood work was ordered.  Have this done the middle of September    Medications changes include :  none       Return in about 6 months (around 12/05/2023) for Physical Exam, schedule lab appt for middle of September, Schedule DEXA-[facility].

## 2023-06-04 ENCOUNTER — Ambulatory Visit: Payer: Medicare Other | Admitting: Internal Medicine

## 2023-06-04 VITALS — BP 104/68 | HR 80 | Temp 98.6°F | Ht 59.0 in | Wt 125.4 lb

## 2023-06-04 DIAGNOSIS — M810 Age-related osteoporosis without current pathological fracture: Secondary | ICD-10-CM

## 2023-06-04 DIAGNOSIS — K50919 Crohn's disease, unspecified, with unspecified complications: Secondary | ICD-10-CM | POA: Diagnosis not present

## 2023-06-04 DIAGNOSIS — E038 Other specified hypothyroidism: Secondary | ICD-10-CM

## 2023-06-04 DIAGNOSIS — E785 Hyperlipidemia, unspecified: Secondary | ICD-10-CM

## 2023-06-04 DIAGNOSIS — F3341 Major depressive disorder, recurrent, in partial remission: Secondary | ICD-10-CM

## 2023-06-04 DIAGNOSIS — E559 Vitamin D deficiency, unspecified: Secondary | ICD-10-CM

## 2023-06-04 DIAGNOSIS — F411 Generalized anxiety disorder: Secondary | ICD-10-CM

## 2023-06-04 DIAGNOSIS — R739 Hyperglycemia, unspecified: Secondary | ICD-10-CM

## 2023-06-04 MED ORDER — ARIPIPRAZOLE 2 MG PO TABS
2.0000 mg | ORAL_TABLET | Freq: Every day | ORAL | 1 refills | Status: DC
Start: 2023-06-04 — End: 2023-11-09

## 2023-06-04 NOTE — Assessment & Plan Note (Addendum)
Chronic DEXA due-ordered Has been on Boniva in the past and had side effects Not exercising - stressed regular exercise Continue calcium and vitamin D supplementation Check vitamin D level Discussed medication and that she should consider taking something for her bones

## 2023-06-04 NOTE — Assessment & Plan Note (Signed)
Chronic Lab Results  Component Value Date   HGBA1C 5.8 12/04/2022    Check a1c Low sugar / carb diet Stressed regular exercise

## 2023-06-04 NOTE — Assessment & Plan Note (Signed)
Chronic Controlled Management per Dr. Lavon Paganini

## 2023-06-04 NOTE — Assessment & Plan Note (Signed)
Chronic Regular exercise and healthy diet encouraged Check lipid panel-will have done when she has her thyroid level rechecked Continue Crestor 5 mg daily

## 2023-06-04 NOTE — Assessment & Plan Note (Signed)
Chronic  Last TSH slightly low and medication dose was adjusted to 50 mcg daily Check tsh in about 6 weeks and will titrate med dose if needed Currently taking levothyroxine 50 mcg daily

## 2023-06-04 NOTE — Assessment & Plan Note (Signed)
Chronic Taking vitamin D daily Check vitamin D level  

## 2023-06-04 NOTE — Assessment & Plan Note (Addendum)
Chronic No longer seeing psychiatry doing therapy Controlled, Stable Continue Abilify 2 mg daily, Effexor 225 mg daily

## 2023-06-08 ENCOUNTER — Ambulatory Visit (INDEPENDENT_AMBULATORY_CARE_PROVIDER_SITE_OTHER): Payer: Medicare Other | Admitting: Psychology

## 2023-06-08 DIAGNOSIS — F4323 Adjustment disorder with mixed anxiety and depressed mood: Secondary | ICD-10-CM | POA: Diagnosis not present

## 2023-06-08 DIAGNOSIS — F3341 Major depressive disorder, recurrent, in partial remission: Secondary | ICD-10-CM | POA: Diagnosis not present

## 2023-06-08 DIAGNOSIS — F411 Generalized anxiety disorder: Secondary | ICD-10-CM | POA: Diagnosis not present

## 2023-06-08 NOTE — Progress Notes (Signed)
[facility] Counselor/Therapist Progress Note  Patient ID: Tammy Kennedy, MRN: [id number]    Date: 06/08/23  Time Spent: 2:05 pm - 2: 50 pm : 45 Minutes  Treatment Type: Individual Therapy.  Reported Symptoms: depression and anxiety.   Mental Status Exam: Appearance:  Casual     Behavior: Appropriate  Motor: Normal  Speech/Language:  Clear and Coherent  Affect: Flat  Mood: anxious  Thought process: normal  Thought content:   WNL  Sensory/Perceptual disturbances:   WNL  Orientation: oriented to person, place, time/date, and situation  Attention: Good  Concentration: Good  Memory: WNL  Fund of knowledge:  Good  Insight:   Good  Judgment:  Good  Impulse Control: Good   Risk Assessment: Danger to Self:  No Self-injurious Behavior: No Danger to Others: No Duty to Warn:no Physical Aggression / Violence:No  Access to Firearms a concern: No  Gang Involvement:No   Subjective:   Tammy Kennedy participated in the session, from the office, with the therapist. She reflected on the past two weeks and noted not feeling as accomplished in regards to goals set during previous session of sorting through one box and calling people regarding the flooring. She noted this feeling like a "chore" and noted not being "excited" about doing it. She noted this being more of a boring activity. She noted a lack of "urgency" or "ambition" to get the task completed. She noted in hindsight "this could go on forever" and noted feeling negatively about not following through on the task. We worked on processing her perspective and discussed ways to break the goal into more manageable smaller goals. We worked on identifying ways to manage this. Tammy Kennedy identified barriers to this plan which appear to be anxiety based and not a difficult issue to address. We processed her apprehension and reluctance to employ specific reminders as tools for engagement. Therapist highlighted barriers including  perspective about task and feeling related to the task. She noted her interest in seeing this task as menial and someone else responsibility. She noted the sense of "pressure" in relation to the task. Psycho-education regarding anxiety and self-talk and barriers that can develop in regards to over-planning. We discussed ways to challenge this going forward and set goals that are more accessible. Tammy Kennedy was engaged and motivated during the session and expressed her commitment towards the goals. Therapist praised Tammy Kennedy for her effort and provided supportive therapy. A follow-up was scheduled for continued treatment.   Interventions: CBT, relaxation, BA  Diagnosis:   MDD (major depressive disorder), recurrent, in partial remission (HCC)  Generalized anxiety disorder  Adjustment disorder with mixed anxiety and depressed mood  Psychiatric Treatment: Yes , [name], MD.   Treatment Plan:  Client Abilities/Strengths Tammy Kennedy is intelligent, self-aware, and motivated for change.   Support System: Friends.   Client Treatment Preferences Outpatient therapy.   Client Statement of Needs Tammy Kennedy would like to become more social, reduce procrastination, manage her symptoms, increase motivation to lead to task completion. Additionally, we highlighted reducing rumination, challenging negative thoughts, feelings, and self-talk, creating a routine to support a positive mood, problem-solving, and engagement in enjoyable activities.   Treatment Level Weekly  Symptoms  Anxiety: feeling nervous, difficulty managing worry, worrying about different things, trouble relaxing, and restlessness.    (Status: maintained) Depression: loss of interest, feeling down, lethargy, fluctuating appetite, feeling bad about self, and difficulty concentrating.     (Status: maintained)  Goals:   Tammy Kennedy experiences symptoms of depression and anxiety.  Target Date: 02/08/24 Frequency: Weekly  Progress: 0 Modality:  individual    Therapist will provide referrals for additional resources as appropriate.  Therapist will provide psycho-education regarding Tammy Kennedy's diagnosis and corresponding treatment approaches and interventions. Licensed Clinical Social Worker, [place], LCSW will support the patient's ability to achieve the goals identified. will employ CBT, BA, Problem-solving, Solution Focused, Mindfulness,  coping skills, & other evidenced-based practices will be used to promote progress towards healthy functioning to help manage decrease symptoms associated with her diagnosis.   Reduce overall level, frequency, and intensity of the feelings of depression and anxiety as evidenced by decreased overall symptoms from 6 to 7 days/week to 0 to 1 days/week per client report for at least 3 consecutive months. Verbally express understanding of the relationship between feelings of depression, anxiety and their impact on thinking patterns and behaviors. Verbalize an understanding of the role that distorted thinking plays in creating fears, excessive worry, and ruminations.  Tammy Kennedy participated in the creation of the treatment plan)    Tammy Ovens, LCSW

## 2023-06-10 ENCOUNTER — Ambulatory Visit (INDEPENDENT_AMBULATORY_CARE_PROVIDER_SITE_OTHER)
Admission: RE | Admit: 2023-06-10 | Discharge: 2023-06-10 | Disposition: A | Discharge status: Home / Self Care | Payer: Medicare Other | Source: Ambulatory Visit | Attending: Internal Medicine | Admitting: Internal Medicine

## 2023-06-10 DIAGNOSIS — M810 Age-related osteoporosis without current pathological fracture: Secondary | ICD-10-CM | POA: Diagnosis not present

## 2023-06-22 ENCOUNTER — Ambulatory Visit (INDEPENDENT_AMBULATORY_CARE_PROVIDER_SITE_OTHER): Payer: Medicare Other | Admitting: Psychology

## 2023-06-22 DIAGNOSIS — F411 Generalized anxiety disorder: Secondary | ICD-10-CM | POA: Diagnosis not present

## 2023-06-22 DIAGNOSIS — F3341 Major depressive disorder, recurrent, in partial remission: Secondary | ICD-10-CM

## 2023-06-22 NOTE — Progress Notes (Signed)
[facility] Counselor/Therapist Progress Note  Patient ID: Tammy Kennedy, MRN: [id number]    Date: 06/22/23  Time Spent: 4:02 pm - 4:47 pm : 45 Minutes  Treatment Type: Individual Therapy.  Reported Symptoms: depression and anxiety.   Mental Status Exam: Appearance:  Casual     Behavior: Appropriate  Motor: Normal  Speech/Language:  Clear and Coherent  Affect: Flat  Mood: anxious  Thought process: normal  Thought content:   WNL  Sensory/Perceptual disturbances:   WNL  Orientation: oriented to person, place, time/date, and situation  Attention: Good  Concentration: Good  Memory: WNL  Fund of knowledge:  Good  Insight:   Good  Judgment:  Good  Impulse Control: Good   Risk Assessment: Danger to Self:  No Self-injurious Behavior: No Danger to Others: No Duty to Warn:no Physical Aggression / Violence:No  Access to Firearms a concern: No  Gang Involvement:No   Subjective:   Tammy Kennedy participated in the session, from the office, with the therapist and consented to treatment. She reflected on the past two weeks and noted working on an art exhibit, as an Chief Technology Officer, and noted this going well, overall. She noted some interpersonal stressors but noted "figuring it out". She continues to experience barriers to her goal of sorting her many boxes due to a lack of free space, working on other projects, and low motivation. She noted a lack of motivation to a level to engage in behavior. She noted she is only accountable to her self and she often talks herself out of it. She noted feeling pressure, feeling fearful of getting started, and an elevated heart-rate. We explored this during the session and worked on feeling identification and identified an insight, that once started, she is worried she wont be able to restart again. She noted worry that she will not be able to motivate herself again. We explored this during the session and discussed this being rooted in  anxiety. We worked on identifying short-term goals while managing and reducing long-term anxiety. We discussed the use of timelines and structure, identifying benefit, external motivation/pressure, satisfaction of task complete, and a developing a sense of commitment in lieu of motivation. Therapist and Tammy Kennedy worked on creating a minor goal of sorting through one sheet and highlighting the the positive feelings possibly attached once task is completed. Therapist encouraged Tammy Kennedy to minimize thinking of long-term plans. We worked on identifying possible reinforcers for engagement. Tammy Kennedy will work on identifying possible reinforcers that might be effective while aligning with the goal. Therapist praised Tammy Kennedy for her effort and energy and provided supportive therapy. A follow-up was scheduled for continued treatment.   Interventions: CBT, relaxation, BA  Diagnosis:   MDD (major depressive disorder), recurrent, in partial remission (HCC)  Generalized anxiety disorder  Psychiatric Treatment: Yes , Tammy Cockayne, MD.   Treatment Plan:  Client Abilities/Strengths Tammy Kennedy is intelligent, self-aware, and motivated for change.   Support System: Friends.   Client Treatment Preferences Outpatient therapy.   Client Statement of Needs Tammy Kennedy would like to become more social, reduce procrastination, manage her symptoms, increase motivation to lead to task completion. Additionally, we highlighted reducing rumination, challenging negative thoughts, feelings, and self-talk, creating a routine to support a positive mood, problem-solving, and engagement in enjoyable activities.   Treatment Level Weekly  Symptoms  Anxiety: feeling nervous, difficulty managing worry, worrying about different things, trouble relaxing, and restlessness.    (Status: maintained) Depression: loss of interest, feeling down, lethargy, fluctuating appetite, feeling bad about  self, and difficulty concentrating.     (Status:  maintained)  Goals:   Tammy Kennedy experiences symptoms of depression and anxiety.    Target Date: 02/08/24 Frequency: Weekly  Progress: 0 Modality: individual    Therapist will provide referrals for additional resources as appropriate.  Therapist will provide psycho-education regarding Tammy Kennedy's diagnosis and corresponding treatment approaches and interventions. Licensed Clinical Social Worker, [place], Tammy Kennedy will support the patient's ability to achieve the goals identified. will employ CBT, BA, Problem-solving, Solution Focused, Mindfulness,  coping skills, & other evidenced-based practices will be used to promote progress towards healthy functioning to help manage decrease symptoms associated with her diagnosis.   Reduce overall level, frequency, and intensity of the feelings of depression and anxiety as evidenced by decreased overall symptoms from 6 to 7 days/week to 0 to 1 days/week per client report for at least 3 consecutive months. Verbally express understanding of the relationship between feelings of depression, anxiety and their impact on thinking patterns and behaviors. Verbalize an understanding of the role that distorted thinking plays in creating fears, excessive worry, and ruminations.  Tammy Kennedy participated in the creation of the treatment plan)    Tammy Ovens, Tammy Kennedy

## 2023-06-25 ENCOUNTER — Other Ambulatory Visit: Payer: Self-pay | Admitting: Internal Medicine

## 2023-06-28 ENCOUNTER — Other Ambulatory Visit (INDEPENDENT_AMBULATORY_CARE_PROVIDER_SITE_OTHER): Payer: Medicare Other

## 2023-06-28 DIAGNOSIS — R739 Hyperglycemia, unspecified: Secondary | ICD-10-CM

## 2023-06-28 DIAGNOSIS — M810 Age-related osteoporosis without current pathological fracture: Secondary | ICD-10-CM | POA: Diagnosis not present

## 2023-06-28 DIAGNOSIS — E559 Vitamin D deficiency, unspecified: Secondary | ICD-10-CM

## 2023-06-28 DIAGNOSIS — E785 Hyperlipidemia, unspecified: Secondary | ICD-10-CM

## 2023-06-28 DIAGNOSIS — E038 Other specified hypothyroidism: Secondary | ICD-10-CM | POA: Diagnosis not present

## 2023-06-28 LAB — COMPREHENSIVE METABOLIC PANEL
ALT: 14 U/L (ref 0–35)
AST: 31 U/L (ref 0–37)
Albumin: 4.3 g/dL (ref 3.5–5.2)
Alkaline Phosphatase: 74 U/L (ref 39–117)
BUN: 15 mg/dL (ref 6–23)
CO2: 27 meq/L (ref 19–32)
Calcium: 9.2 mg/dL (ref 8.4–10.5)
Chloride: 105 meq/L (ref 96–112)
Creatinine, Ser: 0.76 mg/dL (ref 0.40–1.20)
GFR: 81.28 mL/min (ref >60.00)
Glucose, Bld: 83 mg/dL (ref 70–99)
Potassium: 3.9 meq/L (ref 3.5–5.1)
Sodium: 143 meq/L (ref 135–145)
Total Bilirubin: 0.5 mg/dL (ref 0.2–1.2)
Total Protein: 7 g/dL (ref 6.0–8.3)

## 2023-06-28 LAB — LIPID PANEL
Cholesterol: 223 mg/dL — ABNORMAL HIGH (ref 0–200)
HDL: 76.1 mg/dL (ref >39.00)
LDL Cholesterol: 97 mg/dL (ref 0–99)
NonHDL: 147.08
Total CHOL/HDL Ratio: 3
Triglycerides: 249 mg/dL — ABNORMAL HIGH (ref 0.0–149.0)
VLDL: 49.8 mg/dL — ABNORMAL HIGH (ref 0.0–40.0)

## 2023-06-28 LAB — HEMOGLOBIN A1C: Hgb A1c MFr Bld: 5.8 % (ref 4.6–6.5)

## 2023-06-28 LAB — TSH: TSH: 0.92 u[IU]/mL (ref 0.35–5.50)

## 2023-06-28 LAB — VITAMIN D 25 HYDROXY (VIT D DEFICIENCY, FRACTURES): VITD: 41.28 ng/mL (ref 30.00–100.00)

## 2023-06-30 DIAGNOSIS — H524 Presbyopia: Secondary | ICD-10-CM | POA: Diagnosis not present

## 2023-07-09 ENCOUNTER — Other Ambulatory Visit: Payer: Self-pay | Admitting: Internal Medicine

## 2023-07-12 ENCOUNTER — Ambulatory Visit (INDEPENDENT_AMBULATORY_CARE_PROVIDER_SITE_OTHER): Payer: Medicare Other | Admitting: Psychology

## 2023-07-12 DIAGNOSIS — F411 Generalized anxiety disorder: Secondary | ICD-10-CM

## 2023-07-12 DIAGNOSIS — F3341 Major depressive disorder, recurrent, in partial remission: Secondary | ICD-10-CM | POA: Diagnosis not present

## 2023-07-12 NOTE — Progress Notes (Signed)
[facility] Counselor/Therapist Progress Note  Patient ID: Tammy Kennedy, MRN: [id number]    Date: 07/12/23  Time Spent: 11:01 am - 11:42 am : 41 Minutes  Treatment Type: Individual Therapy.  Reported Symptoms: depression and anxiety.   Mental Status Exam: Appearance:  Casual     Behavior: Appropriate  Motor: Normal  Speech/Language:  Clear and Coherent  Affect: Flat  Mood: anxious  Thought process: normal  Thought content:   WNL  Sensory/Perceptual disturbances:   WNL  Orientation: oriented to person, place, time/date, and situation  Attention: Good  Concentration: Good  Memory: WNL  Fund of knowledge:  Good  Insight:   Good  Judgment:  Good  Impulse Control: Good   Risk Assessment: Danger to Self:  No Self-injurious Behavior: No Danger to Others: No Duty to Warn:no Physical Aggression / Violence:No  Access to Firearms a concern: No  Gang Involvement:No   Subjective:   Tammy Kennedy participated in the session, from the office, with the therapist and consented to treatment. She reflected on the past two weeks. 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 She noted barriers including not being able to use a specific table and things a specific way. Therapist highlighted possible dichotomous thinking. She endorsed this type thinking even in childhood including not "speaking [place] unless its perfect" or changing college major due to a specific school not having the major she wanted. She noted "if I can't do it the right now". If not perfect, she would note a sense of "embarrassment". She noted a deviation from her plan "wouldn't be the right way". She noted different ways are not "efficient". We worked on processing this and her expectations and working on setting more reasonable expectations. We worked on identifying ways to engage in self-talk, setting reasonable expectations, and working on engagement vs.  Outcome/results oriented. Therapist encouraged Tammy Kennedy to be mindful of the negative self-talk and "reasons" to not engage. We worked on identifying ways challenge this and address these concerns. We worked on identifying ways to Tammy Kennedy. We worked on practicing ways to manage her anxiety, set reasonable expectorations, and challenge negative self-talk. Tammy Kennedy was engaged and motivated during the session. She expressed commitment towards our goals. Therapist praised Tammy Kennedy and provided supportive therapy. A follow-up was scheduled for continued treatment.   Interventions: CBT  Diagnosis:   MDD (major depressive disorder), recurrent, in partial remission (HCC)  Generalized anxiety disorder  Adjustment disorder with mixed anxiety and depressed mood  Psychiatric Treatment: Yes , Tammy Cockayne, MD.   Treatment Plan:  Client Abilities/Strengths Tammy Kennedy is intelligent, self-aware, and motivated for change.   Support System: Friends.   Client Treatment Preferences Outpatient therapy.   Client Statement of Needs Tammy Kennedy would like to become more social, reduce procrastination, manage her symptoms, increase motivation to lead to task completion. Additionally, we highlighted reducing rumination, challenging negative thoughts, feelings, and self-talk, creating a routine to support a positive mood, problem-solving, and engagement in enjoyable activities.   Treatment Level Weekly  Symptoms  Anxiety: feeling nervous, difficulty managing worry, worrying about different things, trouble relaxing, and restlessness.    (Status: maintained) Depression: loss of interest, feeling down, lethargy, fluctuating appetite, feeling bad about self, and difficulty concentrating.     (Status: maintained)  Goals:   Tammy Kennedy experiences symptoms of depression and anxiety.    Target Date: 02/08/24 Frequency: Weekly  Progress: 0 Modality: individual    Therapist will provide referrals for additional  resources as  appropriate.  Therapist will provide psycho-education regarding Tammy Kennedy's diagnosis and corresponding treatment approaches and interventions. Licensed Clinical Social Worker, [place], LCSW will support the patient's ability to achieve the goals identified. will employ CBT, BA, Problem-solving, Solution Focused, Mindfulness,  coping skills, & other evidenced-based practices will be used to promote progress towards healthy functioning to help manage decrease symptoms associated with her diagnosis.   Reduce overall level, frequency, and intensity of the feelings of depression and anxiety as evidenced by decreased overall symptoms from 6 to 7 days/week to 0 to 1 days/week per client report for at least 3 consecutive months. Verbally express understanding of the relationship between feelings of depression, anxiety and their impact on thinking patterns and behaviors. Verbalize an understanding of the role that distorted thinking plays in creating fears, excessive worry, and ruminations.  Tammy Kennedy participated in the creation of the treatment plan)    Tammy Ovens, LCSW

## 2023-07-27 ENCOUNTER — Ambulatory Visit: Payer: Medicare Other | Admitting: Psychology

## 2023-07-27 DIAGNOSIS — F411 Generalized anxiety disorder: Secondary | ICD-10-CM | POA: Diagnosis not present

## 2023-07-27 DIAGNOSIS — F3341 Major depressive disorder, recurrent, in partial remission: Secondary | ICD-10-CM

## 2023-07-27 NOTE — Progress Notes (Signed)
[facility] Counselor/Therapist Progress Note  Patient ID: MAEBY VANKLEECK, MRN: [id number]    Date: 07/27/23  Time Spent: 10:02 am - 10:47 am : 45  Minutes  Treatment Type: Individual Therapy.  Reported Symptoms: depression and anxiety.   Mental Status Exam: Appearance:  Casual     Behavior: Appropriate  Motor: Normal  Speech/Language:  Clear and Coherent  Affect: Flat  Mood: anxious  Thought process: normal  Thought content:   WNL  Sensory/Perceptual disturbances:   WNL  Orientation: oriented to person, place, time/date, and situation  Attention: Good  Concentration: Good  Memory: WNL  Fund of knowledge:  Good  Insight:   Good  Judgment:  Good  Impulse Control: Good   Risk Assessment: Danger to Self:  No Self-injurious Behavior: No Danger to Others: No Duty to Warn:no Physical Aggression / Violence:No  Access to Firearms a concern: No  Gang Involvement:No   Subjective:   [name] participated in the session, from the office, with the therapist and consented to treatment. She reflected on the past two weeks. [name] noted excitement regarding halloween. She noted feeling overwhelmed and disappointed by not following up on the previous goals, specifically clearing boxes. She noted doubts that she could complete the task and using distraction (avoidance) to redirect self. In hindsight "I have let myself down" and that starting could have been a foray into meeting the long-term goal. We discussed screening for ADHD. We completed this during the session and it was a negative screening. We reviewed the results during the session. Therapist provided psycho-education regarding depression, anxiety, and executive functioning. [name] noted feeling "depressed" and "floating through life". She noted wanting to have more excitement, feeling more motivated and excited about the day, and completing tasks. Therapist encouraged [name] to contact her prescriber to discuss  mood, concerns, and needs. Therapist encouraged [name] to reach out to her psychiatric provider which was previously seen in August, 2024. [name] was in agreement to follow-up with her provider and noted her intent to reach out. Therapist praised Jerene for her effort during the session, vulnerability, and disclosure. Therapist validated Joline's feelings and experience and provided supportive therapy. A follow-up was scheduled for continued treatment.     ASRS V1.1  Part-A 1. How often do you have trouble wrapping up the final details of a project,     once the challenging parts have been done? Rarely 2. How often do you have difficulty getting things in order when you have to do     a task that requires organization? Rarely 3. How often do you have problems remembering appointments or obligations? Never 4. When you have a task that requires a lot of thought, how often do you avoid     or delay getting started? Sometimes 5. How often do you fidget or squirm with your hands or feet when you have     to sit down for a long time? Very Often 6. How often do you feel overly active and compelled to do things, like you     were driven by a motor? Never  Part-B 7. How often do you make careless mistakes when you have to work on a boring or     difficult project? Rarely 8. How often do you have difficulty keeping your attention when you are doing boring     or repetitive work? Sometimes 9. How often do you have difficulty concentrating on what people say to you,  even when they are speaking to you directly? Sometimes 10. How often do you misplace or have difficulty finding things at home or at work? Sometimes 11. How often are you distracted by activity or noise around you? Rarely 12. How often do you leave your seat in meetings or other situations in which       you are expected to remain seated? Never 13. How often do you feel restless or fidgety? Often 14. How often do you have difficulty  unwinding and relaxing when you have time       to yourself? Rarely 15. How often do you find yourself talking too much when you are in social situations? Never 16. When you're in a conversation, how often do you find yourself finishing           the sentences of the people you are talking to, before they can finish       them themselves? Rarely 17. How often do you have difficulty waiting your turn in situations when       turn taking is required? Rarely 18. How often do you interrupt others when they are busy? Rarely   Interventions: CBT & psycho-education.   Diagnosis:   MDD (major depressive disorder), recurrent, in partial remission (HCC)  Generalized anxiety disorder  Psychiatric Treatment: Yes , Dr. Pollyann Kennedy.   Treatment Plan:  Client Abilities/Strengths Lynia is intelligent, self-aware, and motivated for change.   Support System: Friends.   Client Treatment Preferences Outpatient therapy.   Client Statement of Needs Tarah would like to become more social, reduce procrastination, manage her symptoms, increase motivation to lead to task completion. Additionally, we highlighted reducing rumination, challenging negative thoughts, feelings, and self-talk, creating a routine to support a positive mood, problem-solving, and engagement in enjoyable activities.   Treatment Level Weekly  Symptoms  Anxiety: feeling nervous, difficulty managing worry, worrying about different things, trouble relaxing, and restlessness.    (Status: maintained) Depression: loss of interest, feeling down, lethargy, fluctuating appetite, feeling bad about self, and difficulty concentrating.     (Status: maintained)  Goals:   Suprena experiences symptoms of depression and anxiety.    Target Date: 02/08/24 Frequency: Weekly  Progress: 0 Modality: individual    Therapist will provide referrals for additional resources as appropriate.  Therapist will provide psycho-education regarding Marriah's  diagnosis and corresponding treatment approaches and interventions. Licensed Clinical Social Worker, [place], LCSW will support the patient's ability to achieve the goals identified. will employ CBT, BA, Problem-solving, Solution Focused, Mindfulness,  coping skills, & other evidenced-based practices will be used to promote progress towards healthy functioning to help manage decrease symptoms associated with her diagnosis.   Reduce overall level, frequency, and intensity of the feelings of depression and anxiety as evidenced by decreased overall symptoms from 6 to 7 days/week to 0 to 1 days/week per client report for at least 3 consecutive months. Verbally express understanding of the relationship between feelings of depression, anxiety and their impact on thinking patterns and behaviors. Verbalize an understanding of the role that distorted thinking plays in creating fears, excessive worry, and ruminations.  [name] participated in the creation of the treatment plan)    [name], LCSW

## 2023-08-06 ENCOUNTER — Encounter: Payer: Self-pay | Admitting: Internal Medicine

## 2023-08-09 ENCOUNTER — Ambulatory Visit: Payer: Medicare Other | Admitting: Psychology

## 2023-08-09 DIAGNOSIS — F3341 Major depressive disorder, recurrent, in partial remission: Secondary | ICD-10-CM

## 2023-08-09 DIAGNOSIS — F411 Generalized anxiety disorder: Secondary | ICD-10-CM

## 2023-08-09 NOTE — Progress Notes (Signed)
[facility] Counselor/Therapist Progress Note  Patient ID: Tammy Kennedy, MRN: [id number]    Date: 08/09/23  Time Spent: 11:01 am -  11: 27 am : 26 Minutes  Treatment Type: Individual Therapy.  Reported Symptoms: depression and anxiety.   Mental Status Exam: Appearance:  Casual     Behavior: Appropriate  Motor: Normal  Speech/Language:  Clear and Coherent  Affect: Flat  Mood: anxious  Thought process: normal  Thought content:   WNL  Sensory/Perceptual disturbances:   WNL  Orientation: oriented to person, place, time/date, and situation  Attention: Good  Concentration: Good  Memory: WNL  Fund of knowledge:  Good  Insight:   Good  Judgment:  Good  Impulse Control: Good   Risk Assessment: Danger to Self:  No Self-injurious Behavior: No Danger to Others: No Duty to Warn:no Physical Aggression / Violence:No  Access to Firearms a concern: No  Gang Involvement:No   Subjective:   [name] participated in the session, from the office, with the therapist and consented to treatment. She reflected on the past two weeks. She noted contacting her PCP to discuss a medication change that might aid in managing her mood better and improve executive functioning and mood. Therapist encouraged [name] to contact her psychiatrist for guidance. She noted continued struggles with getting tasks complete. We discussed her coping, regarding pain, to help manage this including being mindful of movement, taking OTC pain meds, and visiting her doctor. She noted not sleeping well due to the pain. She noted typically feeling down during the winter in relation to mood. She noted the possibility of visiting her brother during the holidays who lives in [place] but is unsure if this will come to fruition. She discussed staying home otherwise. She noted excitement regarding Halloween and decorating. She noted hip pain and is hopeful that its due to weather and noted an injury to her replaced  hip. She noted that she will reach out to her provider should the pain continue. [name] was engaged and motivated during the session. We scheduled a follow-up for continued treatment which [name] benefits from.    Interventions: CBT & psycho-education.   Diagnosis:   MDD (major depressive disorder), recurrent, in partial remission (HCC)  Generalized anxiety disorder  Psychiatric Treatment: Yes , Dr. Pollyann Kennedy.   Treatment Plan:  Client Abilities/Strengths Naydean is intelligent, self-aware, and motivated for change.   Support System: Friends.   Client Treatment Preferences Outpatient therapy.   Client Statement of Needs Salisha would like to become more social, reduce procrastination, manage her symptoms, increase motivation to lead to task completion. Additionally, we highlighted reducing rumination, challenging negative thoughts, feelings, and self-talk, creating a routine to support a positive mood, problem-solving, and engagement in enjoyable activities.   Treatment Level Weekly  Symptoms  Anxiety: feeling nervous, difficulty managing worry, worrying about different things, trouble relaxing, and restlessness.    (Status: maintained) Depression: loss of interest, feeling down, lethargy, fluctuating appetite, feeling bad about self, and difficulty concentrating.     (Status: maintained)  Goals:   Lauraashley experiences symptoms of depression and anxiety.    Target Date: 02/08/24 Frequency: Weekly  Progress: 0 Modality: individual    Therapist will provide referrals for additional resources as appropriate.  Therapist will provide psycho-education regarding Rowena's diagnosis and corresponding treatment approaches and interventions. Licensed Clinical Social Worker, [place], LCSW will support the patient's ability to achieve the goals identified. will employ CBT, BA, Problem-solving, Solution Focused, Mindfulness,  coping skills, & other  evidenced-based practices will be used to  promote progress towards healthy functioning to help manage decrease symptoms associated with her diagnosis.   Reduce overall level, frequency, and intensity of the feelings of depression and anxiety as evidenced by decreased overall symptoms from 6 to 7 days/week to 0 to 1 days/week per client report for at least 3 consecutive months. Verbally express understanding of the relationship between feelings of depression, anxiety and their impact on thinking patterns and behaviors. Verbalize an understanding of the role that distorted thinking plays in creating fears, excessive worry, and ruminations.  [name] participated in the creation of the treatment plan)    [name], LCSW

## 2023-08-16 ENCOUNTER — Other Ambulatory Visit: Payer: Self-pay | Admitting: Internal Medicine

## 2023-08-23 ENCOUNTER — Other Ambulatory Visit: Payer: Self-pay | Admitting: Internal Medicine

## 2023-08-30 ENCOUNTER — Ambulatory Visit: Payer: Medicare Other | Admitting: Psychology

## 2023-08-30 DIAGNOSIS — F411 Generalized anxiety disorder: Secondary | ICD-10-CM

## 2023-08-30 DIAGNOSIS — F3341 Major depressive disorder, recurrent, in partial remission: Secondary | ICD-10-CM

## 2023-08-30 NOTE — Progress Notes (Signed)
[facility] Counselor/Therapist Progress Note  Patient ID: Tammy Kennedy, MRN: [id number]    Date: 08/30/23  Time Spent: 1:37 pm -  2:10  pm : 33 Minutes  Treatment Type: Individual Therapy.  Reported Symptoms: depression and anxiety.   Mental Status Exam: Appearance:  Casual     Behavior: Appropriate  Motor: Normal  Speech/Language:  Clear and Coherent  Affect: Flat  Mood: anxious  Thought process: normal  Thought content:   WNL  Sensory/Perceptual disturbances:   WNL  Orientation: oriented to person, place, time/date, and situation  Attention: Good  Concentration: Good  Memory: WNL  Fund of knowledge:  Good  Insight:   Good  Judgment:  Good  Impulse Control: Good   Risk Assessment: Danger to Self:  No Self-injurious Behavior: No Danger to Others: No Duty to Warn:no Physical Aggression / Violence:No  Access to Firearms a concern: No  Gang Involvement:No   Subjective:   [name] participated in the session, from the office, with the therapist and consented to treatment. She reflected on the past two weeks. [name] noted reaching to her PCP who directed to her psychiatric provider to discuss a medication change. She noted her cat's passing that was expect but painful. She noted having her cat for 10 years. She reflected on her feelings of grief and noted feeling loneliness. She noted hat her roommate works and spends a lot of time at with her significant other away from the home. She noted working busy and staying social. She noted difficulty going to sleep but denied any middle insomnia. She noted "I think I eat okay". She noted barriers to sleep include thinking about her cat, partner, and brother. She noted often thinking about tasks that need to be complete. She noted playing alphabet games. We discussed additional ways to manage this nighttime rumination including creating a list. We reviewed tools such as relaxation, breathing exercises, and [name]  was open to employing these going forward. She noted that she will contact her psychiatric provider to discuss a medication change and then work on scheduling a follow-up, with the therapist, once she has changed the medication. Therapist encouraged [name] to contact the therapist prior to this time should the need arise. [name] was receptive to this. [name] has the office's contact information.   Interventions: CBT & psycho-education.   Diagnosis:   MDD (major depressive disorder), recurrent, in partial remission (HCC)  Generalized anxiety disorder  Psychiatric Treatment: Yes , Dr. Pollyann Kennedy.   Treatment Plan:  Client Abilities/Strengths Dawson is intelligent, self-aware, and motivated for change.   Support System: Friends.   Client Treatment Preferences Outpatient therapy.   Client Statement of Needs Lanasia would like to become more social, reduce procrastination, manage her symptoms, increase motivation to lead to task completion. Additionally, we highlighted reducing rumination, challenging negative thoughts, feelings, and self-talk, creating a routine to support a positive mood, problem-solving, and engagement in enjoyable activities.   Treatment Level Weekly  Symptoms  Anxiety: feeling nervous, difficulty managing worry, worrying about different things, trouble relaxing, and restlessness.    (Status: maintained) Depression: loss of interest, feeling down, lethargy, fluctuating appetite, feeling bad about self, and difficulty concentrating.     (Status: maintained)  Goals:   Detta experiences symptoms of depression and anxiety.    Target Date: 02/08/24 Frequency: Weekly  Progress: 0 Modality: individual    Therapist will provide referrals for additional resources as appropriate.  Therapist will provide psycho-education regarding Ange's diagnosis and corresponding treatment approaches  and interventions. Licensed Clinical Social Worker, [place], LCSW will support the  patient's ability to achieve the goals identified. will employ CBT, BA, Problem-solving, Solution Focused, Mindfulness,  coping skills, & other evidenced-based practices will be used to promote progress towards healthy functioning to help manage decrease symptoms associated with her diagnosis.   Reduce overall level, frequency, and intensity of the feelings of depression and anxiety as evidenced by decreased overall symptoms from 6 to 7 days/week to 0 to 1 days/week per client report for at least 3 consecutive months. Verbally express understanding of the relationship between feelings of depression, anxiety and their impact on thinking patterns and behaviors. Verbalize an understanding of the role that distorted thinking plays in creating fears, excessive worry, and ruminations.  [name] participated in the creation of the treatment plan)    [name], LCSW

## 2023-09-06 ENCOUNTER — Other Ambulatory Visit: Payer: Self-pay | Admitting: Internal Medicine

## 2023-10-04 ENCOUNTER — Other Ambulatory Visit: Payer: Self-pay | Admitting: Internal Medicine

## 2023-11-01 ENCOUNTER — Ambulatory Visit (HOSPITAL_COMMUNITY): Payer: Medicare Other | Admitting: Student

## 2023-11-01 VITALS — BP 124/86 | HR 78 | Ht 59.0 in | Wt 124.0 lb

## 2023-11-01 DIAGNOSIS — F411 Generalized anxiety disorder: Secondary | ICD-10-CM | POA: Diagnosis not present

## 2023-11-01 DIAGNOSIS — F3341 Major depressive disorder, recurrent, in partial remission: Secondary | ICD-10-CM | POA: Diagnosis not present

## 2023-11-01 MED ORDER — MIRTAZAPINE 7.5 MG PO TABS: ORAL_TABLET | ORAL | 2 refills | Status: DC

## 2023-11-09 NOTE — Addendum Note (Signed)
Addended by: Tia Masker on: 11/09/2023 10:14 AM   Modules accepted: Level of Service

## 2023-11-09 NOTE — Progress Notes (Signed)
BH MD Outpatient Progress Note  Date of visit: 11/01/2023 Tammy Kennedy  MRN:  [id number]  Assessment:  Tammy Kennedy presents for follow-up evaluation.  At the last visit, adjunctive Abilify was decreased from 5 mg to 2 mg daily to help with potential side effects.  Patient reports that she feels less dizziness and daytime tiredness with the decreased dose.  Unfortunately the patient reports that her depressed mood has worsened recently.  We discussed discontinuation of Abilify and switching to a new adjunctive treatment, Remeron.  The patient was amenable to this plan.  Identifying Information: Tammy Kennedy is a 68 y.o. y.o. female with a history of MDD, GAD who is an established patient with [facility] for management of depression and anxiety.   Plan:  # Major depressive disorder in partial remission, generalized anxiety disorder Interventions: -- Continue Effexor 225 mg daily - Discontinue Abilify 2 mg daily - Start Remeron 7.5 mg nightly for 7 days, then increase to 15 mg nightly - Continue therapy with [name] LCSW   # Hypothyroidism Interventions: -- Last TSH wnl  Patient was given contact information for behavioral health clinic and was instructed to call 911 for emergencies.   Subjective:  Chief Complaint:  Chief Complaint  Patient presents with   Follow-up    Interval History:  Today the patient reports that her mood has been worse over the past several weeks.  She says "I feel lonely and like I have nothing to do".  She reports experiencing poor motivation and anhedonia.  She reports getting approximately 8 hours of sleep but does not feel rested in the morning.  She says that she spends much of her time watching TV or cross stitching.  She does report going on outings with friends at art shows.  She denies experiencing any suicidal thoughts.   Visit Diagnosis:    ICD-10-CM   1. MDD (major depressive disorder), recurrent, in  partial remission (HCC)  F33.41 mirtazapine (REMERON) 7.5 MG tablet    2. Generalized anxiety disorder  F41.1 mirtazapine (REMERON) 7.5 MG tablet      Past Psychiatric History:  The patient reports a psychiatric hospitalization in 2006 for issues with alcohol.  She does not feel it was necessary.  She denies ever attempting suicide or engaging in self-injurious behavior.   Past Medical History:  Past Medical History:  Diagnosis Date   Alcohol abuse 05/14/2016   ALCOHOL ABUSE, HX OF    ANEMIA    ANXIETY DISORDER    Basal cell carcinoma (BCC) 01/10/2018   C. difficile colitis    CROHN'S DISEASE    DEPRESSION    DRY EYE SYNDROME    HEPATOMEGALY, HX OF    HYPERLIPIDEMIA    intol of statin trials   HYPOTHYROIDISM    OSTEOPENIA    Osteoporosis 05/2017   states Pre osteoporosis 8/18   Rectovaginal fistula 2012   chron's disease   Tubular adenoma 2013    Past Surgical History:  Procedure Laterality Date   ANAL FISSURE REPAIR     CATARACT EXTRACTION     EYE SURGERY  11/2017   macular pucker   HEMORRHOID SURGERY     IR RADIOLOGIST EVAL & MGMT  08/05/2021   Left thumb surgery  1989   MOHS SURGERY  2008   Nose BCC   Theraputic abortion     TOTAL HIP ARTHROPLASTY Right    09/2021    Family Psychiatric History: None pertinent  Family History:  Family History  Adopted: Yes  Problem Relation Age of Onset   Breast cancer Neg Hx     Social History:  Social History   Socioeconomic History   Marital status: Divorced    Spouse name: Not on file   Number of children: Not on file   Years of education: Not on file   Highest education level: Not on file  Occupational History   Occupation: Retider  Tobacco Use   Smoking status: Never   Smokeless tobacco: Never  Vaping Use   Vaping status: Never Used  Substance and Sexual Activity   Alcohol use: Yes    Comment: has drank excessively, not drinking daily   Drug use: No   Sexual activity: Not on file  Other Topics Concern    Not on file  Social History Narrative   Works at [employer]- part time   Social Drivers of Corporate investment banker Strain: Low Risk  (05/06/2023)   Overall Financial Resource Strain (CARDIA)    Difficulty of Paying Living Expenses: Not very hard  Food Insecurity: No Food Insecurity (05/06/2023)   Hunger Vital Sign    Worried About Running Out of Food in the Last Year: Never true    Ran Out of Food in the Last Year: Never true  Transportation Needs: No Transportation Needs (05/06/2023)   PRAPARE - Administrator, Civil Service (Medical): No    Lack of Transportation (Non-Medical): No  Physical Activity: Inactive (05/06/2023)   Exercise Vital Sign    Days of Exercise per Week: 0 days    Minutes of Exercise per Session: 0 min  Stress: No Stress Concern Present (05/06/2023)   [employer] of Occupational Health - Occupational Stress Questionnaire    Feeling of Stress : Not at all  Social Connections: Moderately Isolated (05/06/2023)   Social Connection and Isolation Panel [NHANES]    Frequency of Communication with Friends and Family: Three times a week    Frequency of Social Gatherings with Friends and Family: Once a week    Attends Religious Services: Never    Database administrator or Organizations: Yes    Attends Engineer, structural: 1 to 4 times per year    Marital Status: Divorced    Allergies: No Known Allergies  Current Medications: Current Outpatient Medications  Medication Sig Dispense Refill   mirtazapine (REMERON) 7.5 MG tablet Take 1 tablet (7.5 mg total) by mouth at bedtime for 7 days, THEN 2 tablets (15 mg total) at bedtime. 60 tablet 2   ARIPiprazole (ABILIFY) 2 MG tablet Take 1 tablet (2 mg total) by mouth daily. 90 tablet 1   balsalazide (COLAZAL) 750 MG capsule TAKE 2 CAPSULES(1500 MG) BY MOUTH THREE TIMES DAILY 210 capsule 5   cholecalciferol (VITAMIN D3) 25 MCG (1000 UNIT) tablet Take 1,000 Units by  mouth daily.     folic acid (FOLVITE) 1 MG tablet Take 2 mg by mouth daily.     levothyroxine (SYNTHROID) 50 MCG tablet Take 1 tablet (50 mcg total) by mouth daily. 90 tablet 1   Multiple Vitamins-Minerals (CENTRUM SILVER 50+WOMEN) TABS Take by mouth.     Omega 3 1200 MG CAPS Take 1 capsule (1,200 mg total) by mouth daily.     pantoprazole (PROTONIX) 40 MG tablet TAKE 1 TABLET BY MOUTH TWICE DAILY FOR 1 TO 2 WEEKS THEN DECREASE TO ONCE DAILY. TAKE 30 MINUTES PRIOR TO A MEAL 60 tablet 3   Potassium Gluconate  550 MG TABS Take 2 tablets by mouth daily.      rosuvastatin (CRESTOR) 5 MG tablet TAKE 1 TABLET(5 MG) BY MOUTH DAILY 90 tablet 3   Sennosides (EX-LAX PO) Take by mouth as directed.     valACYclovir (VALTREX) 500 MG tablet TAKE 1 TABLET(500 MG) BY MOUTH DAILY 90 tablet 1   venlafaxine XR (EFFEXOR-XR) 150 MG 24 hr capsule TAKE 1 CAPSULE BY MOUTH EVERY MORNING WITH BREAKFAST 90 capsule 1   venlafaxine XR (EFFEXOR-XR) 75 MG 24 hr capsule TAKE 1 CAPSULE BY MOUTH DAILY WITH BREAKFAST IN ADDITION TO 150 MG DAILY 90 capsule 0   No current facility-administered medications for this visit.     Objective:  Psychiatric Specialty Exam: Physical Exam Constitutional:      Appearance: the patient is not toxic-appearing.  Pulmonary:     Effort: Pulmonary effort is normal.  Neurological:     General: No focal deficit present.     Mental Status: the patient is alert and oriented to person, place, and time.   Review of Systems  Respiratory:  Negative for shortness of breath.   Cardiovascular:  Negative for chest pain.  Gastrointestinal:  Negative for abdominal pain, constipation, diarrhea, nausea and vomiting.  Neurological:  Negative for headaches.      BP 124/86   Pulse 78   Ht 4\' 11"  (1.499 m)   Wt 124 lb (56.2 kg)   LMP 10/12/2005 (LMP Unknown)   BMI 25.04 kg/m   General Appearance: Fairly Groomed  Eye Contact:  Good  Speech:  Clear and Coherent  Volume:  Normal  Mood: Depressed   Affect:  Congruent  Thought Process:  Coherent  Orientation:  Full (Time, Place, and Person)  Thought Content: Logical   Suicidal Thoughts:  No  Homicidal Thoughts:  No  Memory:  Immediate;   Good  Judgement:  fair  Insight:  fair  Psychomotor Activity:  Normal  Concentration:  Concentration: Good  Recall:  Good  Fund of Knowledge: Good  Language: Good  Akathisia:  No  Handed:    AIMS (if indicated): not done  Assets:  Communication Skills Desire for Improvement Financial Resources/Insurance Housing Leisure Time Physical Health  ADL's:  Intact  Cognition: WNL  Sleep:  Fair     Metabolic Disorder Labs: Lab Results  Component Value Date   HGBA1C 5.8 06/28/2023   MPG 108 06/05/2020   No results found for: "PROLACTIN" Lab Results  Component Value Date   CHOL 223 (H) 06/28/2023   TRIG 249.0 (H) 06/28/2023   HDL 76.10 06/28/2023   CHOLHDL 3 06/28/2023   VLDL 49.8 (H) 06/28/2023   LDLCALC 97 06/28/2023   LDLCALC 108 (H) 06/05/2020   Lab Results  Component Value Date   TSH 0.92 06/28/2023   TSH 0.18 (L) 05/19/2023    Therapeutic Level Labs: No results found for: "LITHIUM" No results found for: "VALPROATE" No results found for: "CBMZ"  Screenings: GAD-7    Flowsheet Row Counselor from 01/26/2023 in [facility] at [employer] [facility] Office Visit from 1/61/0960 in [facility]  Total GAD-7 Score 8 11      PHQ2-9    Flowsheet Row Office Visit from 06/04/2023 in [facility] [place] Clinical Support from 05/06/2023 in [facility] [place] Counselor from 01/26/2023 in [facility] at [employer] [facility] Office Visit from 4/54/0981 in [facility]  Visit from 06/02/2022 in [facility] at [facility]  PHQ-2 Total Score 2 2 4  0 0  PHQ-9 Total Score 3 4 8  -- 2        Collaboration of Care: none  A total of 30 minutes was spent involved in face to face clinical care, chart review, documentation.   [name], MD 11/09/2023, 9:49 AM

## 2023-11-14 ENCOUNTER — Other Ambulatory Visit: Payer: Self-pay | Admitting: Gastroenterology

## 2023-11-18 ENCOUNTER — Other Ambulatory Visit: Payer: Self-pay | Admitting: Internal Medicine

## 2023-12-05 ENCOUNTER — Encounter: Payer: Self-pay | Admitting: Internal Medicine

## 2023-12-05 NOTE — Progress Notes (Unsigned)
 Subjective:    Patient ID: Tammy Kennedy, female    DOB: [birth year], 68 y.o.   MRN: [id number]      HPI Tammy Kennedy is here for a Physical exam and her chronic medical problems.   ? On effexor  Op - boniva in past - sideeffects - try fos, recalst or porlia   Medications and allergies reviewed with patient and updated if appropriate.  Current Outpatient Medications on File Prior to Visit  Medication Sig Dispense Refill   balsalazide (COLAZAL) 750 MG capsule TAKE 2 CAPSULES(1500 MG) BY MOUTH THREE TIMES DAILY 210 capsule 5   cholecalciferol (VITAMIN D3) 25 MCG (1000 UNIT) tablet Take 1,000 Units by mouth daily.     folic acid (FOLVITE) 1 MG tablet Take 2 mg by mouth daily.     levothyroxine (SYNTHROID) 50 MCG tablet TAKE 1 TABLET(50 MCG) BY MOUTH DAILY 90 tablet 1   mirtazapine (REMERON) 7.5 MG tablet Take 1 tablet (7.5 mg total) by mouth at bedtime for 7 days, THEN 2 tablets (15 mg total) at bedtime. 60 tablet 2   Multiple Vitamins-Minerals (CENTRUM SILVER 50+WOMEN) TABS Take by mouth.     Omega 3 1200 MG CAPS Take 1 capsule (1,200 mg total) by mouth daily.     pantoprazole (PROTONIX) 40 MG tablet TAKE 1 TABLET BY MOUTH TWICE DAILY FOR 1 TO 2 WEEKS THEN DECREASE TO ONCE DAILY. TAKE 30 MINUTES PRIOR TO A MEAL 60 tablet 3   Potassium Gluconate 550 MG TABS Take 2 tablets by mouth daily.      rosuvastatin (CRESTOR) 5 MG tablet TAKE 1 TABLET(5 MG) BY MOUTH DAILY 90 tablet 3   Sennosides (EX-LAX PO) Take by mouth as directed.     valACYclovir (VALTREX) 500 MG tablet TAKE 1 TABLET(500 MG) BY MOUTH DAILY 90 tablet 1   venlafaxine XR (EFFEXOR-XR) 150 MG 24 hr capsule TAKE 1 CAPSULE BY MOUTH EVERY MORNING WITH BREAKFAST 90 capsule 1   venlafaxine XR (EFFEXOR-XR) 75 MG 24 hr capsule TAKE 1 CAPSULE BY MOUTH DAILY WITH BREAKFAST IN ADDITION TO 150 MG DAILY 90 capsule 0   No current facility-administered medications on file prior to visit.    Review of Systems     Objective:  There were  no vitals filed for this visit. There were no vitals filed for this visit. There is no height or weight on file to calculate BMI.  BP Readings from Last 3 Encounters:  06/04/23 104/68  05/04/23 (!) 92/55  02/19/23 100/60    Wt Readings from Last 3 Encounters:  06/04/23 125 lb 6.4 oz (56.9 kg)  05/06/23 124 lb 3.2 oz (56.3 kg)  05/04/23 121 lb (54.9 kg)       Physical Exam Constitutional: She appears well-developed and well-nourished. No distress.  HENT:  Head: Normocephalic and atraumatic.  Right Ear: External ear normal. Normal ear canal and TM Left Ear: External ear normal.  Normal ear canal and TM Mouth/Throat: Oropharynx is clear and moist.  Eyes: Conjunctivae normal.  Neck: Neck supple. No tracheal deviation present. No thyromegaly present.  No carotid bruit  Cardiovascular: Normal rate, regular rhythm and normal heart sounds.   No murmur heard.  No edema. Pulmonary/Chest: Effort normal and breath sounds normal. No respiratory distress. She has no wheezes. She has no rales.  Breast: deferred   Abdominal: Soft. She exhibits no distension. There is no tenderness.  Lymphadenopathy: She has no cervical adenopathy.  Skin: Skin is warm and dry. She is not  diaphoretic.  Psychiatric: She has a normal mood and affect. Her behavior is normal.     Lab Results  Component Value Date   WBC 5.9 12/04/2022   HGB 15.1 (H) 12/04/2022   HCT 45.5 12/04/2022   PLT 271.0 12/04/2022   GLUCOSE 83 06/28/2023   CHOL 223 (H) 06/28/2023   TRIG 249.0 (H) 06/28/2023   HDL 76.10 06/28/2023   LDLDIRECT 106.0 12/04/2022   LDLCALC 97 06/28/2023   ALT 14 06/28/2023   AST 31 06/28/2023   NA 143 06/28/2023   K 3.9 06/28/2023   CL 105 06/28/2023   CREATININE 0.76 06/28/2023   BUN 15 06/28/2023   CO2 27 06/28/2023   TSH 0.92 06/28/2023   INR 1.0 09/23/2021   HGBA1C 5.8 06/28/2023         Assessment & Plan:   Physical exam: Screening blood work  ordered Exercise   Weight    Substance abuse  none   Reviewed recommended immunizations.   Health Maintenance  Topic Date Due   Zoster Vaccines- Shingrix (1 of 2) Never done   COVID-19 Vaccine (5 - 2024-25 season) 06/13/2023   INFLUENZA VACCINE  01/10/2024 (Originally 05/13/2023)   Medicare Annual Wellness (AWV)  05/05/2024   MAMMOGRAM  05/18/2025   DEXA SCAN  06/09/2025   Colonoscopy  05/03/2026   DTaP/Tdap/Td (3 - Td or Tdap) 06/04/2029   Pneumonia Vaccine 24+ Years old  Completed   Hepatitis C Screening  Completed   HPV VACCINES  Aged Out          See Problem List for Assessment and Plan of chronic medical problems.

## 2023-12-05 NOTE — Patient Instructions (Addendum)
      Blood work was ordered.       Medications changes include :   None    A referral was ordered and someone will call you to schedule an appointment.     Return in about 6 months (around 06/06/2024) for Physical Exam.

## 2023-12-08 ENCOUNTER — Ambulatory Visit (INDEPENDENT_AMBULATORY_CARE_PROVIDER_SITE_OTHER): Payer: Medicare Other | Admitting: Internal Medicine

## 2023-12-08 ENCOUNTER — Encounter: Payer: Self-pay | Admitting: Internal Medicine

## 2023-12-08 VITALS — BP 122/70 | HR 76 | Temp 97.3°F | Ht 59.0 in | Wt 125.8 lb

## 2023-12-08 DIAGNOSIS — Z0000 Encounter for general adult medical examination without abnormal findings: Secondary | ICD-10-CM | POA: Diagnosis not present

## 2023-12-08 DIAGNOSIS — R739 Hyperglycemia, unspecified: Secondary | ICD-10-CM

## 2023-12-08 DIAGNOSIS — E559 Vitamin D deficiency, unspecified: Secondary | ICD-10-CM

## 2023-12-08 DIAGNOSIS — E038 Other specified hypothyroidism: Secondary | ICD-10-CM

## 2023-12-08 DIAGNOSIS — K219 Gastro-esophageal reflux disease without esophagitis: Secondary | ICD-10-CM

## 2023-12-08 DIAGNOSIS — R7303 Prediabetes: Secondary | ICD-10-CM

## 2023-12-08 DIAGNOSIS — A0472 Enterocolitis due to Clostridium difficile, not specified as recurrent: Secondary | ICD-10-CM

## 2023-12-08 DIAGNOSIS — E785 Hyperlipidemia, unspecified: Secondary | ICD-10-CM

## 2023-12-08 DIAGNOSIS — F1011 Alcohol abuse, in remission: Secondary | ICD-10-CM

## 2023-12-08 DIAGNOSIS — M810 Age-related osteoporosis without current pathological fracture: Secondary | ICD-10-CM

## 2023-12-08 DIAGNOSIS — F3341 Major depressive disorder, recurrent, in partial remission: Secondary | ICD-10-CM

## 2023-12-08 LAB — CBC WITH DIFFERENTIAL/PLATELET
Basophils Absolute: 0.1 10*3/uL (ref 0.0–0.1)
Basophils Relative: 1.3 % (ref 0.0–3.0)
Eosinophils Absolute: 0.1 10*3/uL (ref 0.0–0.7)
Eosinophils Relative: 2.5 % (ref 0.0–5.0)
HCT: 43.9 % (ref 36.0–46.0)
Hemoglobin: 14.5 g/dL (ref 12.0–15.0)
Lymphocytes Relative: 25.9 % (ref 12.0–46.0)
Lymphs Abs: 1.3 10*3/uL (ref 0.7–4.0)
MCHC: 33 g/dL (ref 30.0–36.0)
MCV: 96.3 fL (ref 78.0–100.0)
Monocytes Absolute: 0.4 10*3/uL (ref 0.1–1.0)
Monocytes Relative: 8.2 % (ref 3.0–12.0)
Neutro Abs: 3 10*3/uL (ref 1.4–7.7)
Neutrophils Relative %: 62.1 % (ref 43.0–77.0)
Platelets: 241 10*3/uL (ref 150.0–400.0)
RBC: 4.56 Mil/uL (ref 3.87–5.11)
RDW: 13.1 % (ref 11.5–15.5)
WBC: 4.9 10*3/uL (ref 4.0–10.5)

## 2023-12-08 LAB — LIPID PANEL
Cholesterol: 224 mg/dL — ABNORMAL HIGH (ref 0–200)
HDL: 67.5 mg/dL (ref >39.00)
LDL Cholesterol: 118 mg/dL — ABNORMAL HIGH (ref 0–99)
NonHDL: 156.81
Total CHOL/HDL Ratio: 3
Triglycerides: 194 mg/dL — ABNORMAL HIGH (ref 0.0–149.0)
VLDL: 38.8 mg/dL (ref 0.0–40.0)

## 2023-12-08 LAB — COMPREHENSIVE METABOLIC PANEL
ALT: 13 U/L (ref 0–35)
AST: 29 U/L (ref 0–37)
Albumin: 4.3 g/dL (ref 3.5–5.2)
Alkaline Phosphatase: 72 U/L (ref 39–117)
BUN: 20 mg/dL (ref 6–23)
CO2: 28 meq/L (ref 19–32)
Calcium: 9.5 mg/dL (ref 8.4–10.5)
Chloride: 104 meq/L (ref 96–112)
Creatinine, Ser: 0.73 mg/dL (ref 0.40–1.20)
GFR: 85.04 mL/min (ref >60.00)
Glucose, Bld: 88 mg/dL (ref 70–99)
Potassium: 4.3 meq/L (ref 3.5–5.1)
Sodium: 141 meq/L (ref 135–145)
Total Bilirubin: 0.4 mg/dL (ref 0.2–1.2)
Total Protein: 7.1 g/dL (ref 6.0–8.3)

## 2023-12-08 LAB — VITAMIN D 25 HYDROXY (VIT D DEFICIENCY, FRACTURES): VITD: 39.03 ng/mL (ref 30.00–100.00)

## 2023-12-08 LAB — HEMOGLOBIN A1C: Hgb A1c MFr Bld: 6 % (ref 4.6–6.5)

## 2023-12-08 LAB — TSH: TSH: 4.45 u[IU]/mL (ref 0.35–5.50)

## 2023-12-08 NOTE — Assessment & Plan Note (Addendum)
 Chronic DEXA up to date Has been on Boniva in the past and had side effects Not exercising - stressed regular exercise Continue calcium and vitamin D supplementation Check vitamin D level Discussed medication and that she should consider taking something for her bones-at this point she would like to hold off on medication Discussed increased risk of fracture

## 2023-12-08 NOTE — Assessment & Plan Note (Signed)
 Chronic GERD controlled Continue pantoprazole 40 mg 1-2 times a day

## 2023-12-08 NOTE — Assessment & Plan Note (Addendum)
 Chronic seeing psychiatry doing therapy Currently having medications adjusted On Effexor 225 mg daily Just started on Remeron which has been increased

## 2023-12-08 NOTE — Assessment & Plan Note (Signed)
 Chronic Taking vitamin D daily Check vitamin D level

## 2023-12-08 NOTE — Assessment & Plan Note (Signed)
 Chronic Regular exercise and healthy diet encouraged Check lipid panel,tsh, cmp Continue Crestor 5 mg daily

## 2023-12-08 NOTE — Assessment & Plan Note (Signed)
 History of alcohol abuse Currently only drinking socially

## 2023-12-08 NOTE — Assessment & Plan Note (Signed)
 Chronic Lab Results  Component Value Date   HGBA1C 5.8 06/28/2023   Check a1c Low sugar / carb diet Stressed regular exercise

## 2023-12-08 NOTE — Assessment & Plan Note (Signed)
 Chronic  Lab Results  Component Value Date   TSH 0.92 06/28/2023   Check tsh and will titrate med dose if needed Currently taking levothyroxine 50 mcg daily

## 2023-12-09 ENCOUNTER — Encounter: Payer: Self-pay | Admitting: Internal Medicine

## 2023-12-13 ENCOUNTER — Ambulatory Visit (HOSPITAL_BASED_OUTPATIENT_CLINIC_OR_DEPARTMENT_OTHER): Payer: Medicare Other | Admitting: Student

## 2023-12-13 DIAGNOSIS — F411 Generalized anxiety disorder: Secondary | ICD-10-CM | POA: Diagnosis not present

## 2023-12-13 DIAGNOSIS — F3341 Major depressive disorder, recurrent, in partial remission: Secondary | ICD-10-CM

## 2023-12-13 MED ORDER — MIRTAZAPINE 7.5 MG PO TABS: 22.5000 mg | ORAL_TABLET | Freq: Every day | ORAL | 2 refills | Status: DC

## 2023-12-13 NOTE — Progress Notes (Signed)
 BH MD Outpatient Progress Note  Date of visit: 12/13/2023 Tammy Kennedy  MRN:  [id number]  Assessment:  Tammy Kennedy presents for follow-up evaluation.  The patient reports partial improvement in her energy and mood with the addition of Remeron.  Plan to increase this medication as described below.  She does note "my fingers twitching, especially at night" since starting the medication.  We will continue to monitor this potential side effect.  We discussed the patient starting therapy with another practitioner, because she feels she did not have much success with her previous therapist.  Identifying Information: Tammy Kennedy is a 69 y.o. y.o. female with a history of MDD, GAD who is an established patient with [facility] for management of depression and anxiety.   Plan:  # Major depressive disorder in partial remission, generalized anxiety disorder Interventions: -- Continue Effexor 225 mg daily -Increase Remeron from 15 mg nightly to 22.5 mg nightly - Patient scheduled for therapy with Brayton Caves   # Hypothyroidism Interventions: -- Last TSH wnl  Patient was given contact information for behavioral health clinic and was instructed to call 911 for emergencies.   Subjective:  Chief Complaint:  Chief Complaint  Patient presents with   Follow-up    Interval History:  Today the patient reports that her previously pervasively depressed mood has improved somewhat, having several good days each week.  She reports an increased level of energy.  The patient feels that she is "sleeping more deeply".  She feels more rested in the morning.  The patient reports that her level of anxiety is the same, frequently triggered by interpersonal interactions and leaving the house.  The patient reports continuing to spend much of her day inside watching TV or cross stitching.  He denies experiencing any suicidal thoughts.  She does note an increase in her appetite since  starting Remeron.   Visit Diagnosis:    ICD-10-CM   1. MDD (major depressive disorder), recurrent, in partial remission (HCC)  F33.41 mirtazapine (REMERON) 7.5 MG tablet    2. Generalized anxiety disorder  F41.1 mirtazapine (REMERON) 7.5 MG tablet      Past Psychiatric History:  The patient reports a psychiatric hospitalization in 2006 for issues with alcohol.  She does not feel it was necessary.  She denies ever attempting suicide or engaging in self-injurious behavior.   Past Medical History:  Past Medical History:  Diagnosis Date   Alcohol abuse 05/14/2016   ALCOHOL ABUSE, HX OF    ANEMIA    ANXIETY DISORDER    Basal cell carcinoma (BCC) 01/10/2018   C. difficile colitis    CROHN'S DISEASE    DEPRESSION    DRY EYE SYNDROME    HEPATOMEGALY, HX OF    HYPERLIPIDEMIA    intol of statin trials   HYPOTHYROIDISM    OSTEOPENIA    Osteoporosis 05/2017   states Pre osteoporosis 8/18   Rectovaginal fistula 2012   chron's disease   Tubular adenoma 2013    Past Surgical History:  Procedure Laterality Date   ANAL FISSURE REPAIR     CATARACT EXTRACTION     EYE SURGERY  11/2017   macular pucker   HEMORRHOID SURGERY     IR RADIOLOGIST EVAL & MGMT  08/05/2021   Left thumb surgery  1989   MOHS SURGERY  2008   Nose BCC   Theraputic abortion     TOTAL HIP ARTHROPLASTY Right    09/2021    Family Psychiatric History:  None pertinent  Family History:  Family History  Adopted: Yes  Problem Relation Age of Onset   Breast cancer Neg Hx     Social History:  Social History   Socioeconomic History   Marital status: Divorced    Spouse name: Not on file   Number of children: Not on file   Years of education: Not on file   Highest education level: Not on file  Occupational History   Occupation: [employer]  Tobacco Use   Smoking status: Never    Passive exposure: Never   Smokeless tobacco: Never  Vaping Use   Vaping status: Never Used  Substance and Sexual Activity   Alcohol  use: Yes    Comment: has drank excessively, not drinking daily   Drug use: No   Sexual activity: Not on file  Other Topics Concern   Not on file  Social History Narrative   Works at [employer]- part time   Social Drivers of Corporate investment banker Strain: Low Risk  (05/06/2023)   Overall Financial Resource Strain (CARDIA)    Difficulty of Paying Living Expenses: Not very hard  Food Insecurity: No Food Insecurity (05/06/2023)   Hunger Vital Sign    Worried About Running Out of Food in the Last Year: Never true    Ran Out of Food in the Last Year: Never true  Transportation Needs: No Transportation Needs (05/06/2023)   PRAPARE - Administrator, Civil Service (Medical): No    Lack of Transportation (Non-Medical): No  Physical Activity: Inactive (05/06/2023)   Exercise Vital Sign    Days of Exercise per Week: 0 days    Minutes of Exercise per Session: 0 min  Stress: No Stress Concern Present (05/06/2023)   [employer] of Occupational Health - Occupational Stress Questionnaire    Feeling of Stress : Not at all  Social Connections: Moderately Isolated (05/06/2023)   Social Connection and Isolation Panel [NHANES]    Frequency of Communication with Friends and Family: Three times a week    Frequency of Social Gatherings with Friends and Family: Once a week    Attends Religious Services: Never    Database administrator or Organizations: Yes    Attends Engineer, structural: 1 to 4 times per year    Marital Status: Divorced    Allergies: No Known Allergies  Current Medications: Current Outpatient Medications  Medication Sig Dispense Refill   balsalazide (COLAZAL) 750 MG capsule TAKE 2 CAPSULES(1500 MG) BY MOUTH THREE TIMES DAILY 210 capsule 5   cholecalciferol (VITAMIN D3) 25 MCG (1000 UNIT) tablet Take 1,000 Units by mouth daily.     folic acid (FOLVITE) 1 MG tablet Take 2 mg by mouth daily.     levothyroxine (SYNTHROID) 50  MCG tablet TAKE 1 TABLET(50 MCG) BY MOUTH DAILY 90 tablet 1   mirtazapine (REMERON) 7.5 MG tablet Take 3 tablets (22.5 mg total) by mouth at bedtime. 90 tablet 2   Multiple Vitamins-Minerals (CENTRUM SILVER 50+WOMEN) TABS Take by mouth.     Omega 3 1200 MG CAPS Take 1 capsule (1,200 mg total) by mouth daily. (Patient not taking: Reported on 12/08/2023)     pantoprazole (PROTONIX) 40 MG tablet TAKE 1 TABLET BY MOUTH TWICE DAILY FOR 1 TO 2 WEEKS THEN DECREASE TO ONCE DAILY. TAKE 30 MINUTES PRIOR TO A MEAL 60 tablet 3   Potassium Gluconate 550 MG TABS Take 2 tablets by mouth daily.  rosuvastatin (CRESTOR) 5 MG tablet TAKE 1 TABLET(5 MG) BY MOUTH DAILY 90 tablet 3   Sennosides (EX-LAX PO) Take by mouth as directed.     valACYclovir (VALTREX) 500 MG tablet TAKE 1 TABLET(500 MG) BY MOUTH DAILY 90 tablet 1   venlafaxine XR (EFFEXOR-XR) 150 MG 24 hr capsule TAKE 1 CAPSULE BY MOUTH EVERY MORNING WITH BREAKFAST 90 capsule 1   venlafaxine XR (EFFEXOR-XR) 75 MG 24 hr capsule TAKE 1 CAPSULE BY MOUTH DAILY WITH BREAKFAST IN ADDITION TO 150 MG DAILY 90 capsule 0   No current facility-administered medications for this visit.     Objective:  Psychiatric Specialty Exam: Physical Exam Constitutional:      Appearance: the patient is not toxic-appearing.  Pulmonary:     Effort: Pulmonary effort is normal.  Neurological:     General: No focal deficit present.     Mental Status: the patient is alert and oriented to person, place, and time.   Review of Systems  Respiratory:  Negative for shortness of breath.   Cardiovascular:  Negative for chest pain.  Gastrointestinal:  Negative for abdominal pain, constipation, diarrhea, nausea and vomiting.  Neurological:  Negative for headaches.      BP 120/82   Pulse 84   Wt 125 lb (56.7 kg)   LMP 10/12/2005 (LMP Unknown)   BMI 25.25 kg/m   General Appearance: Fairly Groomed  Eye Contact:  Good  Speech:  Clear and Coherent  Volume:  Normal  Mood:  Depressed  Affect:  Congruent  Thought Process:  Coherent  Orientation:  Full (Time, Place, and Person)  Thought Content: Logical   Suicidal Thoughts:  No  Homicidal Thoughts:  No  Memory:  Immediate;   Good  Judgement:  fair  Insight:  fair  Psychomotor Activity:  Normal  Concentration:  Concentration: Good  Recall:  Good  Fund of Knowledge: Good  Language: Good  Akathisia:  No  Handed:    AIMS (if indicated): not done  Assets:  Communication Skills Desire for Improvement Financial Resources/Insurance Housing Leisure Time Physical Health  ADL's:  Intact  Cognition: WNL  Sleep:  Fair     Metabolic Disorder Labs: Lab Results  Component Value Date   HGBA1C 6.0 12/08/2023   MPG 108 06/05/2020   No results found for: "PROLACTIN" Lab Results  Component Value Date   CHOL 224 (H) 12/08/2023   TRIG 194.0 (H) 12/08/2023   HDL 67.50 12/08/2023   CHOLHDL 3 12/08/2023   VLDL 38.8 12/08/2023   LDLCALC 118 (H) 12/08/2023   LDLCALC 97 06/28/2023   Lab Results  Component Value Date   TSH 4.45 12/08/2023   TSH 0.92 06/28/2023    Therapeutic Level Labs: No results found for: "LITHIUM" No results found for: "VALPROATE" No results found for: "CBMZ"  Screenings: GAD-7    Flowsheet Row Office Visit from 12/08/2023 in [facility] [place] [facility] at [place] Counselor from 01/26/2023 in [facility] at [employer] Office Visit from 1/61/0960 in [facility]  Total GAD-7 Score 8 8 11       PHQ2-9    Flowsheet Row Office Visit from 12/08/2023 in [facility] at [place] Office Visit from 06/04/2023 in [facility] [place] [facility] at [facility] Clinical Support from 05/06/2023 in [facility] at [place] Counselor from 01/26/2023 in [facility] Behavioral Medicine at [employer] Office Visit from 4/54/0981 in [facility] at [employer]  [place]   PHQ-2 Total Score 2 2 2 4  0  PHQ-9 Total Score 7 3 4 8  --       Collaboration of Care: none  A total of 30 minutes was spent involved in face to face clinical care, chart review, documentation.   [name], MD 12/13/2023, 5:35 PM

## 2023-12-15 NOTE — Addendum Note (Signed)
 Addended by: Everlena Cooper on: 12/15/2023 10:57 AM   Modules accepted: Level of Service

## 2023-12-21 ENCOUNTER — Other Ambulatory Visit: Payer: Self-pay | Admitting: Internal Medicine

## 2023-12-26 ENCOUNTER — Other Ambulatory Visit: Payer: Self-pay | Admitting: Internal Medicine

## 2023-12-29 DIAGNOSIS — K080 Exfoliation of teeth due to systemic causes: Secondary | ICD-10-CM | POA: Diagnosis not present

## 2024-01-04 ENCOUNTER — Other Ambulatory Visit: Payer: Self-pay | Admitting: Internal Medicine

## 2024-01-05 ENCOUNTER — Encounter (HOSPITAL_COMMUNITY): Payer: Self-pay

## 2024-01-05 ENCOUNTER — Telehealth (HOSPITAL_COMMUNITY): Payer: Self-pay | Admitting: *Deleted

## 2024-01-05 ENCOUNTER — Ambulatory Visit (HOSPITAL_COMMUNITY): Admitting: Licensed Clinical Social Worker

## 2024-01-05 NOTE — Telephone Encounter (Signed)
 Pt insurance [facility]) has a quantity limit on the Mirtazapine. Despite the limit being 1 tab per at bedtime pt has been able to fill for the 7.5 mg total dose 15 mg at bedtime. However for the total dose of 22.5, mg a separate script would need to be sent to pharmacy for the additional 7.5 mg tabs at bedtime to ensure ordered dose of 22.5 mg at bedtime. Pt says this dose is working well for her. Please review and advise. Thanks.

## 2024-01-06 ENCOUNTER — Other Ambulatory Visit (HOSPITAL_COMMUNITY): Payer: Self-pay | Admitting: *Deleted

## 2024-01-06 DIAGNOSIS — F3341 Major depressive disorder, recurrent, in partial remission: Secondary | ICD-10-CM

## 2024-01-06 DIAGNOSIS — F411 Generalized anxiety disorder: Secondary | ICD-10-CM

## 2024-01-06 MED ORDER — MIRTAZAPINE 15 MG PO TABS: 22.5000 mg | ORAL_TABLET | Freq: Every day | ORAL | 2 refills | Status: DC

## 2024-01-06 NOTE — Telephone Encounter (Signed)
 Yes of course

## 2024-01-24 ENCOUNTER — Ambulatory Visit (HOSPITAL_COMMUNITY): Admitting: Student

## 2024-01-24 VITALS — BP 115/77 | HR 85 | Wt 128.0 lb

## 2024-01-24 DIAGNOSIS — F3341 Major depressive disorder, recurrent, in partial remission: Secondary | ICD-10-CM

## 2024-01-24 DIAGNOSIS — F411 Generalized anxiety disorder: Secondary | ICD-10-CM | POA: Diagnosis not present

## 2024-01-24 MED ORDER — VENLAFAXINE HCL ER 75 MG PO CP24: ORAL_CAPSULE | ORAL | 0 refills | Status: DC

## 2024-01-24 MED ORDER — VENLAFAXINE HCL ER 150 MG PO CP24: ORAL_CAPSULE | ORAL | 0 refills | Status: DC

## 2024-01-24 MED ORDER — BUSPIRONE HCL 10 MG PO TABS: 10.0000 mg | ORAL_TABLET | Freq: Two times a day (BID) | ORAL | 2 refills | Status: DC

## 2024-01-26 NOTE — Addendum Note (Signed)
 Addended by: Donnelly Gainer on: 01/26/2024 09:51 AM   Modules accepted: Level of Service

## 2024-01-26 NOTE — Patient Instructions (Signed)
 Decrease Remeron from 22.5 mg nightly to 15 mg nightly for 7 days, then decrease to 7.5 mg nightly for 7 days, then discontinue.  Start buspirone 10 mg twice daily after discontinuation of Remeron

## 2024-01-26 NOTE — Progress Notes (Signed)
 BH MD Outpatient Progress Note  Date of visit: 01/24/2024 Tammy Kennedy  MRN:  [id number]  Assessment:  Tammy Kennedy presents for follow-up evaluation.  She reports no improvement in depression or anxiety with the increased dose of Remeron.  In addition, she reports possible side effects.  Plan to taper off Remeron and start buspirone as below.  It is also noted that the patient has reported sleeping for quite a long duration (10 hours) but feels fatigued even just after waking up.  Concern for sleep apnea or other primary sleep disorder.  Referrals for sleep studies were sent in.  Identifying Information: Tammy Kennedy is a 68 y.o. y.o. female with a history of MDD, GAD who is an established patient with [facility] for management of depression and anxiety.   Plan:  # Major depressive disorder in partial remission, generalized anxiety disorder Interventions: -- Continue Effexor 225 mg daily - Decrease Remeron from 22.5 mg nightly to 15 mg nightly for 7 days, then decrease to 7.5 mg nightly for 7 days, then discontinue - After Remeron is discontinued, start buspirone 10 mg twice daily - Patient scheduled for therapy with Brayton Caves 5/6   # Hypothyroidism Interventions: -- Last TSH wnl  Patient was given contact information for behavioral health clinic and was instructed to call 911 for emergencies.   Subjective:  Chief Complaint:  Chief Complaint  Patient presents with   Follow-up    Interval History:  Today the patient reports continued depression and anxiety.  She reports limited social activity.  Unfortunately, the one time she did go out into a public setting she had a panic attack.  The patient denies experiencing anhedonia, frequently enjoying cross stitching.  She reports a fairly good appetite, but does feel it may be excessive.  She denies experiencing suicidal thoughts.  She reports continued twitching of her fingers, which she feels is due to  the Remeron.  The patient reports sleeping for a total of about 10 hours per night, if not more.  She reports that it takes about 1 hour to fall asleep.  She does not feel she has any nighttime awakenings.  In the morning she wakes up feeling tired.   Visit Diagnosis:    ICD-10-CM   1. MDD (major depressive disorder), recurrent, in partial remission (HCC)  F33.41 Ambulatory referral to Sleep Studies    Ambulatory referral to Sleep Studies    busPIRone (BUSPAR) 10 MG tablet    venlafaxine XR (EFFEXOR-XR) 150 MG 24 hr capsule    venlafaxine XR (EFFEXOR-XR) 75 MG 24 hr capsule    2. GAD (generalized anxiety disorder)  F41.1 busPIRone (BUSPAR) 10 MG tablet      Past Psychiatric History:  The patient reports a psychiatric hospitalization in 2006 for issues with alcohol.  She does not feel it was necessary.  She denies ever attempting suicide or engaging in self-injurious behavior.   Past Medical History:  Past Medical History:  Diagnosis Date   Alcohol abuse 05/14/2016   ALCOHOL ABUSE, HX OF    ANEMIA    ANXIETY DISORDER    Basal cell carcinoma (BCC) 01/10/2018   C. difficile colitis    CROHN'S DISEASE    DEPRESSION    DRY EYE SYNDROME    HEPATOMEGALY, HX OF    HYPERLIPIDEMIA    intol of statin trials   HYPOTHYROIDISM    OSTEOPENIA    Osteoporosis 05/2017   states Pre osteoporosis 8/18   Rectovaginal fistula 2012  chron's disease   Tubular adenoma 2013    Past Surgical History:  Procedure Laterality Date   ANAL FISSURE REPAIR     CATARACT EXTRACTION     EYE SURGERY  11/2017   macular pucker   HEMORRHOID SURGERY     IR RADIOLOGIST EVAL & MGMT  08/05/2021   Left thumb surgery  1989   MOHS SURGERY  2008   Nose BCC   Theraputic abortion     TOTAL HIP ARTHROPLASTY Right    09/2021    Family Psychiatric History: None pertinent  Family History:  Family History  Adopted: Yes  Problem Relation Age of Onset   Breast cancer Neg Hx     Social History:  Social History    Socioeconomic History   Marital status: Divorced    Spouse name: Not on file   Number of children: Not on file   Years of education: Not on file   Highest education level: Not on file  Occupational History   Occupation: [employer]  Tobacco Use   Smoking status: Never    Passive exposure: Never   Smokeless tobacco: Never  Vaping Use   Vaping status: Never Used  Substance and Sexual Activity   Alcohol use: Yes    Comment: has drank excessively, not drinking daily   Drug use: No   Sexual activity: Not on file  Other Topics Concern   Not on file  Social History Narrative   Works at [employer]- part time   Social Drivers of Corporate investment banker Strain: Low Risk  (05/06/2023)   Overall Financial Resource Strain (CARDIA)    Difficulty of Paying Living Expenses: Not very hard  Food Insecurity: No Food Insecurity (05/06/2023)   Hunger Vital Sign    Worried About Running Out of Food in the Last Year: Never true    Ran Out of Food in the Last Year: Never true  Transportation Needs: No Transportation Needs (05/06/2023)   PRAPARE - Administrator, Civil Service (Medical): No    Lack of Transportation (Non-Medical): No  Physical Activity: Inactive (05/06/2023)   Exercise Vital Sign    Days of Exercise per Week: 0 days    Minutes of Exercise per Session: 0 min  Stress: No Stress Concern Present (05/06/2023)   [employer] of Occupational Health - Occupational Stress Questionnaire    Feeling of Stress : Not at all  Social Connections: Moderately Isolated (05/06/2023)   Social Connection and Isolation Panel [NHANES]    Frequency of Communication with Friends and Family: Three times a week    Frequency of Social Gatherings with Friends and Family: Once a week    Attends Religious Services: Never    Database administrator or Organizations: Yes    Attends Engineer, structural: 1 to 4 times per year    Marital Status: Divorced     Allergies: No Known Allergies  Current Medications: Current Outpatient Medications  Medication Sig Dispense Refill   [START ON 02/07/2024] busPIRone (BUSPAR) 10 MG tablet Take 1 tablet (10 mg total) by mouth 2 (two) times daily. 60 tablet 2   balsalazide (COLAZAL) 750 MG capsule TAKE 2 CAPSULES(1500 MG) BY MOUTH THREE TIMES DAILY 210 capsule 5   cholecalciferol (VITAMIN D3) 25 MCG (1000 UNIT) tablet Take 1,000 Units by mouth daily.     folic acid (FOLVITE) 1 MG tablet Take 2 mg by mouth daily.     levothyroxine (SYNTHROID) 50 MCG tablet  TAKE 1 TABLET(50 MCG) BY MOUTH DAILY 90 tablet 1   Multiple Vitamins-Minerals (CENTRUM SILVER 50+WOMEN) TABS Take by mouth.     Omega 3 1200 MG CAPS Take 1 capsule (1,200 mg total) by mouth daily. (Patient not taking: Reported on 12/08/2023)     pantoprazole (PROTONIX) 40 MG tablet TAKE 1 TABLET BY MOUTH TWICE DAILY FOR 1 TO 2 WEEKS THEN DECREASE TO ONCE DAILY. TAKE 30 MINUTES PRIOR TO MEAL 60 tablet 3   Potassium Gluconate 550 MG TABS Take 2 tablets by mouth daily.      rosuvastatin (CRESTOR) 5 MG tablet TAKE 1 TABLET(5 MG) BY MOUTH DAILY 90 tablet 3   Sennosides (EX-LAX PO) Take by mouth as directed.     valACYclovir (VALTREX) 500 MG tablet TAKE 1 TABLET(500 MG) BY MOUTH DAILY 90 tablet 1   venlafaxine XR (EFFEXOR-XR) 150 MG 24 hr capsule TAKE 1 CAPSULE BY MOUTH EVERY MORNING WITH BREAKFAST 90 capsule 0   venlafaxine XR (EFFEXOR-XR) 75 MG 24 hr capsule TAKE 1 CAPSULE BY MOUTH DAILY WITH BREAKFAST IN ADDITION TO 150 MG DAILY 90 capsule 0   No current facility-administered medications for this visit.     Objective:  Psychiatric Specialty Exam: Physical Exam Constitutional:      Appearance: the patient is not toxic-appearing.  Pulmonary:     Effort: Pulmonary effort is normal.  Neurological:     General: No focal deficit present.     Mental Status: the patient is alert and oriented to person, place, and time.   Review of Systems  Respiratory:   Negative for shortness of breath.   Cardiovascular:  Negative for chest pain.  Gastrointestinal:  Negative for abdominal pain, constipation, diarrhea, nausea and vomiting.  Neurological:  Negative for headaches.      BP 115/77   Pulse 85   Wt 128 lb (58.1 kg)   LMP 10/12/2005 (LMP Unknown)   BMI 25.85 kg/m   General Appearance: Fairly Groomed  Eye Contact:  Good  Speech:  Clear and Coherent  Volume:  Normal  Mood: Depressed  Affect:  Congruent  Thought Process:  Coherent  Orientation:  Full (Time, Place, and Person)  Thought Content: Logical   Suicidal Thoughts:  No  Homicidal Thoughts:  No  Memory:  Immediate;   Good  Judgement:  fair  Insight:  fair  Psychomotor Activity:  Normal  Concentration:  Concentration: Good  Recall:  Good  Fund of Knowledge: Good  Language: Good  Akathisia:  No  Handed:    AIMS (if indicated): not done  Assets:  Communication Skills Desire for Improvement Financial Resources/Insurance Housing Leisure Time Physical Health  ADL's:  Intact  Cognition: WNL  Sleep:  Fair     Metabolic Disorder Labs: Lab Results  Component Value Date   HGBA1C 6.0 12/08/2023   MPG 108 06/05/2020   No results found for: "PROLACTIN" Lab Results  Component Value Date   CHOL 224 (H) 12/08/2023   TRIG 194.0 (H) 12/08/2023   HDL 67.50 12/08/2023   CHOLHDL 3 12/08/2023   VLDL 38.8 12/08/2023   LDLCALC 118 (H) 12/08/2023   LDLCALC 97 06/28/2023   Lab Results  Component Value Date   TSH 4.45 12/08/2023   TSH 0.92 06/28/2023    Therapeutic Level Labs: No results found for: "LITHIUM" No results found for: "VALPROATE" No results found for: "CBMZ"  Screenings: GAD-7    Flowsheet Row Office Visit from 12/08/2023 in [facility] [place] [facility] at [place] Counselor from 01/26/2023  in [facility] Medicine at [employer] Office Visit from 1/61/0960 in [facility]  Total GAD-7 Score 8 8 11        PHQ2-9    Flowsheet Row Office Visit from 12/08/2023 in [facility] at [facility] Office Visit from 06/04/2023 in [facility] at [facility] Clinical Support from 05/06/2023 in [facility] at [place] Counselor from 01/26/2023 in [facility] at [employer] Visit from 4/54/0981 in [facility] at [facility]  PHQ-2 Total Score 2 2 2 4  0  PHQ-9 Total Score 7 3 4 8  --       Collaboration of Care: none  A total of 30 minutes was spent involved in face to face clinical care, chart review, documentation.   Marilou Showman, MD 01/26/2024, 8:45 AM

## 2024-02-15 ENCOUNTER — Encounter (HOSPITAL_COMMUNITY): Payer: Self-pay | Admitting: Licensed Clinical Social Worker

## 2024-02-15 ENCOUNTER — Ambulatory Visit (INDEPENDENT_AMBULATORY_CARE_PROVIDER_SITE_OTHER): Admitting: Licensed Clinical Social Worker

## 2024-02-15 DIAGNOSIS — F4010 Social phobia, unspecified: Secondary | ICD-10-CM | POA: Diagnosis not present

## 2024-02-15 DIAGNOSIS — F330 Major depressive disorder, recurrent, mild: Secondary | ICD-10-CM | POA: Diagnosis not present
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
## 2024-02-17 ENCOUNTER — Encounter: Payer: Self-pay | Admitting: Neurology

## 2024-02-17 ENCOUNTER — Ambulatory Visit: Admitting: Neurology

## 2024-02-17 VITALS — BP 111/69 | HR 82 | Ht 59.0 in | Wt 127.6 lb

## 2024-02-17 DIAGNOSIS — G4719 Other hypersomnia: Secondary | ICD-10-CM | POA: Diagnosis not present

## 2024-02-17 DIAGNOSIS — Z9189 Other specified personal risk factors, not elsewhere classified: Secondary | ICD-10-CM

## 2024-02-17 DIAGNOSIS — G479 Sleep disorder, unspecified: Secondary | ICD-10-CM | POA: Diagnosis not present

## 2024-02-17 DIAGNOSIS — R5383 Other fatigue: Secondary | ICD-10-CM

## 2024-02-17 DIAGNOSIS — R635 Abnormal weight gain: Secondary | ICD-10-CM

## 2024-02-17 DIAGNOSIS — E663 Overweight: Secondary | ICD-10-CM

## 2024-02-17 NOTE — Progress Notes (Signed)
 Subjective:    Patient ID: Tammy Kennedy is a 68 y.o. female.  HPI    Tammy Fairy, MD, PhD [facility] [address] [address] [place], [place] [address]  Dear Drs. Sheryle Donning,  I saw your patient, Tammy Kennedy, upon your kind request in my sleep clinic today for initial consultation of her sleep disorder, in particular, concern for underlying obstructive sleep apnea.  The patient is unaccompanied today.  As you know, Tammy Kennedy is a 68 year old female with an underlying medical history of anemia, basal cell carcinoma, Crohn's disease, depression, anxiety, hyperlipidemia, hypothyroidism, osteoporosis, osteopenia, alcohol use disorder (per chart review and patient confirmation, no alcohol for the past 2 years), and borderline overweight state, who reports significant daytime tiredness.  She is not sure if she snores.  Her Epworth sleepiness score is 3 out of 24, fatigue severity score is 51/63.  I reviewed your office note from 01/24/2024.  She lives with a housemate, they do not share rooms.  Her housemate has 2 dogs, they sleep in other part of the house goes to bed generally around 1130 but watches TV till around 12:30 AM.  She has a rise time of around 10 AM.  She denies nightly nocturia or recurrent nocturnal or morning headaches.  She stopped mirtazapine  recently, it caused her to have insomnia and weight gain.  She has gained about 15 pounds in the past 6 months.  She does not drink any daily caffeine, she does not drink any alcohol any longer.  She does not smoke cigarettes.    Her Past Medical History Is Significant For: Past Medical History:  Diagnosis Date   Alcohol abuse 05/14/2016   ALCOHOL ABUSE, HX OF    ANEMIA    ANXIETY DISORDER    Basal cell carcinoma (BCC) 01/10/2018   C. difficile colitis    CROHN'S DISEASE    DEPRESSION    DRY EYE SYNDROME    HEPATOMEGALY, HX OF    HYPERLIPIDEMIA    intol of statin trials    HYPOTHYROIDISM    OSTEOPENIA    Osteoporosis 05/2017   states Pre osteoporosis 8/18   Rectovaginal fistula 2012   chron's disease   Tubular adenoma 2013    Her Past Surgical History Is Significant For: Past Surgical History:  Procedure Laterality Date   ANAL FISSURE REPAIR     CATARACT EXTRACTION     EYE SURGERY  11/2017   macular pucker   HEMORRHOID SURGERY     IR RADIOLOGIST EVAL & MGMT  08/05/2021   Left thumb surgery  1989   MOHS SURGERY  2008   Nose BCC   Theraputic abortion     TOTAL HIP ARTHROPLASTY Right    09/2021    Her Family History Is Significant For: Family History  Adopted: Yes  Problem Relation Age of Onset   Breast cancer Neg Hx     Her Social History Is Significant For: Social History   Socioeconomic History   Marital status: Divorced    Spouse name: Not on file   Number of children: Not on file   Years of education: Not on file   Highest education level: Not on file  Occupational History   Occupation: Retider  Tobacco Use   Smoking status: Never    Passive exposure: Never   Smokeless tobacco: Never  Vaping Use   Vaping status: Never Used  Substance and Sexual Activity   Alcohol use: Yes  Comment: has drank excessively, not drinking daily   Drug use: No   Sexual activity: Not on file  Other Topics Concern   Not on file  Social History Narrative   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   Social Drivers of Corporate investment banker Strain: Low Risk  (05/06/2023)   Overall Financial Resource Strain (CARDIA)    Difficulty of Paying Living Expenses: Not very hard  Food Insecurity: No Food Insecurity (05/06/2023)   Hunger Vital Sign    Worried About Running Out of Food in the Last Year: Never true    Ran Out of Food in the Last Year: Never true  Transportation Needs: No Transportation Needs (05/06/2023)   PRAPARE - Administrator, Civil Service (Medical): No    Lack of Transportation (Non-Medical):  No  Physical Activity: Inactive (05/06/2023)   Exercise Vital Sign    Days of Exercise per Week: 0 days    Minutes of Exercise per Session: 0 min  Stress: No Stress Concern Present (05/06/2023)   [employer] of Occupational Health - Occupational Stress Questionnaire    Feeling of Stress : Not at all  Social Connections: Moderately Isolated (05/06/2023)   Social Connection and Isolation Panel [NHANES]    Frequency of Communication with Friends and Family: Three times a week    Frequency of Social Gatherings with Friends and Family: Once a week    Attends Religious Services: Never    Database administrator or Organizations: Yes    Attends Banker Meetings: 1 to 4 times per year    Marital Status: Divorced    Her Allergies Are:  No Known Allergies:   Her Current Medications Are:  Outpatient Encounter Medications as of 02/17/2024  Medication Sig   balsalazide (COLAZAL ) 750 MG capsule TAKE 2 CAPSULES(1500 MG) BY MOUTH THREE TIMES DAILY   busPIRone  (BUSPAR ) 10 MG tablet Take 1 tablet (10 mg total) by mouth 2 (two) times daily.   cholecalciferol (VITAMIN D3) 25 MCG (1000 UNIT) tablet Take 1,000 Units by mouth daily.   folic acid  (FOLVITE ) 1 MG tablet Take 2 mg by mouth daily.   levothyroxine  (SYNTHROID ) 50 MCG tablet TAKE 1 TABLET(50 MCG) BY MOUTH DAILY   Multiple Vitamins-Minerals (CENTRUM SILVER 50+WOMEN) TABS Take by mouth.   pantoprazole  (PROTONIX ) 40 MG tablet TAKE 1 TABLET BY MOUTH TWICE DAILY FOR 1 TO 2 WEEKS THEN DECREASE TO ONCE DAILY. TAKE 30 MINUTES PRIOR TO MEAL   Potassium Gluconate 550 MG TABS Take 2 tablets by mouth daily.    rosuvastatin  (CRESTOR ) 5 MG tablet TAKE 1 TABLET(5 MG) BY MOUTH DAILY   Sennosides (EX-LAX PO) Take by mouth as directed.   valACYclovir  (VALTREX ) 500 MG tablet TAKE 1 TABLET(500 MG) BY MOUTH DAILY   venlafaxine  XR (EFFEXOR -XR) 150 MG 24 hr capsule TAKE 1 CAPSULE BY MOUTH EVERY MORNING WITH BREAKFAST   venlafaxine  XR (EFFEXOR -XR) 75 MG  24 hr capsule TAKE 1 CAPSULE BY MOUTH DAILY WITH BREAKFAST IN ADDITION TO 150 MG DAILY   Omega 3 1200 MG CAPS Take 1 capsule (1,200 mg total) by mouth daily. (Patient not taking: Reported on 12/08/2023)   No facility-administered encounter medications on file as of 02/17/2024.  :   Review of Systems:  Out of a complete 14 point review of systems, all are reviewed and negative with the exception of these symptoms as listed below:   Review of Systems  Neurological:  Room 9 Pt is here Alone. Pt states that she would get 8-10 hours of sleep and would still have some fatigue. Pt states that she doesn't know if she snores or not. Pt denies and Apnea when she is sleeping. ESS 3  FSS 51    Objective:  Neurological Exam  Physical Exam Physical Examination:   Vitals:   02/17/24 1255  BP: 111/69  Pulse: 82    General Examination: The patient is a very pleasant 68 y.o. female in no acute distress. She appears well-developed and well-nourished and well groomed.  She appears mildly anxious.  HEENT: Normocephalic, atraumatic, pupils are equal, round and reactive to light, extraocular tracking is good without limitation to gaze excursion or nystagmus noted. Hearing is grossly intact. Face is symmetric with normal facial animation. Speech is clear with no dysarthria noted. There is no hypophonia. There is no lip, neck/head, jaw or voice tremor. Neck is supple with full range of passive and active motion. There are no carotid bruits on auscultation. Oropharynx exam reveals: moderate mouth dryness, adequate dental hygiene and mild airway crowding, due to small airway entry, Mallampati class II, small tonsils, minimal overbite noted, neck circumference 13-1/4 inches. Tongue protrudes centrally and palate elevates symmetrically.    Chest: Clear to auscultation without wheezing, rhonchi or crackles noted.  Heart: S1+S2+0, regular and normal without murmurs, rubs or gallops noted.   Abdomen: Soft,  non-tender and non-distended.  Extremities: There is no pitting edema in the distal lower extremities bilaterally.   Skin: Warm and dry without trophic changes noted.   Musculoskeletal: exam reveals no obvious joint deformities.   Neurologically:  Mental status: The patient is awake, alert and oriented in all 4 spheres. Her immediate and remote memory, attention, language skills and fund of knowledge are appropriate. There is no evidence of aphasia, agnosia, apraxia or anomia. Speech is clear with normal prosody and enunciation. Thought process is linear. Mood is normal and affect is normal.  Cranial nerves II - XII are as described above under HEENT exam.  Motor exam: Normal bulk, strength and tone is noted. There is no obvious action or resting tremor.  Fine motor skills and coordination: grossly intact.  Cerebellar testing: No dysmetria or intention tremor. There is no truncal or gait ataxia.  Sensory exam: intact to light touch in the upper and lower extremities.  Gait, station and balance: She stands easily. No veering to one side is noted. No leaning to one side is noted. Posture is age-appropriate and stance is narrow based. Gait shows normal stride length and normal pace. No problems turning are noted.   Assessment and plan:  In summary, TARASHA DIMARTINO is a very pleasant 68 y.o.-year old female with an underlying medical history of anemia, basal cell carcinoma, Crohn's disease, depression, anxiety, hyperlipidemia, hypothyroidism, osteoporosis, osteopenia, alcohol use disorder (per chart review and patient confirmation, no alcohol for the past 2 years), and borderline overweight state, who presents for evaluation of her daytime tiredness.  Of note, she is on Effexor  long-acting, total dose of 225 mg daily.  She recently stopped taking mirtazapine  about a week ago by tapering off.  She had side effects from it.  She is advised that she may be at risk for obstructive sleep apnea.  While a  laboratory attended sleep study is typically considered "gold standard" for evaluation of sleep disordered breathing, she is apprehensive about coming in for a sleep study but will consider it if she is approved for it.  I had a long chat with the patient about my findings and the diagnosis of sleep apnea, particularly OSA, its prognosis and treatment options. We talked about medical/conservative treatments, surgical interventions and non-pharmacological approaches for symptom control. I explained, in particular, the risks and ramifications of untreated moderate to severe OSA, especially with respect to developing cardiovascular disease down the road, including congestive heart failure (CHF), difficult to treat hypertension, cardiac arrhythmias (particularly A-fib), neurovascular complications including TIA, stroke and dementia. Even type 2 diabetes has, in part, been linked to untreated OSA. Symptoms of untreated OSA may include (but may not be limited to) daytime sleepiness, nocturia (i.e. frequent nighttime urination), memory problems, mood irritability and suboptimally controlled or worsening mood disorder such as depression and/or anxiety, lack of energy, lack of motivation, physical discomfort, as well as recurrent headaches, especially morning or nocturnal headaches. We talked about the importance of maintaining a healthy lifestyle and striving for healthy weight. In addition, we talked about the importance of striving for and maintaining good sleep hygiene. I recommended a sleep study at this time. I outlined the differences between a laboratory attended sleep study which is considered more comprehensive and accurate over the option of a home sleep test (HST); the latter may lead to underestimation of sleep disordered breathing in some instances and does not help with diagnosing upper airway resistance syndrome and is not accurate enough to diagnose primary central sleep apnea typically. I outlined  possible surgical and non-surgical treatment options of OSA, including the use of a positive airway pressure (PAP) device (i.e. CPAP, AutoPAP/APAP or BiPAP in certain circumstances), a custom-made dental device (aka oral appliance, which would require a referral to a specialist dentist or orthodontist typically, and is generally speaking not considered for patients with full dentures or edentulous state), upper airway surgical options, such as traditional UPPP (which is not considered a first-line treatment) or the Inspire device (hypoglossal nerve stimulator, which would involve a referral for consultation with an ENT surgeon, after careful selection, following inclusion criteria - also not first-line treatment). I explained the PAP treatment option to the patient in detail, as this is generally considered first-line treatment.  The patient indicated that she would be willing to try PAP therapy, if the need arises. I explained the importance of being compliant with PAP treatment, not only for insurance purposes but primarily to improve patient's symptoms symptoms, and for the patient's long term health benefit, including to reduce Her cardiovascular risks longer-term.    We will pick up our discussion about the next steps and treatment options after testing.  We will keep her posted as to the test results by phone call and/or MyChart messaging where possible.  We will plan to follow-up in sleep clinic accordingly as well.  I answered all her questions today and the patient was in agreement.   I encouraged her to call with any interim questions, concerns, problems or updates or email us  through MyChart.  Generally speaking, sleep test authorizations may take up to 2 weeks, sometimes less, sometimes longer, the patient is encouraged to get in touch with us  if they do not hear back from the sleep lab staff directly within the next 2 weeks.  Thank you very much for allowing me to participate in the care of this  nice patient. If I can be of any further assistance to you please do not hesitate to call me at [phone].  Sincerely,   Tammy Fairy, MD, PhD

## 2024-02-17 NOTE — Patient Instructions (Signed)

## 2024-02-19 ENCOUNTER — Other Ambulatory Visit: Payer: Self-pay | Admitting: Internal Medicine

## 2024-03-03 ENCOUNTER — Other Ambulatory Visit: Payer: Self-pay | Admitting: Internal Medicine

## 2024-03-03 ENCOUNTER — Ambulatory Visit (INDEPENDENT_AMBULATORY_CARE_PROVIDER_SITE_OTHER): Admitting: Neurology

## 2024-03-03 DIAGNOSIS — G4719 Other hypersomnia: Secondary | ICD-10-CM

## 2024-03-03 DIAGNOSIS — G4733 Obstructive sleep apnea (adult) (pediatric): Secondary | ICD-10-CM | POA: Diagnosis not present

## 2024-03-03 DIAGNOSIS — G479 Sleep disorder, unspecified: Secondary | ICD-10-CM

## 2024-03-03 DIAGNOSIS — R5383 Other fatigue: Secondary | ICD-10-CM

## 2024-03-03 DIAGNOSIS — Z9189 Other specified personal risk factors, not elsewhere classified: Secondary | ICD-10-CM

## 2024-03-03 DIAGNOSIS — G4734 Idiopathic sleep related nonobstructive alveolar hypoventilation: Secondary | ICD-10-CM

## 2024-03-03 DIAGNOSIS — R635 Abnormal weight gain: Secondary | ICD-10-CM

## 2024-03-03 DIAGNOSIS — E663 Overweight: Secondary | ICD-10-CM

## 2024-03-13 ENCOUNTER — Ambulatory Visit (HOSPITAL_COMMUNITY): Admitting: Student

## 2024-03-13 DIAGNOSIS — F411 Generalized anxiety disorder: Secondary | ICD-10-CM

## 2024-03-13 DIAGNOSIS — F3341 Major depressive disorder, recurrent, in partial remission: Secondary | ICD-10-CM

## 2024-03-13 DIAGNOSIS — F330 Major depressive disorder, recurrent, mild: Secondary | ICD-10-CM

## 2024-03-13 MED ORDER — BUSPIRONE HCL 15 MG PO TABS: 15.0000 mg | ORAL_TABLET | Freq: Two times a day (BID) | ORAL | 2 refills | Status: DC

## 2024-03-13 MED ORDER — VENLAFAXINE HCL ER 75 MG PO CP24: ORAL_CAPSULE | ORAL | 0 refills | Status: DC

## 2024-03-13 MED ORDER — VENLAFAXINE HCL ER 150 MG PO CP24: ORAL_CAPSULE | ORAL | 0 refills | Status: DC

## 2024-03-13 NOTE — Progress Notes (Signed)
 BH MD Outpatient Progress Note  Date of visit: 03/13/2024 SAE HANDRICH  MRN:  [id number]  Assessment:  Tammy Kennedy presents for follow-up evaluation.  She reports moderate benefit have reduced anxiety from the buspirone .  Plan to increase the medication.  Discussed transitioning care to new resident physician, Dr. Chien, in early August.  The patient expressed her agreement.  Identifying Information: Tammy Kennedy is a 68 y.o. y.o. female with a history of MDD, GAD who is an established patient with [facility] for management of depression and anxiety.   Plan:  # Major depressive disorder in partial remission, generalized anxiety disorder Interventions: -- Continue Effexor  225 mg daily - Patient has discontinued Remeron  - Increase buspirone  from 10 mg twice daily to 15 mg twice daily - Patient completed intake with Jessie for therapy, scheduled to see her on 6/10   # Hypothyroidism Interventions: -- Last TSH wnl  # Frequent nighttime awakenings, concern for sleep apnea - Patient has completed home sleep test - Continue to follow-up with sleep medicine physician  Patient was given contact information for behavioral health clinic and was instructed to call 911 for emergencies.   Subjective:  Chief Complaint:  Chief Complaint  Patient presents with   Follow-up    Interval History:  Today the patient reports being more able to manage her anxiety when going out into social situations.  She feels this is due to the medication.  She reports continued depressed mood, which is a chronic issue for her.  She reports getting about 8 to 10 hours of sleep per night with frequent nighttime awakenings.  She denies having any thoughts of self-harm.  She denies medication side effects.   Visit Diagnosis:    ICD-10-CM   1. GAD (generalized anxiety disorder)  F41.1 busPIRone  (BUSPAR ) 15 MG tablet    2. MDD (major depressive disorder), recurrent episode,  mild (HCC)  F33.0        Past Psychiatric History:  The patient reports a psychiatric hospitalization in 2006 for issues with alcohol.  She does not feel it was necessary.  She denies ever attempting suicide or engaging in self-injurious behavior.   Past Medical History:  Past Medical History:  Diagnosis Date   Alcohol abuse 05/14/2016   ALCOHOL ABUSE, HX OF    ANEMIA    ANXIETY DISORDER    Basal cell carcinoma (BCC) 01/10/2018   C. difficile colitis    CROHN'S DISEASE    DEPRESSION    DRY EYE SYNDROME    HEPATOMEGALY, HX OF    HYPERLIPIDEMIA    intol of statin trials   HYPOTHYROIDISM    OSTEOPENIA    Osteoporosis 05/2017   states Pre osteoporosis 8/18   Rectovaginal fistula 2012   chron's disease   Tubular adenoma 2013    Past Surgical History:  Procedure Laterality Date   ANAL FISSURE REPAIR     CATARACT EXTRACTION     EYE SURGERY  11/2017   macular pucker   HEMORRHOID SURGERY     IR RADIOLOGIST EVAL & MGMT  08/05/2021   Left thumb surgery  1989   MOHS SURGERY  2008   Nose BCC   Theraputic abortion     TOTAL HIP ARTHROPLASTY Right    09/2021    Family Psychiatric History: None pertinent  Family History:  Family History  Adopted: Yes  Problem Relation Age of Onset   Breast cancer Neg Hx     Social History:  Social History  Socioeconomic History   Marital status: Divorced    Spouse name: Not on file   Number of children: Not on file   Years of education: Not on file   Highest education level: Not on file  Occupational History   Occupation: Retider  Tobacco Use   Smoking status: Never    Passive exposure: Never   Smokeless tobacco: Never  Vaping Use   Vaping status: Never Used  Substance and Sexual Activity   Alcohol use: Yes    Comment: has drank excessively, not drinking daily   Drug use: No   Sexual activity: Not on file  Other Topics Concern   Not on file  Social History Narrative   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   Social Drivers of Corporate investment banker Strain: Low Risk  (05/06/2023)   Overall Financial Resource Strain (CARDIA)    Difficulty of Paying Living Expenses: Not very hard  Food Insecurity: No Food Insecurity (05/06/2023)   Hunger Vital Sign    Worried About Running Out of Food in the Last Year: Never true    Ran Out of Food in the Last Year: Never true  Transportation Needs: No Transportation Needs (05/06/2023)   PRAPARE - Administrator, Civil Service (Medical): No    Lack of Transportation (Non-Medical): No  Physical Activity: Inactive (05/06/2023)   Exercise Vital Sign    Days of Exercise per Week: 0 days    Minutes of Exercise per Session: 0 min  Stress: No Stress Concern Present (05/06/2023)   [employer] of Occupational Health - Occupational Stress Questionnaire    Feeling of Stress : Not at all  Social Connections: Moderately Isolated (05/06/2023)   Social Connection and Isolation Panel [NHANES]    Frequency of Communication with Friends and Family: Three times a week    Frequency of Social Gatherings with Friends and Family: Once a week    Attends Religious Services: Never    Database administrator or Organizations: Yes    Attends Engineer, structural: 1 to 4 times per year    Marital Status: Divorced    Allergies: No Known Allergies  Current Medications: Current Outpatient Medications  Medication Sig Dispense Refill   busPIRone  (BUSPAR ) 15 MG tablet Take 1 tablet (15 mg total) by mouth 2 (two) times daily. 60 tablet 2   balsalazide (COLAZAL ) 750 MG capsule TAKE 2 CAPSULES(1500 MG) BY MOUTH THREE TIMES DAILY 210 capsule 5   cholecalciferol (VITAMIN D3) 25 MCG (1000 UNIT) tablet Take 1,000 Units by mouth daily.     folic acid  (FOLVITE ) 1 MG tablet Take 2 mg by mouth daily.     levothyroxine  (SYNTHROID ) 50 MCG tablet TAKE 1 TABLET(50 MCG) BY MOUTH DAILY 90 tablet 1   Multiple Vitamins-Minerals (CENTRUM SILVER 50+WOMEN) TABS Take by  mouth.     Omega 3 1200 MG CAPS Take 1 capsule (1,200 mg total) by mouth daily. (Patient not taking: Reported on 12/08/2023)     pantoprazole  (PROTONIX ) 40 MG tablet TAKE 1 TABLET BY MOUTH TWICE DAILY FOR 1 TO 2 WEEKS THEN DECREASE TO ONCE DAILY. TAKE 30 MINUTES PRIOR TO MEAL 60 tablet 3   Potassium Gluconate 550 MG TABS Take 2 tablets by mouth daily.      rosuvastatin  (CRESTOR ) 5 MG tablet TAKE 1 TABLET(5 MG) BY MOUTH DAILY 90 tablet 3   Sennosides (EX-LAX PO) Take by mouth as directed.     valACYclovir  (VALTREX ) 500 MG  tablet TAKE 1 TABLET(500 MG) BY MOUTH DAILY 90 tablet 1   venlafaxine  XR (EFFEXOR -XR) 150 MG 24 hr capsule TAKE 1 CAPSULE BY MOUTH EVERY MORNING WITH BREAKFAST 90 capsule 0   venlafaxine  XR (EFFEXOR -XR) 75 MG 24 hr capsule TAKE 1 CAPSULE BY MOUTH DAILY WITH BREAKFAST IN ADDITION TO 150 MG DAILY 90 capsule 0   No current facility-administered medications for this visit.     Objective:  Psychiatric Specialty Exam: Physical Exam Constitutional:      Appearance: the patient is not toxic-appearing.  Pulmonary:     Effort: Pulmonary effort is normal.  Neurological:     General: No focal deficit present.     Mental Status: the patient is alert and oriented to person, place, and time.   Review of Systems  Respiratory:  Negative for shortness of breath.   Cardiovascular:  Negative for chest pain.  Gastrointestinal:  Negative for abdominal pain, constipation, diarrhea, nausea and vomiting.  Neurological:  Negative for headaches.      LMP 10/12/2005 (LMP Unknown)   General Appearance: Fairly Groomed  Eye Contact:  Good  Speech:  Clear and Coherent  Volume:  Normal  Mood: Depressed  Affect:  Congruent  Thought Process:  Coherent  Orientation:  Full (Time, Place, and Person)  Thought Content: Logical   Suicidal Thoughts:  No  Homicidal Thoughts:  No  Memory:  Immediate;   Good  Judgement:  fair  Insight:  fair  Psychomotor Activity:  Normal  Concentration:   Concentration: Good  Recall:  Good  Fund of Knowledge: Good  Language: Good  Akathisia:  No  Handed:    AIMS (if indicated): not done  Assets:  Communication Skills Desire for Improvement Financial Resources/Insurance Housing Leisure Time Physical Health  ADL's:  Intact  Cognition: WNL  Sleep:  Fair     Metabolic Disorder Labs: Lab Results  Component Value Date   HGBA1C 6.0 12/08/2023   MPG 108 06/05/2020   No results found for: "PROLACTIN" Lab Results  Component Value Date   CHOL 224 (H) 12/08/2023   TRIG 194.0 (H) 12/08/2023   HDL 67.50 12/08/2023   CHOLHDL 3 12/08/2023   VLDL 38.8 12/08/2023   LDLCALC 118 (H) 12/08/2023   LDLCALC 97 06/28/2023   Lab Results  Component Value Date   TSH 4.45 12/08/2023   TSH 0.92 06/28/2023    Therapeutic Level Labs: No results found for: "LITHIUM" No results found for: "VALPROATE" No results found for: "CBMZ"  Screenings: GAD-7    Flowsheet Row Counselor from 02/15/2024 in [facility] Visit from 12/08/2023 in [facility] [place] Counselor from 01/26/2023 in [facility] at [employer] Office Visit from 9/60/4540 in [facility]  Total GAD-7 Score 4 8 8 11       PHQ2-9    Flowsheet Row Counselor from 02/15/2024 in [facility] at [facility] Visit from 12/08/2023 in [facility] [place] Office Visit from 06/04/2023 in [facility] [place] Clinical Support from 05/06/2023 in [facility] [place] Counselor from 01/26/2023 in [facility] Behavioral Medicine at [employer]  PHQ-2 Total Score 4 2 2 2 4   PHQ-9 Total Score 11 7 3 4 8       Flowsheet Row Counselor from 02/15/2024 in [facility] at [facility] RISK CATEGORY No  Risk        Collaboration of Care: none  A total of 30 minutes was spent involved in face to face clinical care, chart review, documentation.   Marilou Showman, MD 03/13/2024, 4:25 PM

## 2024-03-21 ENCOUNTER — Ambulatory Visit (HOSPITAL_COMMUNITY): Admitting: Licensed Clinical Social Worker

## 2024-03-21 DIAGNOSIS — F411 Generalized anxiety disorder: Secondary | ICD-10-CM

## 2024-03-21 DIAGNOSIS — F4321 Adjustment disorder with depressed mood: Secondary | ICD-10-CM | POA: Diagnosis not present

## 2024-03-21 DIAGNOSIS — F330 Major depressive disorder, recurrent, mild: Secondary | ICD-10-CM | POA: Diagnosis not present

## 2024-03-24 ENCOUNTER — Encounter (HOSPITAL_COMMUNITY): Payer: Self-pay | Admitting: Licensed Clinical Social Worker

## 2024-03-24 NOTE — Progress Notes (Signed)
   THERAPIST PROGRESS NOTE  Session Time: 1:30pm-2:25pm  Participation Level: Active  Behavioral Response: Well GroomedAlertdown, not in depressive episode  Type of Therapy: Individual Therapy  Treatment Goals addressed: Manage grief  ProgressTowards Goals: Progressing  Interventions: CBT  Summary: Tammy Kennedy is a 68 y.o. female who presents with MDD, mild, grief, and GAD   Suicidal/Homicidal: Nowithout intent/plan  Therapist Response: Tammy Kennedy engaged well in individual and person session with clinician.  Clinician utilized CBT to provide psychoeducation about grief.  Clinician assessed mood and interactions with others.  Clinician identified some improvement in energy level and motivation to clean out house and prepare a guest space.  Tammy Kennedy shared sweet memories of her friend who passed away.  Clinician identified the importance of sharing the stories of memories in order to keep him alive and her heart.  Clinician explored physical feelings of joy and grief.  Clinician also identified the importance of processing her grief and allowing the grief to come through as necessary.  Plan: Return again in 3 weeks.  Diagnosis: MDD (major depressive disorder), recurrent episode, mild (HCC)  Grief  GAD (generalized anxiety disorder)  Collaboration of Care: Psychiatrist AEB reviewed note from Dr. Connell Degree  Patient/Guardian was advised Release of Information must be obtained prior to any record release in order to collaborate their care with an outside provider. Patient/Guardian was advised if they have not already done so to contact the registration department to sign all necessary forms in order for us  to release information regarding their care.   Consent: Patient/Guardian gives verbal consent for treatment and assignment of benefits for services provided during this visit. Patient/Guardian expressed understanding and agreed to proceed.   Merleen Stare [place], LCSW 03/24/2024

## 2024-03-30 NOTE — Progress Notes (Unsigned)
 Aaron Aas

## 2024-03-31 ENCOUNTER — Ambulatory Visit: Payer: Self-pay | Admitting: Neurology

## 2024-03-31 DIAGNOSIS — G4733 Obstructive sleep apnea (adult) (pediatric): Secondary | ICD-10-CM

## 2024-03-31 DIAGNOSIS — G4734 Idiopathic sleep related nonobstructive alveolar hypoventilation: Secondary | ICD-10-CM

## 2024-03-31 NOTE — Procedures (Signed)
 [facility]  HOME SLEEP TEST (SANSA) REPORT (Mail-Out Device):   STUDY DATE: 03/09/2024  DOB: [birth year]  MRN: [id number]  ORDERING CLINICIAN: [name], MD, PhD   REFERRING CLINICIAN: Dr. Connell Degree  CLINICAL INFORMATION/HISTORY: 68 year old female with an underlying medical history of anemia, basal cell carcinoma, Crohn's disease, depression, anxiety, hyperlipidemia, hypothyroidism, osteoporosis, osteopenia, alcohol use disorder (per chart review and patient confirmation, no alcohol for the past 2 years), and borderline overweight state, who reports significant daytime tiredness.   PATIENT'S LAST REPORTED EPWORTH SLEEPINESS SCORE (ESS): 3/24.  BMI (at the time of sleep clinic visit and/or test date): 25.8 kg/m  FINDINGS:   Study Protocol:    The SANSA single-point-of-skin-contact chest-worn sensor - an FDA cleared and DOT approved type 4 home sleep test device - measures eight physiological channels,  including blood oxygen saturation (measured via PPG [photoplethysmography]), EKG-derived heart rate, respiratory effort, chest movement (measured via accelerometer), snoring, body position, and actigraphy. The device is designed to be worn for up to 10 hours per study.   Sleep Summary:   Total Recording Time (hours, min): 9 hours, 57 min  Total Effective Sleep Time (hours, min):  6 hours, 45 min  Sleep Efficiency (%):    72%   Respiratory Indices:   Calculated sAHI (per hour):  14.6/hour         Oxygen Saturation Statistics:    Oxygen Saturation (%) Mean: 91%   Minimum oxygen saturation (%):                 79.6%   O2 Saturation Range (%): 79.6-99.1%   Time below or at 88% saturation: 21 min   Pulse Rate Statistics:   Pulse Mean (bpm):    67/min    Pulse Range (57-96/min)   Snoring: Mild to moderate  IMPRESSION/DIAGNOSES:   OSA (obstructive sleep apnea)  Nocturnal Hypoxemia  RECOMMENDATIONS:   This home sleep test demonstrates  overall mild - near moderate (by number of events) - obstructive sleep apnea with a total AHI of 14.6/hour and O2 nadir of 79.6% with significant time below or at 88% saturation of over 20 minutes for the night, indicating nocturnal hypoxemia. Snoring was detected, in the mild to moderate range. Given the patient's medical history and sleep related complaints, therapy with a positive airway pressure device is a reasonable first-line choice and clinically recommended. Treatment can be achieved in the form of autoPAP trial/titration at home for now. A full night, in-lab PAP titration study may aid in improving proper treatment settings and with mask fit, if needed, down the road. Alternative treatments may include weight loss (where appropriate) along with avoidance of the supine sleep position (if possible), or an oral appliance in appropriate candidates.   Please note that untreated obstructive sleep apnea may carry additional perioperative morbidity. Patients with significant obstructive sleep apnea should receive perioperative PAP therapy and the surgeons and particularly the anesthesiologist should be informed of the diagnosis and the severity of the sleep disordered breathing. The patient should be cautioned not to drive, work at heights, or operate dangerous or heavy equipment when tired or sleepy. Review and reiteration of good sleep hygiene measures should be pursued with any patient. Other causes of the patient's symptoms, including circadian rhythm disturbances, an underlying mood disorder, medication effect and/or an underlying medical problem cannot be ruled out based on this test. Clinical correlation is recommended.  The patient and her referring provider will be notified of the test results. The patient will be  seen in follow up in sleep clinic at [facility], as necessary.  I certify that I have reviewed the raw data recording prior to the issuance of this report in accordance with the standards of the  American Academy of Sleep Medicine (AASM).    INTERPRETING PHYSICIAN:   [name], MD, PhD Medical Director, [facility]) Diplomat, ABPN (Neurology and Sleep)   [facility] [address] [place], [place] [address] [phone]

## 2024-04-12 NOTE — Telephone Encounter (Signed)
 I spoke with the patient and discussed her sleep study results as noted below by Dr Buck. The patient expressed to me that she is not interested in using a cpap machine. Patient mentioned she was hesitant to the use machine also because she lives alone. I did try to provide some information to try to help her feel more comfortable with using it alone but she still is not interested. She said she feels she is sleeping well. Patient said she was in the bed so much because she has been depressed. She states she is under the care of psychiatry and medication adjustments are currently being made. Pt states at the time of the sleep study she was coming off some of her medication. So far she does feel her depression is improving and she is not sleeping as much during the day. She denies any thoughts or intents or harming herself or others.  She is potentially interested in exploring an oral appliance if this is ok with Dr Buck. Patient thanked me for the call.

## 2024-04-12 NOTE — Telephone Encounter (Signed)
-----   Message from True Mar sent at 03/31/2024  2:29 PM EDT ----- Patient referred by PCP, seen by me on 02/17/2024, patient had a HST on 03/09/2024.    Please call and notify the patient that the recent home sleep test showed obstructive sleep apnea in the near moderate range with overall lower oxygen saturations at night and significant  desaturations intermittently.  Therefore, I recommend treatment in the form of autoPAP, which means, that we don't have to bring her in for a sleep study with CPAP, but will let her start using a so  called autoPAP machine at home, which is a CPAP-like machine with self-adjusting pressures. We will send the order to a local DME company (of her choice, or as per insurance requirement). The DME  representative will fit her with a mask, educate her on how to use the machine, how to put the mask on, etc. I have placed an order in the chart. Please send the order, talk to patient, send report  to referring MD. We will need a FU in sleep clinic for 10 weeks post-PAP set up, please arrange that with me or one of our NPs. Also reinforce the need for compliance with treatment. Thanks,   True Mar, MD, PhD [facility])    ----- Message ----- From: Mar True, MD Sent: 03/31/2024   2:27 PM EDT To: True Mar, MD

## 2024-04-12 NOTE — Telephone Encounter (Signed)
 I spoke with the patient and discussed the message from Dr. Buck.  At this time the patient is hesitant to commit to any treatment without getting information first.  I offered to provide her with some pamphlets we have here and we also can refer her to a DME company where she does not have to commit but could at least get more information beyond what we can give here.  The patient is not ready for the referral for oral appliance or AutoPap but she will consider.  She is willing to talk to Dr. Geofm as well.  I told her I would send the sleep study results to Dr. Geofm. She thanked me for the call and her questions were answered. Patient was also encouraged to look up [facility] online and she said she would.   Pamphlets placed upfront for patient (info on autopap therapy and Dr Wayna pamphlet for oral appliance).

## 2024-04-12 NOTE — Telephone Encounter (Signed)
 I would recommend an autoPAP first, since she has evidence of nocturnal hypoxemia and this is typically not much improved with oral appliance treatment.  Nevertheless, if she wishes to see a dentist, we can certainly initiate a referral.  Please place a referral to either Dr. Micky or Dr. Melba after speaking with her.  I also recommend that she follow-up with her primary care provider to see what could be other causes of low her oxygen saturations at night.  She may need further workup with a pulmonologist or cardiologist.

## 2024-04-17 ENCOUNTER — Other Ambulatory Visit: Payer: Self-pay | Admitting: Internal Medicine

## 2024-04-17 DIAGNOSIS — Z1231 Encounter for screening mammogram for malignant neoplasm of breast: Secondary | ICD-10-CM

## 2024-05-08 ENCOUNTER — Ambulatory Visit (INDEPENDENT_AMBULATORY_CARE_PROVIDER_SITE_OTHER): Payer: Medicare Other

## 2024-05-08 VITALS — BP 120/62 | HR 97 | Ht 63.0 in | Wt 123.2 lb

## 2024-05-08 DIAGNOSIS — Z0000 Encounter for general adult medical examination without abnormal findings: Secondary | ICD-10-CM | POA: Diagnosis not present

## 2024-05-08 NOTE — Patient Instructions (Signed)
 Tammy Kennedy , Thank you for taking time out of your busy schedule to complete your Annual Wellness Visit with me. I enjoyed our conversation and look forward to speaking with you again next year. I, as well as your care team,  appreciate your ongoing commitment to your health goals. Please review the following plan we discussed and let me know if I can assist you in the future. Your Game plan/ To Do List    Referrals: If you haven't heard from the office you've been referred to, please reach out to them at the phone provided.  Mammogram is scheduled for 05/2024 Follow up Visits: Next Medicare AWV with our clinical staff: 05/09/2022   Have you seen your provider in the last 6 months (3 months if uncontrolled diabetes)? Yes Next Office Visit with your provider: 06/15/2024  Clinician Recommendations:  Aim for 30 minutes of exercise or brisk walking, 6-8 glasses of water, and 5 servings of fruits and vegetables each day. Educated and advised on getting the Shingles and COVID vaccines in 2025.      This is a list of the screening recommended for you and due dates:  Health Maintenance  Topic Date Due   COVID-19 Vaccine (5 - 2024-25 season) 06/13/2023   Zoster (Shingles) Vaccine (1 of 2) 08/08/2024*   Flu Shot  05/12/2024   Medicare Annual Wellness Visit  05/08/2025   Mammogram  05/18/2025   DEXA scan (bone density measurement)  06/09/2025   Colon Cancer Screening  05/03/2026   DTaP/Tdap/Td vaccine (3 - Td or Tdap) 06/04/2029   Pneumococcal Vaccine for age over 81  Completed   Hepatitis C Screening  Completed   Hepatitis B Vaccine  Aged Out   HPV Vaccine  Aged Out   Meningitis B Vaccine  Aged Out  *Topic was postponed. The date shown is not the original due date.    Advanced directives: (Copy Requested) Please bring a copy of your health care power of attorney and living will to the office to be added to your chart at your convenience. You can mail to [facility] [address] [place], [place] [address] or email to [email or web address] Advance Care Planning is important because it:  [x]  Makes sure you receive the medical care that is consistent with your values, goals, and preferences  [x]  It provides guidance to your family and loved ones and reduces their decisional burden about whether or not they are making the right decisions based on your wishes.  Follow the link provided in your after visit summary or read over the paperwork we have mailed to you to help you started getting your Advance Directives in place. If you need assistance in completing these, please reach out to us  so that we can help you!

## 2024-05-08 NOTE — Progress Notes (Signed)
 Subjective:   Tammy Kennedy is a 68 y.o. who presents for a Medicare Wellness preventive visit.  As a reminder, Annual Wellness Visits don't include a physical exam, and some assessments may be limited, especially if this visit is performed virtually. We may recommend an in-person follow-up visit with your provider if needed.  Visit Complete: In person   Persons Participating in Visit: Patient.  AWV Questionnaire: Yes: Patient Medicare AWV questionnaire was completed by the patient on 05/02/2024; I have confirmed that all information answered by patient is correct and no changes since this date.  Cardiac Risk Factors include: advanced age (>46men, >12 women);dyslipidemia     Objective:    Today's Vitals   05/08/24 1356  BP: 120/62  Pulse: 97  Weight: 123 lb 3.2 oz (55.9 kg)  Height: 5' 3 (1.6 m)   Body mass index is 21.82 kg/m.     05/08/2024    1:55 PM 05/06/2023    1:47 PM 06/01/2022   11:21 AM 07/02/2017    1:20 PM  Advanced Directives  Does Patient Have a Medical Advance Directive? Yes Yes No No   Type of Estate agent of [place];Living will Healthcare Power of Attorney    Copy of Healthcare Power of Attorney in Chart? No - copy requested No - copy requested    Would patient like information on creating a medical advance directive?   No - Patient declined      Data saved with a previous flowsheet row definition    Current Medications (verified) Outpatient Encounter Medications as of 05/08/2024  Medication Sig   balsalazide (COLAZAL ) 750 MG capsule TAKE 2 CAPSULES(1500 MG) BY MOUTH THREE TIMES DAILY   busPIRone  (BUSPAR ) 15 MG tablet Take 1 tablet (15 mg total) by mouth 2 (two) times daily.   cholecalciferol (VITAMIN D3) 25 MCG (1000 UNIT) tablet Take 1,000 Units by mouth daily.   folic acid  (FOLVITE ) 1 MG tablet Take 2 mg by mouth daily.   levothyroxine  (SYNTHROID ) 50 MCG tablet TAKE 1 TABLET(50 MCG) BY MOUTH DAILY   Multiple  Vitamins-Minerals (CENTRUM SILVER 50+WOMEN) TABS Take by mouth.   pantoprazole  (PROTONIX ) 40 MG tablet TAKE 1 TABLET BY MOUTH TWICE DAILY FOR 1 TO 2 WEEKS THEN DECREASE TO ONCE DAILY. TAKE 30 MINUTES PRIOR TO MEAL   Potassium Gluconate 550 MG TABS Take 2 tablets by mouth daily.    rosuvastatin  (CRESTOR ) 5 MG tablet TAKE 1 TABLET(5 MG) BY MOUTH DAILY   Sennosides (EX-LAX PO) Take by mouth as directed.   valACYclovir  (VALTREX ) 500 MG tablet TAKE 1 TABLET(500 MG) BY MOUTH DAILY   venlafaxine  XR (EFFEXOR -XR) 150 MG 24 hr capsule TAKE 1 CAPSULE BY MOUTH EVERY MORNING WITH BREAKFAST   venlafaxine  XR (EFFEXOR -XR) 75 MG 24 hr capsule TAKE 1 CAPSULE BY MOUTH DAILY WITH BREAKFAST IN ADDITION TO 150 MG DAILY   Omega 3 1200 MG CAPS Take 1 capsule (1,200 mg total) by mouth daily. (Patient not taking: Reported on 05/08/2024)   No facility-administered encounter medications on file as of 05/08/2024.    Allergies (verified) Patient has no known allergies.   History: Past Medical History:  Diagnosis Date   Alcohol abuse 05/14/2016   ALCOHOL ABUSE, HX OF    ANEMIA    ANXIETY DISORDER    Basal cell carcinoma (BCC) 01/10/2018   C. difficile colitis    CROHN'S DISEASE    DEPRESSION    DRY EYE SYNDROME    HEPATOMEGALY, HX OF  HYPERLIPIDEMIA    intol of statin trials   HYPOTHYROIDISM    OSTEOPENIA    Osteoporosis 05/2017   states Pre osteoporosis 8/18   Rectovaginal fistula 2012   chron's disease   Tubular adenoma 2013   Past Surgical History:  Procedure Laterality Date   ANAL FISSURE REPAIR     CATARACT EXTRACTION     EYE SURGERY  11/2017   macular pucker   HEMORRHOID SURGERY     IR RADIOLOGIST EVAL & MGMT  08/05/2021   Left thumb surgery  1989   MOHS SURGERY  2008   Nose BCC   Theraputic abortion     TOTAL HIP ARTHROPLASTY Right    09/2021   Family History  Adopted: Yes  Problem Relation Age of Onset   Breast cancer Neg Hx    Social History   Socioeconomic History   Marital  status: Divorced    Spouse name: Not on file   Number of children: Not on file   Years of education: Not on file   Highest education level: Bachelor's degree (e.g., BA, AB, BS)  Occupational History   Occupation: Retider  Tobacco Use   Smoking status: Never    Passive exposure: Never   Smokeless tobacco: Never  Vaping Use   Vaping status: Never Used  Substance and Sexual Activity   Alcohol use: Yes    Alcohol/week: 1.0 standard drink of alcohol    Types: 1 Glasses of wine per week    Comment: has drank excessively, not drinking daily   Drug use: No   Sexual activity: Yes  Other Topics Concern   Not on file  Social History Narrative   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   Social Drivers of Corporate investment banker Strain: Low Risk  (05/08/2024)   Overall Financial Resource Strain (CARDIA)    Difficulty of Paying Living Expenses: Not hard at all  Food Insecurity: No Food Insecurity (05/08/2024)   Hunger Vital Sign    Worried About Running Out of Food in the Last Year: Never true    Ran Out of Food in the Last Year: Never true  Transportation Needs: No Transportation Needs (05/08/2024)   PRAPARE - Administrator, Civil Service (Medical): No    Lack of Transportation (Non-Medical): No  Physical Activity: Inactive (05/08/2024)   Exercise Vital Sign    Days of Exercise per Week: 0 days    Minutes of Exercise per Session: 0 min  Stress: Stress Concern Present (05/08/2024)   [employer] of Occupational Health - Occupational Stress Questionnaire    Feeling of Stress: Rather much  Social Connections: Socially Isolated (05/08/2024)   Social Connection and Isolation Panel    Frequency of Communication with Friends and Family: More than three times a week    Frequency of Social Gatherings with Friends and Family: Twice a week    Attends Religious Services: Never    Database administrator or Organizations: No    Attends Museum/gallery exhibitions officer: Never    Marital Status: Divorced    Tobacco Counseling Counseling given: No    Clinical Intake:  Pre-visit preparation completed: Yes  Pain : No/denies pain     BMI - recorded: 21.82 Nutritional Status: BMI of 19-24  Normal Nutritional Risks: None Diabetes: No  Lab Results  Component Value Date   HGBA1C 6.0 12/08/2023   HGBA1C 5.8 06/28/2023   HGBA1C 5.8 12/04/2022     How often  do you need to have someone help you when you read instructions, pamphlets, or other written materials from your doctor or pharmacy?: 1 - Never  Interpreter Needed?: No  Information entered by :: Verdie Saba, CMA   Activities of Daily Living     05/08/2024    1:58 PM 05/02/2024    6:28 PM  In your present state of health, do you have any difficulty performing the following activities:  Hearing? 0 0  Vision? 0 0  Difficulty concentrating or making decisions? 0 0  Walking or climbing stairs? 0 0  Dressing or bathing? 0 0  Doing errands, shopping? 0 0  Preparing Food and eating ? N N  Using the Toilet? N N  In the past six months, have you accidently leaked urine? N N  Do you have problems with loss of bowel control? N N  Managing your Medications? N N  Managing your Finances? N N  Housekeeping or managing your Housekeeping? N N    Patient Care Team: Geofm Glade PARAS, MD as PCP - General (Internal Medicine) Obie Princella HERO, MD (Inactive) as Referring Physician (Gastroenterology) Cleotilde Sewer, OD (Optometry) [place], Tawni CROME, MD as Referring Physician (Dermatology)  I have updated your Care Teams any recent Medical Services you may have received from other providers in the past year.     Assessment:   This is a routine wellness examination for Cesily.  Hearing/Vision screen Hearing Screening - Comments:: Denies hearing difficulties   Vision Screening - Comments:: Wears eyeglasses for reading - appt w/Sally Cleotilde in 06/2024   Goals Addressed                This Visit's Progress     Patient Stated (pt-stated)        Patient stated she plans to start doing some walking.         Depression Screen     05/08/2024    1:59 PM 02/15/2024    2:16 PM 12/08/2023   10:56 AM 06/04/2023   10:47 AM 05/06/2023    1:52 PM 01/26/2023   11:23 AM 12/04/2022   11:08 AM  PHQ 2/9 Scores  PHQ - 2 Score 0  2 2 2   0  PHQ- 9 Score 0  7 3 4        Information is confidential and restricted. Go to Review Flowsheets to unlock data.    Fall Risk     05/08/2024    1:58 PM 05/02/2024    6:28 PM 06/04/2023   10:47 AM 05/06/2023    1:47 PM 12/04/2022   11:08 AM  Fall Risk   Falls in the past year? 0 0 0 0 0  Number falls in past yr: 0 0 0 0 0  Injury with Fall? 0 0 0 0 0  Risk for fall due to : No Fall Risks  No Fall Risks No Fall Risks No Fall Risks  Follow up Falls evaluation completed;Falls prevention discussed  Falls evaluation completed Falls prevention discussed Falls evaluation completed    MEDICARE RISK AT HOME:  Medicare Risk at Home Any stairs in or around the home?: Yes If so, are there any without handrails?: No Home free of loose throw rugs in walkways, pet beds, electrical cords, etc?: Yes Adequate lighting in your home to reduce risk of falls?: Yes Life alert?: No Use of a cane, walker or w/c?: No Grab bars in the bathroom?: No Shower chair or bench in shower?: Yes Elevated toilet seat or  a handicapped toilet?: No  TIMED UP AND GO:  Was the test performed?  No  Cognitive Function: 6CIT completed        05/08/2024    2:08 PM 05/06/2023    1:49 PM 06/01/2022   11:38 AM  6CIT Screen  What Year? 0 points 0 points 0 points  What month? 0 points 0 points 0 points  What time? 0 points 0 points 0 points  Count back from 20 0 points 0 points 0 points  Months in reverse 0 points 0 points 0 points  Repeat phrase 0 points 0 points 0 points  Total Score 0 points 0 points 0 points    Immunizations Immunization History   Administered Date(s) Administered   PFIZER(Purple Top)SARS-COV-2 Vaccination 01/05/2020, 01/26/2020, 08/24/2020   PNEUMOCOCCAL CONJUGATE-20 06/10/2021   Pfizer Covid-19 Vaccine Bivalent Booster 30yrs & up 08/22/2021   Pneumococcal Conjugate-13 04/24/2015   Pneumococcal Polysaccharide-23 03/15/2013   Td 07/25/2007   Tdap 06/05/2019    Screening Tests Health Maintenance  Topic Date Due   COVID-19 Vaccine (5 - 2024-25 season) 06/13/2023   Zoster Vaccines- Shingrix (1 of 2) 08/08/2024 (Originally 05/31/1975)   INFLUENZA VACCINE  05/12/2024   Medicare Annual Wellness (AWV)  05/08/2025   MAMMOGRAM  05/18/2025   DEXA SCAN  06/09/2025   Colonoscopy  05/03/2026   DTaP/Tdap/Td (3 - Td or Tdap) 06/04/2029   Pneumococcal Vaccine: 50+ Years  Completed   Hepatitis C Screening  Completed   Hepatitis B Vaccines  Aged Out   HPV VACCINES  Aged Out   Meningococcal B Vaccine  Aged Out    Health Maintenance  Health Maintenance Due  Topic Date Due   COVID-19 Vaccine (5 - 2024-25 season) 06/13/2023   Health Maintenance Items Addressed: Mammogram scheduled  I have recommended that this patient have a immunization for Shingles but she declines at this time. I have discussed the risks and benefits of this procedure with her. The patient verbalizes understanding.   Additional Screening:  Vision Screening: Recommended annual ophthalmology exams for early detection of glaucoma and other disorders of the eye. Would you like a referral to an eye doctor? No    Dental Screening: Recommended annual dental exams for proper oral hygiene  Community Resource Referral / Chronic Care Management: CRR required this visit?  No   CCM required this visit?  No   Plan:    I have personally reviewed and noted the following in the patient's chart:   Medical and social history Use of alcohol, tobacco or illicit drugs  Current medications and supplements including opioid prescriptions. Patient is not  currently taking opioid prescriptions. Functional ability and status Nutritional status Physical activity Advanced directives List of other physicians Hospitalizations, surgeries, and ER visits in previous 12 months Vitals Screenings to include cognitive, depression, and falls Referrals and appointments  In addition, I have reviewed and discussed with patient certain preventive protocols, quality metrics, and best practice recommendations. A written personalized care plan for preventive services as well as general preventive health recommendations were provided to patient.   Verdie CHRISTELLA Saba, CMA   05/08/2024   After Visit Summary: (In Person-Declined) Patient declined AVS at this time.  Notes: Nothing significant to report at this time.

## 2024-05-09 ENCOUNTER — Encounter (HOSPITAL_COMMUNITY): Payer: Self-pay | Admitting: Licensed Clinical Social Worker

## 2024-05-09 ENCOUNTER — Ambulatory Visit (HOSPITAL_COMMUNITY): Admitting: Licensed Clinical Social Worker

## 2024-05-09 DIAGNOSIS — F411 Generalized anxiety disorder: Secondary | ICD-10-CM | POA: Diagnosis not present

## 2024-05-09 DIAGNOSIS — F4321 Adjustment disorder with depressed mood: Secondary | ICD-10-CM | POA: Diagnosis not present

## 2024-05-09 DIAGNOSIS — F3341 Major depressive disorder, recurrent, in partial remission: Secondary | ICD-10-CM | POA: Diagnosis not present

## 2024-05-09 NOTE — Progress Notes (Signed)
   THERAPIST PROGRESS NOTE  Session Time: 1:30pm-2:25pm Participation Level: Active  Behavioral Response: Well GroomedAlertEuthymic  Type of Therapy: Individual Therapy  Treatment Goals addressed: Manage grief   ProgressTowards Goals: Progressing  Interventions: Motivational Interviewing  Summary: Tammy Kennedy is a 68 y.o. female who presents with MDD, in partial remission, grief, and GAD.   Suicidal/Homicidal: Nowithout intent/plan  Therapist Response: Tammy Kennedy engaged well in individual in person session with clinician. Clinician utilized MI OARS to reflect and summarize thoughts, feelings, and interactions. Clinician explored changes in household and plans to live on her own again following her current housemate's departure. Clinician discussed factors that are both positive and negative about living on her own. Clinician also noted the importance of being involved in the community and with volunteerism. Tammy Kennedy shared the different roles she has played in [employer] in town, and the challenges of letting go of past responsibilities. Clinician identified the grief associated with passing on these responsibilities. Tammy Kennedy shared that these events have become so much of her identity that it has been hard to make peace with that.   Plan: Return again in 4 weeks.  Diagnosis: MDD (major depressive disorder), recurrent, in partial remission (HCC)  Grief  Generalized anxiety disorder  Collaboration of Care: Patient refused AEB none required  Patient/Guardian was advised Release of Information must be obtained prior to any record release in order to collaborate their care with an outside provider. Patient/Guardian was advised if they have not already done so to contact the registration department to sign all necessary forms in order for us  to release information regarding their care.   Consent: Patient/Guardian gives verbal consent for treatment and assignment of benefits for services  provided during this visit. Patient/Guardian expressed understanding and agreed to proceed.   [name] [place], LCSW 05/09/2024

## 2024-05-12 NOTE — Progress Notes (Signed)
 [facility] Progress Note  05/15/2024 11:16 AM Kennedy Kennedy  MRN:  [id number]  Assessment:  Kennedy Kennedy presents for follow-up evaluation. Today, 05/15/24, patient reports euthymic mood and much improvement in her depression and anxiety. She reports occasional sadness and anxiety that can occur once a week but overall does not endorse persistent depressed or anxious mood. Objectively, minimal PHQ-9 and GAD 7 as well. She reports adverse effect of dry mouth with the buspar  and discussed increasing fluid intake and taking the buspar  with food to minimize the morning breath that the patient experiences with the buspar . She denies other side effects to the medications. Shared decision making with patient to make no changes to her medications. We also discussed her OSA diagnosis and the barriers to her getting treatment for this. She reports she will also talk with her PCP regarding the risks of untreated OSA. She currently reports no issues with sleep. She endorsed social alcohol use as well. Will continue to monitor.   Identifying Information: Kennedy Kennedy is a 68 y.o. female with a history of MDD, GAD  who is an established patient with [facility] for management of medications.  Risk Assessment: An assessment of suicide and violence risk factors was performed as part of this evaluation and is not significantly changed from the last visit.             While future psychiatric events cannot be accurately predicted, the patient does not currently require acute inpatient psychiatric care and does not currently meet [place]  involuntary commitment criteria.          Plan:  # Major depressive disorder in partial remission, generalized anxiety disorder -- Continue Effexor  225 mg daily for depression - continue buspirone  15 mg twice daily for anxiety - continue to see Kennedy Kennedy for therapy  # OSA  -- patient declined autopap and oral appliance  -- encourage  f/u with sleep doctor   # Hypothyroidism -- Last TSH wnl  Health maintenance: PCP: Kennedy Kennedy -Crohn's on colazal  -hypothyroidism on synthroid   -HLD on crestor   -OSA, declined treatment  Return to care in:  Future Appointments  Date Time Provider Department Center  05/19/2024 11:00 AM [facility] MM 2 [facility]-BCGMM [facility]  06/13/2024  3:30 PM Schlosberg, Kennedy Kennedy BH-[facility] Kennedy  06/15/2024 10:40 AM Kennedy Kennedy  08/07/2024 10:30 AM Kennedy Kennedy Kennedy  05/09/2025  1:50 PM [facility]-ANNUAL WELLNESS VISIT Kennedy Kennedy    Patient was given contact information for behavioral health clinic and was instructed to call 911 for emergencies.   Patient and plan of care will be discussed with the Attending Kennedy ,Kennedy Kennedy, who agrees with the above statement and plan.   Subjective:  Chief Complaint: No chief complaint on file.  Interval History:  Labs: CMP, CBC, UDS, PDMP  11/2023 CBC wnl, 11/2023 CMP wnl, 11/2023 vit D wnl. 11/2023 LDL elevated (118). 11/2023 TSH wnl. 11/2023 A1c prediabetes (6%)  PDMP neg  EKG: 01/2011 Qtc 420, NSR MRI brain / EEG: Kennedy  Sleep study: OSA  Last visit, med changes    Saw Dr. Marry 03/2024: continued effexor , increased buspar  Interval notes: saw Kennedy Kennedy 6/10 and 7/29 for therapy, has upcoming appointment 05/15/24  Home sleep test showed OSA, recommended autoPAP, patient declined, wanted oral appliance but then declined that as well, was given pamphlet    Reports it's been 2 years since her partner died. Reports is taking  medication in the morning. Reports her depression is much better, was previously not moving around. Reports she is moving around more, she has more motivation and desire to get things done and taking on new projects and wanting to complete them, reports now doing more cross-stitch. Reports her sleep is fine, states that she is sleeping better without a machine. Regarding her barrier to CPAP use, she  reports the machine seems cumbersome, annoying, another thing that she needs to take care of. Reports she just doesn't want to deal with it, reports if her sleep gets worse in the future will consider it. Reports she sleeps around 7-8 hours a night, waking up once during the night. Reports that she will also talk with PCP about risks of untreated OSA as well. Reports no issues with appetite. Reports mood is much better, feels that the buspar  helped with the stress. Still feel stressed and depressed at times. Reports one day out of the week was feeling stressed and depressed. Reports went to see Kennedy Kennedy for therapy and it went well. Has another appointment in a month. Stressor is relationship with brother and potentially moving to [place] , told her 6-9 months ago. He's coming to visit in August. Reports no side effects to effexor . Reports a morning breath taste with the buspar . Reports helps to brush her teeth. Denies suicidal or homicidal thoughts. Shared decision making to continue current medication regimen. She endorses social alcohol use.   PHQ-9: 2 (feeling down, feeling bad about self - reports that these are more between not at all and several days) GAD-7: 3 (feeling nervous, not able to stop worrying, worrying too much)   Visit Diagnosis:    ICD-10-CM   1. GAD (generalized anxiety disorder)  F41.1 busPIRone  (BUSPAR ) 15 MG tablet    2. MDD (major depressive disorder), recurrent, in partial remission (HCC)  F33.41 venlafaxine  XR (EFFEXOR -XR) 75 MG 24 hr capsule    venlafaxine  XR (EFFEXOR -XR) 150 MG 24 hr capsule      Past Psychiatric History:  Diagnoses: MDD, GAD Medication trials:   Current: effexor , buspar   Past: effexor , remeron  (twitching finger), abilify , buspar  Previous psychiatrist/therapist: Dr. Marry Kennedy Kennedy  Hospitalizations: intensive outpatient 2006 for issues with alcohol. Suicide attempts: denied SIB: denied Hx of violence towards others: denied  Current access  to guns: denied  Hx of trauma/abuse: denied   Initial note: per Dr. Marry [name] Kennedy is a 68 y.o. female with a history of anxiety who presents in person to [facility] for initial evaluation of possible side effects with Abilify .  The patient reports taking Effexor  for approximately 20 years and feels that the medication has worked well for her.  She states that Abilify  was added in October 2023, several months after the loss of her former romantic partner of 10 years.  In the months following the initiation of Abilify , she reports gaining approximately 10 pounds (which she feels is a lot given her short stature) as well as having increased dizziness.  She also reports sleeping excessively during the day, which she wonders might also be related to the Abilify .  Because we have not followed the patient longitudinally, it is difficult to say with certainty whether or not these 3 side effects (weight gain, dizziness, and somnolence) are due to the Abilify .  However, it is very possible that all 3 of them are, and because the patient's psychiatric symptomatology is relatively mild at this time (per her report and appearance on exam), it makes sense to  reduce the dose of Abilify  from 5 mg daily to 2 mg daily, considering that as patients become older they are more sensitive to even what are only considered low doses of medication such as Abilify .  The patient does not desire to continue seeing psychiatry.  For now, we will prescribe the 2 mg tablet for 30 days and let her return to her primary care physician for further care.  We would be more than happy to see Kennedy Kennedy back in our clinic should she desire.   Substance Use History: denied Reports used cocaine in the past and alcohol  Still drinks socially: 2-3 drinks once a week, last drank on Saturday  Denies history of alcohol withdrawal seizures or hospitalizations  Cocaine has been around 18 years ago  Past Medical  History:  Past Medical History:  Diagnosis Date   Alcohol abuse 05/14/2016   ALCOHOL ABUSE, HX OF    ANEMIA    ANXIETY DISORDER    Basal cell carcinoma (BCC) 01/10/2018   C. difficile colitis    CROHN'S DISEASE    DEPRESSION    DRY EYE SYNDROME    HEPATOMEGALY, HX OF    HYPERLIPIDEMIA    intol of statin trials   HYPOTHYROIDISM    OSTEOPENIA    Osteoporosis 05/2017   states Pre osteoporosis 8/18   Rectovaginal fistula 2012   chron's disease   Tubular adenoma 2013    Past Surgical History:  Procedure Laterality Date   ANAL FISSURE REPAIR     CATARACT EXTRACTION     EYE SURGERY  11/2017   macular pucker   HEMORRHOID SURGERY     IR RADIOLOGIST EVAL & MGMT  08/05/2021   Left thumb surgery  1989   MOHS SURGERY  2008   Nose BCC   Theraputic abortion     TOTAL HIP ARTHROPLASTY Right    09/2021   Contraception: post-menopausal  Family Psychiatric History: Kennedy pertinent   Family History:  Family History  Adopted: Yes  Problem Relation Age of Onset   Breast cancer Neg Hx    Social History:  Reports she has a bachelor's degree in Counselling psychologist, focused on graphite and print making  The patient reports that she is presently retired, starting in 2019. 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 She states that her former romantic partner of 10 years, and housemate at the time, was popping pills and either accidentally or intentionally overdose and denied. She reports that this has been difficult for her and is why she started counseling. Presently she states that she lives with a 68 year old female, and has been staying with this person for the past year. She states they have a good relationship. She reports having a brother in [place] , and while they do not talk frequently, they have a good relationship. She reports rarely drinking alcohol when she  goes out for social events. He denies the use of marijuana, or illegal drugs, or nicotine. Denies having children. Considers her roommate and friends support system, does volunteer work. She is adopted.   Substance Use History:   Social History   Socioeconomic History   Marital status: Divorced    Spouse name: Not on file   Number of children: Not on file   Years of education: Not on file   Highest education level: Bachelor's degree (e.g., BA, AB,  BS)  Occupational History   Occupation: Retider  Tobacco Use   Smoking status: Never    Passive exposure: Never   Smokeless tobacco: Never  Vaping Use   Vaping status: Never Used  Substance and Sexual Activity   Alcohol use: Yes    Alcohol/week: 1.0 standard drink of alcohol    Types: 1 Glasses of wine per week    Comment: has drank excessively, not drinking daily   Drug use: No   Sexual activity: Yes  Other Topics Concern   Not on file  Social History Narrative   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   Social Drivers of Corporate investment banker Strain: Low Risk  (05/08/2024)   Overall Financial Resource Strain (CARDIA)    Difficulty of Paying Living Expenses: Not hard at all  Food Insecurity: No Food Insecurity (05/08/2024)   Hunger Vital Sign    Worried About Running Out of Food in the Last Year: Never true    Ran Out of Food in the Last Year: Never true  Transportation Needs: No Transportation Needs (05/08/2024)   PRAPARE - Administrator, Civil Service (Medical): No    Lack of Transportation (Non-Medical): No  Physical Activity: Inactive (05/08/2024)   Exercise Vital Sign    Days of Exercise per Week: 0 days    Minutes of Exercise per Session: 0 min  Stress: Stress Concern Present (05/08/2024)   [employer] of Occupational Health - Occupational Stress Questionnaire    Feeling of Stress: Rather much  Social Connections: Socially Isolated (05/08/2024)   Social Connection and  Isolation Panel    Frequency of Communication with Friends and Family: More than three times a week    Frequency of Social Gatherings with Friends and Family: Twice a week    Attends Religious Services: Never    Database administrator or Organizations: No    Attends Engineer, structural: Never    Marital Status: Divorced    Allergies: No Known Allergies  Current Medications: Current Outpatient Medications  Medication Sig Dispense Refill   balsalazide (COLAZAL ) 750 MG capsule TAKE 2 CAPSULES(1500 MG) BY MOUTH THREE TIMES DAILY 210 capsule 5   busPIRone  (BUSPAR ) 15 MG tablet Take 1 tablet (15 mg total) by mouth 2 (two) times daily. 60 tablet 2   cholecalciferol (VITAMIN D3) 25 MCG (1000 UNIT) tablet Take 1,000 Units by mouth daily.     folic acid  (FOLVITE ) 1 MG tablet Take 2 mg by mouth daily.     levothyroxine  (SYNTHROID ) 50 MCG tablet TAKE 1 TABLET(50 MCG) BY MOUTH DAILY 90 tablet 1   Multiple Vitamins-Minerals (CENTRUM SILVER 50+WOMEN) TABS Take by mouth.     Omega 3 1200 MG CAPS Take 1 capsule (1,200 mg total) by mouth daily. (Patient not taking: Reported on 05/08/2024)     pantoprazole  (PROTONIX ) 40 MG tablet TAKE 1 TABLET BY MOUTH TWICE DAILY FOR 1 TO 2 WEEKS THEN DECREASE TO ONCE DAILY. TAKE 30 MINUTES PRIOR TO MEAL 60 tablet 3   Potassium Gluconate 550 MG TABS Take 2 tablets by mouth daily.      rosuvastatin  (CRESTOR ) 5 MG tablet TAKE 1 TABLET(5 MG) BY MOUTH DAILY 90 tablet 3   Sennosides (EX-LAX PO) Take by mouth as directed.     valACYclovir  (VALTREX ) 500 MG tablet TAKE 1 TABLET(500 MG) BY MOUTH DAILY 90 tablet 1   venlafaxine  XR (EFFEXOR -XR) 150 MG 24 hr capsule TAKE 1 CAPSULE BY MOUTH EVERY MORNING  WITH BREAKFAST 30 capsule 2   venlafaxine  XR (EFFEXOR -XR) 75 MG 24 hr capsule TAKE 1 CAPSULE BY MOUTH DAILY WITH BREAKFAST IN ADDITION TO 150 MG DAILY 30 capsule 2   No current facility-administered medications for this visit.    ROS: Review of Systems Respiratory:   Negative for shortness of breath.   Cardiovascular:  Negative for chest pain.  Gastrointestinal:  Negative for abdominal pain, constipation, diarrhea, nausea and vomiting.  Neurological:  Negative for headaches.    Objective:  Psychiatric Specialty Exam: Blood pressure 118/89, pulse 88, weight 123 lb 12.8 oz (56.2 kg), last menstrual period 10/12/2005, SpO2 100%.Body mass index is 21.93 kg/m.  General Appearance: Fairly Groomed, has gray hair, wearing black flowy top  Eye Contact:  Good  Speech:  Clear and Coherent  Volume:  Normal  Mood:  Euthymic  Affect:  Appropriate  Thought Content: Logical   Suicidal Thoughts:  No  Homicidal Thoughts:  No  Thought Process:  Coherent  Orientation:  Full (Time, Place, and Person)    Memory: Grossly intact   Judgment:  Fair  Insight:  Fair  Concentration:  Concentration: Fair  Recall: not formally assessed   Fund of Knowledge: Fair  Language: Fair  Psychomotor Activity:  Normal  Akathisia:  No  AIMS (if indicated): not done  Assets:  Communication Skills Desire for Improvement Financial Resources/Insurance Housing Resilience Social Support Talents/Skills  ADL's:  Intact  Cognition: WNL  Sleep:  Good   PE: General: well-appearing; no acute distress  Pulm: no increased work of breathing on room air  Strength & Muscle Tone: within normal limits Neuro: no focal neurological deficits observed  Gait & Station: normal  Metabolic Disorder Labs: Lab Results  Component Value Date   HGBA1C 6.0 12/08/2023   MPG 108 06/05/2020   No results found for: PROLACTIN Lab Results  Component Value Date   CHOL 224 (H) 12/08/2023   TRIG 194.0 (H) 12/08/2023   HDL 67.50 12/08/2023   CHOLHDL 3 12/08/2023   VLDL 38.8 12/08/2023   LDLCALC 118 (H) 12/08/2023   LDLCALC 97 06/28/2023   Lab Results  Component Value Date   TSH 4.45 12/08/2023   TSH 0.92 06/28/2023    Therapeutic Level Labs: No results found for: LITHIUM No results  found for: VALPROATE No results found for: CBMZ  Screenings:  GAD-7    Advertising copywriter from 02/15/2024 in [facility] at [facility] Visit from 12/08/2023 in [facility] [place] Counselor from 01/26/2023 in [facility] at [facility] Office Visit from 1/75/7979 in [facility]  Total GAD-7 Score 4 8 8 11    PHQ2-9    Flowsheet Row Clinical Support from 05/08/2024 in [facility] [place] Counselor from 02/15/2024 in [facility] at [facility] Visit from 12/08/2023 in [facility] [place] Office Visit from 06/04/2023 in [facility] Clinical Support from 05/06/2023 in [facility] [place]  PHQ-2 Total Score 0 4 2 2 2   PHQ-9 Total Score 0 11 7 3 4    Flowsheet Row Counselor from 02/15/2024 in [facility] at [facility] RISK CATEGORY No Risk    Collaboration of Care: Collaboration of Care: Medication Management AEB Kennedy Kennedy  Patient/Guardian was advised Release of Information must be obtained prior to any record release in order to  collaborate their care with an outside provider. Patient/Guardian was advised if they have not already done so to contact the registration department to sign all necessary forms in order for us  to release information regarding their care.   Consent: Patient/Guardian gives verbal consent for treatment and assignment of benefits for services provided during this visit. Patient/Guardian expressed understanding and agreed to proceed.   Corean Minor, Kennedy, PGY-3 05/15/2024, 11:16 AM

## 2024-05-15 ENCOUNTER — Other Ambulatory Visit: Payer: Self-pay | Admitting: Internal Medicine

## 2024-05-15 ENCOUNTER — Ambulatory Visit (HOSPITAL_COMMUNITY): Admitting: Psychiatry

## 2024-05-15 DIAGNOSIS — F3341 Major depressive disorder, recurrent, in partial remission: Secondary | ICD-10-CM | POA: Diagnosis not present

## 2024-05-15 DIAGNOSIS — F411 Generalized anxiety disorder: Secondary | ICD-10-CM

## 2024-05-15 MED ORDER — VENLAFAXINE HCL ER 75 MG PO CP24
ORAL_CAPSULE | ORAL | 2 refills | Status: DC
Start: 2024-05-15 — End: 2024-08-07

## 2024-05-15 MED ORDER — VENLAFAXINE HCL ER 150 MG PO CP24: ORAL_CAPSULE | ORAL | 2 refills | Status: DC

## 2024-05-15 MED ORDER — BUSPIRONE HCL 15 MG PO TABS: 15.0000 mg | ORAL_TABLET | Freq: Two times a day (BID) | ORAL | 2 refills | Status: DC

## 2024-05-15 NOTE — Addendum Note (Signed)
 Addended by: CARVIN CROCK on: 05/15/2024 04:35 PM   Modules accepted: Level of Service

## 2024-05-17 DIAGNOSIS — D485 Neoplasm of uncertain behavior of skin: Secondary | ICD-10-CM | POA: Diagnosis not present

## 2024-05-17 DIAGNOSIS — D0462 Carcinoma in situ of skin of left upper limb, including shoulder: Secondary | ICD-10-CM | POA: Diagnosis not present

## 2024-05-17 DIAGNOSIS — L821 Other seborrheic keratosis: Secondary | ICD-10-CM | POA: Diagnosis not present

## 2024-05-17 DIAGNOSIS — L814 Other melanin hyperpigmentation: Secondary | ICD-10-CM | POA: Diagnosis not present

## 2024-05-17 DIAGNOSIS — L578 Other skin changes due to chronic exposure to nonionizing radiation: Secondary | ICD-10-CM | POA: Diagnosis not present

## 2024-05-17 DIAGNOSIS — D225 Melanocytic nevi of trunk: Secondary | ICD-10-CM | POA: Diagnosis not present

## 2024-05-19 ENCOUNTER — Ambulatory Visit
Admission: RE | Admit: 2024-05-19 | Discharge: 2024-05-19 | Disposition: A | Discharge status: Home / Self Care | Source: Ambulatory Visit | Attending: Internal Medicine | Admitting: Internal Medicine

## 2024-05-19 DIAGNOSIS — Z1231 Encounter for screening mammogram for malignant neoplasm of breast: Secondary | ICD-10-CM | POA: Diagnosis not present

## 2024-05-20 ENCOUNTER — Other Ambulatory Visit: Payer: Self-pay | Admitting: Internal Medicine

## 2024-05-23 ENCOUNTER — Other Ambulatory Visit: Payer: Self-pay | Admitting: Internal Medicine

## 2024-06-13 ENCOUNTER — Encounter (HOSPITAL_COMMUNITY): Payer: Self-pay | Admitting: Licensed Clinical Social Worker

## 2024-06-13 ENCOUNTER — Ambulatory Visit (HOSPITAL_COMMUNITY): Admitting: Licensed Clinical Social Worker

## 2024-06-13 DIAGNOSIS — F3341 Major depressive disorder, recurrent, in partial remission: Secondary | ICD-10-CM

## 2024-06-13 NOTE — Progress Notes (Signed)
   THERAPIST PROGRESS NOTE  Session Time: 3:30pm-4:15pm  Participation Level: Active  Behavioral Response: Well GroomedAlertEuthymic  Type of Therapy: Individual Therapy  Treatment Goals addressed: Manage grief   ProgressTowards Goals: Progressing  Interventions: CBT  Summary: Tammy Kennedy is a 68 y.o. female who presents with MDD in partial remission.   Suicidal/Homicidal: Nowithout intent/plan  Therapist Response: [name] engaged well in individual in person session. Clinician explored thoughts, feelings, and interactions using MI OARS. Clinician discussed interactions and relationship with brother. Clinician processed life being adopted as a baby, and growing up with brother, who was also adopted by the same couple. Clinician noted that brother has sought out his family of origin and developed relationships. [name] shared that she does not have much interest in pursuing relationships with strangers. Clinician discussed her options and noted that there are no rules or correct answers for this choice. Clinician explored updates with roommate and identified boundaries that are important to maintain. Mood remains stable with current meds.   Plan: Return again in 4 weeks.  Diagnosis: MDD (major depressive disorder), recurrent, in partial remission (HCC)  Collaboration of Care: Patient refused AEB none required  Patient/Guardian was advised Release of Information must be obtained prior to any record release in order to collaborate their care with an outside provider. Patient/Guardian was advised if they have not already done so to contact the registration department to sign all necessary forms in order for us  to release information regarding their care.   Consent: Patient/Guardian gives verbal consent for treatment and assignment of benefits for services provided during this visit. Patient/Guardian expressed understanding and agreed to proceed.   [name] [place],  LCSW 06/13/2024

## 2024-06-14 ENCOUNTER — Encounter: Payer: Self-pay | Admitting: Internal Medicine

## 2024-06-14 NOTE — Patient Instructions (Addendum)

## 2024-06-14 NOTE — Progress Notes (Signed)
 Subjective:    Patient ID: Tammy Kennedy, female    DOB: 1955/12/14, 68 y.o.   MRN: [id number]      HPI Sumer is here for a Physical exam and her chronic medical problems.      Medications and allergies reviewed with patient and updated if appropriate.  Current Outpatient Medications on File Prior to Visit  Medication Sig Dispense Refill  . balsalazide (COLAZAL ) 750 MG capsule TAKE 2 CAPSULES(1500 MG) BY MOUTH THREE TIMES DAILY 210 capsule 5  . busPIRone  (BUSPAR ) 15 MG tablet Take 1 tablet (15 mg total) by mouth 2 (two) times daily. 60 tablet 2  . cholecalciferol (VITAMIN D3) 25 MCG (1000 UNIT) tablet Take 1,000 Units by mouth daily.    . folic acid  (FOLVITE ) 1 MG tablet Take 2 mg by mouth daily.    . levothyroxine  (SYNTHROID ) 50 MCG tablet TAKE 1 TABLET(50 MCG) BY MOUTH DAILY 90 tablet 1  . Multiple Vitamins-Minerals (CENTRUM SILVER 50+WOMEN) TABS Take by mouth.    . pantoprazole  (PROTONIX ) 40 MG tablet TAKE 1 TABLET BY MOUTH TWICE DAILY FOR 1 TO 2 WEEKS THEN DECREASE TO ONCE DAILY. TAKE 30 MINUTES PRIOR TO MEAL 60 tablet 3  . Potassium Gluconate 550 MG TABS Take 2 tablets by mouth daily.     . rosuvastatin  (CRESTOR ) 5 MG tablet TAKE 1 TABLET(5 MG) BY MOUTH DAILY 90 tablet 3  . Sennosides (EX-LAX PO) Take by mouth as directed.    . valACYclovir  (VALTREX ) 500 MG tablet TAKE 1 TABLET(500 MG) BY MOUTH DAILY 90 tablet 1  . venlafaxine  XR (EFFEXOR -XR) 150 MG 24 hr capsule TAKE 1 CAPSULE BY MOUTH EVERY MORNING WITH BREAKFAST 30 capsule 2  . venlafaxine  XR (EFFEXOR -XR) 75 MG 24 hr capsule TAKE 1 CAPSULE BY MOUTH DAILY WITH BREAKFAST IN ADDITION TO 150 MG DAILY 30 capsule 2   No current facility-administered medications on file prior to visit.    Review of Systems     Objective:  There were no vitals filed for this visit. There were no vitals filed for this visit. There is no height or weight on file to calculate BMI.  BP Readings from Last 3 Encounters:  05/08/24 120/62   02/17/24 111/69  12/08/23 122/70    Wt Readings from Last 3 Encounters:  05/08/24 123 lb 3.2 oz (55.9 kg)  02/17/24 127 lb 9.6 oz (57.9 kg)  12/08/23 125 lb 12.8 oz (57.1 kg)       Physical Exam Constitutional: She appears well-developed and well-nourished. No distress.  HENT:  Head: Normocephalic and atraumatic.  Right Ear: External ear normal. Normal ear canal and TM Left Ear: External ear normal.  Normal ear canal and TM Mouth/Throat: Oropharynx is clear and moist.  Eyes: Conjunctivae normal.  Neck: Neck supple. No tracheal deviation present. No thyromegaly present.  No carotid bruit  Cardiovascular: Normal rate, regular rhythm and normal heart sounds.   No murmur heard.  No edema. Pulmonary/Chest: Effort normal and breath sounds normal. No respiratory distress. She has no wheezes. She has no rales.  Breast: deferred   Abdominal: Soft. She exhibits no distension. There is no tenderness.  Lymphadenopathy: She has no cervical adenopathy.  Skin: Skin is warm and dry. She is not diaphoretic.  Psychiatric: She has a normal mood and affect. Her behavior is normal.     Lab Results  Component Value Date   WBC 4.9 12/08/2023   HGB 14.5 12/08/2023   HCT 43.9 12/08/2023   PLT 241.0  12/08/2023   GLUCOSE 88 12/08/2023   CHOL 224 (H) 12/08/2023   TRIG 194.0 (H) 12/08/2023   HDL 67.50 12/08/2023   LDLDIRECT 106.0 12/04/2022   LDLCALC 118 (H) 12/08/2023   ALT 13 12/08/2023   AST 29 12/08/2023   NA 141 12/08/2023   K 4.3 12/08/2023   CL 104 12/08/2023   CREATININE 0.73 12/08/2023   BUN 20 12/08/2023   CO2 28 12/08/2023   TSH 4.45 12/08/2023   INR 1.0 09/23/2021   HGBA1C 6.0 12/08/2023         Assessment & Plan:   Physical exam: Screening blood work  ordered Exercise   Weight  normal Substance abuse  none   Reviewed recommended immunizations.   Health Maintenance  Topic Date Due  . Influenza Vaccine  Never done  . COVID-19 Vaccine (5 - 2025-26 season)  06/12/2024  . Zoster Vaccines- Shingrix (1 of 2) 08/08/2024 (Originally 05/31/1975)  . Medicare Annual Wellness (AWV)  05/08/2025  . DEXA SCAN  06/09/2025  . Colonoscopy  05/03/2026  . Mammogram  05/19/2026  . DTaP/Tdap/Td (3 - Td or Tdap) 06/04/2029  . Pneumococcal Vaccine: 50+ Years  Completed  . Hepatitis C Screening  Completed  . HPV VACCINES  Aged Out  . Meningococcal B Vaccine  Aged Out          See Problem List for Assessment and Plan of chronic medical problems.     This encounter was created in error - please disregard.

## 2024-06-15 ENCOUNTER — Encounter: Payer: Medicare Other | Admitting: Internal Medicine

## 2024-06-15 DIAGNOSIS — E785 Hyperlipidemia, unspecified: Secondary | ICD-10-CM

## 2024-06-15 DIAGNOSIS — M810 Age-related osteoporosis without current pathological fracture: Secondary | ICD-10-CM

## 2024-06-15 DIAGNOSIS — K5080 Crohn's disease of both small and large intestine without complications: Secondary | ICD-10-CM

## 2024-06-15 DIAGNOSIS — Z0000 Encounter for general adult medical examination without abnormal findings: Secondary | ICD-10-CM

## 2024-06-15 DIAGNOSIS — K219 Gastro-esophageal reflux disease without esophagitis: Secondary | ICD-10-CM

## 2024-06-15 DIAGNOSIS — F329 Major depressive disorder, single episode, unspecified: Secondary | ICD-10-CM

## 2024-06-15 DIAGNOSIS — E559 Vitamin D deficiency, unspecified: Secondary | ICD-10-CM

## 2024-06-15 DIAGNOSIS — E038 Other specified hypothyroidism: Secondary | ICD-10-CM

## 2024-06-15 DIAGNOSIS — R7303 Prediabetes: Secondary | ICD-10-CM

## 2024-06-15 DIAGNOSIS — F411 Generalized anxiety disorder: Secondary | ICD-10-CM

## 2024-06-15 NOTE — Assessment & Plan Note (Signed)
 Chronic Taking vitamin D daily Check vitamin D level

## 2024-06-15 NOTE — Assessment & Plan Note (Signed)
 Chronic Regular exercise and healthy diet encouraged Check lipid panel,tsh, cmp Continue Crestor 5 mg daily

## 2024-06-15 NOTE — Assessment & Plan Note (Signed)
 Chronic Lab Results  Component Value Date   HGBA1C 6.0 12/08/2023    Check a1c Low sugar / carb diet Stressed regular exercise

## 2024-06-15 NOTE — Assessment & Plan Note (Signed)
Chronic Controlled Management per Dr. Lavon Paganini

## 2024-06-15 NOTE — Assessment & Plan Note (Signed)
 Chronic  Lab Results  Component Value Date   TSH 4.45 12/08/2023   Check tsh and will titrate med dose if needed Currently taking levothyroxine  50 mcg daily

## 2024-06-15 NOTE — Assessment & Plan Note (Signed)
 Chronic seeing psychiatry and seeing a therapist regularly Controlled On Effexor  225 mg daily

## 2024-06-15 NOTE — Assessment & Plan Note (Signed)
 Chronic DEXA up to date Has been on Boniva in the past and had side effects Not exercising - stressed regular exercise Continue calcium  and vitamin D  supplementation Check vitamin D  level Discussed medication and that she should consider taking something for her bones Discussed increased risk of fracture

## 2024-06-15 NOTE — Assessment & Plan Note (Signed)
 Chronic Management per psychiatry On buspirone  15 mg twice daily, venlafaxine  225 mg daily

## 2024-06-15 NOTE — Assessment & Plan Note (Signed)
 Chronic GERD controlled Continue pantoprazole 40 mg 1-2 times a day

## 2024-06-29 DIAGNOSIS — H5213 Myopia, bilateral: Secondary | ICD-10-CM | POA: Diagnosis not present

## 2024-07-18 ENCOUNTER — Ambulatory Visit (HOSPITAL_COMMUNITY): Admitting: Licensed Clinical Social Worker

## 2024-07-18 ENCOUNTER — Encounter (HOSPITAL_COMMUNITY): Payer: Self-pay | Admitting: Licensed Clinical Social Worker

## 2024-07-18 DIAGNOSIS — F3341 Major depressive disorder, recurrent, in partial remission: Secondary | ICD-10-CM

## 2024-07-18 NOTE — Progress Notes (Signed)
   THERAPIST PROGRESS NOTE  Session Time: 1:30pm-2:15pm  Participation Level: Active  Behavioral Response: Well GroomedAlertEuthymic  Type of Therapy: Individual Therapy  Treatment Goals addressed: Manage grief   ProgressTowards Goals: Progressing  Interventions: CBT  Summary: Tammy Kennedy is a 68 y.o. female who presents with MDD, recurrent, in partial remission.   Suicidal/Homicidal: Nowithout intent/plan  Therapist Response: Tammy Kennedy engaged well in individual session with clinician. Clinician utilized CBT to process thoughts, feelings, and interactions. 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 Clinician reviewed the experiences, noting some hesitation at the beginning of this planning process up until the execution of the event. Clinician discussed pros and cons of working this event. Tammy Kennedy shared positive feedback and enjoyment from the artists and the guests. She also shared that she got her steps in by walking the park several times that day, which led to tiredness and sore back and feet today. Clinician identified the price was worth the experience. Clinician discussed relationship with house mates and noted preparation for them to move out. Tammy Kennedy shared that her depression has been well managed and she feels prepared to have her next appt in 6 weeks.   Plan: Return again in 6 weeks.  Diagnosis: MDD (major depressive disorder), recurrent, in partial remission  Collaboration of Care: Patient refused AEB none required  Patient/Guardian was advised Release of Information must be obtained prior to any record release in order to collaborate their care with an outside provider. Patient/Guardian was advised if they have not already done so to contact the registration department to sign all necessary forms in order for us  to release information regarding their care.   Consent: Patient/Guardian gives verbal consent for treatment and assignment of  benefits for services provided during this visit. Patient/Guardian expressed understanding and agreed to proceed.   [name] [place], LCSW 07/18/2024

## 2024-08-03 NOTE — Progress Notes (Signed)
 BH MD Outpatient Progress Note  08/07/2024 1:24 PM Tammy Kennedy  MRN:  [id number]  Assessment:  Tammy Kennedy presents for follow-up evaluation. Today, 08/07/24, patient reports euthymic mood and continued improvement in her depression and anxiety. She denies persistent depressed or anxious mood and feels that the medications are working well. She continues to report some dry mouth with the buspar  though she reports that this has improved and she reports she takes the buspar  with food and with water. She denies other side effects to the medications. Shared decision making with patient to make no changes to her medications. She continues to decline machine treatment for her OSA and reports that she feels that her sleep is fine without it. She reports she will also talk with her PCP regarding the risks of untreated OSA and has upcoming appointment in Feb. She currently reports no issues with sleep. Continued to encourage follow-up with Tammy Kennedy for therapy.  Identifying Information: Tammy Kennedy is a 68 y.o. female with a history of MDD, GAD  who is an established patient with [facility] for management of medications.  Risk Assessment: An assessment of suicide and violence risk factors was performed as part of this evaluation and is not significantly changed from the last visit.             While future psychiatric events cannot be accurately predicted, the patient does not currently require acute inpatient psychiatric care and does not currently meet [place]  involuntary commitment criteria.          Plan:  # Major depressive disorder in partial remission, generalized anxiety disorder -- Continue Effexor  225 mg daily for depression - continue buspirone  15 mg twice daily for anxiety - continue to see Tammy Kennedy for therapy  # OSA  -- patient declined autopap and oral appliance  -- encourage f/u with sleep doctor   # Hypothyroidism -- Last TSH wnl  Health  maintenance: PCP: Tammy Glade PARAS, MD -Crohn'Kennedy on colazal  -hypothyroidism on synthroid   -HLD on crestor   -OSA, declined treatment  Labs: 11/2023 CBC wnl, 11/2023 CMP wnl, 11/2023 vit D wnl. 11/2023 LDL elevated (118). 11/2023 TSH wnl. 11/2023 A1c prediabetes (6%), PDMP neg  EKG: 01/2011 Qtc 420, NSR MRI brain / EEG: none  Sleep study: OSA   Return to care in:  Future Appointments  Date Time Provider Department Center  09/12/2024  1:30 PM Tammy Tammy Kennedy SAUNDERS, LCSW [facility] None  12/08/2024  1:00 PM Tammy Glade PARAS, MD [facility]  05/09/2025  1:50 PM [facility]-ANNUAL WELLNESS VISIT [facility]    Patient was given contact information for behavioral health clinic and was instructed to call 911 for emergencies.   Patient and plan of care will be discussed with the Attending MD who agrees with the above statement and plan.   Subjective:  Chief Complaint: No chief complaint on file.  Interval History:  -saw Tammy Kennedy for therapy in Sept and Oct, has upcoming appointment 08/2024, feels prepared for therapy appointments q6 weeks  Patient reports mood is well, feels medications are working well. Reports still has moments of depression, do not feel long-lasting or deep. Reports can happen once or twice a week, may last for 30 min and then will think of good things that are going on in her life. Reports her brother has come to visit her twice, reports he still lives in [place] . She reports still has anxiety but can work herself through it.  Patient reports getting  7-8 hours of sleep. Reports housemate wakes her up since living in floor above her but is moving out Nov 15th. Reports will still hangout with friend at crafts fair every weekend. Patient reports good appetite. Patient reports stressors include none, reports have avoided situations such as large crowds and big events but is able to do smaller events. Patient reports adherence with medications. Patient reports still has some of  the taste with buspar , reports has an aftertaste with the pill, reports aftertaste still. Patient reports no substance use. Patient denies SI/HI/AVH. Reports therapy has been going well with Tammy Kennedy.   Visit Diagnosis:    ICD-10-CM   1. MDD (major depressive disorder), recurrent, in partial remission  F33.41 venlafaxine  XR (EFFEXOR -XR) 75 MG 24 hr capsule    venlafaxine  XR (EFFEXOR -XR) 150 MG 24 hr capsule    2. GAD (generalized anxiety disorder)  F41.1 busPIRone  (BUSPAR ) 15 MG tablet       Past Psychiatric History:  Diagnoses: MDD, GAD Medication trials:   Current: effexor , buspar   Past: effexor , remeron  (twitching finger), abilify , buspar  Previous psychiatrist/therapist: Dr. Marry Tammy Kennedy  Hospitalizations: intensive outpatient 2006 for issues with alcohol. Suicide attempts: denied SIB: denied Hx of violence towards others: denied  Current access to guns: denied  Hx of trauma/abuse: denied   Initial note: per Dr. Marry Rudell FORBES Kennedy is a 68 y.o. female with a history of anxiety who presents in person to [facility] for initial evaluation of possible side effects with Abilify .  The patient reports taking Effexor  for approximately 20 years and feels that the medication has worked well for her.  She states that Abilify  was added in October 2023, several months after the loss of her former romantic partner of 10 years.  In the months following the initiation of Abilify , she reports gaining approximately 10 pounds (which she feels is a lot given her short stature) as well as having increased dizziness.  She also reports sleeping excessively during the day, which she wonders might also be related to the Abilify .  Because we have not followed the patient longitudinally, it is difficult to say with certainty whether or not these 3 side effects (weight gain, dizziness, and somnolence) are due to the Abilify .  However, it is very possible that all 3 of them are,  and because the patient'Kennedy psychiatric symptomatology is relatively mild at this time (per her report and appearance on exam), it makes sense to reduce the dose of Abilify  from 5 mg daily to 2 mg daily, considering that as patients become older they are more sensitive to even what are only considered low doses of medication such as Abilify .  The patient does not desire to continue seeing psychiatry.  For now, we will prescribe the 2 mg tablet for 30 days and let her return to her primary care physician for further care.  We would be more than happy to see Ms Carneiro back in our clinic should she desire.   Substance Use History: denied Reports used cocaine in the past and alcohol  Still drinks socially: 2-3 drinks once a week, last drank on Saturday  Denies history of alcohol withdrawal seizures or hospitalizations  Cocaine has been around 18 years ago  Past Medical History:  Past Medical History:  Diagnosis Date   Alcohol abuse 05/14/2016   ALCOHOL ABUSE, HX OF    ANEMIA    ANXIETY DISORDER    Basal cell carcinoma (BCC) 01/10/2018   C. difficile colitis  CROHN'Kennedy DISEASE    DEPRESSION    DRY EYE SYNDROME    HEPATOMEGALY, HX OF    HYPERLIPIDEMIA    intol of statin trials   HYPOTHYROIDISM    OSTEOPENIA    Osteoporosis 05/2017   states Pre osteoporosis 8/18   Rectovaginal fistula 2012   chron'Kennedy disease   Tubular adenoma 2013    Past Surgical History:  Procedure Laterality Date   ANAL FISSURE REPAIR     CATARACT EXTRACTION     EYE SURGERY  11/2017   macular pucker   HEMORRHOID SURGERY     IR RADIOLOGIST EVAL & MGMT  08/05/2021   Left thumb surgery  1989   MOHS SURGERY  2008   Nose BCC   Theraputic abortion     TOTAL HIP ARTHROPLASTY Right    09/2021   Contraception: post-menopausal  Family Psychiatric History: None pertinent   Family History:  Family History  Adopted: Yes  Problem Relation Age of Onset   Breast cancer Neg Hx    Social History:  Reports she has a  bachelor'Kennedy degree in counselling psychologist, focused on graphite and print making  The patient reports that she is presently retired, starting in 2019. 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 She states that her former romantic partner of 10 years, and housemate at the time, was popping pills and either accidentally or intentionally overdose and denied. She reports that this has been difficult for her and is why she started counseling. Presently she states that she lives with a 68 year old female, and has been staying with this person for the past year. She states they have a good relationship. She reports having a brother in [place] , and while they do not talk frequently, they have a good relationship. She reports rarely drinking alcohol when she goes out for social events. He denies the use of marijuana, or illegal drugs, or nicotine. Denies having children. Considers her roommate and friends support system, does volunteer work. She is adopted.   Substance Use History:   Social History   Socioeconomic History   Marital status: Divorced    Spouse name: Not on file   Number of children: Not on file   Years of education: Not on file   Highest education level: Bachelor'Kennedy degree (e.g., BA, AB, BS)  Occupational History   Occupation: Retider  Tobacco Use   Smoking status: Never    Passive exposure: Never   Smokeless tobacco: Never  Vaping Use   Vaping status: Never Used  Substance and Sexual Activity   Alcohol use: Yes    Alcohol/week: 1.0 standard drink of alcohol    Types: 1 Glasses of wine per week    Comment: has drank excessively, not drinking daily   Drug use: No   Sexual activity: Yes  Other Topics Concern   Not on file  Social History Narrative   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   Social Drivers of Research Scientist (physical Sciences) Strain: Low Risk  (05/08/2024)   Overall Financial Resource Strain (CARDIA)    Difficulty of Paying Living Expenses: Not hard at all  Food Insecurity: No Food Insecurity (05/08/2024)   Hunger Vital Sign    Worried About Running Out of Food in the Last Year: Never true    Ran Out of Food in the Last Year:  Never true  Transportation Needs: No Transportation Needs (05/08/2024)   PRAPARE - Administrator, Civil Service (Medical): No    Lack of Transportation (Non-Medical): No  Physical Activity: Inactive (05/08/2024)   Exercise Vital Sign    Days of Exercise per Week: 0 days    Minutes of Exercise per Session: 0 min  Stress: Stress Concern Present (05/08/2024)   [employer] of Occupational Health - Occupational Stress Questionnaire    Feeling of Stress: Rather much  Social Connections: Socially Isolated (05/08/2024)   Social Connection and Isolation Panel    Frequency of Communication with Friends and Family: More than three times a week    Frequency of Social Gatherings with Friends and Family: Twice a week    Attends Religious Services: Never    Database Administrator or Organizations: No    Attends Engineer, Structural: Never    Marital Status: Divorced    Allergies: No Known Allergies  Current Medications: Current Outpatient Medications  Medication Sig Dispense Refill   balsalazide (COLAZAL ) 750 MG capsule TAKE 2 CAPSULES(1500 MG) BY MOUTH THREE TIMES DAILY 540 capsule 0   busPIRone  (BUSPAR ) 15 MG tablet Take 1 tablet (15 mg total) by mouth 2 (two) times daily. 60 tablet 2   cholecalciferol (VITAMIN D3) 25 MCG (1000 UNIT) tablet Take 1,000 Units by mouth daily.     folic acid  (FOLVITE ) 1 MG tablet Take 2 mg by mouth daily.     levothyroxine  (SYNTHROID ) 50 MCG tablet TAKE 1 TABLET(50 MCG) BY MOUTH DAILY 90 tablet 1   Multiple Vitamins-Minerals (CENTRUM SILVER 50+WOMEN) TABS Take by mouth.     pantoprazole  (PROTONIX ) 40 MG tablet TAKE 1 TABLET BY  MOUTH TWICE DAILY FOR 1 TO 2 WEEKS THEN DECREASE TO ONCE DAILY. TAKE 30 MINUTES PRIOR TO MEAL 60 tablet 3   Potassium Gluconate 550 MG TABS Take 2 tablets by mouth daily.      rosuvastatin  (CRESTOR ) 5 MG tablet TAKE 1 TABLET(5 MG) BY MOUTH DAILY 90 tablet 3   Sennosides (EX-LAX PO) Take by mouth as directed.     valACYclovir  (VALTREX ) 500 MG tablet TAKE 1 TABLET(500 MG) BY MOUTH DAILY 90 tablet 1   venlafaxine  XR (EFFEXOR -XR) 150 MG 24 hr capsule TAKE 1 CAPSULE BY MOUTH EVERY MORNING WITH BREAKFAST 30 capsule 2   venlafaxine  XR (EFFEXOR -XR) 75 MG 24 hr capsule TAKE 1 CAPSULE BY MOUTH DAILY WITH BREAKFAST IN ADDITION TO 150 MG DAILY 30 capsule 2   No current facility-administered medications for this visit.    ROS: Review of Systems Respiratory:  Negative for shortness of breath.   Cardiovascular:  Negative for chest pain.  Gastrointestinal:  Negative for abdominal pain, constipation, diarrhea, nausea and vomiting.  Neurological:  Negative for headaches.    Objective:  Psychiatric Specialty Exam: Last menstrual period 10/12/2005.There is no height or weight on file to calculate BMI.  General Appearance: Fairly Groomed, has gray hair, wearing black flowy top  Eye Contact:  Good  Speech:  Clear and Coherent  Volume:  Normal  Mood:  Euthymic  Affect:  Appropriate  Thought Content: Logical   Suicidal Thoughts:  No  Homicidal Thoughts:  No  Thought Process:  Coherent  Orientation:  Full (Time, Place, and Person)    Memory: Grossly intact   Judgment:  Fair  Insight:  Fair  Concentration:  Concentration: Fair  Recall: not formally assessed   Fund of Knowledge: Fair  Language: Fair  Psychomotor Activity:  Normal  Akathisia:  No  AIMS (if indicated): not done  Assets:  Communication Skills Desire for Improvement Financial Resources/Insurance Housing Resilience Social Support Talents/Skills  ADL'Kennedy:  Intact  Cognition: WNL  Sleep:  Good   PE: General: well-appearing; no  acute distress  Pulm: no increased work of breathing on room air  Strength & Muscle Tone: within normal limits Neuro: no focal neurological deficits observed  Gait & Station: normal  Metabolic Disorder Labs: Lab Results  Component Value Date   HGBA1C 6.0 12/08/2023   MPG 108 06/05/2020   No results found for: PROLACTIN Lab Results  Component Value Date   CHOL 224 (H) 12/08/2023   TRIG 194.0 (H) 12/08/2023   HDL 67.50 12/08/2023   CHOLHDL 3 12/08/2023   VLDL 38.8 12/08/2023   LDLCALC 118 (H) 12/08/2023   LDLCALC 97 06/28/2023   Lab Results  Component Value Date   TSH 4.45 12/08/2023   TSH 0.92 06/28/2023    Therapeutic Level Labs: No results found for: LITHIUM No results found for: VALPROATE No results found for: CBMZ  Screenings:  GAD-7    Advertising Copywriter from 02/15/2024 in [facility] at [facility] Visit from 12/08/2023 in [facility] [place] Counselor from 01/26/2023 in [facility] at [facility] Office Visit from 1/75/7979 in [facility]  Total GAD-7 Score 4 8 8 11    PHQ2-9    Flowsheet Row Clinical Support from 05/08/2024 in [facility] [place] Counselor from 02/15/2024 in [facility] at [facility] Visit from 12/08/2023 in [facility] [place] Office Visit from 06/04/2023 in [facility] Clinical Support from 05/06/2023 in [facility] [place]  PHQ-2 Total Score 0 4 2 2 2   PHQ-9 Total Score 0 11 7 3 4    Flowsheet Row Counselor from 02/15/2024 in [facility] at [facility]  C-SSRS RISK CATEGORY No Risk    Collaboration of Care: Collaboration of Care: Medication Management AEB attending MD  Patient/Guardian was advised Release of Information must be obtained prior  to any record release in order to collaborate their care with an outside provider. Patient/Guardian was advised if they have not already done so to contact the registration department to sign all necessary forms in order for us  to release information regarding their care.   Consent: Patient/Guardian gives verbal consent for treatment and assignment of benefits for services provided during this visit. Patient/Guardian expressed understanding and agreed to proceed.   Corean Minor, MD, PGY-3 08/07/2024, 1:24 PM

## 2024-08-05 ENCOUNTER — Other Ambulatory Visit: Payer: Self-pay | Admitting: Gastroenterology

## 2024-08-07 ENCOUNTER — Ambulatory Visit (HOSPITAL_BASED_OUTPATIENT_CLINIC_OR_DEPARTMENT_OTHER): Admitting: Psychiatry

## 2024-08-07 DIAGNOSIS — F411 Generalized anxiety disorder: Secondary | ICD-10-CM | POA: Diagnosis not present

## 2024-08-07 DIAGNOSIS — F3341 Major depressive disorder, recurrent, in partial remission: Secondary | ICD-10-CM | POA: Diagnosis not present

## 2024-08-07 MED ORDER — BUSPIRONE HCL 15 MG PO TABS: 15.0000 mg | ORAL_TABLET | Freq: Two times a day (BID) | ORAL | 2 refills | Status: DC

## 2024-08-07 MED ORDER — VENLAFAXINE HCL ER 150 MG PO CP24: ORAL_CAPSULE | ORAL | 2 refills | Status: DC

## 2024-08-07 MED ORDER — VENLAFAXINE HCL ER 75 MG PO CP24: ORAL_CAPSULE | ORAL | 2 refills | Status: DC

## 2024-08-07 NOTE — Addendum Note (Signed)
 Addended by: CARVIN CROCK on: 08/07/2024 02:27 PM   Modules accepted: Level of Service

## 2024-08-09 DIAGNOSIS — K080 Exfoliation of teeth due to systemic causes: Secondary | ICD-10-CM | POA: Diagnosis not present

## 2024-08-22 ENCOUNTER — Ambulatory Visit (HOSPITAL_COMMUNITY): Admitting: Licensed Clinical Social Worker

## 2024-08-27 ENCOUNTER — Other Ambulatory Visit: Payer: Self-pay | Admitting: Internal Medicine

## 2024-08-29 ENCOUNTER — Other Ambulatory Visit: Payer: Self-pay | Admitting: Internal Medicine

## 2024-09-12 ENCOUNTER — Ambulatory Visit (HOSPITAL_COMMUNITY): Admitting: Licensed Clinical Social Worker

## 2024-09-12 DIAGNOSIS — F3341 Major depressive disorder, recurrent, in partial remission: Secondary | ICD-10-CM

## 2024-09-12 NOTE — Progress Notes (Signed)
   THERAPIST PROGRESS NOTE  Session Time: 1:30pm-2:15pm  Participation Level: Active  Behavioral Response: Well GroomedAlertEuthymic  Type of Therapy: Individual Therapy  Treatment Goals addressed: Manage grief   ProgressTowards Goals: Progressing  Interventions: CBT and Solution Focused  Summary: Tammy Kennedy is a 68 y.o. female who presents with MDD, recurrent, in partial remission.   Suicidal/Homicidal: Nowithout intent/plan  Therapist Response: Tammy Kennedy engaged well in individual virtual session with clinician. Clinician utilized CBT and solution-focused therapy to process thoughts, feelings, and to plan for a confrontation with ex's family. Clinician discussed approaches to these confrontations and provided feedback in order to maintain some connection, but to allow space.  Clinician provided time and space to identify changes in household. Tammy Kennedy shared that for the first time in 2 years, she has her own house. Clinician explored options for making the house her own, painting, re-organizing, etc. Clinician reflected the excitement and joy she is feeling.   Plan: Return again in 4 weeks.  Diagnosis: MDD (major depressive disorder), recurrent, in partial remission  Collaboration of Care: Patient refused AEB none required  Patient/Guardian was advised Release of Information must be obtained prior to any record release in order to collaborate their care with an outside provider. Patient/Guardian was advised if they have not already done so to contact the registration department to sign all necessary forms in order for us  to release information regarding their care.   Consent: Patient/Guardian gives verbal consent for treatment and assignment of benefits for services provided during this visit. Patient/Guardian expressed understanding and agreed to proceed.   Tammy Kennedy [place], LCSW 09/12/2024

## 2024-09-18 ENCOUNTER — Encounter (HOSPITAL_COMMUNITY): Payer: Self-pay | Admitting: Licensed Clinical Social Worker

## 2024-09-18 DIAGNOSIS — L853 Xerosis cutis: Secondary | ICD-10-CM | POA: Diagnosis not present

## 2024-09-18 DIAGNOSIS — D0462 Carcinoma in situ of skin of left upper limb, including shoulder: Secondary | ICD-10-CM | POA: Diagnosis not present

## 2024-10-22 NOTE — Progress Notes (Unsigned)
 BH MD Outpatient Progress Note  10/23/2024 3:22 PM Tammy Kennedy  MRN:  [id number]  Assessment:  Tammy Kennedy presents for follow-up evaluation. Today, 10/23/2024, patient reports euthymic mood and continued stability in her depression and anxiety. She has no issues with medications or with side effects including the dry mouth that she feels is manageable at this point. She described symptoms consistent with long-standing social anxiety disorder today including feeling judged when she is alone at social events and needing someone to go with her for example when she goes to [employer] events or as she is going to an fish farm manager. Given this we discussed starting propranolol  PRN for these situations. She reports no contraindications to propranolol  use and vital signs were stable at visit today. The risks/benefits/side-effects/alternatives to this medication were discussed in detail with the patient including risk of lightheadedness and dizziness and time was given for questions. The patient consents to medication trial.  She will also talk with her PCP regarding the risks of untreated OSA at appt in Feb. She currently reports no issues with sleep. Continued to encourage follow-up with Tammy Kennedy for therapy.  Identifying Information: Tammy Kennedy is a 69 y.o. female with a history of MDD, GAD  who is an established patient with [facility] for management of medications.  Risk Assessment: An assessment of suicide and violence risk factors was performed as part of this evaluation and is not significantly changed from the last visit.             While future psychiatric events cannot be accurately predicted, the patient does not currently require acute inpatient psychiatric care and does not currently meet [place]  involuntary commitment criteria.          Plan:  # Major depressive disorder in partial remission, generalized anxiety disorder # Social anxiety  disorder  -- Continue Effexor  225 mg daily for depression - continue buspirone  15 mg twice daily for anxiety -- start propranolol  10mg  PRN 6min-1 hour prior to situations that cause increased anxiety  - continue to see Tammy Kennedy for therapy  # OSA  -- patient declined autopap and oral appliance  -- encourage f/u with sleep doctor   # Hypothyroidism -- Last TSH wnl  Health maintenance: PCP: Tammy Glade PARAS, MD -Crohn's on colazal  -hypothyroidism on synthroid   -HLD on crestor   -OSA, declined treatment  Labs: 11/2023 CBC wnl, 11/2023 CMP wnl, 11/2023 vit D wnl. 11/2023 LDL elevated (118). 11/2023 TSH wnl. 11/2023 A1c prediabetes (6%), PDMP neg  EKG: 01/2011 Qtc 420, NSR MRI brain / EEG: none  Sleep study: OSA   Return to care in:  Future Appointments  Date Time Provider Department Center  11/07/2024  1:30 PM Tammy Tammy Kennedy SAUNDERS, Tammy Kennedy [facility] None  12/08/2024  1:00 PM Tammy Glade PARAS, MD [facility]  01/15/2025  1:00 PM Tammy Krabbe, MD [facility] None  05/09/2025  1:50 PM [facility]-ANNUAL WELLNESS VISIT [facility]    Patient was given contact information for behavioral health clinic and was instructed to call 911 for emergencies.   Patient and plan of care will be discussed with the Attending MD who agrees with the above statement and plan.   Subjective:  Chief Complaint: medication management  Interval History:  -saw Tammy Kennedy for therapy in December  Patient reports mood is pretty good, reports one episode of feeling depressed before going to bed when watching the news. Reports she watches murders and the news. She reports that she  cross stiches. She has a friend who quilts and they will go out together and will watch horror movies from the 60s. She reports she had an epsiode of increased anxiety when she was going somewhere and into a social situation, she will go with someone else which helps her. She reports that she also uses visualization for increased  anxiety. She feels that the medications have helped lower the anxiety. She reports day to day she feels that the anxiety is manageable. She reports when going to new places she may have some worries about being judged and that she will be alone and won't know anyone and is worried about standing there alone. We discussed starting propranolol  PRN for social anxiety. She denies history of asthma. She reports no changes with sleep or appetite. Patient reports adherence with medications. Patient reports no side effects.Patient reports no new substance use. Patient denies SI/HI/AVH.   Visit Diagnosis:    ICD-10-CM   1. MDD (major depressive disorder), recurrent, in partial remission  F33.41 venlafaxine  XR (EFFEXOR -XR) 150 MG 24 hr capsule    venlafaxine  XR (EFFEXOR -XR) 75 MG 24 hr capsule    2. GAD (generalized anxiety disorder)  F41.1 busPIRone  (BUSPAR ) 15 MG tablet    3. Social anxiety disorder  F40.10 propranolol  (INDERAL ) 10 MG tablet      Past Psychiatric History:  Diagnoses: MDD, GAD Medication trials:   Current: effexor , buspar   Past: effexor , remeron  (twitching finger), abilify , buspar  Previous psychiatrist/therapist: Dr. Marry Kennedy  Hospitalizations: intensive outpatient 2006 for issues with alcohol. Suicide attempts: denied SIB: denied Hx of violence towards others: denied  Current access to guns: denied  Hx of trauma/abuse: denied   Initial note: per Tammy Kennedy is a 69 y.o. female with a history of anxiety who presents in [facility] for initial evaluation of possible side effects with Abilify .  The patient reports taking Effexor  for approximately 20 years and feels that the medication has worked well for her.  She states that Abilify  was added in October 2023, several months after the loss of her former romantic partner of 10 years.  In the months following the initiation of Abilify , she reports gaining approximately 10  pounds (which she feels is a lot given her short stature) as well as having increased dizziness.  She also reports sleeping excessively during the day, which she wonders might also be related to the Abilify .  Because we have not followed the patient longitudinally, it is difficult to say with certainty whether or not these 3 side effects (weight gain, dizziness, and somnolence) are due to the Abilify .  However, it is very possible that all 3 of them are, and because the patient's psychiatric symptomatology is relatively mild at this time (per her report and appearance on exam), it makes sense to reduce the dose of Abilify  from 5 mg daily to 2 mg daily, considering that as patients become older they are more sensitive to even what are only considered low doses of medication such as Abilify .  The patient does not desire to continue seeing psychiatry.  For now, we will prescribe the 2 mg tablet for 30 days and let her return to her primary care physician for further care.  We would be more than happy to see Ms Toepfer back in our clinic should she desire.   Substance Use History: denied Reports used cocaine in the past and alcohol  Still drinks socially: 2-3 drinks once a week, last drank  on Saturday  Denies history of alcohol withdrawal seizures or hospitalizations  Cocaine has been around 18 years ago  Past Medical History:  Past Medical History:  Diagnosis Date   Alcohol abuse 05/14/2016   ALCOHOL ABUSE, HX OF    ANEMIA    ANXIETY DISORDER    Basal cell carcinoma (BCC) 01/10/2018   C. difficile colitis    CROHN'S DISEASE    DEPRESSION    DRY EYE SYNDROME    HEPATOMEGALY, HX OF    HYPERLIPIDEMIA    intol of statin trials   HYPOTHYROIDISM    OSTEOPENIA    Osteoporosis 05/2017   states Pre osteoporosis 8/18   Rectovaginal fistula 2012   chron's disease   Tubular adenoma 2013    Past Surgical History:  Procedure Laterality Date   ANAL FISSURE REPAIR     CATARACT EXTRACTION     EYE SURGERY   11/2017   macular pucker   HEMORRHOID SURGERY     IR RADIOLOGIST EVAL & MGMT  08/05/2021   Left thumb surgery  1989   MOHS SURGERY  2008   Nose BCC   Theraputic abortion     TOTAL HIP ARTHROPLASTY Right    09/2021   Contraception: post-menopausal  Family Psychiatric History: None pertinent   Family History:  Family History  Adopted: Yes  Problem Relation Age of Onset   Breast cancer Neg Hx    Social History:  Reports she has a bachelor's degree in counselling psychologist, focused on graphite and print making  The patient reports that she is presently retired, starting in 2019. 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 She states that her former romantic partner of 10 years, and housemate at the time, was popping pills and either accidentally or intentionally overdose and denied. She reports that this has been difficult for her and is why she started counseling. Presently she states that she lives with a 69 year old female, and has been staying with this person for the past year. She states they have a good relationship. She reports having a brother in [place] , and while they do not talk frequently, they have a good relationship. She reports rarely drinking alcohol when she goes out for social events. He denies the use of marijuana, or illegal drugs, or nicotine. Denies having children. Considers her roommate and friends support system, does volunteer work. She is adopted.   Substance Use History:   Social History   Socioeconomic History   Marital status: Divorced    Spouse name: Not on file   Number of children: Not on file   Years of education: Not on file   Highest education level: Bachelor's degree (e.g., BA, AB, BS)  Occupational History   Occupation: Retider  Tobacco Use   Smoking status: Never    Passive exposure: Never   Smokeless tobacco:  Never  Vaping Use   Vaping status: Never Used  Substance and Sexual Activity   Alcohol use: Yes    Alcohol/week: 1.0 standard drink of alcohol    Types: 1 Glasses of wine per week    Comment: has drank excessively, not drinking daily   Drug use: No   Sexual activity: Yes  Other Topics Concern   Not on file  Social History Narrative   Works at [employer]- part time   Social Drivers of Health  Tobacco Use: Low Risk (09/18/2024)   Patient History    Smoking Tobacco Use: Never    Smokeless Tobacco Use: Never    Passive Exposure: Never  Financial Resource Strain: Low Risk (05/08/2024)   Overall Financial Resource Strain (CARDIA)    Difficulty of Paying Living Expenses: Not hard at all  Food Insecurity: No Food Insecurity (05/08/2024)   Epic    Worried About Radiation Protection Practitioner of Food in the Last Year: Never true    Ran Out of Food in the Last Year: Never true  Transportation Needs: No Transportation Needs (05/08/2024)   Epic    Lack of Transportation (Medical): No    Lack of Transportation (Non-Medical): No  Physical Activity: Inactive (05/08/2024)   Exercise Vital Sign    Days of Exercise per Week: 0 days    Minutes of Exercise per Session: 0 min  Stress: Stress Concern Present (05/08/2024)   [employer] of Occupational Health - Occupational Stress Questionnaire    Feeling of Stress: Rather much  Social Connections: Socially Isolated (05/08/2024)   Social Connection and Isolation Panel    Frequency of Communication with Friends and Family: More than three times a week    Frequency of Social Gatherings with Friends and Family: Twice a week    Attends Religious Services: Never    Database Administrator or Organizations: No    Attends Banker Meetings: Never    Marital Status: Divorced  Depression (PHQ2-9): Low Risk (05/08/2024)   Depression (PHQ2-9)    PHQ-2 Score: 0  Recent Concern: Depression (PHQ2-9) - High Risk (02/15/2024)    Depression (PHQ2-9)    PHQ-2 Score: 11  Alcohol Screen: Low Risk (05/08/2024)   Alcohol Screen    Last Alcohol Screening Score (AUDIT): 1  Housing: Low Risk (05/08/2024)   Epic    Unable to Pay for Housing in the Last Year: No    Number of Times Moved in the Last Year: 0    Homeless in the Last Year: No  Utilities: Not At Risk (05/08/2024)   Epic    Threatened with loss of utilities: No  Health Literacy: Adequate Health Literacy (05/08/2024)   B1300 Health Literacy    Frequency of need for help with medical instructions: Never    Allergies: No Known Allergies  Current Medications: Current Outpatient Medications  Medication Sig Dispense Refill   propranolol  (INDERAL ) 10 MG tablet Take 1 tablet (10 mg total) by mouth daily as needed (anxiety, panic attacks, sleep). 30 tablet 0   balsalazide (COLAZAL ) 750 MG capsule TAKE 2 CAPSULES(1500 MG) BY MOUTH THREE TIMES DAILY 540 capsule 0   busPIRone  (BUSPAR ) 15 MG tablet Take 1 tablet (15 mg total) by mouth 2 (two) times daily. 180 tablet 0   cholecalciferol (VITAMIN D3) 25 MCG (1000 UNIT) tablet Take 1,000 Units by mouth daily.     folic acid  (FOLVITE ) 1 MG tablet Take 2 mg by mouth daily.     levothyroxine  (SYNTHROID ) 50 MCG tablet TAKE 1 TABLET(50 MCG) BY MOUTH DAILY 90 tablet 1   Multiple Vitamins-Minerals (CENTRUM SILVER 50+WOMEN) TABS Take by mouth.     pantoprazole  (PROTONIX ) 40 MG tablet TAKE 1 TABLET BY MOUTH TWICE DAILY FOR 1 TO 2 WEEKS THEN DECREASE TO ONCE DAILY. TAKE 30 MINUTES PRIOR TO MEAL 60 tablet 3   Potassium Gluconate 550 MG TABS Take 2 tablets by mouth daily.      rosuvastatin  (CRESTOR ) 5 MG tablet TAKE 1 TABLET(5 MG) BY MOUTH DAILY 90  tablet 3   Sennosides (EX-LAX PO) Take by mouth as directed.     valACYclovir  (VALTREX ) 500 MG tablet TAKE 1 TABLET(500 MG) BY MOUTH DAILY 90 tablet 1   venlafaxine  XR (EFFEXOR -XR) 150 MG 24 hr capsule TAKE 1 CAPSULE BY MOUTH EVERY MORNING WITH BREAKFAST 90 capsule 0   venlafaxine  XR  (EFFEXOR -XR) 75 MG 24 hr capsule TAKE 1 CAPSULE BY MOUTH DAILY WITH BREAKFAST IN ADDITION TO 150 MG DAILY 90 capsule 2   No current facility-administered medications for this visit.    ROS: Review of Systems Respiratory:  Negative for shortness of breath.   Cardiovascular:  Negative for chest pain.  Gastrointestinal:  Negative for abdominal pain, constipation, diarrhea, nausea and vomiting.  Neurological:  Negative for headaches.    Objective:  Psychiatric Specialty Exam: Blood pressure 107/71, pulse 80, last menstrual period 10/12/2005.There is no height or weight on file to calculate BMI.  General Appearance: Fairly Groomed, has gray hair  Eye Contact:  Good  Speech:  Clear and Coherent  Volume:  Normal  Mood:  Euthymic  Affect:  Appropriate  Thought Content: Logical   Suicidal Thoughts:  No  Homicidal Thoughts:  No  Thought Process:  Coherent  Orientation:  Full (Time, Place, and Person)    Memory: Grossly intact   Judgment:  Fair  Insight:  Fair  Concentration:  Concentration: Fair  Recall: not formally assessed   Fund of Knowledge: Fair  Language: Fair  Psychomotor Activity:  Normal  Akathisia:  No  AIMS (if indicated): not done  Assets:  Communication Skills Desire for Improvement Financial Resources/Insurance Housing Resilience Social Support Talents/Skills  ADL's:  Intact  Cognition: WNL  Sleep:  Good   PE: General: well-appearing; no acute distress  Pulm: no increased work of breathing on room air  Strength & Muscle Tone: within normal limits Neuro: no focal neurological deficits observed  Gait & Station: normal  Metabolic Disorder Labs: Lab Results  Component Value Date   HGBA1C 6.0 12/08/2023   MPG 108 06/05/2020   No results found for: PROLACTIN Lab Results  Component Value Date   CHOL 224 (H) 12/08/2023   TRIG 194.0 (H) 12/08/2023   HDL 67.50 12/08/2023   CHOLHDL 3 12/08/2023   VLDL 38.8 12/08/2023   LDLCALC 118 (H) 12/08/2023    LDLCALC 97 06/28/2023   Lab Results  Component Value Date   TSH 4.45 12/08/2023   TSH 0.92 06/28/2023    Therapeutic Level Labs: No results found for: LITHIUM No results found for: VALPROATE No results found for: CBMZ  Screenings:  GAD-7    Flowsheet Row Counselor from 02/15/2024 in [facility] at [facility] Visit from 12/08/2023 in [facility] Counselor from 01/26/2023 in [facility] at [facility] Office Visit from 1/75/7979 in [facility]  Total GAD-7 Score 4 8 8 11    PHQ2-9    Flowsheet Row Clinical Support from 05/08/2024 in [facility] [place] Counselor from 02/15/2024 in [facility] Visit from 12/08/2023 in [facility] Office Visit from 06/04/2023 in [facility] Clinical Support from 05/06/2023 in [facility]  PHQ-2 Total Score 0 4 2 2 2   PHQ-9 Total Score 0 11 7 3 4    Flowsheet Row Counselor from 02/15/2024 in [facility]  at [facility] RISK CATEGORY No Risk    Collaboration of Care: Collaboration of Care: Medication Management AEB attending MD  Patient/Guardian was advised Release of Information must be obtained prior to any record release in order to collaborate their care with an outside provider. Patient/Guardian was advised if they have not already done so to contact the registration department to sign all necessary forms in order for us  to release information regarding their care.   Consent: Patient/Guardian gives verbal consent for treatment and assignment of benefits for services provided during this visit. Patient/Guardian expressed understanding and agreed to proceed.   Corean Minor, MD, PGY-3 10/23/2024, 3:22 PM

## 2024-10-23 ENCOUNTER — Ambulatory Visit (HOSPITAL_COMMUNITY): Admitting: Psychiatry

## 2024-10-23 ENCOUNTER — Other Ambulatory Visit (HOSPITAL_COMMUNITY): Payer: Self-pay | Admitting: Psychiatry

## 2024-10-23 VITALS — BP 107/71 | HR 80

## 2024-10-23 DIAGNOSIS — F4010 Social phobia, unspecified: Secondary | ICD-10-CM

## 2024-10-23 DIAGNOSIS — F411 Generalized anxiety disorder: Secondary | ICD-10-CM

## 2024-10-23 DIAGNOSIS — F3341 Major depressive disorder, recurrent, in partial remission: Secondary | ICD-10-CM | POA: Diagnosis not present

## 2024-10-23 MED ORDER — VENLAFAXINE HCL ER 75 MG PO CP24: ORAL_CAPSULE | ORAL | 2 refills | Status: AC

## 2024-10-23 MED ORDER — BUSPIRONE HCL 15 MG PO TABS: 15.0000 mg | ORAL_TABLET | Freq: Two times a day (BID) | ORAL | 0 refills | Status: AC

## 2024-10-23 MED ORDER — VENLAFAXINE HCL ER 150 MG PO CP24: ORAL_CAPSULE | ORAL | 0 refills | Status: AC

## 2024-10-23 MED ORDER — PROPRANOLOL HCL 10 MG PO TABS: 10.0000 mg | ORAL_TABLET | Freq: Every day | ORAL | 0 refills | Status: DC | PRN

## 2024-10-23 NOTE — Telephone Encounter (Signed)
 Patient requested 90 day supply, this was sent

## 2024-10-24 NOTE — Addendum Note (Signed)
 Addended by: CARVIN CROCK on: 10/24/2024 07:53 AM   Modules accepted: Level of Service

## 2024-11-07 ENCOUNTER — Ambulatory Visit (HOSPITAL_COMMUNITY): Admitting: Licensed Clinical Social Worker

## 2024-11-17 ENCOUNTER — Other Ambulatory Visit: Payer: Self-pay | Admitting: Internal Medicine

## 2024-11-28 ENCOUNTER — Ambulatory Visit (HOSPITAL_COMMUNITY): Admitting: Licensed Clinical Social Worker

## 2024-12-08 ENCOUNTER — Encounter: Admitting: Internal Medicine

## 2025-01-15 ENCOUNTER — Ambulatory Visit (HOSPITAL_COMMUNITY): Admitting: Psychiatry

## 2025-05-09 ENCOUNTER — Ambulatory Visit
